# Patient Record
Sex: Female | Born: 1955 | Race: Black or African American | Hispanic: No | Marital: Married | Smoking: Former smoker
Health system: Southern US, Community
[De-identification: ages and names within clinical notes are randomized; demographics above are authoritative.]

## PROBLEM LIST (undated history)

## (undated) DIAGNOSIS — I10 Essential (primary) hypertension: Secondary | ICD-10-CM

## (undated) DIAGNOSIS — I517 Cardiomegaly: Secondary | ICD-10-CM

## (undated) DIAGNOSIS — K469 Unspecified abdominal hernia without obstruction or gangrene: Secondary | ICD-10-CM

## (undated) DIAGNOSIS — C189 Malignant neoplasm of colon, unspecified: Principal | ICD-10-CM

## (undated) DIAGNOSIS — C801 Malignant (primary) neoplasm, unspecified: Secondary | ICD-10-CM

## (undated) DIAGNOSIS — I428 Other cardiomyopathies: Secondary | ICD-10-CM

## (undated) DIAGNOSIS — E049 Nontoxic goiter, unspecified: Secondary | ICD-10-CM

## (undated) DIAGNOSIS — D649 Anemia, unspecified: Secondary | ICD-10-CM

## (undated) HISTORY — DX: Malignant neoplasm of colon, unspecified: C18.9

## (undated) HISTORY — PX: WISDOM TOOTH EXTRACTION: SHX21

---

## 2001-03-16 ENCOUNTER — Encounter: Payer: Self-pay | Admitting: *Deleted

## 2001-03-16 ENCOUNTER — Ambulatory Visit (HOSPITAL_COMMUNITY): Admission: RE | Admit: 2001-03-16 | Discharge: 2001-03-16 | Payer: Self-pay | Admitting: Family Medicine

## 2001-03-16 ENCOUNTER — Emergency Department (HOSPITAL_COMMUNITY): Admission: EM | Admit: 2001-03-16 | Discharge: 2001-03-16 | Payer: Self-pay | Admitting: *Deleted

## 2001-03-16 ENCOUNTER — Inpatient Hospital Stay (HOSPITAL_COMMUNITY): Admission: EM | Admit: 2001-03-16 | Discharge: 2001-03-21 | Payer: Self-pay | Admitting: Emergency Medicine

## 2001-03-16 ENCOUNTER — Encounter: Payer: Self-pay | Admitting: Family Medicine

## 2001-04-05 ENCOUNTER — Encounter: Admission: RE | Admit: 2001-04-05 | Discharge: 2001-07-04 | Payer: Self-pay | Admitting: *Deleted

## 2001-08-21 ENCOUNTER — Encounter: Payer: Self-pay | Admitting: *Deleted

## 2001-08-21 ENCOUNTER — Emergency Department (HOSPITAL_COMMUNITY): Admission: EM | Admit: 2001-08-21 | Discharge: 2001-08-21 | Payer: Self-pay | Admitting: *Deleted

## 2005-08-01 ENCOUNTER — Emergency Department (HOSPITAL_COMMUNITY): Admission: EM | Admit: 2005-08-01 | Discharge: 2005-08-01 | Payer: Self-pay | Admitting: Emergency Medicine

## 2007-10-03 ENCOUNTER — Ambulatory Visit: Payer: Self-pay | Admitting: Internal Medicine

## 2007-10-03 DIAGNOSIS — I509 Heart failure, unspecified: Secondary | ICD-10-CM | POA: Insufficient documentation

## 2007-10-03 DIAGNOSIS — F329 Major depressive disorder, single episode, unspecified: Secondary | ICD-10-CM

## 2007-10-03 DIAGNOSIS — J309 Allergic rhinitis, unspecified: Secondary | ICD-10-CM | POA: Insufficient documentation

## 2007-10-03 DIAGNOSIS — I10 Essential (primary) hypertension: Secondary | ICD-10-CM | POA: Insufficient documentation

## 2007-10-03 DIAGNOSIS — K219 Gastro-esophageal reflux disease without esophagitis: Secondary | ICD-10-CM

## 2007-10-03 DIAGNOSIS — I428 Other cardiomyopathies: Secondary | ICD-10-CM

## 2007-10-03 DIAGNOSIS — M545 Low back pain: Secondary | ICD-10-CM

## 2007-10-03 DIAGNOSIS — E049 Nontoxic goiter, unspecified: Secondary | ICD-10-CM | POA: Insufficient documentation

## 2007-10-03 DIAGNOSIS — J329 Chronic sinusitis, unspecified: Secondary | ICD-10-CM | POA: Insufficient documentation

## 2007-10-03 DIAGNOSIS — F411 Generalized anxiety disorder: Secondary | ICD-10-CM | POA: Insufficient documentation

## 2007-10-03 LAB — CONVERTED CEMR LAB
Blood Glucose, Fingerstick: 114
Creatinine, Urine: 39.7 mg/dL
Hgb A1c MFr Bld: 9.9 %
Microalb, Ur: 0.2 mg/dL (ref 0.00–1.89)

## 2007-10-04 ENCOUNTER — Telehealth (INDEPENDENT_AMBULATORY_CARE_PROVIDER_SITE_OTHER): Payer: Self-pay | Admitting: *Deleted

## 2007-10-04 ENCOUNTER — Encounter (INDEPENDENT_AMBULATORY_CARE_PROVIDER_SITE_OTHER): Payer: Self-pay | Admitting: Internal Medicine

## 2007-10-04 LAB — CONVERTED CEMR LAB
ALT: 15 units/L (ref 0–35)
Alkaline Phosphatase: 61 units/L (ref 39–117)
Basophils Absolute: 0 10*3/uL (ref 0.0–0.1)
CO2: 24 meq/L (ref 19–32)
Cholesterol: 213 mg/dL — ABNORMAL HIGH (ref 0–200)
Eosinophils Relative: 2 % (ref 0–5)
HCT: 40.8 % (ref 36.0–46.0)
LDL Cholesterol: 117 mg/dL — ABNORMAL HIGH (ref 0–99)
Lymphocytes Relative: 48 % — ABNORMAL HIGH (ref 12–46)
Neutro Abs: 4.4 10*3/uL (ref 1.7–7.7)
Neutrophils Relative %: 45 % (ref 43–77)
Platelets: 246 10*3/uL (ref 150–400)
RDW: 13.8 % (ref 11.5–15.5)
Sodium: 143 meq/L (ref 135–145)
Total Bilirubin: 0.3 mg/dL (ref 0.3–1.2)
Total Protein: 7.3 g/dL (ref 6.0–8.3)
VLDL: 24 mg/dL (ref 0–40)

## 2007-11-07 ENCOUNTER — Ambulatory Visit (HOSPITAL_COMMUNITY): Admission: RE | Admit: 2007-11-07 | Discharge: 2007-11-07 | Payer: Self-pay | Admitting: Internal Medicine

## 2007-11-08 ENCOUNTER — Telehealth (INDEPENDENT_AMBULATORY_CARE_PROVIDER_SITE_OTHER): Payer: Self-pay | Admitting: *Deleted

## 2007-11-09 ENCOUNTER — Encounter (INDEPENDENT_AMBULATORY_CARE_PROVIDER_SITE_OTHER): Payer: Self-pay | Admitting: Internal Medicine

## 2007-11-09 ENCOUNTER — Telehealth (INDEPENDENT_AMBULATORY_CARE_PROVIDER_SITE_OTHER): Payer: Self-pay | Admitting: *Deleted

## 2007-11-11 ENCOUNTER — Telehealth (INDEPENDENT_AMBULATORY_CARE_PROVIDER_SITE_OTHER): Payer: Self-pay | Admitting: *Deleted

## 2007-11-11 ENCOUNTER — Encounter (INDEPENDENT_AMBULATORY_CARE_PROVIDER_SITE_OTHER): Payer: Self-pay | Admitting: Internal Medicine

## 2007-11-25 ENCOUNTER — Encounter (INDEPENDENT_AMBULATORY_CARE_PROVIDER_SITE_OTHER): Payer: Self-pay | Admitting: Diagnostic Radiology

## 2007-11-25 ENCOUNTER — Ambulatory Visit (HOSPITAL_COMMUNITY): Admission: RE | Admit: 2007-11-25 | Discharge: 2007-11-25 | Payer: Self-pay | Admitting: Internal Medicine

## 2007-11-25 ENCOUNTER — Encounter (INDEPENDENT_AMBULATORY_CARE_PROVIDER_SITE_OTHER): Payer: Self-pay | Admitting: Internal Medicine

## 2007-11-25 ENCOUNTER — Ambulatory Visit (HOSPITAL_COMMUNITY): Admission: RE | Admit: 2007-11-25 | Discharge: 2007-11-25 | Payer: Self-pay | Admitting: Diagnostic Radiology

## 2008-01-03 ENCOUNTER — Ambulatory Visit: Payer: Self-pay | Admitting: Internal Medicine

## 2008-01-04 ENCOUNTER — Encounter (INDEPENDENT_AMBULATORY_CARE_PROVIDER_SITE_OTHER): Payer: Self-pay | Admitting: Internal Medicine

## 2008-01-06 ENCOUNTER — Encounter (INDEPENDENT_AMBULATORY_CARE_PROVIDER_SITE_OTHER): Payer: Self-pay | Admitting: Internal Medicine

## 2008-02-01 ENCOUNTER — Encounter (INDEPENDENT_AMBULATORY_CARE_PROVIDER_SITE_OTHER): Payer: Self-pay | Admitting: Internal Medicine

## 2008-03-27 ENCOUNTER — Ambulatory Visit: Payer: Self-pay | Admitting: Internal Medicine

## 2008-03-27 LAB — CONVERTED CEMR LAB
Blood Glucose, Fingerstick: 259
Hgb A1c MFr Bld: 8.2 %

## 2008-06-27 ENCOUNTER — Other Ambulatory Visit: Admission: RE | Admit: 2008-06-27 | Discharge: 2008-06-27 | Payer: Self-pay | Admitting: Internal Medicine

## 2008-06-27 ENCOUNTER — Encounter (INDEPENDENT_AMBULATORY_CARE_PROVIDER_SITE_OTHER): Payer: Self-pay | Admitting: Internal Medicine

## 2008-06-27 ENCOUNTER — Ambulatory Visit: Payer: Self-pay | Admitting: Internal Medicine

## 2008-06-27 LAB — CONVERTED CEMR LAB
Blood Glucose, Fingerstick: 197
Hgb A1c MFr Bld: 8.3 %
OCCULT 1: NEGATIVE

## 2008-06-29 ENCOUNTER — Encounter (INDEPENDENT_AMBULATORY_CARE_PROVIDER_SITE_OTHER): Payer: Self-pay | Admitting: Internal Medicine

## 2008-06-29 LAB — CONVERTED CEMR LAB
ALT: 17 units/L (ref 0–35)
AST: 11 units/L (ref 0–37)
Creatinine, Ser: 0.68 mg/dL (ref 0.40–1.20)
Total Bilirubin: 0.3 mg/dL (ref 0.3–1.2)
Total CHOL/HDL Ratio: 2.5
VLDL: 14 mg/dL (ref 0–40)

## 2008-08-03 ENCOUNTER — Encounter (INDEPENDENT_AMBULATORY_CARE_PROVIDER_SITE_OTHER): Payer: Self-pay | Admitting: Internal Medicine

## 2008-08-24 ENCOUNTER — Telehealth (INDEPENDENT_AMBULATORY_CARE_PROVIDER_SITE_OTHER): Payer: Self-pay | Admitting: *Deleted

## 2008-09-26 ENCOUNTER — Ambulatory Visit: Payer: Self-pay | Admitting: Internal Medicine

## 2008-09-26 LAB — CONVERTED CEMR LAB
Blood Glucose, Fingerstick: 241
Hgb A1c MFr Bld: 8.1 %

## 2008-12-31 ENCOUNTER — Ambulatory Visit: Payer: Self-pay | Admitting: Internal Medicine

## 2009-04-02 ENCOUNTER — Ambulatory Visit: Payer: Self-pay | Admitting: Internal Medicine

## 2009-04-02 LAB — CONVERTED CEMR LAB: Hgb A1c MFr Bld: 8 %

## 2009-04-03 ENCOUNTER — Encounter (INDEPENDENT_AMBULATORY_CARE_PROVIDER_SITE_OTHER): Payer: Self-pay | Admitting: Internal Medicine

## 2009-04-03 LAB — CONVERTED CEMR LAB
ALT: 14 units/L (ref 0–35)
AST: 10 units/L (ref 0–37)
Basophils Absolute: 0 10*3/uL (ref 0.0–0.1)
CO2: 24 meq/L (ref 19–32)
Chloride: 103 meq/L (ref 96–112)
Cholesterol: 189 mg/dL (ref 0–200)
Creatinine, Urine: 244.5 mg/dL
Free T4: 1.06 ng/dL (ref 0.80–1.80)
HCT: 38.6 % (ref 36.0–46.0)
LDL Cholesterol: 108 mg/dL — ABNORMAL HIGH (ref 0–99)
Lymphocytes Relative: 49 % — ABNORMAL HIGH (ref 12–46)
Lymphs Abs: 3.7 10*3/uL (ref 0.7–4.0)
Microalb Creat Ratio: 57.2 mg/g — ABNORMAL HIGH (ref 0.0–30.0)
Neutro Abs: 3.3 10*3/uL (ref 1.7–7.7)
Neutrophils Relative %: 43 % (ref 43–77)
Platelets: 228 10*3/uL (ref 150–400)
RDW: 14.3 % (ref 11.5–15.5)
Sodium: 141 meq/L (ref 135–145)
TSH: 1.354 microintl units/mL (ref 0.350–4.500)
Total Bilirubin: 0.3 mg/dL (ref 0.3–1.2)
Total Protein: 6.9 g/dL (ref 6.0–8.3)
VLDL: 16 mg/dL (ref 0–40)
WBC: 7.6 10*3/uL (ref 4.0–10.5)

## 2009-04-17 ENCOUNTER — Encounter (INDEPENDENT_AMBULATORY_CARE_PROVIDER_SITE_OTHER): Payer: Self-pay | Admitting: Internal Medicine

## 2009-05-01 ENCOUNTER — Ambulatory Visit: Payer: Self-pay | Admitting: Internal Medicine

## 2009-05-01 DIAGNOSIS — E1129 Type 2 diabetes mellitus with other diabetic kidney complication: Secondary | ICD-10-CM

## 2009-05-01 DIAGNOSIS — E1165 Type 2 diabetes mellitus with hyperglycemia: Secondary | ICD-10-CM

## 2009-05-01 LAB — CONVERTED CEMR LAB: Blood Glucose, Fingerstick: 209

## 2009-05-29 ENCOUNTER — Ambulatory Visit: Payer: Self-pay | Admitting: Internal Medicine

## 2009-05-29 DIAGNOSIS — L03039 Cellulitis of unspecified toe: Secondary | ICD-10-CM

## 2009-06-18 ENCOUNTER — Encounter (INDEPENDENT_AMBULATORY_CARE_PROVIDER_SITE_OTHER): Payer: Self-pay | Admitting: Internal Medicine

## 2010-11-30 ENCOUNTER — Encounter: Payer: Self-pay | Admitting: Internal Medicine

## 2011-03-24 NOTE — Procedures (Signed)
NAMEDEONDRA, Allen                 ACCOUNT NO.:  [id number]   MEDICAL RECORD NO.:  [id number]          PATIENT TYPE:  OUT   LOCATION:  RAD                           FACILITY:  [facility]   PHYSICIAN:  Darlin Priestly, MD  DATE OF BIRTH:  [birth year]   DATE OF PROCEDURE:  11/25/2007  DATE OF DISCHARGE:                                ECHOCARDIOGRAM   PROCEDURE:  A 2-D echocardiogram.   INDICATIONS:  Monica Allen is a 55 year old female patient of Dr. Jen Mow  and  Dr. Daphene Jaeger with a history of hypertension and cardiomyopathy, now  referred for 2-D echocardiogram to evaluate LV function and valvular  structures.   The aorta is within normal at a 2.7 cm.   The left atrium is mildly enlarged to 4.9 cm.  No clots seen.  The  patient was in sinus rhythm during the procedure.   IVS and PD LV are mildly thickened at 1.4 and 1.2 cm respectfully.   The aortic valve appears to be structurally normal.   The mitral valve appears to be structurally normal.   The tricuspid valve appears to be structurally normal with trivial  tricuspid regurgitation.   Grossly normal pulmonic valve with mild pulmonic regurgitation.   Left ventricular internal dimensions are within normal limits of 4.8 and  3.9 cm trajectory.  There appears to be good overall left ventricular  function with estimated EF of approximately 55% with no segmental wall  motion abnormalities visualized.   Mildly dilated right ventricle with normal RVF function.   CONCLUSION:  1. Mild concentric LVH with normal LV systolic function.  2. Normal LV size and systolic function, estimated EF of 55%, with no      segmental wall motion abnormalities visualized.  3. Structurally normal aortic valve.  4. Resection of the mitral valve.  5. Structurally normal tricuspid valve with trivial tricuspid      regurgitation.  6. Mild pulmonic regurgitation.  7. Left atrial enlargement.  8. Mildly dilated right ventricle with normal RV systolic  function.      Darlin Priestly, MD  Electronically Signed     RHM/MEDQ  D:  11/25/2007  T:  11/25/2007  Job:  [id number]

## 2011-03-27 NOTE — Cardiovascular Report (Signed)
[place]. [facility]  Patient:    Monica Allen, Monica Allen                        MRN: [id number] Proc. Date: 03/18/01 Adm. Date:  [id number] Attending:  Virgina Evener CC:         John Giovanni, M.D.  Wilson Singer (office of Dr. Tresa Endo)   Cardiac Catheterization  PROCEDURE:  Left and right heart catheterization.  INDICATIONS:  Ms. Jillane Po is a 55 year old obese black female who was hospitalized with increasing shortness of breath.  Upon presentation to [facility], her blood pressure was 230/130.  A chest CT was negative for pulmonary embolism but showed cardiac enlargement.  She was hospitalized at [facility].  She was initially treated with IV labetalol as well as IV Lasix and begun on ACE inhibitor.  An echocardiogram suggested an ejection fraction in the 15-20% range.  She is now referred for definitive right and left heart catheterization.  PROCEDURE:  Right and left heart catheterization:  Cine angiography; biplane cine left ventriculography; Swan-Ganz catheterization, cardiac output determination by the Fick and thermodilution method; distal aortography.  HEMODYNAMIC DATA:  Right atrial pressure 24, right ventricular pressure 70/20. Pulmonary artery pressure 70/48-50.  Mean capillary wedge pressure is 30. Central aortic pressure was 167/109.  Left ventricular pressure was 167/30.  The O2 saturation in the left ventricle was 95%, in the pulmonary artery was 75%.  Using a hemoglobin of 14.9, cardiac output by the Fick method was 7.3 liters per minute and cardiac index was 3.2 liters per m sq.  By the thermodilution method, cardiac output was 7.3 liters per minute and the index was 3.2 liters per minute per m sq.  ANGIOGRAPHIC DATA:  The left main coronary artery was normal vessel that bifurcated into an LAD and left circumflex system.  The LAD was a large vessel that gave rise to two prominent diagonal vessels and several  septal perforator arteries and was angiographically normal.  The circumflex vessel was angiographically normal and was a dominant vessel ending in the PDA and posterior lateral system.  The right coronary artery was an angiographically normal vessel that ended in the small PD, acute margin and PL vessel.  Biplane cine left ventriculography revealed a dilated left ventricle.  There was severe global hypocontractility.  Ejection fraction is 20-25%.  Distal aortography was done.  There was no evidence for renal artery stenosis. There was no significant aortoiliac disease.  IMPRESSION: 1. Severe dilated nonischemic cardiomyopathy (ejection fraction 20-25%) 2. Normal coronary arteries. DD:  03/18/01 TD:  03/19/01 Job: [id number]

## 2011-10-11 ENCOUNTER — Encounter: Payer: Self-pay | Admitting: *Deleted

## 2011-10-11 ENCOUNTER — Emergency Department (HOSPITAL_COMMUNITY)
Admission: EM | Admit: 2011-10-11 | Discharge: 2011-10-11 | Disposition: A | Discharge status: Home / Self Care | Payer: BC Managed Care – PPO | Attending: Emergency Medicine | Admitting: Emergency Medicine

## 2011-10-11 ENCOUNTER — Emergency Department (HOSPITAL_COMMUNITY): Payer: BC Managed Care – PPO

## 2011-10-11 DIAGNOSIS — E119 Type 2 diabetes mellitus without complications: Secondary | ICD-10-CM | POA: Insufficient documentation

## 2011-10-11 DIAGNOSIS — R1013 Epigastric pain: Secondary | ICD-10-CM | POA: Insufficient documentation

## 2011-10-11 DIAGNOSIS — I10 Essential (primary) hypertension: Secondary | ICD-10-CM | POA: Insufficient documentation

## 2011-10-11 DIAGNOSIS — K859 Acute pancreatitis without necrosis or infection, unspecified: Secondary | ICD-10-CM

## 2011-10-11 DIAGNOSIS — I517 Cardiomegaly: Secondary | ICD-10-CM | POA: Insufficient documentation

## 2011-10-11 DIAGNOSIS — Z79899 Other long term (current) drug therapy: Secondary | ICD-10-CM | POA: Insufficient documentation

## 2011-10-11 DIAGNOSIS — Z87891 Personal history of nicotine dependence: Secondary | ICD-10-CM | POA: Insufficient documentation

## 2011-10-11 DIAGNOSIS — Z7982 Long term (current) use of aspirin: Secondary | ICD-10-CM | POA: Insufficient documentation

## 2011-10-11 HISTORY — DX: Essential (primary) hypertension: I10

## 2011-10-11 HISTORY — DX: Cardiomegaly: I51.7

## 2011-10-11 LAB — COMPREHENSIVE METABOLIC PANEL
AST: 13 U/L (ref 0–37)
CO2: 29 mEq/L (ref 19–32)
Calcium: 9.5 mg/dL (ref 8.4–10.5)
Chloride: 94 mEq/L — ABNORMAL LOW (ref 96–112)
Creatinine, Ser: 0.57 mg/dL (ref 0.50–1.10)
GFR calc Af Amer: >90 mL/min (ref >90)
GFR calc non Af Amer: >90 mL/min (ref >90)
Glucose, Bld: 258 mg/dL — ABNORMAL HIGH (ref 70–99)
Total Bilirubin: 0.3 mg/dL (ref 0.3–1.2)

## 2011-10-11 LAB — DIFFERENTIAL
Basophils Absolute: 0 10*3/uL (ref 0.0–0.1)
Eosinophils Relative: 2 % (ref 0–5)
Lymphocytes Relative: 41 % (ref 12–46)
Lymphs Abs: 3.1 10*3/uL (ref 0.7–4.0)
Monocytes Absolute: 0.5 10*3/uL (ref 0.1–1.0)
Monocytes Relative: 7 % (ref 3–12)
Neutro Abs: 3.8 10*3/uL (ref 1.7–7.7)

## 2011-10-11 LAB — CBC
HCT: 41.4 % (ref 36.0–46.0)
Hemoglobin: 13.7 g/dL (ref 12.0–15.0)
MCV: 79.5 fL (ref 78.0–100.0)
RBC: 5.21 MIL/uL — ABNORMAL HIGH (ref 3.87–5.11)
WBC: 7.5 10*3/uL (ref 4.0–10.5)

## 2011-10-11 MED ORDER — ONDANSETRON HCL 4 MG/2ML IJ SOLN: 4.0000 mg | Freq: Once | INTRAMUSCULAR | Status: DC

## 2011-10-11 MED ORDER — ONDANSETRON 4 MG PO TBDP: 4.0000 mg | ORAL_TABLET | Freq: Three times a day (TID) | ORAL | Status: AC | PRN

## 2011-10-11 MED ORDER — IOHEXOL 300 MG/ML  SOLN
100.0000 mL | Freq: Once | INTRAMUSCULAR | Status: AC | PRN
  Administered 2011-10-11: 100 mL via INTRAVENOUS

## 2011-10-11 MED ORDER — SODIUM CHLORIDE 0.9 % IV SOLN: Freq: Once | INTRAVENOUS | Status: DC

## 2011-10-11 MED ORDER — HYDROMORPHONE HCL PF 1 MG/ML IJ SOLN: 1.0000 mg | Freq: Once | INTRAMUSCULAR | Status: DC

## 2011-10-11 MED ORDER — HYDROCODONE-ACETAMINOPHEN 5-325 MG PO TABS: 1.0000 | ORAL_TABLET | Freq: Four times a day (QID) | ORAL | Status: AC | PRN

## 2011-10-11 NOTE — ED Provider Notes (Signed)
History     CSN: [id number] Arrival date & time: 10/11/2011  6:15 PM   First MD Initiated Contact with Patient 10/11/11 1816      Chief Complaint  Patient presents with  . Abdominal Pain    (Consider location/radiation/quality/duration/timing/severity/associated sxs/prior treatment) Patient is a 55 y.o. female presenting with abdominal pain. The history is provided by the patient (The patient complains of epigastric abdominal pain for a few days and some vomiting for 2 days no fevers chills no blood in her vomit).  Abdominal Pain The primary symptoms of the illness include abdominal pain. The primary symptoms of the illness do not include fever, fatigue, shortness of breath or diarrhea. The current episode started more than 2 days ago. The onset of the illness was sudden.  Associated with: nothing. The patient states that she believes she is currently not pregnant. The patient has not had a change in bowel habit. Symptoms associated with the illness do not include chills, anorexia, diaphoresis, heartburn, hematuria, frequency or back pain. Significant associated medical issues include diabetes. Significant associated medical issues do not include PUD, GERD, inflammatory bowel disease or gallstones.    Past Medical History  Diagnosis Date  . Diabetes mellitus   . Hypertension   . Enlarged heart     History reviewed. No pertinent past surgical history.  History reviewed. No pertinent family history.  History  Substance Use Topics  . Smoking status: Former Games developer  . Smokeless tobacco: Not on file  . Alcohol Use: Yes     occasionally    OB History    Grav Para Term Preterm Abortions TAB SAB Ect Mult Living                  Review of Systems  Constitutional: Negative for fever, chills, diaphoresis and fatigue.  HENT: Negative for congestion, sinus pressure and ear discharge.   Eyes: Negative for discharge.  Respiratory: Negative for cough and shortness of breath.     Cardiovascular: Negative for chest pain.  Gastrointestinal: Positive for abdominal pain. Negative for heartburn, diarrhea and anorexia.  Genitourinary: Negative for frequency and hematuria.  Musculoskeletal: Negative for back pain.  Skin: Negative for rash.  Neurological: Negative for seizures and headaches.  Hematological: Negative.   Psychiatric/Behavioral: Negative for hallucinations.    Allergies  Review of patient's allergies indicates no known allergies.  Home Medications   Current Outpatient Rx  Name Route Sig Dispense Refill  . ASPIRIN EC 81 MG PO TBEC Oral Take 81 mg by mouth daily.      . ASPIRIN EFFERVESCENT 325 MG PO TBEF Oral Take 325 mg by mouth once as needed. For relief     . CALCIUM CARBONATE ANTACID 500 MG PO CHEW Oral Chew 1 tablet by mouth daily as needed. For upset stomach relief     . CARVEDILOL PHOSPHATE ER 80 MG PO CP24 Oral Take 80 mg by mouth daily.      . FUROSEMIDE 80 MG PO TABS Oral Take 80 mg by mouth daily.      Marland Kitchen GLIPIZIDE 10 MG PO TABS Oral Take 10 mg by mouth daily.      Marland Kitchen GLIPIZIDE PO Oral Take 1 tablet by mouth 2 (two) times daily.      Marland Kitchen METFORMIN HCL 500 MG PO TABS Oral Take 1,000 mg by mouth 2 (two) times daily.      Marland Kitchen POTASSIUM CHLORIDE CRYS CR 20 MEQ PO TBCR Oral Take 20 mEq by mouth daily.      Marland Kitchen  RAMIPRIL 10 MG PO TABS Oral Take 10 mg by mouth daily.      Marland Kitchen VALSARTAN 320 MG PO TABS Oral Take 320 mg by mouth daily.      Marland Kitchen HYDROCODONE-ACETAMINOPHEN 5-325 MG PO TABS Oral Take 1 tablet by mouth every 6 (six) hours as needed for pain. 20 tablet 0  . ONDANSETRON 4 MG PO TBDP Oral Take 1 tablet (4 mg total) by mouth every 8 (eight) hours as needed for nausea. 20 tablet 0    BP 159/79  Pulse 82  Temp(Src) 98.4 F (36.9 C) (Oral)  Resp 19  Ht 5' 4.5" (1.638 m)  Wt 300 lb (136.079 kg)  BMI 50.70 kg/m2  SpO2 98%  LMP 10/02/2011  Physical Exam  Constitutional: She is oriented to person, place, and time. She appears well-developed.  HENT:   Head: Normocephalic and atraumatic.  Eyes: Conjunctivae and EOM are normal. No scleral icterus.  Neck: Neck supple. No thyromegaly present.  Cardiovascular: Normal rate and regular rhythm.  Exam reveals no gallop and no friction rub.   No murmur heard. Pulmonary/Chest: No stridor. She has no wheezes. She has no rales. She exhibits no tenderness.  Abdominal: She exhibits no distension. There is tenderness. There is no rebound.       Mild tendernous to epigastric  Musculoskeletal: Normal range of motion. She exhibits no edema.  Lymphadenopathy:    She has no cervical adenopathy.  Neurological: She is oriented to person, place, and time. Coordination normal.  Skin: No rash noted. No erythema.  Psychiatric: She has a normal mood and affect. Her behavior is normal.    ED Course  Procedures (including critical care time)  Labs Reviewed  CBC - Abnormal; Notable for the following:    RBC 5.21 (*)    All other components within normal limits  COMPREHENSIVE METABOLIC PANEL - Abnormal; Notable for the following:    Sodium 130 (*)    Chloride 94 (*)    Glucose, Bld 258 (*)    All other components within normal limits  DIFFERENTIAL  LIPASE, BLOOD  URINALYSIS, ROUTINE W REFLEX MICROSCOPIC   Ct Abdomen Pelvis W Contrast  10/11/2011  *RADIOLOGY REPORT*  Clinical Data: Epigastric abdominal pain, nausea and vomiting.  CT ABDOMEN AND PELVIS WITH CONTRAST  Technique:  Multidetector CT imaging of the abdomen and pelvis was performed following the standard protocol during bolus administration of intravenous contrast.  Contrast: OMNIPAQUE IOHEXOL 300 MG/ML IV SOLN  Comparison: None.  Findings: Minimal perihilar opacity within the left lower lobe, and minimal right basilar opacity, likely reflect atelectasis, though the perihilar distribution is slightly unusual, and mild pneumonia cannot be entirely excluded.  The liver and spleen are unremarkable in appearance.  The gallbladder is partially  decompressed and is within normal limits. The adrenal glands are unremarkable in appearance.  Note is made of mild focal soft tissue inflammation about the head of the pancreas.  This is very localized, and raises concern for mild acute pancreatitis.  There is no evidence of devascularization or pseudocyst formation.  The remainder of the pancreas is unremarkable in appearance.  Mild bilateral pelvicaliectasis is noted, without evidence of significant hydronephrosis.  No obstructing ureteral stones are seen.  No renal stones are identified.  There is minimal left renal scarring.  The kidneys are otherwise unremarkable in appearance. No perinephric stranding is appreciated.  No free fluid is identified.  The small bowel is unremarkable in appearance.  The stomach is within normal limits.  No acute vascular abnormalities are seen.  Scattered calcification is noted along the abdominal aorta and its branches.  The appendix is normal in caliber and contains air and stool, without evidence for appendicitis.  The colon is unremarkable in appearance.  The bladder is mildly distended and within normal limits.  The uterus is grossly unremarkable in appearance.  The ovaries are relatively symmetric; no suspicious adnexal masses are seen.  No inguinal lymphadenopathy is seen.  No acute osseous abnormalities are identified.  Sclerotic change is noted at the sacroiliac joints and at the pubic symphysis.  IMPRESSION:  1.  Mild acute pancreatitis involving the head of the pancreas. This demonstrates only mild soft tissue inflammation, without evidence for fluid; no devascularization or pseudocyst formation seen. 2.  Minimal left lower lobe perihilar airspace opacity, and minimal right basilar opacity, likely reflect atelectasis, though the distribution of the perihilar opacity is slightly unusual, and pneumonia cannot be entirely excluded. 3.  Scattered calcification along the abdominal aorta and its branches.  Original Report  Authenticated By: Tonia Ghent, M.D.     1. Pancreatitis       MDM  Pancreatitis,  Pt had no pain at discharge.  She is to be on liquids and follow up in 2 days        Benny Lennert, MD 10/11/11 2206

## 2011-10-11 NOTE — ED Notes (Signed)
Pt c/o epigastric pain with n/v x 4 days

## 2011-10-11 NOTE — ED Notes (Signed)
Pt c/o epigastric pain described as cramp like. Pt states she has had pain for 4 days. Pt states she recently came off her period and attributed pain to that. Pt states she has had n/v but denies diarrhea.

## 2011-11-10 DIAGNOSIS — C801 Malignant (primary) neoplasm, unspecified: Secondary | ICD-10-CM

## 2011-11-10 HISTORY — DX: Malignant (primary) neoplasm, unspecified: C80.1

## 2012-10-26 ENCOUNTER — Emergency Department (HOSPITAL_COMMUNITY)
Admission: EM | Admit: 2012-10-26 | Discharge: 2012-10-27 | Disposition: A | Discharge status: Home / Self Care | Payer: BC Managed Care – PPO | Attending: Emergency Medicine | Admitting: Emergency Medicine

## 2012-10-26 ENCOUNTER — Encounter (HOSPITAL_COMMUNITY): Payer: Self-pay | Admitting: Emergency Medicine

## 2012-10-26 ENCOUNTER — Emergency Department (HOSPITAL_COMMUNITY): Payer: BC Managed Care – PPO

## 2012-10-26 DIAGNOSIS — Z79899 Other long term (current) drug therapy: Secondary | ICD-10-CM | POA: Insufficient documentation

## 2012-10-26 DIAGNOSIS — I10 Essential (primary) hypertension: Secondary | ICD-10-CM | POA: Insufficient documentation

## 2012-10-26 DIAGNOSIS — Z87891 Personal history of nicotine dependence: Secondary | ICD-10-CM | POA: Insufficient documentation

## 2012-10-26 DIAGNOSIS — K5900 Constipation, unspecified: Secondary | ICD-10-CM | POA: Insufficient documentation

## 2012-10-26 DIAGNOSIS — N838 Other noninflammatory disorders of ovary, fallopian tube and broad ligament: Secondary | ICD-10-CM

## 2012-10-26 DIAGNOSIS — E119 Type 2 diabetes mellitus without complications: Secondary | ICD-10-CM | POA: Insufficient documentation

## 2012-10-26 DIAGNOSIS — I517 Cardiomegaly: Secondary | ICD-10-CM | POA: Insufficient documentation

## 2012-10-26 DIAGNOSIS — R142 Eructation: Secondary | ICD-10-CM | POA: Insufficient documentation

## 2012-10-26 DIAGNOSIS — Z7982 Long term (current) use of aspirin: Secondary | ICD-10-CM | POA: Insufficient documentation

## 2012-10-26 DIAGNOSIS — R143 Flatulence: Secondary | ICD-10-CM | POA: Insufficient documentation

## 2012-10-26 DIAGNOSIS — R141 Gas pain: Secondary | ICD-10-CM | POA: Insufficient documentation

## 2012-10-26 LAB — CBC WITH DIFFERENTIAL/PLATELET
Basophils Absolute: 0 10*3/uL (ref 0.0–0.1)
Eosinophils Absolute: 0.2 10*3/uL (ref 0.0–0.7)
HCT: 34.9 % — ABNORMAL LOW (ref 36.0–46.0)
Lymphs Abs: 2.7 10*3/uL (ref 0.7–4.0)
MCHC: 32.4 g/dL (ref 30.0–36.0)
MCV: 74.7 fL — ABNORMAL LOW (ref 78.0–100.0)
Monocytes Absolute: 0.6 10*3/uL (ref 0.1–1.0)
Neutro Abs: 5.1 10*3/uL (ref 1.7–7.7)
Platelets: 267 10*3/uL (ref 150–400)
RDW: 13.7 % (ref 11.5–15.5)

## 2012-10-26 LAB — COMPREHENSIVE METABOLIC PANEL
Albumin: 3.1 g/dL — ABNORMAL LOW (ref 3.5–5.2)
Alkaline Phosphatase: 62 U/L (ref 39–117)
BUN: 8 mg/dL (ref 6–23)
Calcium: 9.2 mg/dL (ref 8.4–10.5)
Creatinine, Ser: 0.65 mg/dL (ref 0.50–1.10)
GFR calc Af Amer: >90 mL/min (ref >90)
Glucose, Bld: 321 mg/dL — ABNORMAL HIGH (ref 70–99)
Total Protein: 7.2 g/dL (ref 6.0–8.3)

## 2012-10-26 LAB — URINALYSIS, ROUTINE W REFLEX MICROSCOPIC
Glucose, UA: >1000 mg/dL — AB
Ketones, ur: 15 mg/dL — AB
Leukocytes, UA: NEGATIVE
Protein, ur: NEGATIVE mg/dL
Urobilinogen, UA: 0.2 mg/dL (ref 0.0–1.0)

## 2012-10-26 LAB — URINE MICROSCOPIC-ADD ON

## 2012-10-26 LAB — LIPASE, BLOOD: Lipase: 17 U/L (ref 11–59)

## 2012-10-26 NOTE — ED Notes (Signed)
Pt c/o epigastric/abdominal pain.  Abdomen is distended.  Denies N/V/D.  States it hurst when she eats.

## 2012-10-27 MED ORDER — TRAMADOL HCL 50 MG PO TABS: 50.0000 mg | ORAL_TABLET | Freq: Four times a day (QID) | ORAL | Status: DC | PRN

## 2012-10-27 MED ORDER — GLIPIZIDE 10 MG PO TABS
10.0000 mg | ORAL_TABLET | Freq: Once | ORAL | Status: AC
  Administered 2012-10-27: 10 mg via ORAL
  Filled 2012-10-27: qty 1

## 2012-10-27 MED ORDER — IOHEXOL 300 MG/ML  SOLN
120.0000 mL | Freq: Once | INTRAMUSCULAR | Status: AC | PRN
  Administered 2012-10-27: 120 mL via INTRAVENOUS

## 2012-10-27 MED ORDER — GLIPIZIDE 5 MG PO TABS
ORAL_TABLET | ORAL | Status: AC
  Filled 2012-10-27: qty 2

## 2012-10-27 NOTE — ED Provider Notes (Signed)
Medical screening examination/treatment/procedure(s) were conducted as a shared visit with non-physician practitioner(s) and myself.  I personally evaluated the patient during the encounter  Please see my separate respective documentation pertaining to this patient encounter   Vida Roller, MD 10/27/12 [phone]

## 2012-10-27 NOTE — ED Provider Notes (Signed)
56 year old female who is obese who presents with gradually worsening gradual onset, significant swelling of her abdomen and pelvis. She denies associated dysuria but does have mild constipation. She has not had any fevers, chills, cough or shortness of breath. She follows with a family Dr. In the community at [employer] denies weight loss, change in appetite.  On exam the patient has a soft abdomen but she does have a firm mass in her lower abdomen in her periumbilical and pelvic regions. She has minimal tenderness in this area.  Lungs are clear, heart is regular without tachycardia, no peripheral edema.  I suspect that the patient has an abdominal mass, CT scan ordered.  CT scan interpretation shows that the patient does have an ovarian pathology, this is possibly cancerous and the patient will need referral to gynecology oncology. This can happen at her primary Dr. Isidore Moos level as she does follow very closely with her family Dr. She does not appear to be in acute distress at this time, she has been made aware of these findings by the physician assistant Idol PA-C  Medical screening examination/treatment/procedure(s) were conducted as a shared visit with non-physician practitioner(s) and myself.  I personally evaluated the patient during the encounter    Vida Roller, MD 10/27/12 [phone]

## 2012-10-27 NOTE — ED Provider Notes (Signed)
History     CSN: [id number]  Arrival date & time 10/26/12  1929   First MD Initiated Contact with Patient 10/26/12 2149      Chief Complaint  Patient presents with  . Abdominal Pain    (Consider location/radiation/quality/duration/timing/severity/associated sxs/prior treatment) HPI Comments: Monica Allen presents with slowly progressive abdominal distention (over the past several months)  and increasing but intermittent pressure in her upper abdomen which is worse after she eats. She denies nausea, vomiting but has had increased constipation along with abdominal bloating and increased gas,  Which is not improved with Gas X.  She has noticed increased abdominal girth yet has noticed weight loss of 20+ pounds over the past several months.  She is pain free usually, but is uncomfortably full after eating just a small meal.  Past medical history is significant for diabetes and hypertension.  She has been having irregular periods,  Her last occuring over 3 months ago prior to her current active menses.  The history is provided by the patient and the spouse.    Past Medical History  Diagnosis Date  . Diabetes mellitus   . Hypertension   . Enlarged heart     History reviewed. No pertinent past surgical history.  No family history on file.  History  Substance Use Topics  . Smoking status: Former Games developer  . Smokeless tobacco: Not on file  . Alcohol Use: Yes     Comment: occasionally    OB History    Grav Para Term Preterm Abortions TAB SAB Ect Mult Living                  Review of Systems  Constitutional: Positive for appetite change and unexpected weight change. Negative for fever and chills.  HENT: Negative for congestion, sore throat and neck pain.   Eyes: Negative.   Respiratory: Negative for chest tightness and shortness of breath.   Cardiovascular: Negative for chest pain.  Gastrointestinal: Positive for constipation and abdominal distention. Negative for nausea,  vomiting, abdominal pain and diarrhea.  Genitourinary: Negative.   Musculoskeletal: Negative for joint swelling and arthralgias.  Skin: Negative.  Negative for rash and wound.  Neurological: Negative for dizziness, weakness, light-headedness, numbness and headaches.  Hematological: Negative.   Psychiatric/Behavioral: Negative.     Allergies  Review of patient's allergies indicates no known allergies.  Home Medications   Current Outpatient Rx  Name  Route  Sig  Dispense  Refill  . ASPIRIN EC 81 MG PO TBEC   Oral   Take 81 mg by mouth daily.           . ASPIRIN EFFERVESCENT 325 MG PO TBEF   Oral   Take 325 mg by mouth once as needed. For relief          . CALCIUM CARBONATE ANTACID 500 MG PO CHEW   Oral   Chew 1 tablet by mouth daily as needed. For upset stomach relief         . CARVEDILOL PHOSPHATE ER 80 MG PO CP24   Oral   Take 80 mg by mouth daily.           . FUROSEMIDE 80 MG PO TABS   Oral   Take 80 mg by mouth daily.           Marland Kitchen GLIPIZIDE 10 MG PO TABS   Oral   Take 10 mg by mouth 2 (two) times daily.          Marland Kitchen  LINAGLIPTIN-METFORMIN HCL 2.03-999 MG PO TABS   Oral   Take 1 tablet by mouth 2 (two) times daily.         Marland Kitchen MAGNESIUM CITRATE PO SOLN   Oral   Take 1 Bottle by mouth once.         Marland Kitchen POTASSIUM CHLORIDE CRYS ER 20 MEQ PO TBCR   Oral   Take 20 mEq by mouth daily.           Marland Kitchen RAMIPRIL 10 MG PO TABS   Oral   Take 10 mg by mouth daily.           Marland Kitchen SIMETHICONE 125 MG PO CAPS   Oral   Take 1 capsule by mouth daily as needed. For relief         . VALSARTAN 320 MG PO TABS   Oral   Take 320 mg by mouth daily.           . TRAMADOL HCL 50 MG PO TABS   Oral   Take 1 tablet (50 mg total) by mouth every 6 (six) hours as needed for pain.   20 tablet   0     BP 123/68  Pulse 85  Temp 97.8 F (36.6 C) (Oral)  Resp 20  Ht 5' 2.5" (1.588 m)  Wt 270 lb (122.471 kg)  BMI 48.60 kg/m2  SpO2 98%  LMP 10/19/2012  Physical  Exam  Nursing note and vitals reviewed. Constitutional: She appears well-developed and well-nourished.  HENT:  Head: Normocephalic and atraumatic.  Eyes: Conjunctivae normal are normal.  Neck: Normal range of motion.  Cardiovascular: Normal rate, regular rhythm, normal heart sounds and intact distal pulses.   Pulmonary/Chest: Effort normal and breath sounds normal. She has no wheezes.  Abdominal: Soft. Bowel sounds are normal. She exhibits distension and mass. She exhibits no pulsatile midline mass. There is no hepatosplenomegaly. There is tenderness in the epigastric area and left upper quadrant. There is no rebound and no guarding.  Musculoskeletal: Normal range of motion.  Neurological: She is alert.  Skin: Skin is warm and dry.  Psychiatric: She has a normal mood and affect.    ED Course  Procedures (including critical care time)  Labs Reviewed  URINALYSIS, ROUTINE W REFLEX MICROSCOPIC - Abnormal; Notable for the following:    Specific Gravity, Urine >1.030 (*)     Glucose, UA >1000 (*)     Hgb urine dipstick MODERATE (*)     Ketones, ur 15 (*)     All other components within normal limits  CBC WITH DIFFERENTIAL - Abnormal; Notable for the following:    Hemoglobin 11.3 (*)     HCT 34.9 (*)     MCV 74.7 (*)     MCH 24.2 (*)     All other components within normal limits  COMPREHENSIVE METABOLIC PANEL - Abnormal; Notable for the following:    Sodium 133 (*)     Chloride 95 (*)     Glucose, Bld 321 (*)     Albumin 3.1 (*)     All other components within normal limits  URINE MICROSCOPIC-ADD ON - Abnormal; Notable for the following:    Squamous Epithelial / LPF FEW (*)     All other components within normal limits  LIPASE, BLOOD  PREGNANCY, URINE   Ct Abdomen Pelvis W Contrast  10/27/2012  *RADIOLOGY REPORT*  Clinical Data: Abdominal pain and distension.  CT ABDOMEN AND PELVIS WITH CONTRAST  Technique:  Multidetector CT imaging  of the abdomen and pelvis was performed  following the standard protocol during bolus administration of intravenous contrast.  Contrast: OMNIPAQUE IOHEXOL 300 MG/ML  SOLN  Comparison: 10/11/2011  Findings: Limited images through the lung bases demonstrate no significant appreciable abnormality. The heart size is within normal limits. No pleural or pericardial effusion.  Low attenuation of the liver suggests fatty infiltration. Unremarkable biliary system, spleen, pancreas, adrenal glands.  Symmetric renal enhancement.  Too small further characterize hypodensity interpolar left kidney.  No hydronephrosis or hydroureter.  No CT evidence for colitis.  Appendix within normal limits.  No bowel obstruction.  No free intraperitoneal air.  Interval development of a 19 x 23 x 19 cm right ovarian mass; predominately cystic and thick, nodular internal septations. There is a small amount of free intraperitoneal fluid within the abdomen and pelvis.  Lymphadenopathy.  As index, an aortocaval lymph node measuring 15 mm short axis and a left para-aortic node posterior to the left renal vein measuring 14 mm short axis.  There are enlarged mesenteric nodes as well.  As index measuring 13 mm on image 40 anterior to the right kidney.  Clustered nodes along the ileocolic chain appear subcentimeter however disease involvement not excluded.  Unremarkable appearance to the uterus and left adnexa.  There is scattered atherosclerotic calcification of the aorta and its branches. No aneurysmal dilatation.  Adenopathy results in mass effect on the left renal vein however the vessel remains patent.  The dominant mass results in mass effect on the IVC which becomes slit-like just superior to the confluence, however remains patent.  Multilevel degenerative changes of the imaged spine. No acute or aggressive appearing osseous lesion.  IMPRESSION:  19 x 23 x 19 cm cystic right ovarian mass with internal thick enhancing septations.  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   The presence of peritoneal fluid and lymphadenopathy is concerning for spread of disease.   Original Report Authenticated By: Jearld Lesch, M.D.      1. Ovarian mass       MDM  Patients labs and/or radiological studies were reviewed during the medical decision making and disposition process. Discussed results with patient.  She will contact her pcp in the morning to get an urgent referral to a gyn oncologist.  Husband and dg at bedside and questions answered.  Prescription for tramadol given, advised to use sparingly as can cause constipation.  Also, pt with hyperglycemia,  States she has not taken her DM meds today.  Given her evening glipizide,  To hold her metformin due to todays IV contrast.  Encouraged to keep close watch on her cbg's.       Burgess Amor, PA 10/27/12 [phone]

## 2012-11-23 ENCOUNTER — Encounter: Payer: Self-pay | Admitting: Gynecology

## 2012-11-23 ENCOUNTER — Ambulatory Visit: Payer: BC Managed Care – PPO | Attending: Gynecology | Admitting: Gynecology

## 2012-11-23 VITALS — BP 122/70 | HR 80 | Temp 99.0°F | Resp 22 | Ht 63.58 in | Wt 287.8 lb

## 2012-11-23 DIAGNOSIS — Z79899 Other long term (current) drug therapy: Secondary | ICD-10-CM | POA: Insufficient documentation

## 2012-11-23 DIAGNOSIS — R599 Enlarged lymph nodes, unspecified: Secondary | ICD-10-CM | POA: Insufficient documentation

## 2012-11-23 DIAGNOSIS — E119 Type 2 diabetes mellitus without complications: Secondary | ICD-10-CM | POA: Insufficient documentation

## 2012-11-23 DIAGNOSIS — I10 Essential (primary) hypertension: Secondary | ICD-10-CM | POA: Insufficient documentation

## 2012-11-23 DIAGNOSIS — R1909 Other intra-abdominal and pelvic swelling, mass and lump: Secondary | ICD-10-CM | POA: Insufficient documentation

## 2012-11-23 DIAGNOSIS — R971 Elevated cancer antigen 125 [CA 125]: Secondary | ICD-10-CM | POA: Insufficient documentation

## 2012-11-23 DIAGNOSIS — Z6841 Body Mass Index (BMI) 40.0 and over, adult: Secondary | ICD-10-CM | POA: Insufficient documentation

## 2012-11-23 DIAGNOSIS — Z7982 Long term (current) use of aspirin: Secondary | ICD-10-CM | POA: Insufficient documentation

## 2012-11-23 DIAGNOSIS — R1900 Intra-abdominal and pelvic swelling, mass and lump, unspecified site: Secondary | ICD-10-CM

## 2012-11-23 DIAGNOSIS — N83209 Unspecified ovarian cyst, unspecified side: Secondary | ICD-10-CM

## 2012-11-23 NOTE — Patient Instructions (Signed)
Surgery is scheduled for 12/13/2012 at [facility] a long [facility]

## 2012-11-23 NOTE — Progress Notes (Signed)
Consult Note: Gyn-Onc   Monica Allen 57 y.o. female  Chief Complaint  Patient presents with  . Pelvic mass    New patient    Interval History:   HPI: 57 year old African American female seen in consultation at the request of Dr. Despina Hidden regarding management of a newly diagnosed large abdominal pelvic mass. Patient reports that she's had increasing abdominal pressure will last several months and then increasing pain. Initial evaluation on December 31 revealed a large complex cystic mass with multiple septations and internal flow. The mass measures 34 x 24 x 21 cm. CT scan was also obtained revealing the following results:  19 x 23 x 19 cm cystic right ovarian mass with internal thick enhancing septations. This is concerning for a cystic ovarian  neoplasm. The presence of peritoneal fluid and lymphadenopathy is concerning for spread of disease. CA 125 was 64.7 units per mL. The patient denies any past gynecologic history. She is not on any hormone replacement.  She has not had any abdominal or pelvic surgery in the past.  Review of Systems:10 point review of systems is negative as noted above.   Vitals: Blood pressure 122/70, pulse 80, temperature 99 F (37.2 C), temperature source Oral, resp. rate 22, height 5' 3.58" (1.615 m), weight 287 lb 12.8 oz (130.545 kg), last menstrual period 10/19/2012.  Physical Exam: General : The patient is a healthy woman in no acute distress.  HEENT: normocephalic, extraoccular movements normal; neck is supple without thyromegally  Lynphnodes: Supraclavicular and inguinal nodes not enlarged  Abdomen:  Massively distended and obese. There is dullness to percussion throughout the entire abdomen. She is so is distended but is difficult to outline any specific mass. Pelvic:  EGBUS: Normal female  Vagina: Normal, no lesions  Urethra and Bladder: Normal, non-tender  Cervix: Normal  Uterus: Unable to outline due to patient's obesity and abdominal distention.    Bi-manual examination: Non-tender; no adenxal masses or nodularity  Rectal: normal sphincter tone, no masses, no blood  Lower extremities: No edema or varicosities. Normal range of motion    Assessment/Plan: Large abdominal pelvic mass with septations, internal flow, and elevated CA 125. In addition the patient has some ascites and adenopathy all concerning for ovarian cancer.  I recommend the patient undergo resection of the mass through midline laparotomy incision and surgical staging. Given the patient's morbid obesity (BMI of 50) complete staging including lymphadenectomy may not be possible.  The patient was offered the opportunity of surgery at [facility] on January 28 or at [facility] on February 4. She elected to have surgery at [facility]. We'll arrange preoperative evaluation by anesthesia. The risks of surgery were discussed with the patient and her family. All questions are answered.  Allergies  Allergen Reactions  . Ibuprofen     "made my stomach hurt"    Past Medical History  Diagnosis Date  . Diabetes mellitus   . Hypertension   . Enlarged heart     Past Surgical History  Procedure Date  . Wisdom tooth extraction     Current Outpatient Prescriptions  Medication Sig Dispense Refill  . aspirin EC 81 MG tablet Take 81 mg by mouth daily.        . carvedilol (COREG CR) 80 MG 24 hr capsule Take 80 mg by mouth daily.        . furosemide (LASIX) 80 MG tablet Take 80 mg by mouth daily.        Marland Kitchen glipiZIDE (GLUCOTROL) 10 MG  tablet Take 10 mg by mouth 2 (two) times daily.       Marland Kitchen HYDROcodone-acetaminophen (VICODIN) 5-500 MG per tablet Take 1 tablet by mouth every 6 (six) hours as needed.      . Linagliptin-Metformin HCl (JENTADUETO) 2.03-999 MG TABS Take 1 tablet by mouth 2 (two) times daily.      . potassium chloride SA (K-DUR,KLOR-CON) 20 MEQ tablet Take 20 mEq by mouth daily.        . ramipril (ALTACE) 10 MG tablet Take 10 mg by mouth daily.        . traMADol  (ULTRAM) 50 MG tablet Take 1 tablet (50 mg total) by mouth every 6 (six) hours as needed for pain.  20 tablet  0  . valsartan (DIOVAN) 320 MG tablet Take 320 mg by mouth daily.        Marland Kitchen aspirin-sod bicarb-citric acid (ALKA-SELTZER) 325 MG TBEF Take 325 mg by mouth once as needed. For relief       . calcium carbonate (TUMS - DOSED IN MG ELEMENTAL CALCIUM) 500 MG chewable tablet Chew 1 tablet by mouth daily as needed. For upset stomach relief      . magnesium citrate SOLN Take 1 Bottle by mouth once.      . Simethicone (GAS-X EXTRA STRENGTH) 125 MG CAPS Take 1 capsule by mouth daily as needed. For relief        History   Social History  . Marital Status: Married    Spouse Name: N/A    Number of Children: N/A  . Years of Education: N/A   Occupational History  . Not on file.   Social History Main Topics  . Smoking status: Former Games developer  . Smokeless tobacco: Not on file     Comment: quit 22 years ago  . Alcohol Use: Yes     Comment: occasionally  . Drug Use: No  . Sexually Active: Not on file   Other Topics Concern  . Not on file   Social History Narrative  . No narrative on file    Family History  Problem Relation Age of Onset  . Diabetes Mother   . Hypertension Mother   . Diabetes Sister   . Hypertension Sister   . Hypertension Brother   . Stroke Maternal Aunt   . Diabetes Maternal Grandmother   . Hypertension Maternal Grandmother       Jeannette Corpus, MD 11/23/2012, 2:21 PM

## 2012-12-08 ENCOUNTER — Other Ambulatory Visit (HOSPITAL_COMMUNITY): Payer: Self-pay | Admitting: *Deleted

## 2012-12-09 ENCOUNTER — Encounter (HOSPITAL_COMMUNITY)
Admission: RE | Admit: 2012-12-09 | Discharge: 2012-12-09 | Disposition: A | Discharge status: Home / Self Care | Payer: BC Managed Care – PPO | Source: Ambulatory Visit | Attending: Gynecology | Admitting: Gynecology

## 2012-12-09 ENCOUNTER — Encounter (HOSPITAL_COMMUNITY): Payer: Self-pay | Admitting: Pharmacy Technician

## 2012-12-09 ENCOUNTER — Encounter (HOSPITAL_COMMUNITY): Payer: Self-pay

## 2012-12-09 ENCOUNTER — Ambulatory Visit (HOSPITAL_COMMUNITY)
Admission: RE | Admit: 2012-12-09 | Discharge: 2012-12-09 | Disposition: A | Discharge status: Home / Self Care | Payer: BC Managed Care – PPO | Source: Ambulatory Visit | Attending: Gynecology | Admitting: Gynecology

## 2012-12-09 DIAGNOSIS — Z01812 Encounter for preprocedural laboratory examination: Secondary | ICD-10-CM | POA: Insufficient documentation

## 2012-12-09 DIAGNOSIS — Z01818 Encounter for other preprocedural examination: Secondary | ICD-10-CM | POA: Insufficient documentation

## 2012-12-09 DIAGNOSIS — R9431 Abnormal electrocardiogram [ECG] [EKG]: Secondary | ICD-10-CM | POA: Insufficient documentation

## 2012-12-09 DIAGNOSIS — Z01810 Encounter for preprocedural cardiovascular examination: Secondary | ICD-10-CM | POA: Insufficient documentation

## 2012-12-09 DIAGNOSIS — C7982 Secondary malignant neoplasm of genital organs: Secondary | ICD-10-CM | POA: Insufficient documentation

## 2012-12-09 HISTORY — DX: Other cardiomyopathies: I42.8

## 2012-12-09 HISTORY — DX: Nontoxic goiter, unspecified: E04.9

## 2012-12-09 LAB — COMPREHENSIVE METABOLIC PANEL
ALT: 6 U/L (ref 0–35)
Alkaline Phosphatase: 52 U/L (ref 39–117)
CO2: 27 mEq/L (ref 19–32)
Chloride: 95 mEq/L — ABNORMAL LOW (ref 96–112)
GFR calc Af Amer: >90 mL/min (ref >90)
GFR calc non Af Amer: >90 mL/min (ref >90)
Glucose, Bld: 270 mg/dL — ABNORMAL HIGH (ref 70–99)
Potassium: 4 mEq/L (ref 3.5–5.1)
Sodium: 132 mEq/L — ABNORMAL LOW (ref 135–145)
Total Bilirubin: 0.3 mg/dL (ref 0.3–1.2)
Total Protein: 6.9 g/dL (ref 6.0–8.3)

## 2012-12-09 LAB — SURGICAL PCR SCREEN: MRSA, PCR: NEGATIVE

## 2012-12-09 LAB — CBC WITH DIFFERENTIAL/PLATELET
Hemoglobin: 10 g/dL — ABNORMAL LOW (ref 12.0–15.0)
Lymphocytes Relative: 26 % (ref 12–46)
Lymphs Abs: 2 10*3/uL (ref 0.7–4.0)
Monocytes Relative: 7 % (ref 3–12)
Neutro Abs: 4.8 10*3/uL (ref 1.7–7.7)
Neutrophils Relative %: 65 % (ref 43–77)
Platelets: 309 10*3/uL (ref 150–400)
RBC: 4.53 MIL/uL (ref 3.87–5.11)
WBC: 7.4 10*3/uL (ref 4.0–10.5)

## 2012-12-09 NOTE — Progress Notes (Signed)
Faxed cbc, cmet to [employer] through [employer]

## 2012-12-09 NOTE — Patient Instructions (Addendum)
20 Monica Allen  12/09/2012   Your procedure is scheduled on:  12/14/11  Report to [facility] at 0745      AM.  Call this number if you have problems the morning of surgery: [phone]     DO NOT TAKE ANY BLOOD SUGAR MEDICINE THE MORNING OF SURGERY  Remember:   Do not eat food  After midnight  Sunday NIGHT         BEGIN CLEAR LIQUIDS Monday AS PER OFFICE FOR BOWEL PREP.  INCREASE FLUIDS Monday  AND DRINK UNTIL MIDNIGHT OR BEDTIME Monday NIGHT-  THEN NOTHING BY MOUTH   Take these medicines the morning of surgery with A SIP OF WATER:COREG                          May take Tramadol if needed   .  Contacts, dentures or partial plates can not be worn to surgery  Leave suitcase in the car. After surgery it may be brought to your room.  For patients admitted to the hospital, checkout time is 11:00 AM day of  discharge.             SPECIAL INSTRUCTIONS- SEE [facility] PREPARING FOR SURGERY INSTRUCTION SHEET-     DO NOT WEAR JEWELRY, LOTIONS, POWDERS, OR PERFUMES.  WOMEN-- DO NOT SHAVE LEGS OR UNDERARMS FOR 12 HOURS BEFORE SHOWERS. MEN MAY SHAVE FACE.  Patients discharged the day of surgery will not be allowed to drive home. IF going home the day of surgery, you must have a driver and someone to stay with you for the first 24 hours  Name and phone number of your driver:  RICKIE- husband                                                                      Please read over the following fact sheets that you were given: MRSA Information, Incentive Spirometry Sheet, Blood Transfusion Sheet  Information                                                                                   Monica Allen  PST 336  [phone]                 FAILURE TO FOLLOW THESE INSTRUCTIONS MAY RESULT IN  CANCELLATION   OF YOUR SURGERY                                                  Patient Signature _____________________________

## 2012-12-09 NOTE — Progress Notes (Signed)
LOV Dr Tresa Endo 3/11,  eccho 1/11 with EKG on chart

## 2012-12-09 NOTE — Progress Notes (Signed)
12/09/12 1021  OBSTRUCTIVE SLEEP APNEA  Have you ever been diagnosed with sleep apnea through a sleep study? No  Do you snore loudly (loud enough to be heard through closed doors)?  1  Do you often feel tired, fatigued, or sleepy during the daytime? 0  Has anyone observed you stop breathing during your sleep? 0  Do you have, or are you being treated for high blood pressure? 1  BMI more than 35 kg/m2? 1  Age over 55 years old? 1  Neck circumference greater than 40 cm/18 inches? 0  Gender: 0  Obstructive Sleep Apnea Score 4   Score 4 or greater  Results sent to PCP

## 2012-12-13 ENCOUNTER — Encounter (HOSPITAL_COMMUNITY)
Admission: RE | Disposition: A | Discharge status: Home / Self Care | Payer: Self-pay | Source: Ambulatory Visit | Attending: Obstetrics & Gynecology

## 2012-12-13 ENCOUNTER — Encounter (HOSPITAL_COMMUNITY): Payer: Self-pay | Admitting: *Deleted

## 2012-12-13 ENCOUNTER — Inpatient Hospital Stay (HOSPITAL_COMMUNITY): Payer: BC Managed Care – PPO | Admitting: Anesthesiology

## 2012-12-13 ENCOUNTER — Encounter (HOSPITAL_COMMUNITY): Payer: Self-pay | Admitting: Anesthesiology

## 2012-12-13 ENCOUNTER — Inpatient Hospital Stay (HOSPITAL_COMMUNITY)
Admission: RE | Admit: 2012-12-13 | Discharge: 2012-12-16 | DRG: 357 | Disposition: A | Discharge status: Home / Self Care | Payer: BC Managed Care – PPO | Source: Ambulatory Visit | Attending: Obstetrics & Gynecology | Admitting: Obstetrics & Gynecology

## 2012-12-13 DIAGNOSIS — Z79899 Other long term (current) drug therapy: Secondary | ICD-10-CM

## 2012-12-13 DIAGNOSIS — C569 Malignant neoplasm of unspecified ovary: Secondary | ICD-10-CM

## 2012-12-13 DIAGNOSIS — R971 Elevated cancer antigen 125 [CA 125]: Secondary | ICD-10-CM | POA: Diagnosis present

## 2012-12-13 DIAGNOSIS — I428 Other cardiomyopathies: Secondary | ICD-10-CM | POA: Diagnosis present

## 2012-12-13 DIAGNOSIS — N83209 Unspecified ovarian cyst, unspecified side: Secondary | ICD-10-CM

## 2012-12-13 DIAGNOSIS — Z886 Allergy status to analgesic agent status: Secondary | ICD-10-CM

## 2012-12-13 DIAGNOSIS — E871 Hypo-osmolality and hyponatremia: Secondary | ICD-10-CM | POA: Diagnosis not present

## 2012-12-13 DIAGNOSIS — N736 Female pelvic peritoneal adhesions (postinfective): Secondary | ICD-10-CM | POA: Diagnosis present

## 2012-12-13 DIAGNOSIS — I10 Essential (primary) hypertension: Secondary | ICD-10-CM | POA: Diagnosis present

## 2012-12-13 DIAGNOSIS — R599 Enlarged lymph nodes, unspecified: Secondary | ICD-10-CM | POA: Diagnosis present

## 2012-12-13 DIAGNOSIS — Z87891 Personal history of nicotine dependence: Secondary | ICD-10-CM

## 2012-12-13 DIAGNOSIS — E119 Type 2 diabetes mellitus without complications: Secondary | ICD-10-CM | POA: Diagnosis present

## 2012-12-13 DIAGNOSIS — Z7982 Long term (current) use of aspirin: Secondary | ICD-10-CM

## 2012-12-13 HISTORY — PX: SALPINGOOPHORECTOMY: SHX82

## 2012-12-13 HISTORY — PX: ABDOMINAL HYSTERECTOMY: SHX81

## 2012-12-13 HISTORY — PX: OMENTECTOMY: SHX5985

## 2012-12-13 HISTORY — PX: LAPAROTOMY: SHX154

## 2012-12-13 LAB — GLUCOSE, CAPILLARY
Glucose-Capillary: 218 mg/dL — ABNORMAL HIGH (ref 70–99)
Glucose-Capillary: 308 mg/dL — ABNORMAL HIGH (ref 70–99)
Glucose-Capillary: 332 mg/dL — ABNORMAL HIGH (ref 70–99)

## 2012-12-13 LAB — HEMOGLOBIN AND HEMATOCRIT, BLOOD
HCT: 26.3 % — ABNORMAL LOW (ref 36.0–46.0)
Hemoglobin: 8.2 g/dL — ABNORMAL LOW (ref 12.0–15.0)

## 2012-12-13 SURGERY — LAPAROTOMY, EXPLORATORY
Anesthesia: General | Wound class: Clean Contaminated

## 2012-12-13 MED ORDER — LIDOCAINE HCL (CARDIAC) 20 MG/ML IV SOLN
INTRAVENOUS | Status: DC | PRN
  Administered 2012-12-13: 80 mg via INTRAVENOUS

## 2012-12-13 MED ORDER — INSULIN ASPART 100 UNIT/ML ~~LOC~~ SOLN
0.0000 [IU] | SUBCUTANEOUS | Status: DC
  Administered 2012-12-13 (×2): 7 [IU] via SUBCUTANEOUS
  Administered 2012-12-14 (×2): 4 [IU] via SUBCUTANEOUS
  Administered 2012-12-14 (×2): 7 [IU] via SUBCUTANEOUS
  Administered 2012-12-14: 4 [IU] via SUBCUTANEOUS
  Administered 2012-12-14: 7 [IU] via SUBCUTANEOUS
  Administered 2012-12-15: 3 [IU] via SUBCUTANEOUS
  Administered 2012-12-15 (×2): 4 [IU] via SUBCUTANEOUS

## 2012-12-13 MED ORDER — CISATRACURIUM BESYLATE (PF) 10 MG/5ML IV SOLN
INTRAVENOUS | Status: DC | PRN
  Administered 2012-12-13: 4 mg via INTRAVENOUS
  Administered 2012-12-13: 8 mg via INTRAVENOUS
  Administered 2012-12-13: 4 mg via INTRAVENOUS
  Administered 2012-12-13: 6 mg via INTRAVENOUS
  Administered 2012-12-13: 2 mg via INTRAVENOUS

## 2012-12-13 MED ORDER — DIPHENHYDRAMINE HCL 50 MG/ML IJ SOLN: 12.5000 mg | Freq: Four times a day (QID) | INTRAMUSCULAR | Status: DC | PRN

## 2012-12-13 MED ORDER — ONDANSETRON HCL 4 MG/2ML IJ SOLN
INTRAMUSCULAR | Status: DC | PRN
  Administered 2012-12-13: 4 mg via INTRAVENOUS

## 2012-12-13 MED ORDER — HYDROMORPHONE 0.3 MG/ML IV SOLN
INTRAVENOUS | Status: DC
  Administered 2012-12-13: 0.6 mg via INTRAVENOUS
  Administered 2012-12-13: 0.3 mg via INTRAVENOUS
  Administered 2012-12-13: 0.9 mg via INTRAVENOUS
  Administered 2012-12-14: 0.6 mg via INTRAVENOUS
  Administered 2012-12-14: 1.2 mg via INTRAVENOUS
  Administered 2012-12-14: 14:00:00 via INTRAVENOUS
  Administered 2012-12-14: 2.28 mg via INTRAVENOUS
  Administered 2012-12-14: 1.8 mg via INTRAVENOUS
  Administered 2012-12-15: 1.44 mg via INTRAVENOUS
  Administered 2012-12-15: 0.6 mg via INTRAVENOUS
  Administered 2012-12-15: 1.5 mg via INTRAVENOUS
  Administered 2012-12-15: 0.9 mg via INTRAVENOUS
  Filled 2012-12-13: qty 25

## 2012-12-13 MED ORDER — LACTATED RINGERS IV SOLN: INTRAVENOUS | Status: DC

## 2012-12-13 MED ORDER — SUCCINYLCHOLINE CHLORIDE 20 MG/ML IJ SOLN
INTRAMUSCULAR | Status: DC | PRN
  Administered 2012-12-13: 100 mg via INTRAVENOUS

## 2012-12-13 MED ORDER — HEPARIN SODIUM (PORCINE) 1000 UNIT/ML IJ SOLN
INTRAMUSCULAR | Status: DC | PRN
  Administered 2012-12-13: 1000 [IU]

## 2012-12-13 MED ORDER — RAMIPRIL 5 MG PO CAPS
10.0000 mg | ORAL_CAPSULE | Freq: Every day | ORAL | Status: DC
  Administered 2012-12-14 – 2012-12-16 (×2): 10 mg via ORAL
  Filled 2012-12-13 (×4): qty 2

## 2012-12-13 MED ORDER — ACETAMINOPHEN 10 MG/ML IV SOLN
1000.0000 mg | Freq: Four times a day (QID) | INTRAVENOUS | Status: AC
  Administered 2012-12-13 – 2012-12-14 (×3): 1000 mg via INTRAVENOUS
  Filled 2012-12-13 (×4): qty 100

## 2012-12-13 MED ORDER — DEXTROSE 5 % IV SOLN
3.0000 g | INTRAVENOUS | Status: AC
  Administered 2012-12-13: 3 g via INTRAVENOUS
  Filled 2012-12-13: qty 3000

## 2012-12-13 MED ORDER — DIPHENHYDRAMINE HCL 12.5 MG/5ML PO ELIX
12.5000 mg | ORAL_SOLUTION | Freq: Four times a day (QID) | ORAL | Status: DC | PRN
  Filled 2012-12-13: qty 5

## 2012-12-13 MED ORDER — OXYCODONE-ACETAMINOPHEN 5-325 MG PO TABS
1.0000 | ORAL_TABLET | ORAL | Status: DC | PRN
  Administered 2012-12-15 – 2012-12-16 (×3): 1 via ORAL
  Filled 2012-12-13 (×3): qty 1

## 2012-12-13 MED ORDER — HEMOSTATIC AGENTS (NO CHARGE) OPTIME
TOPICAL | Status: DC | PRN
  Administered 2012-12-13: 1 via TOPICAL

## 2012-12-13 MED ORDER — BUPIVACAINE LIPOSOME 1.3 % IJ SUSP
20.0000 mL | Freq: Once | INTRAMUSCULAR | Status: DC
  Filled 2012-12-13: qty 20

## 2012-12-13 MED ORDER — ZOLPIDEM TARTRATE 5 MG PO TABS: 5.0000 mg | ORAL_TABLET | Freq: Every evening | ORAL | Status: DC | PRN

## 2012-12-13 MED ORDER — IRBESARTAN 300 MG PO TABS
300.0000 mg | ORAL_TABLET | Freq: Every day | ORAL | Status: DC
  Administered 2012-12-14 – 2012-12-16 (×2): 300 mg via ORAL
  Filled 2012-12-13 (×4): qty 1

## 2012-12-13 MED ORDER — ALBUMIN HUMAN 5 % IV SOLN
12.5000 g | Freq: Once | INTRAVENOUS | Status: DC
  Filled 2012-12-13: qty 250

## 2012-12-13 MED ORDER — ALBUMIN HUMAN 25 % IV SOLN
12.5000 g | Freq: Once | INTRAVENOUS | Status: DC
  Filled 2012-12-13: qty 50

## 2012-12-13 MED ORDER — ACETAMINOPHEN 10 MG/ML IV SOLN
INTRAVENOUS | Status: DC | PRN
  Administered 2012-12-13: 1000 mg via INTRAVENOUS

## 2012-12-13 MED ORDER — CARVEDILOL PHOSPHATE ER 80 MG PO CP24
80.0000 mg | ORAL_CAPSULE | Freq: Every day | ORAL | Status: DC
  Administered 2012-12-14: 80 mg via ORAL
  Administered 2012-12-15: 60 mg via ORAL
  Administered 2012-12-15: 20 mg via ORAL
  Administered 2012-12-16: 80 mg via ORAL
  Filled 2012-12-13 (×7): qty 1

## 2012-12-13 MED ORDER — ONDANSETRON HCL 4 MG/2ML IJ SOLN: 4.0000 mg | Freq: Four times a day (QID) | INTRAMUSCULAR | Status: DC | PRN

## 2012-12-13 MED ORDER — NALOXONE HCL 0.4 MG/ML IJ SOLN: 0.4000 mg | INTRAMUSCULAR | Status: DC | PRN

## 2012-12-13 MED ORDER — 0.9 % SODIUM CHLORIDE (POUR BTL) OPTIME
TOPICAL | Status: DC | PRN
  Administered 2012-12-13: 2000 mL

## 2012-12-13 MED ORDER — KCL IN DEXTROSE-NACL 20-5-0.45 MEQ/L-%-% IV SOLN
INTRAVENOUS | Status: DC
  Administered 2012-12-13 – 2012-12-15 (×4): via INTRAVENOUS
  Filled 2012-12-13 (×7): qty 1000

## 2012-12-13 MED ORDER — INSULIN ASPART 100 UNIT/ML ~~LOC~~ SOLN
0.0000 [IU] | SUBCUTANEOUS | Status: DC
  Administered 2012-12-13: 5 [IU] via SUBCUTANEOUS
  Administered 2012-12-13: 11 [IU] via SUBCUTANEOUS

## 2012-12-13 MED ORDER — FENTANYL CITRATE 0.05 MG/ML IJ SOLN
INTRAMUSCULAR | Status: DC | PRN
  Administered 2012-12-13 (×2): 100 ug via INTRAVENOUS
  Administered 2012-12-13: 50 ug via INTRAVENOUS

## 2012-12-13 MED ORDER — INSULIN ASPART 100 UNIT/ML ~~LOC~~ SOLN: 5.0000 [IU] | Freq: Once | SUBCUTANEOUS | Status: DC

## 2012-12-13 MED ORDER — NEOSTIGMINE METHYLSULFATE 1 MG/ML IJ SOLN
INTRAMUSCULAR | Status: DC | PRN
  Administered 2012-12-13: 2.5 mg via INTRAVENOUS

## 2012-12-13 MED ORDER — SODIUM CHLORIDE 0.9 % IJ SOLN: 9.0000 mL | INTRAMUSCULAR | Status: DC | PRN

## 2012-12-13 MED ORDER — HYDROMORPHONE HCL PF 1 MG/ML IJ SOLN
INTRAMUSCULAR | Status: DC | PRN
  Administered 2012-12-13: 1 mg via INTRAVENOUS
  Administered 2012-12-13 (×2): 0.5 mg via INTRAVENOUS

## 2012-12-13 MED ORDER — ALBUMIN HUMAN 5 % IV SOLN
INTRAVENOUS | Status: DC | PRN
  Administered 2012-12-13: 11:00:00 via INTRAVENOUS

## 2012-12-13 MED ORDER — BUPIVACAINE LIPOSOME 1.3 % IJ SUSP
INTRAMUSCULAR | Status: DC | PRN
  Administered 2012-12-13: 20 mL

## 2012-12-13 MED ORDER — HYDROMORPHONE HCL PF 1 MG/ML IJ SOLN
0.2500 mg | INTRAMUSCULAR | Status: DC | PRN
  Administered 2012-12-13 (×2): 0.5 mg via INTRAVENOUS

## 2012-12-13 MED ORDER — LACTATED RINGERS IV SOLN
INTRAVENOUS | Status: DC | PRN
  Administered 2012-12-13 (×3): via INTRAVENOUS

## 2012-12-13 MED ORDER — INSULIN GLARGINE 100 UNIT/ML ~~LOC~~ SOLN
20.0000 [IU] | Freq: Every day | SUBCUTANEOUS | Status: DC
  Administered 2012-12-13: 20 [IU] via SUBCUTANEOUS

## 2012-12-13 MED ORDER — HETASTARCH-ELECTROLYTES 6 % IV SOLN
INTRAVENOUS | Status: DC | PRN
  Administered 2012-12-13: 11:00:00 via INTRAVENOUS

## 2012-12-13 MED ORDER — ONDANSETRON HCL 4 MG PO TABS
4.0000 mg | ORAL_TABLET | Freq: Four times a day (QID) | ORAL | Status: DC | PRN
  Filled 2012-12-13: qty 1

## 2012-12-13 MED ORDER — 0.9 % SODIUM CHLORIDE (POUR BTL) OPTIME
TOPICAL | Status: DC | PRN
  Administered 2012-12-13: 1000 mL

## 2012-12-13 MED ORDER — LACTATED RINGERS IV SOLN
INTRAVENOUS | Status: DC
  Administered 2012-12-13: 14:00:00 via INTRAVENOUS

## 2012-12-13 MED ORDER — GLYCOPYRROLATE 0.2 MG/ML IJ SOLN
INTRAMUSCULAR | Status: DC | PRN
  Administered 2012-12-13: .5 mg via INTRAVENOUS

## 2012-12-13 MED ORDER — PROMETHAZINE HCL 25 MG/ML IJ SOLN: 6.2500 mg | INTRAMUSCULAR | Status: DC | PRN

## 2012-12-13 MED ORDER — MEPERIDINE HCL 50 MG/ML IJ SOLN: 6.2500 mg | INTRAMUSCULAR | Status: DC | PRN

## 2012-12-13 MED ORDER — PROPOFOL 10 MG/ML IV BOLUS
INTRAVENOUS | Status: DC | PRN
  Administered 2012-12-13: 180 mg via INTRAVENOUS

## 2012-12-13 MED ORDER — MIDAZOLAM HCL 5 MG/5ML IJ SOLN
INTRAMUSCULAR | Status: DC | PRN
  Administered 2012-12-13: 2 mg via INTRAVENOUS

## 2012-12-13 MED ORDER — CARVEDILOL PHOSPHATE ER 80 MG PO CP24
80.0000 mg | ORAL_CAPSULE | Freq: Every day | ORAL | Status: DC
  Filled 2012-12-13: qty 1

## 2012-12-13 SURGICAL SUPPLY — 55 items
ATTRACTOMAT 16X20 MAGNETIC DRP (DRAPES) ×3 IMPLANT
BAG URINE DRAINAGE (UROLOGICAL SUPPLIES) ×2 IMPLANT
CANISTER SUCTION 2500CC (MISCELLANEOUS) ×5 IMPLANT
CHLORAPREP W/TINT 10.5 ML (MISCELLANEOUS) ×4 IMPLANT
CLIP TI MEDIUM LARGE 6 (CLIP) ×12 IMPLANT
CLOTH BEACON ORANGE TIMEOUT ST (SAFETY) ×3 IMPLANT
COVER SURGICAL LIGHT HANDLE (MISCELLANEOUS) ×3 IMPLANT
DRAIN CHANNEL 10F 3/8 F FF (DRAIN) ×1 IMPLANT
DRAPE UTILITY XL STRL (DRAPES) ×3 IMPLANT
DRAPE WARM FLUID 44X44 (DRAPE) ×3 IMPLANT
DRSG TELFA 4X14 ISLAND ADH (GAUZE/BANDAGES/DRESSINGS) ×1 IMPLANT
ELECT BLADE 6.5 EXT (BLADE) ×3 IMPLANT
ELECT REM PT RETURN 9FT ADLT (ELECTROSURGICAL) ×3
ELECTRODE REM PT RTRN 9FT ADLT (ELECTROSURGICAL) ×2 IMPLANT
EVACUATOR SILICONE 100CC (DRAIN) ×1 IMPLANT
GAUZE SPONGE 4X4 16PLY XRAY LF (GAUZE/BANDAGES/DRESSINGS) ×3 IMPLANT
GLOVE BIOGEL M 6.5 STRL (GLOVE) ×1 IMPLANT
GLOVE BIOGEL M STRL SZ7.5 (GLOVE) ×14 IMPLANT
GLOVE BIOGEL PI IND STRL 7.0 (GLOVE) IMPLANT
GLOVE BIOGEL PI INDICATOR 7.0 (GLOVE) ×2
GOWN PREVENTION PLUS LG XLONG (DISPOSABLE) ×2 IMPLANT
GOWN STRL NON-REIN LRG LVL3 (GOWN DISPOSABLE) ×6 IMPLANT
GOWN STRL REIN XL XLG (GOWN DISPOSABLE) ×4 IMPLANT
LIGASURE IMPACT 36 18CM CVD LR (INSTRUMENTS) ×1 IMPLANT
NDL SAFETY ECLIPSE 18X1.5 (NEEDLE) IMPLANT
NEEDLE HYPO 18GX1.5 SHARP (NEEDLE) ×3
NS IRRIG 1000ML POUR BTL (IV SOLUTION) ×8 IMPLANT
PACK ABDOMINAL WL (CUSTOM PROCEDURE TRAY) ×3 IMPLANT
SEALER TISSUE X1 CVD JAW (INSTRUMENTS) IMPLANT
SHEET LAVH (DRAPES) ×3 IMPLANT
SLEEVE SURGEON STRL (DRAPES) ×2 IMPLANT
SPONGE LAP 18X18 X RAY DECT (DISPOSABLE) ×9 IMPLANT
STAPLER SKIN PROX WIDE 3.9 (STAPLE) ×3 IMPLANT
SURGIFLO W/THROMBIN 8M KIT (HEMOSTASIS) ×1 IMPLANT
SUT ETHILON 1 LR 30 (SUTURE) IMPLANT
SUT PDS AB 1 CTXB1 36 (SUTURE) ×6 IMPLANT
SUT SILK 2 0 (SUTURE)
SUT SILK 2 0 30  PSL (SUTURE) ×1
SUT SILK 2 0 30 PSL (SUTURE) IMPLANT
SUT SILK 2-0 18XBRD TIE 12 (SUTURE) ×2 IMPLANT
SUT VIC AB 0 CT1 27 (SUTURE) ×9
SUT VIC AB 0 CT1 27XBRD ANTBC (SUTURE) IMPLANT
SUT VIC AB 0 CT1 36 (SUTURE) ×12 IMPLANT
SUT VIC AB 2-0 CT2 27 (SUTURE) ×23 IMPLANT
SUT VIC AB 2-0 SH 27 (SUTURE) ×12
SUT VIC AB 2-0 SH 27X BRD (SUTURE) ×12 IMPLANT
SUT VIC AB 3-0 CTX 36 (SUTURE) IMPLANT
SUT VICRYL 2 0 18  UND BR (SUTURE) ×1
SUT VICRYL 2 0 18 UND BR (SUTURE) ×2 IMPLANT
SYR 3ML LL SCALE MARK (SYRINGE) ×1 IMPLANT
TAPE CLOTH SOFT 2X10 (GAUZE/BANDAGES/DRESSINGS) ×1 IMPLANT
TOWEL OR 17X26 10 PK STRL BLUE (TOWEL DISPOSABLE) ×4 IMPLANT
TOWEL OR NON WOVEN STRL DISP B (DISPOSABLE) ×2 IMPLANT
TRAY FOLEY CATH 14FRSI W/METER (CATHETERS) ×3 IMPLANT
WATER STERILE IRR 1500ML POUR (IV SOLUTION) ×2 IMPLANT

## 2012-12-13 NOTE — H&P (View-Only) (Signed)
Consult Note: Gyn-Onc   Monica Allen 56 y.o. female  Chief Complaint  Patient presents with  . Pelvic mass    New patient    Interval History:   HPI: 56-year-old African American female seen in consultation at the request of Dr. Eure regarding management of a newly diagnosed large abdominal pelvic mass. Patient reports that she's had increasing abdominal pressure will last several months and then increasing pain. Initial evaluation on December 31 revealed a large complex cystic mass with multiple septations and internal flow. The mass measures 34 x 24 x 21 cm. CT scan was also obtained revealing the following results:  19 x 23 x 19 cm cystic right ovarian mass with internal thick enhancing septations. This is concerning for a cystic ovarian  neoplasm. The presence of peritoneal fluid and lymphadenopathy is concerning for spread of disease. CA 125 was 64.7 units per mL. The patient denies any past gynecologic history. She is not on any hormone replacement.  She has not had any abdominal or pelvic surgery in the past.  Review of Systems:10 point review of systems is negative as noted above.   Vitals: Blood pressure 122/70, pulse 80, temperature 99 F (37.2 C), temperature source Oral, resp. rate 22, height 5' 3.58" (1.615 m), weight 287 lb 12.8 oz (130.545 kg), last menstrual period 10/19/2012.  Physical Exam: General : The patient is a healthy woman in no acute distress.  HEENT: normocephalic, extraoccular movements normal; neck is supple without thyromegally  Lynphnodes: Supraclavicular and inguinal nodes not enlarged  Abdomen:  Massively distended and obese. There is dullness to percussion throughout the entire abdomen. She is so is distended but is difficult to outline any specific mass. Pelvic:  EGBUS: Normal female  Vagina: Normal, no lesions  Urethra and Bladder: Normal, non-tender  Cervix: Normal  Uterus: Unable to outline due to patient's obesity and abdominal distention.    Bi-manual examination: Non-tender; no adenxal masses or nodularity  Rectal: normal sphincter tone, no masses, no blood  Lower extremities: No edema or varicosities. Normal range of motion    Assessment/Plan: Large abdominal pelvic mass with septations, internal flow, and elevated CA 125. In addition the patient has some ascites and adenopathy all concerning for ovarian cancer.  I recommend the patient undergo resection of the mass through midline laparotomy incision and surgical staging. Given the patient's morbid obesity (BMI of 50) complete staging including lymphadenectomy may not be possible.  The patient was offered the opportunity of surgery at [facility] on January 28 or at [facility] on February 4. She elected to have surgery at [facility]. We'll arrange preoperative evaluation by anesthesia. The risks of surgery were discussed with the patient and her family. All questions are answered.  Allergies  Allergen Reactions  . Ibuprofen     "made my stomach hurt"    Past Medical History  Diagnosis Date  . Diabetes mellitus   . Hypertension   . Enlarged heart     Past Surgical History  Procedure Date  . Wisdom tooth extraction     Current Outpatient Prescriptions  Medication Sig Dispense Refill  . aspirin EC 81 MG tablet Take 81 mg by mouth daily.        . carvedilol (COREG CR) 80 MG 24 hr capsule Take 80 mg by mouth daily.        . furosemide (LASIX) 80 MG tablet Take 80 mg by mouth daily.        . glipiZIDE (GLUCOTROL) 10 MG   tablet Take 10 mg by mouth 2 (two) times daily.       . HYDROcodone-acetaminophen (VICODIN) 5-500 MG per tablet Take 1 tablet by mouth every 6 (six) hours as needed.      . Linagliptin-Metformin HCl (JENTADUETO) 2.03-999 MG TABS Take 1 tablet by mouth 2 (two) times daily.      . potassium chloride SA (K-DUR,KLOR-CON) 20 MEQ tablet Take 20 mEq by mouth daily.        . ramipril (ALTACE) 10 MG tablet Take 10 mg by mouth daily.        . traMADol  (ULTRAM) 50 MG tablet Take 1 tablet (50 mg total) by mouth every 6 (six) hours as needed for pain.  20 tablet  0  . valsartan (DIOVAN) 320 MG tablet Take 320 mg by mouth daily.        . aspirin-sod bicarb-citric acid (ALKA-SELTZER) 325 MG TBEF Take 325 mg by mouth once as needed. For relief       . calcium carbonate (TUMS - DOSED IN MG ELEMENTAL CALCIUM) 500 MG chewable tablet Chew 1 tablet by mouth daily as needed. For upset stomach relief      . magnesium citrate SOLN Take 1 Bottle by mouth once.      . Simethicone (GAS-X EXTRA STRENGTH) 125 MG CAPS Take 1 capsule by mouth daily as needed. For relief        History   Social History  . Marital Status: Married    Spouse Name: N/A    Number of Children: N/A  . Years of Education: N/A   Occupational History  . Not on file.   Social History Main Topics  . Smoking status: Former Smoker  . Smokeless tobacco: Not on file     Comment: quit 22 years ago  . Alcohol Use: Yes     Comment: occasionally  . Drug Use: No  . Sexually Active: Not on file   Other Topics Concern  . Not on file   Social History Narrative  . No narrative on file    Family History  Problem Relation Age of Onset  . Diabetes Mother   . Hypertension Mother   . Diabetes Sister   . Hypertension Sister   . Hypertension Brother   . Stroke Maternal Aunt   . Diabetes Maternal Grandmother   . Hypertension Maternal Grandmother       CLARKE-PEARSON,Sedona Wenk L, MD 11/23/2012, 2:21 PM         

## 2012-12-13 NOTE — Op Note (Signed)
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  PROCEDURE: Total abdominal hysterectomy, bilateral salpingo-oophorectomy, lysis of adhesions, partial omentectomy, resection of pelvic peritoneal implant  SURGEON: De Blanch, M.D ASSISTANT: Antionette Char M.D., Telford Nab RN ANESTHESIA: Gen. with oral tracheal tube ESTIMATED BLOOD LOSS: 400 mL  SURGICAL FINDINGS: At time of exploratory laparotomy the patient had minimal amount of ascites. She had an approximately 30 cm cystic and solid ovarian tumor arising from the right ovary. It was adherent to the small bowel mesentery anterior abdominal wall and pelvic peritoneum. There was no evidence of any upper abdominal metastases. There was a single 3 cm implant on the peritoneum of the bladder. The left tube and ovary and uterus appeared normal. There was no pelvic or periaortic adenopathy. At the completion of the surgical procedure there was no evidence of residual disease. Given the extensive adhesions to the small bowel mesentery, I would assign the patient a surgical stage of III a (pending final pathology).  PROCEDURE: The patient was brought to the operating room and after satisfactory attainment of general anesthesia was placed in a modified lithotomy position in Rea stirrups. The anterior bowel wall, perineum, and vagina were prepped, a Foley catheter was placed, the patient was draped. Surgical timeout was taken. The abdomen was entered through a midline incision. Peritoneal washings were obtained although the cyst had been ruptured prior to obtain washings. The upper abdomen and pelvis were explored with some difficulty because of the large cyst was adherent to the mesentery of  the small bowel and the anterior abdominal wall. Using blunt and sharp dissection all adhesions and adherent areas were freed up. In the course of this dissection the cyst was ruptured and drained a considerable amount of fluid. Once the ovarian tumor was totally mobilized, its pedicle was doubly clamped divided and the right tube and ovary were handed off the operative field and submitted to frozen section. Frozen section revealed a significant amount of necrosis but carcinoma consistent with an endometrioid tumor. Bookwalter retractor was positioned and the bowel packed out of the pelvis. The uterus was grasped with long Kelleys and the round ligaments were divided. The lateral pelvic peritoneum was incised and the vessels and ureter identified. The ovarian vessels were skeletonized on the right, clamped, cut free tied and suture-ligated. Similar procedure was performed on the left. The bladder flap was incised and then advanced with sharp and blunt dissection. Uterine vessels were skeletonized and clamped cut and suture ligated. In a stepwise fashion the paracervical and cardinal ligaments were clamped cut and suture ligated. The vaginal angles crossclamped and the vagina transected from its connection to the cervix. The uterus and cervix left tube and ovary were handed off the operative field. Vaginal angles were transfixed with 0 Vicryl and the central portion of vagina closed with 0 Vicryl. The pelvis was inspected and  hemostasis achieved with cautery. Isolated implant on the bladder peritoneum was then excised.  Attention was turned to the upper abdomen. We ran the small bowel from the ligament of Treitz to the duodenum. Appendix appeared normal. Areas of oozing on the mesentery were controlled with cautery and FloSeal.  Attention was turned to the omentum which was removed from its connection to the transverse colon using the LigaSure.  The abdomen and  pelvis were reinspected and found to be  hemostatic. The anterior abdominal wall was closed in layers, the first being a running mass closure using #1 PDS. Subcutaneous tissue was irrigated and experil was injected subcutaneously. A 10 flat Jackson-Pratt drain was placed above the fascia and exited through a stab incision in the left upper quadrant. The drain was secured to the skin with 2-0 silk. Hemostasis in the subcutaneous layer was achieved with cautery and the skin closed skin staples. A dressing was applied, patient was awakened from anesthesia and taken to recovery room in satisfactory condition. Sponge, needle, and isthmic counts correct x2   De Blanch, M.D

## 2012-12-13 NOTE — Anesthesia Postprocedure Evaluation (Signed)
  Anesthesia Post-op Note  Patient: Monica Allen  Procedure(s) Performed: Procedure(s) (LRB): EXPLORATORY LAPAROTOMY (N/A) HYSTERECTOMY ABDOMINAL (N/A) SALPINGO OOPHORECTOMY (Bilateral) OMENTECTOMY (N/A)  Patient Location: PACU  Anesthesia Type: General  Level of Consciousness: awake and alert   Airway and Oxygen Therapy: Patient Spontanous Breathing  Post-op Pain: mild  Post-op Assessment: Post-op Vital signs reviewed, Patient's Cardiovascular Status Stable, Respiratory Function Stable, Patent Airway and No signs of Nausea or vomiting  Last Vitals:  Filed Vitals:   12/13/12 1435  BP: 132/79  Pulse: 69  Temp: 36.2 C  Resp: 21    Post-op Vital Signs: stable   Complications: No apparent anesthesia complications

## 2012-12-13 NOTE — Anesthesia Preprocedure Evaluation (Addendum)
Anesthesia Evaluation  Patient identified by MRN, date of birth, ID band Patient awake    Reviewed: Allergy & Precautions, H&P , NPO status , Patient's Chart, lab work & pertinent test results  Airway Mallampati: II TM Distance: >3 FB Neck ROM: Full    Dental No notable dental hx.    Pulmonary neg pulmonary ROS, former smoker,  breath sounds clear to auscultation  Pulmonary exam normal       Cardiovascular hypertension, Pt. on medications +CHF ( Nonischemic cardiomyopathy) negative cardio ROS  Rhythm:Regular Rate:Normal     Neuro/Psych PSYCHIATRIC DISORDERS Anxiety Depression negative neurological ROS  negative psych ROS   GI/Hepatic negative GI ROS, Neg liver ROS,   Endo/Other  negative endocrine ROSdiabetes, Type 2, Oral Hypoglycemic Agents  Renal/GU negative Renal ROS  negative genitourinary   Musculoskeletal negative musculoskeletal ROS (+)   Abdominal   Peds negative pediatric ROS (+)  Hematology negative hematology ROS (+)   Anesthesia Other Findings   Reproductive/Obstetrics negative OB ROS                          Anesthesia Physical Anesthesia Plan  ASA: II  Anesthesia Plan: General   Post-op Pain Management:    Induction: Intravenous  Airway Management Planned: Oral ETT  Additional Equipment:   Intra-op Plan:   Post-operative Plan: Extubation in OR  Informed Consent: I have reviewed the patients History and Physical, chart, labs and discussed the procedure including the risks, benefits and alternatives for the proposed anesthesia with the patient or authorized representative who has indicated his/her understanding and acceptance.   Dental advisory given  Plan Discussed with: CRNA  Anesthesia Plan Comments:         Anesthesia Quick Evaluation

## 2012-12-13 NOTE — Transfer of Care (Signed)
Immediate Anesthesia Transfer of Care Note  Patient: Monica Allen  Procedure(s) Performed: Procedure(s) (LRB) with comments: EXPLORATORY LAPAROTOMY (N/A) HYSTERECTOMY ABDOMINAL (N/A) SALPINGO OOPHORECTOMY (Bilateral) OMENTECTOMY (N/A)  Patient Location: PACU  Anesthesia Type:General  Level of Consciousness: awake, alert  and oriented  Airway & Oxygen Therapy: Patient Spontanous Breathing and Patient connected to face mask oxygen  Post-op Assessment: Report given to PACU RN  Post vital signs: Reviewed and stable  Complications: No apparent anesthesia complications2

## 2012-12-13 NOTE — Interval H&P Note (Signed)
History and Physical Interval Note:  12/13/2012 9:09 AM  Monica Allen  has presented today for surgery, with the diagnosis of PELVIC MASS  The various methods of treatment have been discussed with the patient and family. After consideration of risks, benefits and other options for treatment, the patient has consented to  Procedure(s) (LRB) with comments: EXPLORATORY LAPAROTOMY (N/A) - TOTAL ABDOMINAL HYSTERECTOMY, BILATERAL SALPINGO OOPHERECTOMY, POSSIBLE STAGING as a surgical intervention .  The patient's history has been reviewed, patient examined, no change in status, stable for surgery.  I have reviewed the patient's chart and labs.  Questions were answered to the patient's satisfaction.     CLARKE-PEARSON,Monica Allen

## 2012-12-13 NOTE — Progress Notes (Signed)
Dr. Acey Lav notified of patient's CBG no treatment ordered.

## 2012-12-13 NOTE — Anesthesia Procedure Notes (Signed)
Procedure Name: Intubation Date/Time: 12/13/2012 9:46 AM Performed by: Hulan Fess Pre-anesthesia Checklist: Patient identified, Emergency Drugs available, Suction available, Patient being monitored and Timeout performed Patient Re-evaluated:Patient Re-evaluated prior to inductionOxygen Delivery Method: Circle system utilized Preoxygenation: Pre-oxygenation with 100% oxygen Intubation Type: IV induction Ventilation: Mask ventilation without difficulty Laryngoscope Size: Mac and 3 Grade View: Grade I Tube type: Oral Number of attempts: 2 Airway Equipment and Method: Stylet

## 2012-12-14 ENCOUNTER — Encounter (HOSPITAL_COMMUNITY): Payer: Self-pay | Admitting: Gynecology

## 2012-12-14 LAB — CBC
HCT: 26.7 % — ABNORMAL LOW (ref 36.0–46.0)
MCH: 21.5 pg — ABNORMAL LOW (ref 26.0–34.0)
MCV: 70.1 fL — ABNORMAL LOW (ref 78.0–100.0)
RDW: 14.7 % (ref 11.5–15.5)
WBC: 9.6 10*3/uL (ref 4.0–10.5)

## 2012-12-14 LAB — GLUCOSE, CAPILLARY: Glucose-Capillary: 196 mg/dL — ABNORMAL HIGH (ref 70–99)

## 2012-12-14 LAB — BASIC METABOLIC PANEL
BUN: 3 mg/dL — ABNORMAL LOW (ref 6–23)
CO2: 28 mEq/L (ref 19–32)
Chloride: 98 mEq/L (ref 96–112)
Creatinine, Ser: 0.52 mg/dL (ref 0.50–1.10)
Glucose, Bld: 227 mg/dL — ABNORMAL HIGH (ref 70–99)

## 2012-12-14 MED ORDER — ENOXAPARIN SODIUM 40 MG/0.4ML ~~LOC~~ SOLN
40.0000 mg | SUBCUTANEOUS | Status: DC
  Administered 2012-12-14 – 2012-12-16 (×3): 40 mg via SUBCUTANEOUS
  Filled 2012-12-14 (×3): qty 0.4

## 2012-12-14 MED ORDER — INSULIN GLARGINE 100 UNIT/ML ~~LOC~~ SOLN
24.0000 [IU] | Freq: Every day | SUBCUTANEOUS | Status: DC
  Administered 2012-12-14 – 2012-12-15 (×2): 24 [IU] via SUBCUTANEOUS

## 2012-12-14 NOTE — Progress Notes (Signed)
Inpatient Diabetes Program Recommendations  AACE/ADA: New Consensus Statement on Inpatient Glycemic Control (2013)  Target Ranges:  Prepandial:   less than 140 mg/dL      Peak postprandial:   less than 180 mg/dL (1-2 hours)      Critically ill patients:  140 - 180 mg/dL   Reason for Visit: Hyperglycemia  57 year old female with hx Type 2 DM, admitted with diagnosis of pelvic mass.  Explor lap on 02/04.  On CL diet.  On OHAs at home - no insulin.  Results for ADDYSON, TRAUB (MRN [id number]) as of 12/14/2012 15:07  Ref. Range 12/13/2012 08:04 12/13/2012 12:23 12/13/2012 13:36 12/13/2012 17:04 12/13/2012 20:06 12/13/2012 23:55 12/14/2012 03:55 12/14/2012 12:08  Glucose-Capillary Latest Range: 70-99 mg/dL 409 (H) 811 (H) 914 (H) 240 (H) 248 (H) 218 (H) 196 (H) 242 (H)    Inpatient Diabetes Program Recommendations Correction (SSI): Increase Novolog to resistant tidwc and hs when diet advanced HgbA1C: Need updated HgbA1C - Last one 04/02/2009 Diet: When advanced, CHO mod med  Note: Will continue to follow.  Thank you. Ailene Ards, RD, LDN, CDE Inpatient Diabetes Coordinator [phone]

## 2012-12-14 NOTE — Progress Notes (Signed)
1 Day Post-Op Procedure(s) (LRB): EXPLORATORY LAPAROTOMY (N/A) HYSTERECTOMY ABDOMINAL (N/A) SALPINGO OOPHORECTOMY (Bilateral) OMENTECTOMY (N/A)  Subjective: Patient reports "feeling pretty good."  Increasing ambulation with assist.  Adequate pain relief with PCA use.  No complaints voiced.  Requesting a cup of coffee.  Denies nausea, vomiting, passing flatus, chest pain, or dyspnea.  Objective: Vital signs in last 24 hours: Temp:  [97.2 F (36.2 C)-99.3 F (37.4 C)] 98.8 F (37.1 C) (02/05 0530) Pulse Rate:  [56-88] 81  (02/05 0530) Resp:  [11-25] 22  (02/05 0800) BP: (121-153)/(53-79) 128/60 mmHg (02/05 0530) SpO2:  [33 %-100 %] 96 % (02/05 0800) FiO2 (%):  [33 %] 33 % (02/04 2025) Weight:  [289 lb (131.09 kg)] 289 lb (131.09 kg) (02/04 1435) Last BM Date: 12/12/12  Intake/Output from previous day: 02/04 0701 - 02/05 0700 In: 5420.8 [I.V.:4670.8; IV Piggyback:750] Out: 8370 [Urine:1200; Drains:70; Blood:1000]  Physical Examination: General: alert, cooperative and no distress Resp: clear to auscultation bilaterally Cardio: regular rate and rhythm, S1, S2 normal, no murmur, click, rub or gallop GI: soft, non-tender; bowel sounds normal; no masses,  no organomegaly, incision: midline incision with staples dressing removed, incision intact.  JP charged with minimal serosanguinous drainage noted. and abdomen morbidly obese Extremities: extremities normal, atraumatic, no cyanosis or edema  Labs: WBC/Hgb/Hct/Plts:  9.6/8.2/26.7/300 (02/05 0417) BUN/Cr/glu/ALT/AST/amyl/lip:  3/0.52/--/--/--/--/-- (02/05 0417)  Assessment: 57 y.o. s/p Procedure(s): EXPLORATORY LAPAROTOMY HYSTERECTOMY ABDOMINAL SALPINGO OOPHORECTOMY OMENTECTOMY: stable Pain:  Pain is well-controlled on PCA.  Heme:  Hgb 8.2 and Hct 26.7 this am.  H&H in PACU on 12/14/11 was Hgb 8.2 and Hct 26.3.  Stable at this time.  CV: History of hypertension and non-ischemic cardiomyopathy.  Current treatment:  irbesartran  (Avapro), Altace, and Coreg.  Stable at this time.  GI:  Tolerating po: No: NPO.     FEN:  Na+ 132 this am, questioning pseudohyponatremia with blood glucose of 227 at that time, recheck in the am.  Endo: Diabetes mellitus Type II, under poor control..  CBG: CBG (last 3)   Basename 12/14/12 0355 12/13/12 2355 12/13/12 2006  GLUCAP 196* 218* 248*    Prophylaxis: pharmacologic prophylaxis (with any of the following: enoxaparin (Lovenox) 40mg  SQ 2 hours prior to surgery then every day) and intermittent pneumatic compression boots.  Plan: Encourage ambulation Begin clear liquids Encourage IS use, deep breathing, and coughing Am CBC and Bmet Monitor CBGs and increase Lantus to 24 units at bedtime per Dr. Tamela Oddi Continue post-operative plan of care   LOS: 1 day    Allen, Monica DEAL 12/14/2012, 9:54 AM

## 2012-12-14 NOTE — Care Management Note (Signed)
    Page 1 of 1   12/14/2012     10:40:18 AM   CARE MANAGEMENT NOTE 12/14/2012  Patient:  Monica Allen, Monica Allen   Account Number:  [id number]  Date Initiated:  12/14/2012  Documentation initiated by:  Luda Charbonneau  Subjective/Objective Assessment:   57 yo female admitted s/p open TAH, BSO, omenectomy, lysis of adhesions. PTA lived at home with spouse.     Action/Plan:   Home when stable   Anticipated DC Date:  12/17/2012   Anticipated DC Plan:  HOME/SELF CARE      DC Planning Services  CM consult      Choice offered to / List presented to:             Status of service:  Completed, signed off Medicare Important Message given?   (If response is "NO", the following Medicare IM given date fields will be blank) Date Medicare IM given:   Date Additional Medicare IM given:    Discharge Disposition:  HOME/SELF CARE  Per UR Regulation:  Reviewed for med. necessity/level of care/duration of stay  If discussed at Long Length of Stay Meetings, dates discussed:    Comments:

## 2012-12-15 LAB — BASIC METABOLIC PANEL
BUN: 3 mg/dL — ABNORMAL LOW (ref 6–23)
CO2: 25 mEq/L (ref 19–32)
Chloride: 101 mEq/L (ref 96–112)
Creatinine, Ser: 0.53 mg/dL (ref 0.50–1.10)

## 2012-12-15 LAB — GLUCOSE, CAPILLARY
Glucose-Capillary: 130 mg/dL — ABNORMAL HIGH (ref 70–99)
Glucose-Capillary: 141 mg/dL — ABNORMAL HIGH (ref 70–99)

## 2012-12-15 MED ORDER — INSULIN ASPART 100 UNIT/ML ~~LOC~~ SOLN
0.0000 [IU] | Freq: Three times a day (TID) | SUBCUTANEOUS | Status: DC
  Administered 2012-12-15: 3 [IU] via SUBCUTANEOUS
  Administered 2012-12-15: 7 [IU] via SUBCUTANEOUS

## 2012-12-15 MED ORDER — LINAGLIPTIN 5 MG PO TABS
2.5000 mg | ORAL_TABLET | Freq: Two times a day (BID) | ORAL | Status: DC
  Administered 2012-12-15 – 2012-12-16 (×3): 2.5 mg via ORAL
  Filled 2012-12-15 (×4): qty 1

## 2012-12-15 MED ORDER — GLIPIZIDE 10 MG PO TABS
10.0000 mg | ORAL_TABLET | Freq: Every day | ORAL | Status: DC
  Administered 2012-12-15 – 2012-12-16 (×2): 10 mg via ORAL
  Filled 2012-12-15 (×3): qty 1

## 2012-12-15 MED ORDER — METFORMIN HCL 500 MG PO TABS
1000.0000 mg | ORAL_TABLET | Freq: Two times a day (BID) | ORAL | Status: DC
  Administered 2012-12-15 – 2012-12-16 (×3): 1000 mg via ORAL
  Filled 2012-12-15 (×4): qty 2

## 2012-12-15 MED ORDER — LINAGLIPTIN-METFORMIN HCL 2.5-1000 MG PO TABS: 1.0000 | ORAL_TABLET | Freq: Two times a day (BID) | ORAL | Status: DC

## 2012-12-15 MED ORDER — BISACODYL 10 MG RE SUPP
10.0000 mg | Freq: Once | RECTAL | Status: AC
  Administered 2012-12-15: 10 mg via RECTAL
  Filled 2012-12-15: qty 1

## 2012-12-15 NOTE — Progress Notes (Signed)
2 Days Post-Op Procedure(s) (LRB): EXPLORATORY LAPAROTOMY (N/A) HYSTERECTOMY ABDOMINAL (N/A) SALPINGO OOPHORECTOMY (Bilateral) OMENTECTOMY (N/A)  Subjective: Patient reports feeling well. Tolerating clear liquid diet.  Ambulating without difficulty.  Adequate pain relief reported.  Denies passing flatus or having a bowel movement.   Objective: Vital signs in last 24 hours: Temp:  [99 F (37.2 C)-99.5 F (37.5 C)] 99.5 F (37.5 C) (02/06 0600) Pulse Rate:  [78-82] 81  (02/06 0600) Resp:  [19-25] 25  (02/06 0942) BP: (101-114)/(65-70) 114/69 mmHg (02/06 0600) SpO2:  [98 %-100 %] 100 % (02/06 0942) Last BM Date: 12/12/12  Intake/Output from previous day: 02/05 0701 - 02/06 0700 In: 3625 [P.O.:600; I.V.:3025] Out: 1290 [Urine:1250; Drains:40]  Physical Examination: General: alert, cooperative and no distress Resp: clear to auscultation bilaterally Cardio: regular rate and rhythm, S1, S2 normal, no murmur, click, rub or gallop GI: soft, non-tender; bowel sounds normal; no masses,  no organomegaly, incision: midline incision with staples clean, dry, and intact.  and abdomen morbidly obese Extremities: extremities normal, atraumatic, no cyanosis or edema JP bulb to left abdomen charged with dressing dry and intact  Labs:   BUN/Cr/glu/ALT/AST/amyl/lip:  3/0.53/--/--/--/--/-- (02/06 0405)  Assessment: 57 y.o. s/p Procedure(s): EXPLORATORY LAPAROTOMY HYSTERECTOMY ABDOMINAL SALPINGO OOPHORECTOMY OMENTECTOMY: stable Pain:  Pain is well-controlled on PCA.  Heme:  Hgb 8.2 and Hct 26.7 on 12/14/12.  Stable at this time.  CV:  History of hypertension and non-ischemic cardiomyopathy. Current treatment: irbesartran (Avapro), Altace, and Coreg. Stable at this time.   GI:  Tolerating po:  Yes.   FEN:  Na+ 133 this am from 132 on 12/14/12.  Ca+ 7.4 this am.  Patient asymptomatic.  Endo: Diabetes mellitus Type II, under good control.  CBG:  CBG (last 3)   Basename 12/15/12 0732  12/15/12 0349 12/15/12 0002  GLUCAP 163* 130* 169*    Prophylaxis: pharmacologic prophylaxis (with any of the following: enoxaparin (Lovenox) 40mg  SQ 2 hours prior to surgery then every day) and intermittent pneumatic compression boots.  Plan: Advance diet Encourage ambulation Advance to PO medication Discontinue IV fluids Dulcolax suppository x1 CBGs and sliding scale to AC/HS Restart home medications Continue post operative plan of care   LOS: 2 days    Adline Kirshenbaum DEAL 12/15/2012, 10:09 AM

## 2012-12-16 DIAGNOSIS — E119 Type 2 diabetes mellitus without complications: Secondary | ICD-10-CM | POA: Diagnosis present

## 2012-12-16 MED ORDER — HYDROCODONE-ACETAMINOPHEN 5-325 MG PO TABS: 1.0000 | ORAL_TABLET | ORAL | Status: DC | PRN

## 2012-12-16 NOTE — Progress Notes (Signed)
Patient is alert ans oriented, vital signs are stable, patient is tolerating her diet with no complaints of nausea or vomiting, incision are within normal limits, patient is voiding adequately, JP drain removed, prescription given and patient to follow up with MD Stanford Breed RN 12-16-2012 11:21am

## 2012-12-16 NOTE — Discharge Summary (Signed)
Physician Discharge Summary  Patient ID: Monica Allen MRN: [id number] DOB/AGE: 1956-04-30 57 y.o.  Admit date: 12/13/2012 Discharge date: 12/16/2012  Admission Diagnoses: Ovarian cystic mass  Discharge Diagnoses:  Principal Problem:  *Ovarian cystic mass Active Problems:  Diabetes mellitus  Discharged Condition:  The patient is in good condition and stable for discharge.  Hospital Course: On 12/13/2012, the patient underwent the following: Procedure(s): EXPLORATORY LAPAROTOMY, HYSTERECTOMY ABDOMINAL, SALPINGO OOPHORECTOMY, OMENTECTOMY.  The postoperative course was uneventful.  She was discharged to home on postoperative day 3 tolerating a carb modified diet.  JP drain removed prior to discharge.  She is to return in 2 weeks for a follow up appointment with [name] and for staple removal.  Consults:  None, recommendations provided by the Diabetes Coordinator.  Significant Diagnostic Studies: None  Treatments: surgery: see above  Discharge Exam: Blood pressure 115/71, pulse 71, temperature 98.8 F (37.1 C), temperature source Oral, resp. rate 20, height 5' 3.5" (1.613 m), weight 289 lb (131.09 kg), last menstrual period 10/12/2012, SpO2 98.00%. General appearance: alert, cooperative and no distress Resp: clear to auscultation bilaterally Cardio: regular rate and rhythm, S1, S2 normal, no murmur, click, rub or gallop GI: soft, non-tender; bowel sounds normal; no masses,  no organomegaly and abdomen morbidly obese Extremities: extremities normal, atraumatic, no cyanosis or edema Incision/Wound: Midline incision with staples clean, dry, and intact.  JP removed without difficulty.  Dressing applied.  Disposition: 01-Home or Self Care  Discharge Orders    Future Appointments: Provider: Department: Dept Phone: Center:   12/27/2012 3:30 PM Jeannette Corpus, MD [facility] GYNECOLOGICAL ONCOLOGY [phone] None   01/06/2013 3:30 PM Jeannette Corpus, MD  [facility] GYNECOLOGICAL ONCOLOGY [phone] None     Future Orders Please Complete By Expires   Diet - low sodium heart healthy      Increase activity slowly      Driving Restrictions      Comments:   No driving for 2 weeks.  Do not take narcotics and drive.   Lifting restrictions      Comments:   No lifting greater than 10 lbs.   Sexual Activity Restrictions      Comments:   No sexual activity, nothing in the vagina, for 6 weeks.   Call MD for:  temperature >100.4      Call MD for:  persistant nausea and vomiting      Call MD for:  severe uncontrolled pain      Call MD for:  redness, tenderness, or signs of infection (pain, swelling, redness, odor or green/yellow discharge around incision site)      Call MD for:  difficulty breathing, headache or visual disturbances      Call MD for:  hives      Call MD for:  persistant dizziness or light-headedness      Call MD for:  extreme fatigue          Medication List     As of 12/16/2012 10:40 AM    TAKE these medications         aspirin EC 81 MG tablet   Take 81 mg by mouth every morning.      carvedilol 80 MG 24 hr capsule   Commonly known as: COREG CR   Take 80 mg by mouth daily with breakfast.      furosemide 80 MG tablet   Commonly known as: LASIX   Take 80 mg by mouth daily with breakfast.  glipiZIDE 10 MG tablet   Commonly known as: GLUCOTROL   Take 10 mg by mouth daily.      HYDROcodone-acetaminophen 5-325 MG per tablet   Commonly known as: NORCO/VICODIN   Take 1-2 tablets by mouth every 4 (four) hours as needed (moderate to severe pain).      JENTADUETO 2.03-999 MG Tabs   Generic drug: Linagliptin-Metformin HCl   Take 1 tablet by mouth 2 (two) times daily.      polyethylene glycol packet   Commonly known as: MIRALAX / GLYCOLAX   Take 17 g by mouth daily.      potassium chloride SA 20 MEQ tablet   Commonly known as: K-DUR,KLOR-CON   Take 20 mEq by mouth daily.      ramipril 10 MG  tablet   Commonly known as: ALTACE   Take 10 mg by mouth daily with breakfast.      traMADol 50 MG tablet   Commonly known as: ULTRAM   Take 50 mg by mouth every 6 (six) hours as needed.      valsartan 320 MG tablet   Commonly known as: DIOVAN   Take 320 mg by mouth daily with breakfast.           Follow-up Information    Follow up with Jeannette Corpus, MD. On 12/27/2012. (at 3:30pm)    Contact information:   [address] [place] [address] [phone]          Signed: CROSS, MELISSA DEAL 12/16/2012, 10:40 AM

## 2012-12-17 LAB — TYPE AND SCREEN

## 2012-12-19 ENCOUNTER — Telehealth: Payer: Self-pay | Admitting: Gynecologic Oncology

## 2012-12-19 NOTE — Telephone Encounter (Signed)
Called to check on the patient post-operatively.  No answer and unable to leave a message.  Will re-attempt at a later date.

## 2012-12-27 ENCOUNTER — Ambulatory Visit: Payer: BC Managed Care – PPO | Attending: Gynecology | Admitting: Gynecology

## 2012-12-27 ENCOUNTER — Encounter (HOSPITAL_COMMUNITY): Payer: Self-pay | Admitting: Blood Banking & Transfusion Medicine

## 2012-12-27 ENCOUNTER — Encounter: Payer: Self-pay | Admitting: Gynecology

## 2012-12-27 VITALS — BP 128/64 | HR 68 | Temp 98.7°F | Resp 20 | Ht 63.58 in | Wt 256.0 lb

## 2012-12-27 DIAGNOSIS — C569 Malignant neoplasm of unspecified ovary: Secondary | ICD-10-CM

## 2012-12-27 NOTE — Patient Instructions (Signed)
We will call you to schedule a colonoscopy in approximately 2 weeks. We'll also schedule of surgical checkup in approximately 6 weeks.

## 2012-12-27 NOTE — Progress Notes (Signed)
Consult Note: Gyn-Onc   Monica Allen 57 y.o. female  Chief Complaint  Patient presents with  . Ovarian Cancer    Interval History: The patient returns today for initial postoperative followup and to discuss her surgical pathologic findings. From a surgical point of view she is recovering well. She is no longer taking pain medication. She is having some constipation and is using MiraLAX. She denies any nausea vomiting. Her midline skin incision is healing well.  HPI: The patient underwent exploratory laparotomy on 12/13/2012 for large abdominal mass measuring 25 x 23 x 10 cm. Final pathology showed a moderately differentiated adenocarcinoma with extensive tumor necrosis. Immunohistochemical stains were more consistent with a colonic primary. There was 1 implant on the pelvic peritoneum which was a metastatic site.  Review of Systems:10 point review of systems is negative as noted above.   Vitals: Blood pressure 128/64, pulse 68, temperature 98.7 F (37.1 C), temperature source Oral, resp. rate 20, height 5' 3.58" (1.615 m), weight 116.121 kg (256 lb), last menstrual period 10/07/2012.  Physical Exam: General : The patient is a healthy woman in no acute distress.  HEENT: normocephalic, extraoccular movements normal; neck is supple without thyromegally  Lynphnodes: Supraclavicular and inguinal nodes not enlarged  Abdomen: Soft, non-tender, no ascites, no organomegally, no masses, no hernias midline incision is healing well. Staples were removed and Steri-Strips were applied.   Assessment/Plan: Moderately differentiated adenocarcinoma involving the ovary. Given the immunohistochemical staining pattern, we must exclude the possibility of the patient having a: Cancer. It is noted that her CEA 2 days postoperatively was 1.4.  We will arrange for her to have a colonoscopy in approximately 2 weeks. In the interval she'll continue to recover is encouraged to ambulate more and use MiraLAX that she  needs to for constipation. We'll schedule her back for surgical checkup at approximate 6 weeks.  Allergies  Allergen Reactions  . Actos (Pioglitazone)     Heart doctor told me not to take this drug.   . Ibuprofen     "made my stomach hurt"    Past Medical History  Diagnosis Date  . Diabetes mellitus   . Hypertension   . Enlarged heart   . Goiter   . Nonischemic cardiomyopathy     Past Surgical History  Procedure Laterality Date  . Wisdom tooth extraction    . Laparotomy  12/13/2012    Procedure: EXPLORATORY LAPAROTOMY;  Surgeon: Jeannette Corpus, MD;  Location: [facility];  Service: Gynecology;  Laterality: N/A;  . Abdominal hysterectomy  12/13/2012    Procedure: HYSTERECTOMY ABDOMINAL;  Surgeon: Jeannette Corpus, MD;  Location: [facility];  Service: Gynecology;  Laterality: N/A;  . Salpingoophorectomy  12/13/2012    Procedure: SALPINGO OOPHORECTOMY;  Surgeon: Jeannette Corpus, MD;  Location: [facility];  Service: Gynecology;  Laterality: Bilateral;  . Omentectomy  12/13/2012    Procedure: OMENTECTOMY;  Surgeon: Jeannette Corpus, MD;  Location: [facility];  Service: Gynecology;  Laterality: N/A;    Current Outpatient Prescriptions  Medication Sig Dispense Refill  . aspirin EC 81 MG tablet Take 81 mg by mouth every morning.       . carvedilol (COREG CR) 80 MG 24 hr capsule Take 80 mg by mouth daily with breakfast.       . furosemide (LASIX) 80 MG tablet Take 80 mg by mouth daily with breakfast.       . glipiZIDE (GLUCOTROL) 10 MG tablet Take 10 mg by mouth daily.       Marland Kitchen  HYDROcodone-acetaminophen (NORCO/VICODIN) 5-325 MG per tablet Take 1-2 tablets by mouth every 4 (four) hours as needed (moderate to severe pain).  70 tablet  0  . Linagliptin-Metformin HCl (JENTADUETO) 2.03-999 MG TABS Take 1 tablet by mouth 2 (two) times daily.      . polyethylene glycol (MIRALAX / GLYCOLAX) packet Take 17 g by mouth daily.      . potassium chloride SA (K-DUR,KLOR-CON) 20 MEQ tablet Take  20 mEq by mouth daily.       . ramipril (ALTACE) 10 MG tablet Take 10 mg by mouth daily with breakfast.       . traMADol (ULTRAM) 50 MG tablet Take 50 mg by mouth every 6 (six) hours as needed.      . valsartan (DIOVAN) 320 MG tablet Take 320 mg by mouth daily with breakfast.        No current facility-administered medications for this visit.    History   Social History  . Marital Status: Married    Spouse Name: N/A    Number of Children: N/A  . Years of Education: N/A   Occupational History  . Not on file.   Social History Main Topics  . Smoking status: Former Smoker    Quit date: 12/10/1987  . Smokeless tobacco: Not on file     Comment: quit 22 years ago  . Alcohol Use: Yes     Comment: occasionally  . Drug Use: No  . Sexually Active: Not on file   Other Topics Concern  . Not on file   Social History Narrative  . No narrative on file    Family History  Problem Relation Age of Onset  . Diabetes Mother   . Hypertension Mother   . Diabetes Sister   . Hypertension Sister   . Hypertension Brother   . Stroke Maternal Aunt   . Diabetes Maternal Grandmother   . Hypertension Maternal Grandmother       Jeannette Corpus, MD 12/27/2012, 4:03 PM

## 2012-12-27 NOTE — Progress Notes (Signed)
Patient ID: Monica Allen, female   DOB: 17-Aug-1956, 57 y.o.   MRN: [id number]

## 2013-01-06 ENCOUNTER — Ambulatory Visit: Payer: BC Managed Care – PPO | Admitting: Gynecology

## 2013-02-06 ENCOUNTER — Encounter: Payer: Self-pay | Admitting: Gynecology

## 2013-02-06 ENCOUNTER — Ambulatory Visit: Payer: BC Managed Care – PPO | Attending: Gynecology | Admitting: Gynecology

## 2013-02-06 VITALS — BP 110/62 | HR 80 | Temp 98.4°F | Resp 22 | Ht 63.58 in | Wt 235.8 lb

## 2013-02-06 DIAGNOSIS — Z90710 Acquired absence of both cervix and uterus: Secondary | ICD-10-CM | POA: Insufficient documentation

## 2013-02-06 DIAGNOSIS — C569 Malignant neoplasm of unspecified ovary: Secondary | ICD-10-CM | POA: Insufficient documentation

## 2013-02-06 NOTE — Patient Instructions (Signed)
Colonoscopy will be scheduled as soon as possible.

## 2013-02-06 NOTE — Progress Notes (Signed)
Consult Note: Gyn-Onc   Monica Allen 57 y.o. female  Chief Complaint  Patient presents with  . 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 We are awaiting colonoscopy to make a final decision as to which malignancy we are treating. Given that she had a peritoneal metastasis, chemotherapy would be advised.  Plan: Colonoscopy as soon as possible. Once we have that result we'll make further recommendations regarding chemotherapy.  Interval History: The patient returns today for six-week postoperative checkup. Since her last visit she's done well. Her appetite has diminished. She has normal GI and GU function. Is noted that she has lost approximately 20 pounds in the past month. She has yet toschedule and have a colonoscopy as previously advised.  HPI:The patient underwent exploratory laparotomy on 12/13/2012 for large abdominal mass measuring 25 x 23 x 10 cm. Final pathology showed a moderately differentiated adenocarcinoma with extensive tumor necrosis. Immunohistochemical stains were more consistent with a colonic primary. There was 1 implant on the pelvic peritoneum which was a metastatic site.      Review of Systems:10 point review of systems is negative except as noted in interval history.   Vitals: Blood pressure 110/62, pulse 80, temperature 98.4 F (36.9 C), resp. rate 22, height 5' 3.58" (1.615 m), weight 235 lb 12.8 oz (106.958 kg), last menstrual period 10/07/2012.  Physical Exam: General : The patient is a healthy woman in no acute distress.  HEENT: normocephalic, extraoccular movements normal; neck is supple without thyromegally  Lynphnodes: Supraclavicular and inguinal nodes not enlarged  Abdomen: Soft, non-tender, no ascites, no organomegally, no masses, no hernias , midline incision is healed well Pelvic:  EGBUS: Normal female  Vagina: Normal, no lesions cuff is completely healed. Urethra and Bladder: Normal, non-tender   Cervix: Surgically absent  Uterus: Surgically absent  Bi-manual examination: Non-tender; no adenxal masses or nodularity  Rectal: normal sphincter tone, no masses, no blood  Lower extremities: No edema or varicosities. Normal range of motion      Allergies  Allergen Reactions  . Actos (Pioglitazone)     Heart doctor told me not to take this drug.   . Ibuprofen     "made my stomach hurt"    Past Medical History  Diagnosis Date  . Diabetes mellitus   . Hypertension   . Enlarged heart   . Goiter   . Nonischemic cardiomyopathy     Past Surgical History  Procedure Laterality Date  . Wisdom tooth extraction    . Laparotomy  12/13/2012    Procedure: EXPLORATORY LAPAROTOMY;  Surgeon: Jeannette Corpus, MD;  Location: [facility];  Service: Gynecology;  Laterality: N/A;  . Abdominal hysterectomy  12/13/2012    Procedure: HYSTERECTOMY ABDOMINAL;  Surgeon: Jeannette Corpus, MD;  Location: [facility];  Service: Gynecology;  Laterality: N/A;  . Salpingoophorectomy  12/13/2012    Procedure: SALPINGO OOPHORECTOMY;  Surgeon: Jeannette Corpus, MD;  Location: [facility];  Service: Gynecology;  Laterality: Bilateral;  . Omentectomy  12/13/2012    Procedure: OMENTECTOMY;  Surgeon: Jeannette Corpus, MD;  Location: [facility];  Service: Gynecology;  Laterality: N/A;    Current Outpatient Prescriptions  Medication Sig Dispense Refill  . aspirin EC 81 MG tablet Take 81 mg by mouth every morning.       . carvedilol (COREG CR) 80 MG 24 hr capsule Take 80 mg by mouth daily with breakfast.       .  furosemide (LASIX) 80 MG tablet Take 80 mg by mouth daily with breakfast.       . glipiZIDE (GLUCOTROL) 10 MG tablet Take 10 mg by mouth daily.       . Linagliptin-Metformin HCl (JENTADUETO) 2.03-999 MG TABS Take 1 tablet by mouth 2 (two) times daily.      . potassium chloride SA (K-DUR,KLOR-CON) 20 MEQ tablet Take 20 mEq by mouth daily.       . ramipril (ALTACE) 10 MG tablet Take 10 mg by mouth  daily with breakfast.       . traMADol (ULTRAM) 50 MG tablet Take 50 mg by mouth every 6 (six) hours as needed.      . valsartan (DIOVAN) 320 MG tablet Take 320 mg by mouth daily with breakfast.       . HYDROcodone-acetaminophen (NORCO/VICODIN) 5-325 MG per tablet Take 1-2 tablets by mouth every 4 (four) hours as needed (moderate to severe pain).  70 tablet  0  . polyethylene glycol (MIRALAX / GLYCOLAX) packet Take 17 g by mouth daily.       No current facility-administered medications for this visit.    History   Social History  . Marital Status: Married    Spouse Name: N/A    Number of Children: N/A  . Years of Education: N/A   Occupational History  . Not on file.   Social History Main Topics  . Smoking status: Former Smoker    Quit date: 12/10/1987  . Smokeless tobacco: Not on file     Comment: quit 22 years ago  . Alcohol Use: Yes     Comment: occasionally  . Drug Use: No  . Sexually Active: Not on file   Other Topics Concern  . Not on file   Social History Narrative  . No narrative on file    Family History  Problem Relation Age of Onset  . Diabetes Mother   . Hypertension Mother   . Diabetes Sister   . Hypertension Sister   . Hypertension Brother   . Stroke Maternal Aunt   . Diabetes Maternal Grandmother   . Hypertension Maternal Grandmother       Jeannette Corpus, MD 02/06/2013, 11:54 AM

## 2013-02-08 ENCOUNTER — Ambulatory Visit (INDEPENDENT_AMBULATORY_CARE_PROVIDER_SITE_OTHER): Payer: BC Managed Care – PPO | Admitting: Gastroenterology

## 2013-02-08 ENCOUNTER — Telehealth: Payer: Self-pay

## 2013-02-08 ENCOUNTER — Other Ambulatory Visit: Payer: Self-pay

## 2013-02-08 ENCOUNTER — Encounter: Payer: Self-pay | Admitting: Gastroenterology

## 2013-02-08 VITALS — BP 123/65 | HR 72 | Temp 97.6°F | Ht 63.0 in | Wt 239.2 lb

## 2013-02-08 DIAGNOSIS — Z1211 Encounter for screening for malignant neoplasm of colon: Secondary | ICD-10-CM

## 2013-02-08 NOTE — Patient Instructions (Addendum)
We have set you up for a colonoscopy with [name] in the near future.  Do not take any diabetes medication the morning of the procedure.

## 2013-02-08 NOTE — Telephone Encounter (Signed)
Opened in error

## 2013-02-08 NOTE — Progress Notes (Addendum)
Referring Provider: [name], PA-C Primary Care Physician:  [name], PA-C Primary Gastroenterologist:  [name]   Chief Complaint  Patient presents with  . Constipation  . Colonoscopy    HPI:   Monica Allen is a pleasant 57 year old female who has been referred to our practice by [name]. She underwent an exploratory laparotomy on Dec 13, 2012 secondary to a large abdominal mass. 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 Concern raised for colon as primary. There was also peritoneal metastasis. She is here for an office visit prior to colonoscopy to further evaluate for possible source of malignancy, which will aid in final decision-making of future treatments. She has an appt upcoming with Dr. Mariel Allen April 11.   She states she is scared, as her uncle had a colonoscopy in the past with perforation. About 6 years ago. Sometimes lower abdominal discomfort prior to BMs. Notes constipation, chronic. No rectal bleeding. Feels like bowel movements are "going up a hill" to come out. +wt loss reported from 285 to 239. States she is eating, good appetite. Changed eating habits to smaller portions.    Past Medical History  Diagnosis Date  . Diabetes mellitus   . Hypertension   . Enlarged heart   . Goiter   . Nonischemic cardiomyopathy     Past Surgical History  Procedure Laterality Date  . Wisdom tooth extraction    . Laparotomy  12/13/2012    Procedure: EXPLORATORY LAPAROTOMY;  Surgeon: Jeannette Corpus, MD;  Location: [facility];  Service: Gynecology;  Laterality: N/A;  . Abdominal hysterectomy  12/13/2012    Procedure: HYSTERECTOMY ABDOMINAL;  Surgeon: Jeannette Corpus, MD;  Location: [facility];  Service: Gynecology;  Laterality: N/A;  . Salpingoophorectomy  12/13/2012    Procedure: SALPINGO OOPHORECTOMY;  Surgeon: Jeannette Corpus, MD;  Location: [facility];  Service:  Gynecology;  Laterality: Bilateral;  . Omentectomy  12/13/2012    Procedure: OMENTECTOMY;  Surgeon: Jeannette Corpus, MD;  Location: [facility];  Service: Gynecology;  Laterality: N/A;    Current Outpatient Prescriptions  Medication Sig Dispense Refill  . aspirin EC 81 MG tablet Take 81 mg by mouth every morning.       . carvedilol (COREG CR) 80 MG 24 hr capsule Take 80 mg by mouth daily with breakfast.       . furosemide (LASIX) 80 MG tablet Take 80 mg by mouth daily with breakfast.       . glipiZIDE (GLUCOTROL) 10 MG tablet Take 10 mg by mouth daily.       . Linagliptin-Metformin HCl (JENTADUETO) 2.03-999 MG TABS Take 1 tablet by mouth 2 (two) times daily.      . ramipril (ALTACE) 10 MG tablet Take 10 mg by mouth daily with breakfast.       . valsartan (DIOVAN) 320 MG tablet Take 320 mg by mouth daily with breakfast.       . HYDROcodone-acetaminophen (NORCO/VICODIN) 5-325 MG per tablet Take 1-2 tablets by mouth every 4 (four) hours as needed (moderate to severe pain).  70 tablet  0  . peg 3350 powder (MOVIPREP) 100 G SOLR Take 1 kit (100 g total) by mouth as directed.  1 kit  0  . polyethylene glycol (MIRALAX / GLYCOLAX) packet Take 17 g by mouth daily.      . potassium chloride SA (K-DUR,KLOR-CON) 20 MEQ tablet Take 20 mEq by mouth daily.       Marland Kitchen  traMADol (ULTRAM) 50 MG tablet Take 50 mg by mouth every 6 (six) hours as needed for pain.        No current facility-administered medications for this visit.    Allergies as of 02/08/2013 - Review Complete 02/08/2013  Allergen Reaction Noted  . Actos (pioglitazone)  12/13/2012  . Ibuprofen  11/23/2012    Family History  Problem Relation Age of Onset  . Diabetes Mother   . Hypertension Mother   . Diabetes Sister   . Hypertension Sister   . Hypertension Brother   . Stroke Maternal Aunt   . Diabetes Maternal Grandmother   . Hypertension Maternal Grandmother   . Colon cancer Neg Hx     History   Social History  . Marital Status:  Married    Spouse Name: N/A    Number of Children: N/A  . Years of Education: N/A   Occupational History  . self-employed     childcare provider   Social History Main Topics  . Smoking status: Former Smoker    Quit date: 12/10/1987  . Smokeless tobacco: Not on file     Comment: quit 22 years ago  . Alcohol Use: Yes     Comment: occasionally  . Drug Use: No  . Sexually Active: Not on file   Other Topics Concern  . Not on file   Social History Narrative  . No narrative on file    Review of Systems: Gen: SEE HPI CV: Denies chest pain, heart palpitations, syncope, peripheral edema. Resp: Denies shortness of breath with rest, cough, wheezing GI: SEE HPI GU : Denies urinary burning, urinary frequency, urinary incontinence.  MS: Denies joint pain, muscle weakness, cramps, limited movement Derm: Denies rash, itching, dry skin Psych: Denies depression, anxiety, confusion or memory loss  Heme: Denies bruising, bleeding, and enlarged lymph nodes.  Physical Exam: BP 123/65  Pulse 72  Temp(Src) 97.6 F (36.4 C) (Oral)  Ht 5\' 3"  (1.6 m)  Wt 239 lb 3.2 oz (108.5 kg)  BMI 42.38 kg/m2  LMP 10/07/2012 General:   Alert and oriented. Well-developed, well-nourished, pleasant and cooperative. Head:  Normocephalic and atraumatic. Eyes:  Conjunctiva pink, sclera clear, no icterus.   Conjunctiva pink. Ears:  Normal auditory acuity. Nose:  No deformity, discharge,  or lesions. Mouth:  No deformity or lesions, mucosa pink and moist.  Neck:  Supple, without mass. Large goiter Lungs:  Clear to auscultation bilaterally, without wheezing, rales, or rhonchi.  Heart:  S1, S2 present without murmurs noted.  Abdomen:  +BS, soft, OBESE, well-healed midline surgical incision, non-tender and non-distended. No rebound or guarding. Rectal:  Deferred  Msk:  Symmetrical without gross deformities. Normal posture. Pulses:  Normal pulses noted. Extremities:  Without clubbing or edema. Neurologic:   Alert and  oriented x4;  grossly normal neurologically. Skin:  Intact, warm and dry without significant lesions or rashes Cervical Nodes:  No significant cervical adenopathy. Psych:  Alert and cooperative. Normal mood and affect.

## 2013-02-09 ENCOUNTER — Telehealth: Payer: Self-pay

## 2013-02-09 ENCOUNTER — Encounter (HOSPITAL_COMMUNITY): Payer: Self-pay | Admitting: Pharmacy Technician

## 2013-02-09 MED ORDER — PEG-KCL-NACL-NASULF-NA ASC-C 100 G PO SOLR: 1.0000 | ORAL | Status: DC

## 2013-02-09 NOTE — Assessment & Plan Note (Signed)
Very pleasant 57 year old female who unfortunately was found to have a large abdominal mass, requiring exploratory laparotomy by [name]. 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 Concern raised for colon as primary. There was also peritoneal metastasis. She has an appt upcoming with Dr. Mariel Sleet April 11. She states constipation is chronic, otherwise no rectal bleeding. It appears she is not completely aware of the journey that lays ahead of her, as she had multiple questions for me, many of which I could not answer (what kind of treatments, how long, etc). She is agreeable and understands the need for a colonoscopy as soon as possible. She states her husband is a good support person.   Proceed with colonoscopy with [name] as soon as possible. The risks, benefits, and alternatives have been discussed in detail with the patient. They state understanding and desire to proceed.

## 2013-02-09 NOTE — Telephone Encounter (Signed)
Sent the Rx for Movie Prep to [facility] pharmacy. ( It did not go through the first time).

## 2013-02-10 NOTE — Progress Notes (Signed)
CC PCP 

## 2013-02-13 ENCOUNTER — Telehealth: Payer: Self-pay

## 2013-02-13 NOTE — Telephone Encounter (Signed)
Called [facility] at [phone] and spoke to [place] C, who said that a PA is not required to have a screening colonoscopy.  She also asked me to call pt and let her know, she needs to call their office and answer some questions about any additional insurance. If she doesn't, there will be delay in processing the payments.   I tried to call pt, many rings and no answer.

## 2013-02-14 ENCOUNTER — Encounter (HOSPITAL_COMMUNITY): Payer: Self-pay | Admitting: *Deleted

## 2013-02-14 ENCOUNTER — Ambulatory Visit (HOSPITAL_COMMUNITY)
Admission: RE | Admit: 2013-02-14 | Discharge: 2013-02-14 | Disposition: A | Discharge status: Home / Self Care | Payer: BC Managed Care – PPO | Source: Ambulatory Visit | Attending: Gastroenterology | Admitting: Gastroenterology

## 2013-02-14 ENCOUNTER — Encounter (HOSPITAL_COMMUNITY)
Admission: RE | Disposition: A | Discharge status: Home / Self Care | Payer: Self-pay | Source: Ambulatory Visit | Attending: Gastroenterology

## 2013-02-14 DIAGNOSIS — E119 Type 2 diabetes mellitus without complications: Secondary | ICD-10-CM | POA: Insufficient documentation

## 2013-02-14 DIAGNOSIS — C801 Malignant (primary) neoplasm, unspecified: Secondary | ICD-10-CM

## 2013-02-14 DIAGNOSIS — C569 Malignant neoplasm of unspecified ovary: Secondary | ICD-10-CM | POA: Insufficient documentation

## 2013-02-14 DIAGNOSIS — K639 Disease of intestine, unspecified: Secondary | ICD-10-CM

## 2013-02-14 DIAGNOSIS — K5900 Constipation, unspecified: Secondary | ICD-10-CM

## 2013-02-14 DIAGNOSIS — I517 Cardiomegaly: Secondary | ICD-10-CM | POA: Insufficient documentation

## 2013-02-14 DIAGNOSIS — E041 Nontoxic single thyroid nodule: Secondary | ICD-10-CM | POA: Insufficient documentation

## 2013-02-14 DIAGNOSIS — Z886 Allergy status to analgesic agent status: Secondary | ICD-10-CM | POA: Insufficient documentation

## 2013-02-14 DIAGNOSIS — Z87891 Personal history of nicotine dependence: Secondary | ICD-10-CM | POA: Insufficient documentation

## 2013-02-14 DIAGNOSIS — Z7982 Long term (current) use of aspirin: Secondary | ICD-10-CM | POA: Insufficient documentation

## 2013-02-14 DIAGNOSIS — D126 Benign neoplasm of colon, unspecified: Secondary | ICD-10-CM

## 2013-02-14 DIAGNOSIS — Z79899 Other long term (current) drug therapy: Secondary | ICD-10-CM | POA: Insufficient documentation

## 2013-02-14 DIAGNOSIS — I10 Essential (primary) hypertension: Secondary | ICD-10-CM | POA: Insufficient documentation

## 2013-02-14 DIAGNOSIS — I428 Other cardiomyopathies: Secondary | ICD-10-CM | POA: Insufficient documentation

## 2013-02-14 DIAGNOSIS — Z1211 Encounter for screening for malignant neoplasm of colon: Secondary | ICD-10-CM | POA: Insufficient documentation

## 2013-02-14 DIAGNOSIS — K648 Other hemorrhoids: Secondary | ICD-10-CM | POA: Insufficient documentation

## 2013-02-14 HISTORY — PX: COLONOSCOPY: SHX5424

## 2013-02-14 SURGERY — COLONOSCOPY
Anesthesia: Moderate Sedation

## 2013-02-14 MED ORDER — MEPERIDINE HCL 100 MG/ML IJ SOLN
INTRAMUSCULAR | Status: DC | PRN
  Administered 2013-02-14 (×2): 25 mg via INTRAVENOUS

## 2013-02-14 MED ORDER — MIDAZOLAM HCL 5 MG/5ML IJ SOLN
INTRAMUSCULAR | Status: AC
  Filled 2013-02-14: qty 10

## 2013-02-14 MED ORDER — PROMETHAZINE HCL 25 MG/ML IJ SOLN
INTRAMUSCULAR | Status: AC
  Filled 2013-02-14: qty 1

## 2013-02-14 MED ORDER — STERILE WATER FOR IRRIGATION IR SOLN
Status: DC | PRN
  Administered 2013-02-14: 11:00:00

## 2013-02-14 MED ORDER — MIDAZOLAM HCL 5 MG/5ML IJ SOLN
INTRAMUSCULAR | Status: DC | PRN
  Administered 2013-02-14: 1 mg via INTRAVENOUS
  Administered 2013-02-14: 2 mg via INTRAVENOUS

## 2013-02-14 MED ORDER — MEPERIDINE HCL 100 MG/ML IJ SOLN
INTRAMUSCULAR | Status: AC
  Filled 2013-02-14: qty 2

## 2013-02-14 MED ORDER — SPOT INK MARKER SYRINGE KIT
PACK | SUBMUCOSAL | Status: DC | PRN
  Administered 2013-02-14: 5 mL via SUBMUCOSAL

## 2013-02-14 MED ORDER — SODIUM CHLORIDE 0.9 % IV SOLN
INTRAVENOUS | Status: DC
  Administered 2013-02-14: 11:00:00 via INTRAVENOUS

## 2013-02-14 NOTE — Op Note (Signed)
[facility] [address] [place], [address]   COLONOSCOPY PROCEDURE REPORT  PATIENT: Monica, Allen  MR#: [id number] BIRTHDATE: [birth year] , 57  yrs. old GENDER: Female ENDOSCOPIST: Jonette Eva, MD REFERRED AO:ZHYQMV Stanford Breed, M.D.  Glenford Peers, M.D. Karleen Hampshire, M.D. PROCEDURE DATE:  02/14/2013 PROCEDURE:   Colonoscopy with biopsy and Colonoscopy with snare polypectomy INDICATIONS: L ADENEXAL ADENO CA -?COLON PRIMARY.  OCCASIONAL CONSTIPATION.  LAST CT DEC 2013 MEDICATIONS: Demerol 50 mg IV and Versed 3 mg IV DESCRIPTION OF PROCEDURE:    Physical exam was performed.  Informed consent was obtained from the patient after explaining the benefits, risks, and alternatives to procedure.  The patient was connected to monitor and placed in left lateral position. Continuous oxygen was provided by nasal cannula and IV medicine administered through an indwelling cannula.  After administration of sedation and rectal exam, the patients rectum was intubated and the EC-3890Li ([id number]) and EG-2990i ([id number])  colonoscope was advanced under direct visualization to the cecum.  The scope was removed slowly by carefully examining the color, texture, anatomy, and integrity mucosa on the way out.  The patient was recovered in endoscopy and discharged home in satisfactory condition.    COLON FINDINGS: A near OBSTRUCTING fungating mass was found in the ascending colon.  Multiple biopsies were performed using JUMBO forceps. Two sessile polyps measuring 6-8 mm in size were found in the ascending colon. THEY WERE NOT REMOVED. 1 CC SPOT PLACED DISTAL TO MASS AND 1 CC SPOT DISTAL TOTHE 2 REMAINING POLYPS  A SESSIEL polyp measuring 6 mm in size was found in the proximal transverse colon. & REMOVED VIA snare cautery.  The resection was complete and the polyp tissue was completely retrieved, A pedunculated polyp measuring 1.5 cm in size was found in the sigmoid colon &  REMOVED VIAectomy snare cautery.  1 CC SPOT WAS PLACED AT BASE OF POLYPECTOMY SITE. The resection was complete and the polyp tissue was completely retrieved, and Small internal hemorrhoids were found.  PREP QUALITY: good. CECAL W/D TIME: 25 minutes     COMPLICATIONS: None  ENDOSCOPIC IMPRESSION: 1.   Near circumferential mass  in the ascending colon. TWO POLYPS REMAIN IN THE VICINITY OF THE MASS 2.   Polyp REMOVED FROM  transverse colon 3.  LARGE Pedunculated polyp REMOVED FROM the sigmoid colony  RECOMMENDATIONS: SEE GENERAL SURGERY TO DISCUSS RIGHT COLON SURGERY. BIOPSY WILL BE BACK IN 3-5 DAYS. CTAP W/ IV/ORAL CONTRAST, & A CHEST XRAY ON APR 10. COLONOSCOPY IN 1 YEAR. ALL FIRST DEGREE RELATIVES NEED A COLONOSCOPY AT AGE 69 AND THEN EVERY 5 YEARS.  DISCUSSED FINDINGS WITH DR.  Mariel Sleet.       _______________________________ eSignedJonette Eva, MD 02/14/2013 12:48 PM     PATIENT NAME:  Monica, Allen MR#: [id number]

## 2013-02-14 NOTE — Telephone Encounter (Signed)
Mailed a letter to pt to remind to call her insurance.

## 2013-02-14 NOTE — H&P (Addendum)
Primary Care Physician:  [name], PA-C Primary Gastroenterologist:  [name]  Pre-Procedure History & Physical: HPI:  Monica Allen is a 57 y.o. female here for COLON CANCER SCREENING/OVARIAN MASS REMOVED ADENOCA/?COLON PRIMARY.  Past Medical History  Diagnosis Date  . Diabetes mellitus   . Hypertension   . Enlarged heart   . Goiter   . Nonischemic cardiomyopathy     Past Surgical History  Procedure Laterality Date  . Wisdom tooth extraction    . Laparotomy  12/13/2012    Procedure: EXPLORATORY LAPAROTOMY;  Surgeon: Jeannette Corpus, MD;  Location: [facility];  Service: Gynecology;  Laterality: N/A;  . Abdominal hysterectomy  12/13/2012    Procedure: HYSTERECTOMY ABDOMINAL;  Surgeon: Jeannette Corpus, MD;  Location: [facility];  Service: Gynecology;  Laterality: N/A;  . Salpingoophorectomy  12/13/2012    Procedure: SALPINGO OOPHORECTOMY;  Surgeon: Jeannette Corpus, MD;  Location: [facility];  Service: Gynecology;  Laterality: Bilateral;  . Omentectomy  12/13/2012    Procedure: OMENTECTOMY;  Surgeon: Jeannette Corpus, MD;  Location: [facility];  Service: Gynecology;  Laterality: N/A;    Prior to Admission medications   Medication Sig Start Date End Date Taking? Authorizing Provider  aspirin EC 81 MG tablet Take 81 mg by mouth every morning.    Yes Historical Provider, MD  carvedilol (COREG CR) 80 MG 24 hr capsule Take 80 mg by mouth daily with breakfast.    Yes Historical Provider, MD  furosemide (LASIX) 80 MG tablet Take 80 mg by mouth daily with breakfast.    Yes Historical Provider, MD  glipiZIDE (GLUCOTROL) 10 MG tablet Take 10 mg by mouth daily.    Yes Historical Provider, MD  Linagliptin-Metformin HCl (JENTADUETO) 2.03-999 MG TABS Take 1 tablet by mouth 2 (two) times daily.   Yes Historical Provider, MD  peg 3350 powder (MOVIPREP) 100 G SOLR Take 1 kit (100 g total) by mouth as directed. 02/08/13  Yes Nira Retort, NP  peg 3350 powder (MOVIPREP) 100 G SOLR Take  1 kit (100 g total) by mouth as directed. 02/09/13  Yes Corbin Ade, MD  polyethylene glycol [facility] Ethelene Hal) packet Take 17 g by mouth daily.   Yes Historical Provider, MD  potassium chloride SA (K-DUR,KLOR-CON) 20 MEQ tablet Take 20 mEq by mouth daily.    Yes Historical Provider, MD  ramipril (ALTACE) 10 MG tablet Take 10 mg by mouth daily with breakfast.    Yes Historical Provider, MD  traMADol (ULTRAM) 50 MG tablet Take 50 mg by mouth every 6 (six) hours as needed for pain.  10/27/12  Yes Raynelle Fanning Idol, PA-C  valsartan (DIOVAN) 320 MG tablet Take 320 mg by mouth daily with breakfast.    Yes Historical Provider, MD  HYDROcodone-acetaminophen (NORCO/VICODIN) 5-325 MG per tablet Take 1-2 tablets by mouth every 4 (four) hours as needed (moderate to severe pain). 12/16/12   Doylene Bode, NP    Allergies as of 02/08/2013 - Review Complete 02/08/2013  Allergen Reaction Noted  . Actos (pioglitazone)  12/13/2012  . Ibuprofen  11/23/2012    Family History  Problem Relation Age of Onset  . Diabetes Mother   . Hypertension Mother   . Diabetes Sister   . Hypertension Sister   . Hypertension Brother   . Stroke Maternal Aunt   . Diabetes Maternal Grandmother   . Hypertension Maternal Grandmother   . Colon cancer Neg Hx     History   Social History  .  Marital Status: Married    Spouse Name: N/A    Number of Children: N/A  . Years of Education: N/A   Occupational History  . self-employed     childcare provider   Social History Main Topics  . Smoking status: Former Smoker    Quit date: 12/10/1987  . Smokeless tobacco: Not on file     Comment: quit 22 years ago  . Alcohol Use: Yes     Comment: occasionally  . Drug Use: No  . Sexually Active: Not on file   Other Topics Concern  . Not on file   Social History Narrative  . No narrative on file    Review of Systems: See HPI, otherwise negative ROS   Physical Exam: BP 151/69  Pulse 73  Temp(Src) 97.3 F (36.3 C) (Oral)   Resp 26  Ht 5\' 3"  (1.6 m)  Wt 239 lb (108.41 kg)  BMI 42.35 kg/m2  SpO2 98%  LMP 10/07/2012 General:   Alert,  pleasant and cooperative in NAD Head:  Normocephalic and atraumatic. Neck:  Supple; Lungs:  Clear throughout to auscultation.    Heart:  Regular rate and rhythm. Abdomen:  Soft, nontender and nondistended. Normal bowel sounds, without guarding, and without rebound.   Neurologic:  Alert and  oriented x4;  grossly normal neurologically.  Impression/Plan:     SCREENING-OCCASIONAL CONSTIPATION/ADENO CA L OVARIAN MASS-?COLON PRIMARY  Plan:  1. TCS TODAY

## 2013-02-16 ENCOUNTER — Telehealth: Payer: Self-pay | Admitting: Gastroenterology

## 2013-02-16 ENCOUNTER — Ambulatory Visit (HOSPITAL_COMMUNITY)
Admit: 2013-02-16 | Discharge: 2013-02-16 | Disposition: A | Discharge status: Home / Self Care | Payer: BC Managed Care – PPO | Source: Ambulatory Visit | Attending: Gastroenterology | Admitting: Gastroenterology

## 2013-02-16 DIAGNOSIS — R599 Enlarged lymph nodes, unspecified: Secondary | ICD-10-CM | POA: Insufficient documentation

## 2013-02-16 DIAGNOSIS — C7970 Secondary malignant neoplasm of unspecified adrenal gland: Secondary | ICD-10-CM | POA: Insufficient documentation

## 2013-02-16 DIAGNOSIS — C189 Malignant neoplasm of colon, unspecified: Secondary | ICD-10-CM | POA: Insufficient documentation

## 2013-02-16 LAB — POCT I-STAT, CHEM 8
Calcium, Ion: 1.01 mmol/L — ABNORMAL LOW (ref 1.12–1.23)
HCT: 31 % — ABNORMAL LOW (ref 36.0–46.0)
Hemoglobin: 10.5 g/dL — ABNORMAL LOW (ref 12.0–15.0)
TCO2: 29 mmol/L (ref 0–100)

## 2013-02-16 MED ORDER — IOHEXOL 300 MG/ML  SOLN
100.0000 mL | Freq: Once | INTRAMUSCULAR | Status: AC | PRN
  Administered 2013-02-16: 100 mL via INTRAVENOUS

## 2013-02-16 NOTE — Progress Notes (Signed)
Blood sample obtained from left arm IV for Creatnine level.  

## 2013-02-16 NOTE — Telephone Encounter (Signed)
Called patient TO DISCUSS RESULTS: PATH/CT. PATH/CT CONFIRM METASTATIC COLON CA. NO ANSWER/NO VM. HAS APPT WITH SURGERY-DR. JENKINS THUR APR 17 AT 1030 AM. CONTACT PT WITH APPT TIME.  FAX NOTES, OP/ENDO/PATH REPORT, LABS/XRAY REPORT FROM 2014

## 2013-02-17 ENCOUNTER — Encounter (HOSPITAL_COMMUNITY): Payer: BC Managed Care – PPO | Attending: Oncology | Admitting: Oncology

## 2013-02-17 ENCOUNTER — Encounter (HOSPITAL_COMMUNITY): Payer: Self-pay | Admitting: Gastroenterology

## 2013-02-17 VITALS — BP 134/82 | HR 76 | Temp 98.5°F | Resp 20 | Ht 62.5 in | Wt 237.0 lb

## 2013-02-17 DIAGNOSIS — E669 Obesity, unspecified: Secondary | ICD-10-CM | POA: Insufficient documentation

## 2013-02-17 DIAGNOSIS — Z85038 Personal history of other malignant neoplasm of large intestine: Secondary | ICD-10-CM | POA: Insufficient documentation

## 2013-02-17 DIAGNOSIS — Z09 Encounter for follow-up examination after completed treatment for conditions other than malignant neoplasm: Secondary | ICD-10-CM | POA: Insufficient documentation

## 2013-02-17 DIAGNOSIS — E119 Type 2 diabetes mellitus without complications: Secondary | ICD-10-CM | POA: Insufficient documentation

## 2013-02-17 DIAGNOSIS — I10 Essential (primary) hypertension: Secondary | ICD-10-CM

## 2013-02-17 DIAGNOSIS — C7960 Secondary malignant neoplasm of unspecified ovary: Secondary | ICD-10-CM

## 2013-02-17 DIAGNOSIS — C189 Malignant neoplasm of colon, unspecified: Secondary | ICD-10-CM

## 2013-02-17 DIAGNOSIS — E049 Nontoxic goiter, unspecified: Secondary | ICD-10-CM

## 2013-02-17 DIAGNOSIS — C182 Malignant neoplasm of ascending colon: Secondary | ICD-10-CM

## 2013-02-17 LAB — COMPREHENSIVE METABOLIC PANEL
ALT: 7 U/L (ref 0–35)
Chloride: 97 mEq/L (ref 96–112)
Creatinine, Ser: 0.56 mg/dL (ref 0.50–1.10)
GFR calc Af Amer: >90 mL/min (ref >90)
GFR calc non Af Amer: >90 mL/min (ref >90)
Potassium: 3.1 mEq/L — ABNORMAL LOW (ref 3.5–5.1)

## 2013-02-17 LAB — CBC WITH DIFFERENTIAL/PLATELET
Basophils Absolute: 0 10*3/uL (ref 0.0–0.1)
Eosinophils Absolute: 0.1 10*3/uL (ref 0.0–0.7)
Lymphocytes Relative: 37 % (ref 12–46)
Monocytes Relative: 7 % (ref 3–12)
Platelets: 234 10*3/uL (ref 150–400)
RDW: 16.2 % — ABNORMAL HIGH (ref 11.5–15.5)
WBC: 5.6 10*3/uL (ref 4.0–10.5)

## 2013-02-17 NOTE — Progress Notes (Signed)
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 Loree Fee on 12/13/2012. At that time he did TAH and BSO with lysis of adhesions, partial omentectomy, and resection of pelvic peritoneal implant. At the completion of the surgical procedure there is no evidence of residual disease. Her CEA is within the normal range. Her cancer however did not mark as ovarian in origin and a subsequent colonoscopy revealed a right ascending colon cancer. This was discovered by Dr. Raj Janus on 02/14/2013. It was near circumferential. She will need to have palliative resection.  #2 thyromegaly with a large goiter #3 diabetes mellitus on therapy #4 hypertension on therapy though she is no longer using valsartan and we'll continue to hold that drug. #5 obesity weighing over 320 pounds at one time. She states she weight 285 pounds prior to her surgery Dr. Loree Fee and is now down to 237 pounds. #6 poor dental hygiene  Very pleasant 57 year old African American woman accompanied by her husband who started having nonspecific lower abdominal discomfort approximately in the late fall 2012. This took her to her primary care physician's office referred her to Dr. Despina Hidden who discovered a pelvic mass and referred her to Dr. Loree Fee. She had the above-mentioned surgery but did not pathologically. The ovarian in origin and she was sent back for further evaluation.  She has never had a colonoscopy. She is no family history of colon cancer, uterine cancer, breast cancer etc. She states she was afraid to have colonoscopy because of a uncles bad experience with the colon perforation.  She has 4 children one of whom lives at home. That daughter has 5 children living in the home. She and her husband have been married 32 years. She does not smoke. She drinks socially on occasion only.  She had not really lost weight significantly until the  surgery and just before the surgery.  She's never had heart disease heart attack etc. She's never been treated for her huge thyroid gland.  . The rest of her oncology review of systems is negative. It is of note that she's never had mammography either but she did breast-feed all of her children. 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  BP 134/82  Pulse 76  Temp(Src) 98.5 F (36.9 C) (Oral)  Resp 20  Ht 5' 2.5" (1.588 m)  Wt 237 lb (107.502 kg)  BMI 42.63 kg/m2  LMP 10/07/2012  She is a very pleasant woman in no acute distress. She has multiple missing teeth. Otherwise she has normal facial symmetry. She does have a very large mass at the base of neck consistent with a goiter which is symmetrical but slightly nodular. She has no lymphadenopathy in any location. Her lungs are clear to auscultation and percussion. Skin exam is unremarkable. Heart shows a regular rhythm and rate without murmur rub or gallop. Breast exam is negative for obvious masses. Abdomen is obese but without obvious mass or ascites or hepatosplenomegaly. Her midline incision is healing very nicely. She has no leg edema or arm edema. She is right handed. She has 1+ pulses in her feet.  This lady had numerous questions we spent an hour with her and her husband counseling her on the diagnosis and what needs to be done with surgery, Port-A-Cath insertion, chemotherapy etc. She knows that this is palliative. With her CEA being unremarkable we will check it one more time but I doubt that it will  be very helpful. We will have to do surveillance CAT scans once we start chemotherapy and if we give her breaks in chemotherapy.  I think she and her husband understood very clearly with the plan of therapy will include and that our goal is to prolong her life as long as we can with good quality. She is to see Dr. Lovell Sheehan next Thursday for her consultation.

## 2013-02-17 NOTE — Addendum Note (Signed)
Addended by: Evelena Leyden on: 02/17/2013 03:36 PM   Modules accepted: Orders

## 2013-02-17 NOTE — Telephone Encounter (Signed)
Tried to call pt. Many rings and no answer. No VM. Mailed her a letter to call [name] to discuss path and with the appointment date/time with Dr. Lovell Sheehan.

## 2013-02-17 NOTE — Patient Instructions (Addendum)
[facility] Discharge Instructions  RECOMMENDATIONS MADE BY THE CONSULTANT AND ANY TEST RESULTS WILL BE SENT TO YOUR REFERRING PHYSICIAN.  EXAM FINDINGS BY THE PHYSICIAN TODAY AND SIGNS OR SYMPTOMS TO REPORT TO CLINIC OR PRIMARY PHYSICIAN: Exam and discussion by MD.  Monica Allen will need surgery for your colon and a port a cath to be placed for chemotherapy to be administered.  Continue off the diovan.  We will get your scheduled to meet with our Nurse Navigator for teaching about the chemotherapy we plan to use.  We will check some blood work to check your thyroid and to make sure that you are not iron deficient.  MEDICATIONS PRESCRIBED:  none    SPECIAL INSTRUCTIONS/FOLLOW-UP: Return for follow-up in 4 - 5 weeks after surgery.  Thank you for choosing [name] to provide your oncology and hematology care.  To afford each patient quality time with our providers, please arrive at least 15 minutes before your scheduled appointment time.  With your help, our goal is to use those 15 minutes to complete the necessary work-up to ensure our physicians have the information they need to help with your evaluation and healthcare recommendations.    Effective January 1st, 2014, we ask that you re-schedule your appointment with our physicians should you arrive 10 or more minutes late for your appointment.  We strive to give you quality time with our providers, and arriving late affects you and other patients whose appointments are after yours.    Again, thank you for choosing [facility].  Our hope is that these requests will decrease the amount of time that you wait before being seen by our physicians.       _____________________________________________________________  Should you have questions after your visit to [facility], please contact our office at [phone] between the hours of 8:30 a.m. and 5:00 p.m.  Voicemails left after 4:30 p.m.  will not be returned until the following business day.  For prescription refill requests, have your pharmacy contact our office with your prescription refill request.

## 2013-02-17 NOTE — Progress Notes (Signed)
Monica Allen presented for labwork. Labs per MD order drawn via Peripheral Line 23 gauge needle inserted in right AC  Good blood return present. Procedure without incident.  Needle removed intact. Patient tolerated procedure well.

## 2013-02-18 LAB — TSH: TSH: 1.034 u[IU]/mL (ref 0.350–4.500)

## 2013-02-18 LAB — CEA: CEA: 1.2 ng/mL (ref 0.0–5.0)

## 2013-02-18 LAB — IRON AND TIBC: TIBC: 381 ug/dL (ref 250–470)

## 2013-02-20 ENCOUNTER — Other Ambulatory Visit (HOSPITAL_COMMUNITY): Payer: Self-pay

## 2013-02-20 MED ORDER — POTASSIUM CHLORIDE CRYS ER 20 MEQ PO TBCR: 20.0000 meq | EXTENDED_RELEASE_TABLET | Freq: Two times a day (BID) | ORAL | Status: DC

## 2013-02-21 ENCOUNTER — Telehealth: Payer: Self-pay | Admitting: Gastroenterology

## 2013-02-21 NOTE — Telephone Encounter (Signed)
PT SEEN BY DR. Vivia Ewing APPT W/ DR. Lovell Sheehan. Called patient TO DISCUSS RESULTS. NO VOICE MAILBOX. PT AWARE OF CT RESULTS. WILL TRY AGAIN APR 16.

## 2013-02-22 NOTE — Telephone Encounter (Signed)
Called pt. She received the letter and is aware of her appt with Dr. Lovell Sheehan tomorrow. She said she was gone to the doctor when [name] tried to call her to discuss her path. She will be home today, so I told her I will let [name] know.

## 2013-02-22 NOTE — Telephone Encounter (Signed)
Called patient TO DISCUSS RESULTS. She voiced her understanding. iCa 4/10 low but serum Ca 4/11 9.2 with an albumin of 3.2 .  PT WILL CALL WITH QUESTIONS.

## 2013-02-23 NOTE — H&P (Signed)
  NTS SOAP Note  Vital Signs:  Vitals as of: 02/23/2013: Systolic 156: Diastolic 96: Heart Rate 69: Temp 97.48F: Height 63ft 2.5in: Weight 233Lbs 0 Ounces: Pain Level 2: BMI 41.94  BMI : 41.94 kg/m2  Subjective: This 57 Years 1 Months old Female presents for right colon mass.  Found recently on TCS by [name].  Underwent TAH-BSO/omentectomy by 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  The right colon mass is positive for malignancy, and is near obstructing.  Now presents for right hemicolectomy and port placement.   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: carcinomatosis, HTN, NIDDM Allergies: actos, ibuprofen Medications: coreg, lasix, glipizide, jentadueto, altace, diovan, vicodin, klorcon, ultram   Social History:Reviewed  Social History  Preferred Language: English Race:  Black or African American, White Ethnicity: Not Hispanic / Latino Age: 57 Years 1 Months Marital Status:  M Alcohol: rarely Recreational drug(s):  No   Smoking Status: Never smoker reviewed on 02/23/2013 Functional Status reviewed on mm/dd/yyyy ------------------------------------------------ Bathing: Normal Cooking: Normal Dressing: Normal Driving: Normal Eating: Normal Managing Meds: Normal Oral Care: Normal Shopping: Normal Toileting: Normal Transferring: Normal Walking: Normal Cognitive Status reviewed on  mm/dd/yyyy ------------------------------------------------ Attention: Normal Decision Making: Normal Language: Normal Memory: Normal Motor: Normal Perception: Normal Problem Solving: Normal Visual and Spatial: Normal   Family History:  Reviewed   Family History  Is there a family history of:dm, HTN, CAD    Objective Information: General:  Well appearing, well nourished in no distress. Neck:  Supple without lymphadenopathy.  Heart:  RRR, no murmur Lungs:    CTA bilaterally, no wheezes, rhonchi, rales.  Breathing unlabored. Abdomen:Soft, NT/ND, no HSM, no masses.  Well healed midline surgical scar noted. Notes from [name] reviewed. Assessment:Right colon carcinoma, h/o right ovarian carcinoma  Diagnosis &amp; Procedure Smart Code   Plan:Scheduled for partial colectomy, portacath insertion on 03/01/13.   Patient Education:Alternative treatments to surgery were discussed with patient (and family).  Risks and benefits  of procedures including bleeding, infection, pneumothorax, and the possibility of a blood transfusion were fully explained to the patient (and family) who gave informed consent. Patient/family questions were addressed.  Follow-up:Pending Surgery

## 2013-02-27 ENCOUNTER — Encounter (HOSPITAL_COMMUNITY)
Admission: RE | Admit: 2013-02-27 | Discharge: 2013-02-27 | Disposition: A | Discharge status: Home / Self Care | Payer: BC Managed Care – PPO | Source: Ambulatory Visit | Attending: General Surgery | Admitting: General Surgery

## 2013-02-27 ENCOUNTER — Encounter (HOSPITAL_COMMUNITY): Payer: Self-pay

## 2013-02-27 ENCOUNTER — Encounter (HOSPITAL_COMMUNITY): Payer: Self-pay | Admitting: Pharmacy Technician

## 2013-02-27 HISTORY — DX: Anemia, unspecified: D64.9

## 2013-02-27 HISTORY — DX: Malignant (primary) neoplasm, unspecified: C80.1

## 2013-02-27 LAB — COMPREHENSIVE METABOLIC PANEL
Alkaline Phosphatase: 56 U/L (ref 39–117)
BUN: 10 mg/dL (ref 6–23)
Chloride: 96 mEq/L (ref 96–112)
Creatinine, Ser: 0.56 mg/dL (ref 0.50–1.10)
GFR calc Af Amer: >90 mL/min (ref >90)
Glucose, Bld: 340 mg/dL — ABNORMAL HIGH (ref 70–99)
Potassium: 3.9 mEq/L (ref 3.5–5.1)
Total Bilirubin: 0.3 mg/dL (ref 0.3–1.2)
Total Protein: 7.2 g/dL (ref 6.0–8.3)

## 2013-02-27 LAB — CBC WITH DIFFERENTIAL/PLATELET
Basophils Relative: 0 % (ref 0–1)
Eosinophils Relative: 3 % (ref 0–5)
HCT: 31.6 % — ABNORMAL LOW (ref 36.0–46.0)
Hemoglobin: 9.5 g/dL — ABNORMAL LOW (ref 12.0–15.0)
MCHC: 30.1 g/dL (ref 30.0–36.0)
MCV: 65.8 fL — ABNORMAL LOW (ref 78.0–100.0)
Monocytes Absolute: 0.5 10*3/uL (ref 0.1–1.0)
Monocytes Relative: 9 % (ref 3–12)
Neutro Abs: 2.5 10*3/uL (ref 1.7–7.7)

## 2013-02-27 LAB — PREPARE RBC (CROSSMATCH)

## 2013-02-27 NOTE — Patient Instructions (Addendum)
Monica Allen  02/27/2013   Your procedure is scheduled on:  03/01/2013  Report to [name] at  1115  AM.  Call this number if you have problems the morning of surgery: [phone]   Remember:   Do not eat food or drink liquids after midnight.   Take these medicines the morning of surgery with A SIP OF WATER:  Carvedilol,altace,tramdol,hydrocodone   Do not wear jewelry, make-up or nail polish.  Do not wear lotions, powders, or perfumes. You may wear deodorant.  Do not shave 48 hours prior to surgery. Men may shave face and neck.  Do not bring valuables to the hospital.  Contacts, dentures or bridgework may not be worn into surgery.  Leave suitcase in the car. After surgery it may be brought to your room.  For patients admitted to the hospital, checkout time is 11:00 AM the day of discharge.   Patients discharged the day of surgery will not be allowed to drive  home.  Name and phone number of your driver: family  Special Instructions: Shower using CHG 2 nights before surgery and the night before surgery.  If you shower the day of surgery use CHG.  Use special wash - you have one bottle of CHG for all showers.  You should use approximately 1/3 of the bottle for each shower.   Please read over the following fact sheets that you were given: Pain Booklet, Coughing and Deep Breathing, MRSA Information, Surgical Site Infection Prevention, Anesthesia Post-op Instructions and Care and Recovery After Surgery Implanted Port Instructions An implanted port is a central line that has a round shape and is placed under the skin. It is used for long-term IV (intravenous) access for:  Medicine.  Fluids.  Liquid nutrition, such as TPN (total parenteral nutrition).  Blood samples. Ports can be placed:  In the chest area just below the collarbone (this is the most common place.)  In the arms.  In the belly (abdomen) area.  In the legs. PARTS OF THE PORT A port has 2 main  parts:  The reservoir. The reservoir is round, disc-shaped, and will be a small, raised area under your skin.  The reservoir is the part where a needle is inserted (accessed) to either give medicines or to draw blood.  The catheter. The catheter is a long, slender tube that extends from the reservoir. The catheter is placed into a large vein.  Medicine that is inserted into the reservoir goes into the catheter and then into the vein. INSERTION OF THE PORT  The port is surgically placed in either an operating room or in a procedural area (interventional radiology).  Medicine may be given to help you relax during the procedure.  The skin where the port will be inserted is numbed (local anesthetic).  1 or 2 small cuts (incisions) will be made in the skin to insert the port.  The port can be used after it has been inserted. INCISION SITE CARE  The incision site may have small adhesive strips on it. This helps keep the incision site closed. Sometimes, no adhesive strips are placed. Instead of adhesive strips, a special kind of surgical glue is used to keep the incision closed.  If adhesive strips were placed on the incision sites, do not take them off. They will fall off on their own.  The incision site may be sore for 1 to 2 days. Pain medicine can help.  Do not get the  incision site wet. Bathe or shower as directed by your caregiver.  The incision site should heal in 5 to 7 days. A small scar may form after the incision has healed. ACCESSING THE PORT Special steps must be taken to access the port:  Before the port is accessed, a numbing cream can be placed on the skin. This helps numb the skin over the port site.  A sterile technique is used to access the port.  The port is accessed with a needle. Only "non-coring" port needles should be used to access the port. Once the port is accessed, a blood return should be checked. This helps ensure the port is in the vein and is not clogged  (clotted).  If your caregiver believes your port should remain accessed, a clear (transparent) bandage will be placed over the needle site. The bandage and needle will need to be changed every week or as directed by your caregiver.  Keep the bandage covering the needle clean and dry. Do not get it wet. Follow your caregiver's instructions on how to take a shower or bath when the port is accessed.  If your port does not need to stay accessed, no bandage is needed over the port. FLUSHING THE PORT Flushing the port keeps it from getting clogged. How often the port is flushed depends on:  If a constant infusion is running. If a constant infusion is running, the port may not need to be flushed.  If intermittent medicines are given.  If the port is not being used. For intermittent medicines:  The port will need to be flushed:  After medicines have been given.  After blood has been drawn.  As part of routine maintenance.  A port is normally flushed with:  Normal saline.  Heparin.  Follow your caregiver's advice on how often, how much, and the type of flush to use on your port. IMPORTANT PORT INFORMATION  Tell your caregiver if you are allergic to heparin.  After your port is placed, you will get a manufacturer's information card. The card has information about your port. Keep this card with you at all times.  There are many types of ports available. Know what kind of port you have.  In case of an emergency, it may be helpful to wear a medical alert bracelet. This can help alert health care workers that you have a port.  The port can stay in for as long as your caregiver believes it is necessary.  When it is time for the port to come out, surgery will be done to remove it. The surgery will be similar to how the port was put in.  If you are in the hospital or clinic:  Your port will be taken care of and flushed by a nurse.  If you are at home:  A home health care nurse may  give medicines and take care of the port.  You or a family member can get special training and directions for giving medicine and taking care of the port at home. SEEK IMMEDIATE MEDICAL CARE IF:   Your port does not flush or you are unable to get a blood return.  New drainage or pus is coming from the incision.  A bad smell is coming from the incision site.  You develop swelling or increased redness at the incision site.  You develop increased swelling or pain at the port site.  You develop swelling or pain in the surrounding skin near the port.  You  have an oral temperature above 102 F (38.9 C), not controlled by medicine. MAKE SURE YOU:   Understand these instructions.  Will watch your condition.  Will get help right away if you are not doing well or get worse. Document Released: 10/26/2005 Document Revised: 01/18/2012 Document Reviewed: 01/17/2009 [facility] Patient Information 2013 [place], [place]. Removal of the Colon (Colectomy) Care After AFTER THE PROCEDURE After surgery, you will be taken to the recovery area where a nurse will watch you and check your progress. After the recovery area you will go to your hospital room. Your surgeon will determine when it is alright for you to take fluids and foods. You will be given pain medications to keep you comfortable. HOME CARE INSTRUCTIONS  Once home, an ice pack applied to the operative site may help with discomfort and keep swelling down.  Change dressings as directed.  Only take over-the-counter or prescription medicines for pain, discomfort, or fever as directed by your caregiver.  You may continue normal diet and activities as directed.  There should be no heavy lifting (more than 10 pounds), strenuous activities or contact sports for three weeks, or as directed.  Keep the wound dry and clean. The wound may be washed gently with soap and water. Gently blot or dab dry following cleansing, without rubbing. Do not take  baths, use swimming pools, or use hot tubs for ten days, or as instructed by your caregivers.  If you have a colostomy, care for it as you have been shown. SEEK MEDICAL CARE IF:   There is redness, swelling, or increasing pain in the wound area.  Pus is coming from the wound.  An unexplained oral temperature above 102 F (38.9 C) develops.  You notice a foul smell coming from the wound or dressing.  There is a breaking open of a wound (edges not staying together) after the sutures have been removed.  There is increasing abdominal pain. SEEK IMMEDIATE MEDICAL CARE IF:   A rash develops.  There is difficulty breathing, or development of a reaction or side effects to medications given. Document Released: 05/26/2004 Document Revised: 01/18/2012 Document Reviewed: 11/29/2007 [facility] Patient Information 2013 [place], [place]. PATIENT INSTRUCTIONS POST-ANESTHESIA  IMMEDIATELY FOLLOWING SURGERY:  Do not drive or operate machinery for the first twenty four hours after surgery.  Do not make any important decisions for twenty four hours after surgery or while taking narcotic pain medications or sedatives.  If you develop intractable nausea and vomiting or a severe headache please notify your doctor immediately.  FOLLOW-UP:  Please make an appointment with your surgeon as instructed. You do not need to follow up with anesthesia unless specifically instructed to do so.  WOUND CARE INSTRUCTIONS (if applicable):  Keep a dry clean dressing on the anesthesia/puncture wound site if there is drainage.  Once the wound has quit draining you may leave it open to air.  Generally you should leave the bandage intact for twenty four hours unless there is drainage.  If the epidural site drains for more than 36-48 hours please call the anesthesia department.  QUESTIONS?:  Please feel free to call your physician or the hospital operator if you have any questions, and they will be happy to assist you.

## 2013-02-28 LAB — ABO/RH: ABO/RH(D): A POS

## 2013-02-28 NOTE — Pre-Procedure Instructions (Signed)
Dr Lovell Sheehan made aware of H&H. No further orders given.

## 2013-02-28 NOTE — Pre-Procedure Instructions (Signed)
Dr Jayme Cloud aware of hgb of 9.5 and glucose of 340. Wants to start patients iv with blood set and have 1,000 bag of saline ready for possible trasfusion. Wiil obtain accucheck on arrival.

## 2013-03-01 ENCOUNTER — Encounter (HOSPITAL_COMMUNITY)
Admission: RE | Disposition: A | Discharge status: Home / Self Care | Payer: Self-pay | Source: Ambulatory Visit | Attending: General Surgery

## 2013-03-01 ENCOUNTER — Ambulatory Visit (HOSPITAL_COMMUNITY): Payer: BC Managed Care – PPO

## 2013-03-01 ENCOUNTER — Encounter (HOSPITAL_COMMUNITY): Payer: Self-pay | Admitting: Anesthesiology

## 2013-03-01 ENCOUNTER — Inpatient Hospital Stay (HOSPITAL_COMMUNITY)
Admission: RE | Admit: 2013-03-01 | Discharge: 2013-03-04 | DRG: 148 | Disposition: A | Discharge status: Home / Self Care | Payer: BC Managed Care – PPO | Source: Ambulatory Visit | Attending: General Surgery | Admitting: General Surgery

## 2013-03-01 ENCOUNTER — Ambulatory Visit (HOSPITAL_COMMUNITY): Payer: BC Managed Care – PPO | Admitting: Anesthesiology

## 2013-03-01 ENCOUNTER — Encounter (HOSPITAL_COMMUNITY): Payer: Self-pay | Admitting: *Deleted

## 2013-03-01 DIAGNOSIS — I10 Essential (primary) hypertension: Secondary | ICD-10-CM | POA: Diagnosis present

## 2013-03-01 DIAGNOSIS — F411 Generalized anxiety disorder: Secondary | ICD-10-CM | POA: Diagnosis present

## 2013-03-01 DIAGNOSIS — Z90710 Acquired absence of both cervix and uterus: Secondary | ICD-10-CM

## 2013-03-01 DIAGNOSIS — F3289 Other specified depressive episodes: Secondary | ICD-10-CM | POA: Diagnosis present

## 2013-03-01 DIAGNOSIS — C772 Secondary and unspecified malignant neoplasm of intra-abdominal lymph nodes: Secondary | ICD-10-CM | POA: Diagnosis present

## 2013-03-01 DIAGNOSIS — Z9049 Acquired absence of other specified parts of digestive tract: Secondary | ICD-10-CM

## 2013-03-01 DIAGNOSIS — Z79899 Other long term (current) drug therapy: Secondary | ICD-10-CM

## 2013-03-01 DIAGNOSIS — D630 Anemia in neoplastic disease: Secondary | ICD-10-CM | POA: Diagnosis present

## 2013-03-01 DIAGNOSIS — C182 Malignant neoplasm of ascending colon: Principal | ICD-10-CM | POA: Diagnosis present

## 2013-03-01 DIAGNOSIS — F329 Major depressive disorder, single episode, unspecified: Secondary | ICD-10-CM | POA: Diagnosis present

## 2013-03-01 DIAGNOSIS — E119 Type 2 diabetes mellitus without complications: Secondary | ICD-10-CM | POA: Diagnosis present

## 2013-03-01 DIAGNOSIS — D62 Acute posthemorrhagic anemia: Secondary | ICD-10-CM | POA: Diagnosis present

## 2013-03-01 DIAGNOSIS — C569 Malignant neoplasm of unspecified ovary: Secondary | ICD-10-CM | POA: Diagnosis present

## 2013-03-01 DIAGNOSIS — C779 Secondary and unspecified malignant neoplasm of lymph node, unspecified: Secondary | ICD-10-CM | POA: Diagnosis present

## 2013-03-01 DIAGNOSIS — I428 Other cardiomyopathies: Secondary | ICD-10-CM | POA: Diagnosis present

## 2013-03-01 DIAGNOSIS — Z9079 Acquired absence of other genital organ(s): Secondary | ICD-10-CM

## 2013-03-01 HISTORY — PX: PORTACATH PLACEMENT: SHX2246

## 2013-03-01 HISTORY — PX: PARTIAL COLECTOMY: SHX5273

## 2013-03-01 LAB — HEMOGLOBIN AND HEMATOCRIT, BLOOD
HCT: 28.1 % — ABNORMAL LOW (ref 36.0–46.0)
Hemoglobin: 8.5 g/dL — ABNORMAL LOW (ref 12.0–15.0)

## 2013-03-01 LAB — GLUCOSE, CAPILLARY
Glucose-Capillary: 175 mg/dL — ABNORMAL HIGH (ref 70–99)
Glucose-Capillary: 212 mg/dL — ABNORMAL HIGH (ref 70–99)

## 2013-03-01 SURGERY — COLECTOMY, PARTIAL
Anesthesia: General | Site: Chest | Wound class: Clean Contaminated

## 2013-03-01 MED ORDER — CHLORHEXIDINE GLUCONATE 4 % EX LIQD: 1.0000 "application " | Freq: Once | CUTANEOUS | Status: DC

## 2013-03-01 MED ORDER — ACETAMINOPHEN 10 MG/ML IV SOLN
INTRAVENOUS | Status: AC
  Filled 2013-03-01: qty 100

## 2013-03-01 MED ORDER — ALVIMOPAN 12 MG PO CAPS
12.0000 mg | ORAL_CAPSULE | Freq: Two times a day (BID) | ORAL | Status: DC
  Administered 2013-03-02 – 2013-03-04 (×5): 12 mg via ORAL
  Filled 2013-03-01 (×6): qty 1

## 2013-03-01 MED ORDER — DEXTROSE 5 % IV SOLN
2.0000 g | INTRAVENOUS | Status: DC | PRN
  Administered 2013-03-01: 2 g via INTRAVENOUS

## 2013-03-01 MED ORDER — LACTATED RINGERS IV SOLN
INTRAVENOUS | Status: DC
  Administered 2013-03-01 – 2013-03-02 (×3): via INTRAVENOUS

## 2013-03-01 MED ORDER — SODIUM CHLORIDE 0.9 % IV SOLN
INTRAVENOUS | Status: DC | PRN
  Administered 2013-03-01: 10 mL via INTRAMUSCULAR

## 2013-03-01 MED ORDER — ALVIMOPAN 12 MG PO CAPS
12.0000 mg | ORAL_CAPSULE | Freq: Once | ORAL | Status: AC
  Administered 2013-03-01: 12 mg via ORAL

## 2013-03-01 MED ORDER — PROPOFOL 10 MG/ML IV BOLUS
INTRAVENOUS | Status: DC | PRN
  Administered 2013-03-01: 130 mg via INTRAVENOUS

## 2013-03-01 MED ORDER — INSULIN ASPART 100 UNIT/ML ~~LOC~~ SOLN
0.0000 [IU] | Freq: Three times a day (TID) | SUBCUTANEOUS | Status: DC
  Administered 2013-03-02: 4 [IU] via SUBCUTANEOUS
  Administered 2013-03-02 (×2): 7 [IU] via SUBCUTANEOUS
  Administered 2013-03-03 (×2): 15 [IU] via SUBCUTANEOUS
  Administered 2013-03-03: 7 [IU] via SUBCUTANEOUS
  Administered 2013-03-04: 3 [IU] via SUBCUTANEOUS
  Administered 2013-03-04: 4 [IU] via SUBCUTANEOUS

## 2013-03-01 MED ORDER — FENTANYL CITRATE 0.05 MG/ML IJ SOLN
25.0000 ug | INTRAMUSCULAR | Status: DC | PRN
  Administered 2013-03-01 (×4): 50 ug via INTRAVENOUS

## 2013-03-01 MED ORDER — POVIDONE-IODINE 10 % EX OINT
TOPICAL_OINTMENT | CUTANEOUS | Status: AC
  Filled 2013-03-01: qty 2

## 2013-03-01 MED ORDER — HYDROMORPHONE HCL PF 1 MG/ML IJ SOLN
1.0000 mg | INTRAMUSCULAR | Status: DC | PRN
  Administered 2013-03-01 – 2013-03-02 (×4): 1 mg via INTRAVENOUS
  Filled 2013-03-01 (×4): qty 1

## 2013-03-01 MED ORDER — SUFENTANIL CITRATE 50 MCG/ML IV SOLN
INTRAVENOUS | Status: DC | PRN
  Administered 2013-03-01 (×2): 10 ug via INTRAVENOUS
  Administered 2013-03-01: 20 ug via INTRAVENOUS
  Administered 2013-03-01: 10 ug via INTRAVENOUS

## 2013-03-01 MED ORDER — ENOXAPARIN SODIUM 40 MG/0.4ML ~~LOC~~ SOLN
40.0000 mg | SUBCUTANEOUS | Status: DC
  Administered 2013-03-02 – 2013-03-04 (×3): 40 mg via SUBCUTANEOUS
  Filled 2013-03-01 (×3): qty 0.4

## 2013-03-01 MED ORDER — SUCCINYLCHOLINE CHLORIDE 20 MG/ML IJ SOLN
INTRAMUSCULAR | Status: DC | PRN
Start: 2013-03-01 — End: 2013-03-01
  Administered 2013-03-01: 100 mg via INTRAVENOUS

## 2013-03-01 MED ORDER — ACETAMINOPHEN 10 MG/ML IV SOLN
1000.0000 mg | Freq: Four times a day (QID) | INTRAVENOUS | Status: AC
  Administered 2013-03-01 – 2013-03-02 (×4): 1000 mg via INTRAVENOUS
  Filled 2013-03-01 (×3): qty 100

## 2013-03-01 MED ORDER — MIDAZOLAM HCL 2 MG/2ML IJ SOLN
INTRAMUSCULAR | Status: AC
  Filled 2013-03-01: qty 2

## 2013-03-01 MED ORDER — ONDANSETRON HCL 4 MG/2ML IJ SOLN
4.0000 mg | Freq: Once | INTRAMUSCULAR | Status: AC
  Administered 2013-03-01: 4 mg via INTRAVENOUS

## 2013-03-01 MED ORDER — FENTANYL CITRATE 0.05 MG/ML IJ SOLN
INTRAMUSCULAR | Status: AC
  Filled 2013-03-01: qty 2

## 2013-03-01 MED ORDER — ONDANSETRON HCL 4 MG/2ML IJ SOLN
INTRAMUSCULAR | Status: AC
  Filled 2013-03-01: qty 2

## 2013-03-01 MED ORDER — HEPARIN SOD (PORK) LOCK FLUSH 100 UNIT/ML IV SOLN
INTRAVENOUS | Status: AC
  Filled 2013-03-01: qty 5

## 2013-03-01 MED ORDER — ENOXAPARIN SODIUM 40 MG/0.4ML ~~LOC~~ SOLN
SUBCUTANEOUS | Status: AC
  Filled 2013-03-01: qty 0.4

## 2013-03-01 MED ORDER — DEXTROSE 5 % IV SOLN: 2.0000 g | INTRAVENOUS | Status: DC

## 2013-03-01 MED ORDER — ALVIMOPAN 12 MG PO CAPS
ORAL_CAPSULE | ORAL | Status: AC
  Filled 2013-03-01: qty 1

## 2013-03-01 MED ORDER — MIDAZOLAM HCL 2 MG/2ML IJ SOLN
1.0000 mg | INTRAMUSCULAR | Status: DC | PRN
  Administered 2013-03-01 (×2): 2 mg via INTRAVENOUS

## 2013-03-01 MED ORDER — ONDANSETRON HCL 4 MG PO TABS: 4.0000 mg | ORAL_TABLET | Freq: Four times a day (QID) | ORAL | Status: DC | PRN

## 2013-03-01 MED ORDER — SODIUM CHLORIDE 0.9 % IR SOLN
Status: DC | PRN
  Administered 2013-03-01 (×2): 1000 mL

## 2013-03-01 MED ORDER — RAMIPRIL 10 MG PO CAPS
10.0000 mg | ORAL_CAPSULE | Freq: Every day | ORAL | Status: DC
  Administered 2013-03-02 – 2013-03-04 (×2): 10 mg via ORAL
  Filled 2013-03-01 (×2): qty 1

## 2013-03-01 MED ORDER — SUFENTANIL CITRATE 50 MCG/ML IV SOLN
INTRAVENOUS | Status: AC
  Filled 2013-03-01: qty 1

## 2013-03-01 MED ORDER — ROCURONIUM BROMIDE 100 MG/10ML IV SOLN
INTRAVENOUS | Status: DC | PRN
  Administered 2013-03-01: 5 mg via INTRAVENOUS
  Administered 2013-03-01: 25 mg via INTRAVENOUS
  Administered 2013-03-01 (×2): 10 mg via INTRAVENOUS

## 2013-03-01 MED ORDER — NEOSTIGMINE METHYLSULFATE 1 MG/ML IJ SOLN
INTRAMUSCULAR | Status: DC | PRN
  Administered 2013-03-01: 4 mg via INTRAVENOUS

## 2013-03-01 MED ORDER — HEPARIN SOD (PORK) LOCK FLUSH 100 UNIT/ML IV SOLN
INTRAVENOUS | Status: DC | PRN
  Administered 2013-03-01: 500 [IU] via INTRAVENOUS

## 2013-03-01 MED ORDER — ENOXAPARIN SODIUM 40 MG/0.4ML ~~LOC~~ SOLN
40.0000 mg | Freq: Once | SUBCUTANEOUS | Status: AC
  Administered 2013-03-01: 40 mg via SUBCUTANEOUS

## 2013-03-01 MED ORDER — ONDANSETRON HCL 4 MG/2ML IJ SOLN
4.0000 mg | Freq: Once | INTRAMUSCULAR | Status: AC | PRN
  Administered 2013-03-01: 4 mg via INTRAVENOUS

## 2013-03-01 MED ORDER — LIDOCAINE HCL (CARDIAC) 20 MG/ML IV SOLN
INTRAVENOUS | Status: DC | PRN
  Administered 2013-03-01: 50 mg via INTRAVENOUS

## 2013-03-01 MED ORDER — DEXTROSE 5 % IV SOLN
INTRAVENOUS | Status: AC
  Filled 2013-03-01: qty 2

## 2013-03-01 MED ORDER — SODIUM CHLORIDE 0.9 % IV SOLN
INTRAVENOUS | Status: DC
  Administered 2013-03-01 (×3): via INTRAVENOUS

## 2013-03-01 MED ORDER — LACTATED RINGERS IV SOLN: INTRAVENOUS | Status: DC | PRN

## 2013-03-01 MED ORDER — GLYCOPYRROLATE 0.2 MG/ML IJ SOLN
INTRAMUSCULAR | Status: DC | PRN
  Administered 2013-03-01: 0.6 mg via INTRAVENOUS

## 2013-03-01 MED ORDER — GLYCOPYRROLATE 0.2 MG/ML IJ SOLN
INTRAMUSCULAR | Status: AC
  Filled 2013-03-01: qty 3

## 2013-03-01 MED ORDER — CARVEDILOL PHOSPHATE ER 10 MG PO CP24
80.0000 mg | ORAL_CAPSULE | Freq: Every day | ORAL | Status: DC
  Administered 2013-03-02: 80 mg via ORAL
  Filled 2013-03-01 (×5): qty 8

## 2013-03-01 MED ORDER — ONDANSETRON HCL 4 MG/2ML IJ SOLN: 4.0000 mg | Freq: Four times a day (QID) | INTRAMUSCULAR | Status: DC | PRN

## 2013-03-01 SURGICAL SUPPLY — 95 items
ADH SKN CLS APL DERMABOND .7 (GAUZE/BANDAGES/DRESSINGS) ×2
APPLIER CLIP 11 MED OPEN (CLIP)
APPLIER CLIP 13 LRG OPEN (CLIP)
APPLIER CLIP 9.375 SM OPEN (CLIP)
APR CLP LRG 13 20 CLIP (CLIP)
APR CLP MED 11 20 MLT OPN (CLIP)
APR CLP SM 9.3 20 MLT OPN (CLIP)
BAG DECANTER FOR FLEXI CONT (MISCELLANEOUS) ×3 IMPLANT
BAG HAMPER (MISCELLANEOUS) ×3 IMPLANT
BARRIER SKIN 2 3/4 (OSTOMY) IMPLANT
BARRIER SKIN OD2.25 2 3/4 FLNG (OSTOMY) IMPLANT
BRR SKN FLT 2.75X2.25 2 PC (OSTOMY)
CATH HICKMAN DUAL 12.0 (CATHETERS) IMPLANT
CLAMP POUCH DRAINAGE QUIET (OSTOMY) IMPLANT
CLIP APPLIE 11 MED OPEN (CLIP) IMPLANT
CLIP APPLIE 13 LRG OPEN (CLIP) IMPLANT
CLIP APPLIE 9.375 SM OPEN (CLIP) IMPLANT
CLOTH BEACON ORANGE TIMEOUT ST (SAFETY) ×3 IMPLANT
COVER LIGHT HANDLE STERIS (MISCELLANEOUS) ×7 IMPLANT
DECANTER SPIKE VIAL GLASS SM (MISCELLANEOUS) ×4 IMPLANT
DERMABOND ADVANCED (GAUZE/BANDAGES/DRESSINGS) ×1
DERMABOND ADVANCED .7 DNX12 (GAUZE/BANDAGES/DRESSINGS) ×2 IMPLANT
DRAPE C-ARM FOLDED MOBILE STRL (DRAPES) ×3 IMPLANT
DRAPE UTILITY W/TAPE 26X15 (DRAPES) ×6 IMPLANT
DRAPE WARM FLUID 44X44 (DRAPE) ×3 IMPLANT
DURAPREP 26ML APPLICATOR (WOUND CARE) ×4 IMPLANT
ELECT BLADE 6 FLAT ULTRCLN (ELECTRODE) ×3 IMPLANT
ELECT REM PT RETURN 9FT ADLT (ELECTROSURGICAL) ×3
ELECTRODE REM PT RTRN 9FT ADLT (ELECTROSURGICAL) ×2 IMPLANT
FORMALIN 10 PREFIL 480ML (MISCELLANEOUS) IMPLANT
GLOVE BIO SURGEON STRL SZ7.5 (GLOVE) ×6 IMPLANT
GLOVE BIOGEL PI IND STRL 7.0 (GLOVE) IMPLANT
GLOVE BIOGEL PI INDICATOR 7.0 (GLOVE) ×7
GLOVE ECLIPSE 6.5 STRL STRAW (GLOVE) ×2 IMPLANT
GLOVE SS BIOGEL STRL SZ 6.5 (GLOVE) IMPLANT
GLOVE SUPERSENSE BIOGEL SZ 6.5 (GLOVE) ×5
GOWN STRL REIN XL XLG (GOWN DISPOSABLE) ×21 IMPLANT
INST SET MAJOR GENERAL (KITS) ×3 IMPLANT
IV NS 500ML (IV SOLUTION) ×3
IV NS 500ML BAXH (IV SOLUTION) ×2 IMPLANT
KIT BLADEGUARD II DBL (SET/KITS/TRAYS/PACK) ×3 IMPLANT
KIT PORT POWER 8FR ISP MRI (CATHETERS) ×3 IMPLANT
KIT ROOM TURNOVER APOR (KITS) ×3 IMPLANT
LIGASURE IMPACT 36 18CM CVD LR (INSTRUMENTS) ×3 IMPLANT
MANIFOLD NEPTUNE II (INSTRUMENTS) ×3 IMPLANT
NDL HYPO 25X1 1.5 SAFETY (NEEDLE) ×2 IMPLANT
NEEDLE HYPO 25X1 1.5 SAFETY (NEEDLE) ×6 IMPLANT
NS IRRIG 1000ML POUR BTL (IV SOLUTION) ×6 IMPLANT
PACK ABDOMINAL MAJOR (CUSTOM PROCEDURE TRAY) ×3 IMPLANT
PACK MINOR (CUSTOM PROCEDURE TRAY) ×4 IMPLANT
PAD ARMBOARD 7.5X6 YLW CONV (MISCELLANEOUS) ×3 IMPLANT
PENCIL HANDSWITCHING (ELECTRODE) ×1 IMPLANT
POUCH OSTOMY 2 3/4  H 3804 (WOUND CARE)
POUCH OSTOMY 2 3/4 H 3804 (WOUND CARE)
POUCH OSTOMY 2 PC DRNBL 2.25 (WOUND CARE) IMPLANT
POUCH OSTOMY 2 PC DRNBL 2.75 (WOUND CARE) IMPLANT
POUCH OSTOMY DRNBL 2 1/4 (WOUND CARE)
RELOAD LINEAR CUT PROX 55 BLUE (ENDOMECHANICALS) IMPLANT
RELOAD PROXIMATE 75MM BLUE (ENDOMECHANICALS) ×6 IMPLANT
RELOAD STAPLE 55 3.8 BLU REG (ENDOMECHANICALS) IMPLANT
RELOAD STAPLE 75 3.8 BLU REG (ENDOMECHANICALS) IMPLANT
RETRACTOR WND ALEXIS 25 LRG (MISCELLANEOUS) IMPLANT
RTRCTR WOUND ALEXIS 25CM LRG (MISCELLANEOUS) ×3
SET BASIN LINEN APH (SET/KITS/TRAYS/PACK) ×3 IMPLANT
SET INTRODUCER 12FR PACEMAKER (SHEATH) IMPLANT
SHEATH COOK PEEL AWAY SET 8F (SHEATH) IMPLANT
SPONGE GAUZE 4X4 12PLY (GAUZE/BANDAGES/DRESSINGS) ×3 IMPLANT
SPONGE LAP 18X18 X RAY DECT (DISPOSABLE) ×3 IMPLANT
STAPLER GUN LINEAR PROX 60 (STAPLE) ×1 IMPLANT
STAPLER PROXIMATE 55 BLUE (STAPLE) IMPLANT
STAPLER PROXIMATE 75MM BLUE (STAPLE) ×1 IMPLANT
STAPLER VISISTAT (STAPLE) ×1 IMPLANT
SUCTION POOLE TIP (SUCTIONS) ×3 IMPLANT
SUT CHROMIC 0 SH (SUTURE) ×3 IMPLANT
SUT CHROMIC 2 0 SH (SUTURE) ×4 IMPLANT
SUT CHROMIC 3 0 SH 27 (SUTURE) ×3 IMPLANT
SUT NOVA NAB GS-26 0 60 (SUTURE) IMPLANT
SUT PDS AB 0 CTX 60 (SUTURE) ×2 IMPLANT
SUT PROLENE 3 0 PS 2 (SUTURE) IMPLANT
SUT SILK 2 0 (SUTURE)
SUT SILK 2 0 REEL (SUTURE) IMPLANT
SUT SILK 2 0 SH (SUTURE) ×1 IMPLANT
SUT SILK 2-0 18XBRD TIE 12 (SUTURE) IMPLANT
SUT SILK 3 0 SH CR/8 (SUTURE) ×5 IMPLANT
SUT VIC AB 3-0 SH 27 (SUTURE) ×3
SUT VIC AB 3-0 SH 27X BRD (SUTURE) ×2 IMPLANT
SUT VIC AB 4-0 PS2 27 (SUTURE) ×3 IMPLANT
SYR 20CC LL (SYRINGE) ×2 IMPLANT
SYR CONTROL 10ML LL (SYRINGE) ×5 IMPLANT
SYRINGE 10CC LL (SYRINGE) ×4 IMPLANT
TAPE CLOTH SURG 4X10 WHT LF (GAUZE/BANDAGES/DRESSINGS) ×1 IMPLANT
TOWEL BLUE STERILE X RAY DET (MISCELLANEOUS) ×3 IMPLANT
TOWEL OR 17X26 4PK STRL BLUE (TOWEL DISPOSABLE) ×5 IMPLANT
TRAY FOLEY CATH 14FR (SET/KITS/TRAYS/PACK) ×3 IMPLANT
YANKAUER SUCT BULB TIP 10FT TU (MISCELLANEOUS) ×2 IMPLANT

## 2013-03-01 NOTE — Interval H&P Note (Signed)
History and Physical Interval Note:  03/01/2013 11:24 AM  Monica Allen  has presented today for surgery, with the diagnosis of metastatic colon cancer  The various methods of treatment have been discussed with the patient and family. After consideration of risks, benefits and other options for treatment, the patient has consented to  Procedure(s): PARTIAL COLECTOMY (N/A) INSERTION PORT-A-CATH (N/A) as a surgical intervention .  The patient's history has been reviewed, patient examined, no change in status, stable for surgery.  I have reviewed the patient's chart and labs.  Questions were answered to the patient's satisfaction.     Franky Macho A

## 2013-03-01 NOTE — Transfer of Care (Signed)
Immediate Anesthesia Transfer of Care Note  Patient: Monica Allen  Procedure(s) Performed: Procedure(s): PARTIAL COLECTOMY (N/A) INSERTION PORT-A-CATH (N/A)  Patient Location: PACU  Anesthesia Type:General  Level of Consciousness: awake, alert , oriented and patient cooperative  Airway & Oxygen Therapy: Patient Spontanous Breathing and Patient connected to face mask oxygen  Post-op Assessment: Report given to PACU RN and Post -op Vital signs reviewed and stable  Post vital signs: Reviewed and stable  Complications: No apparent anesthesia complications

## 2013-03-01 NOTE — Anesthesia Preprocedure Evaluation (Signed)
Anesthesia Evaluation  Patient identified by MRN, date of birth, ID band Patient awake    Reviewed: Allergy & Precautions, H&P , NPO status , Patient's Chart, lab work & pertinent test results  Airway Mallampati: II TM Distance: >3 FB Neck ROM: Full    Dental no notable dental hx. (+) Teeth Intact   Pulmonary neg pulmonary ROS, former smoker,  breath sounds clear to auscultation  Pulmonary exam normal       Cardiovascular hypertension, Pt. on medications +CHF ( Nonischemic cardiomyopathy) negative cardio ROS  Rhythm:Regular Rate:Normal     Neuro/Psych PSYCHIATRIC DISORDERS Anxiety Depression negative neurological ROS  negative psych ROS   GI/Hepatic negative GI ROS, Neg liver ROS,   Endo/Other  negative endocrine ROSdiabetes, Type 2, Oral Hypoglycemic Agents  Renal/GU negative Renal ROS  negative genitourinary   Musculoskeletal negative musculoskeletal ROS (+)   Abdominal   Peds negative pediatric ROS (+)  Hematology negative hematology ROS (+)   Anesthesia Other Findings   Reproductive/Obstetrics negative OB ROS                           Anesthesia Physical Anesthesia Plan  ASA: III  Anesthesia Plan: General   Post-op Pain Management:    Induction: Intravenous  Airway Management Planned: Oral ETT  Additional Equipment:   Intra-op Plan:   Post-operative Plan: Extubation in OR  Informed Consent: I have reviewed the patients History and Physical, chart, labs and discussed the procedure including the risks, benefits and alternatives for the proposed anesthesia with the patient or authorized representative who has indicated his/her understanding and acceptance.     Plan Discussed with:   Anesthesia Plan Comments:         Anesthesia Quick Evaluation

## 2013-03-01 NOTE — Op Note (Signed)
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  Anes:  General endotracheal  Indications:  Patient is a 57 year old black female who recently underwent exploratory laparotomy with hysterectomy and bilateral salpingo-oophorectomy by [name] for presumed ovarian carcinoma who was also found to have an ascending colon carcinoma. The patient now presents for a right hemicolectomy as well as power port insertion. The risks and benefits of both procedures including bleeding, infection, pneumothorax, the possibly of a blood transfusion were fully explained to the patient, who gave informed consent.  Procedure note:  The patient is placed the supine position. After induction of general endotracheal anesthesia, the abdomen was prepped and draped using usual sterile technique with DuraPrep. Surgical site confirmation was performed.  A right paramedian incision was made down to the fascia. The peritoneal cavity was entered into without difficulty. The right colon was mobilized along its peritoneal reflection. Tumor was noted just distal to the cecum. There was significant metastatic disease to lymph nodes along the right colic artery and vein. The hepatic flexure was taken down using the LigaSure. A GIA stapler was placed across the terminal ileum as well as across the proximal transverse colon and fired. The mesentery was then divided using the LigaSure. The specimen was sent to pathology further examination. Any major blood vessels in the mesentery were ligated using 2-0 silk suture ligatures. A side to side ileocolic anastomosis was then performed using a GIA 70 stapler. The enterotomy was closed using a TA 60 stapler. The stapler was posterior using 3-0 silk sutures. The mesenteric  defect was closed using a 2-0 chromic gut running suture. The bowel was then returned into the abdominal cavity an orderly fashion. All operating personnel been changed third down and gloves and a new setup was used. The abdomen was copiously irrigated normal saline. No abnormal bleeding was noted at the end of the procedure. The fascia was reapproximated using looped 0 PDS running suture. Subcutaneous layer was irrigated normal saline and the skin was closed using staples. Betadine ointment and dressed a dressings were applied.  Next, a Port-A-Cath was then inserted into the left subclavian vein. The left upper chest was prepped and draped using usual sterile technique with DuraPrep. An incision was made below left clavicle. A needle is advanced into the left subclavian vein using the Seldinger technique without difficulty. A guidewire was then advanced under digital fluoroscopy into the right atrium. An introducer and peel-away sheath were placed over the guidewire. The guidewire was removed and the catheter was then inserted through the peel-away sheath. The peel-away sheath was removed and the catheter was attached to the port. The power port was then placed in subcutaneous pocket. Adequate positioning was confirmed by fluoroscopy. Good back blood flow was noted in the port. The port was flushed with heparin flush. The subcutaneous layer was reapproximated using 3-0 Vicryl interrupted suture. The skin was closed using a 4 Vicryl subcuticular suture. Dermabond was then applied.  All tape and needle counts were correct at the end of the procedures. The patient was extubated in the operating room and transferred to PACU in stable condition.  Complications:  None  EBL:  350 cc  Specimen:  ascending colon

## 2013-03-01 NOTE — Anesthesia Procedure Notes (Signed)
Procedure Name: Intubation Date/Time: 03/01/2013 1:44 PM Performed by: Carolyne Littles, AMY L Pre-anesthesia Checklist: Patient identified, Patient being monitored, Timeout performed, Emergency Drugs available and Suction available Patient Re-evaluated:Patient Re-evaluated prior to inductionOxygen Delivery Method: Circle System Utilized Preoxygenation: Pre-oxygenation with 100% oxygen Intubation Type: IV induction, Rapid sequence and Cricoid Pressure applied Laryngoscope Size: 3 and Miller Grade View: Grade I Tube type: Oral Tube size: 7.0 mm Number of attempts: 1 Airway Equipment and Method: stylet Placement Confirmation: ETT inserted through vocal cords under direct vision,  positive ETCO2 and breath sounds checked- equal and bilateral Secured at: 21 cm Tube secured with: Tape Dental Injury: Teeth and Oropharynx as per pre-operative assessment

## 2013-03-01 NOTE — Progress Notes (Signed)
CXR good

## 2013-03-01 NOTE — Progress Notes (Signed)
CXR obtained.

## 2013-03-01 NOTE — Preoperative (Signed)
Beta Blockers   Reason not to administer Beta Blockers:Not Applicable 

## 2013-03-01 NOTE — Anesthesia Postprocedure Evaluation (Addendum)
  Anesthesia Post-op Note  Patient: Monica Allen  Procedure(s) Performed: Procedure(s): PARTIAL COLECTOMY (N/A) INSERTION PORT-A-CATH (N/A)  Patient Location: PACU  Anesthesia Type:General  Level of Consciousness: awake, alert , oriented and patient cooperative  Airway and Oxygen Therapy: Patient Spontanous Breathing and Patient connected to face mask oxygen  Post-op Pain: moderate  Post-op Assessment: Post-op Vital signs reviewed, Patient's Cardiovascular Status Stable, Respiratory Function Stable, Patent Airway, No signs of Nausea or vomiting and Pain level controlled  Post-op Vital Signs: Reviewed and stable  Complications: No apparent anesthesia complications 03/02/13  Patient doing well, no apparent anesthesia complications.

## 2013-03-02 LAB — MAGNESIUM: Magnesium: 1.5 mg/dL (ref 1.5–2.5)

## 2013-03-02 LAB — PHOSPHORUS: Phosphorus: 3.4 mg/dL (ref 2.3–4.6)

## 2013-03-02 LAB — GLUCOSE, CAPILLARY
Glucose-Capillary: 207 mg/dL — ABNORMAL HIGH (ref 70–99)
Glucose-Capillary: 186 mg/dL — ABNORMAL HIGH (ref 70–99)
Glucose-Capillary: 221 mg/dL — ABNORMAL HIGH (ref 70–99)

## 2013-03-02 LAB — CBC
MCHC: 30.3 g/dL (ref 30.0–36.0)
Platelets: 234 10*3/uL (ref 150–400)
RDW: 16.1 % — ABNORMAL HIGH (ref 11.5–15.5)
WBC: 8.6 10*3/uL (ref 4.0–10.5)

## 2013-03-02 LAB — BASIC METABOLIC PANEL
Calcium: 8.4 mg/dL (ref 8.4–10.5)
GFR calc Af Amer: >90 mL/min (ref >90)
GFR calc non Af Amer: >90 mL/min (ref >90)
Glucose, Bld: 229 mg/dL — ABNORMAL HIGH (ref 70–99)
Potassium: 3.6 mEq/L (ref 3.5–5.1)
Sodium: 137 mEq/L (ref 135–145)

## 2013-03-02 MED ORDER — OXYCODONE-ACETAMINOPHEN 5-325 MG PO TABS
1.0000 | ORAL_TABLET | ORAL | Status: DC | PRN
  Administered 2013-03-04 (×3): 1 via ORAL
  Filled 2013-03-02 (×3): qty 1

## 2013-03-02 MED ORDER — BIOTENE DRY MOUTH MT LIQD
15.0000 mL | Freq: Two times a day (BID) | OROMUCOSAL | Status: DC
  Administered 2013-03-02 – 2013-03-04 (×5): 15 mL via OROMUCOSAL

## 2013-03-02 MED ORDER — HYDROMORPHONE HCL PF 1 MG/ML IJ SOLN
2.0000 mg | INTRAMUSCULAR | Status: DC | PRN
  Administered 2013-03-02 – 2013-03-03 (×8): 2 mg via INTRAVENOUS
  Filled 2013-03-02: qty 1
  Filled 2013-03-02: qty 2
  Filled 2013-03-02: qty 1
  Filled 2013-03-02 (×7): qty 2

## 2013-03-02 MED ORDER — KCL IN DEXTROSE-NACL 20-5-0.45 MEQ/L-%-% IV SOLN
INTRAVENOUS | Status: DC
  Administered 2013-03-02 – 2013-03-03 (×2): via INTRAVENOUS

## 2013-03-02 MED ORDER — SIMETHICONE 80 MG PO CHEW: 80.0000 mg | CHEWABLE_TABLET | Freq: Four times a day (QID) | ORAL | Status: DC | PRN

## 2013-03-02 NOTE — Progress Notes (Signed)
1 Day Post-Op  Subjective: Moderate incisional pain, well controlled with dilaudid this morning.  Objective: Vital signs in last 24 hours: Temp:  [97.6 F (36.4 C)-98.6 F (37 C)] 98.3 F (36.8 C) (04/24 0500) Pulse Rate:  [49-74] 74 (04/24 0500) Resp:  [14-28] 20 (04/24 0500) BP: (119-177)/(41-88) 119/76 mmHg (04/24 0500) SpO2:  [90 %-100 %] 92 % (04/24 0500) Weight:  [105.688 kg (233 lb)] 105.688 kg (233 lb) (04/23 1136) Last BM Date: 02/28/13  Intake/Output from previous day: 04/23 0701 - 04/24 0700 In: 4536 [P.O.:240; I.V.:4096; IV Piggyback:200] Out: 900 [Urine:550; Blood:350] Intake/Output this shift:    General appearance: alert, cooperative and no distress Resp: clear to auscultation bilaterally Cardio: regular rate and rhythm, S1, S2 normal, no murmur, click, rub or gallop GI: Soft. Minimal bowel sounds appreciated. Dressing dry and intact.  Lab Results:   Recent Labs  02/27/13 1400 03/01/13 1749 03/02/13 0450  WBC 5.3  --  8.6  HGB 9.5* 8.5* 8.3*  HCT 31.6* 28.1* 27.4*  PLT 236  --  234   BMET  Recent Labs  02/27/13 1400 03/02/13 0450  NA 134* 137  K 3.9 3.6  CL 96 101  CO2 29 26  GLUCOSE 340* 229*  BUN 10 5*  CREATININE 0.56 0.47*  CALCIUM 9.4 8.4   PT/INR No results found for this basename: LABPROT, INR,  in the last 72 hours  Studies/Results: Dg Chest Port 1 View  03/01/2013  *RADIOLOGY REPORT*  Clinical Data: Post portacath insertion  PORTABLE CHEST - 1 VIEW  Comparison: Portable exam at 1603 hrs compared to 12/09/2012  Findings: New left subclavian power port with tip projecting over SVC. Enlargement of cardiac silhouette. Stable mediastinal contours. New patchy right lung infiltrate particularly upper lobe. No gross pleural effusion or pneumothorax. Mild chronic elevation of right diaphragm. Bones unremarkable.  IMPRESSION: No pneumothorax following Port-A-Cath insertion. Enlargement of cardiac silhouette. New patchy right lung infiltrate.    Original Report Authenticated By: Ulyses Southward, M.D.    Dg C-arm 1-60 Min-no Report  03/01/2013  CLINICAL DATA: metastatic colon cancer   C-ARM 1-60 MINUTES  Fluoroscopy was utilized by the requesting physician.  No radiographic  interpretation.      Anti-infectives: Anti-infectives   Start     Dose/Rate Route Frequency Ordered Stop   03/01/13 1133  cefOXitin (MEFOXIN) 2 g in dextrose 5 % 50 mL IVPB  Status:  Discontinued     2 g 100 mL/hr over 30 Minutes Intravenous On call to O.R. 03/01/13 1133 03/01/13 1706      Assessment/Plan: s/p Procedure(s): PARTIAL COLECTOMY INSERTION PORT-A-CATH Impression: Stable on postoperative day one. Anemia is stable and secondary to acute surgical blood loss and chronic anemia from her known metastatic colon cancer. Plan: We'll get patient ambulating. Will adjust IV fluids. We'll remove Foley catheter. Advance diet as tolerated.  LOS: 1 day    Glynn Yepes A 03/02/2013

## 2013-03-02 NOTE — Care Management Note (Unsigned)
    Page 1 of 1   03/02/2013     12:18:12 PM   CARE MANAGEMENT NOTE 03/02/2013  Patient:  Monica Allen, Monica Allen   Account Number:  [id number]  Date Initiated:  03/02/2013  Documentation initiated by:  Sharrie Rothman  Subjective/Objective Assessment:   Pt admitted from home s/p partial colectomy. Pt lives with her husband and children and will return home at discharge. Pt is independent with ADL's.     Action/Plan:   No CM needs noted at this time.   Anticipated DC Date:  03/06/2013   Anticipated DC Plan:  HOME/SELF CARE      DC Planning Services  CM consult      Choice offered to / List presented to:             Status of service:  Completed, signed off Medicare Important Message given?   (If response is "NO", the following Medicare IM given date fields will be blank) Date Medicare IM given:   Date Additional Medicare IM given:    Discharge Disposition:    Per UR Regulation:    If discussed at Long Length of Stay Meetings, dates discussed:    Comments:  03/02/13 1215 Arlyss Queen, RN BSN CM

## 2013-03-03 ENCOUNTER — Encounter (HOSPITAL_COMMUNITY): Payer: Self-pay | Admitting: General Surgery

## 2013-03-03 LAB — GLUCOSE, CAPILLARY
Glucose-Capillary: 326 mg/dL — ABNORMAL HIGH (ref 70–99)
Glucose-Capillary: 234 mg/dL — ABNORMAL HIGH (ref 70–99)

## 2013-03-03 LAB — CBC
MCH: 20.2 pg — ABNORMAL LOW (ref 26.0–34.0)
MCHC: 30.6 g/dL (ref 30.0–36.0)
Platelets: 249 10*3/uL (ref 150–400)

## 2013-03-03 LAB — BASIC METABOLIC PANEL
BUN: 5 mg/dL — ABNORMAL LOW (ref 6–23)
Calcium: 8.6 mg/dL (ref 8.4–10.5)
GFR calc non Af Amer: >90 mL/min (ref >90)
Glucose, Bld: 297 mg/dL — ABNORMAL HIGH (ref 70–99)

## 2013-03-03 LAB — MAGNESIUM: Magnesium: 1.6 mg/dL (ref 1.5–2.5)

## 2013-03-03 MED ORDER — GLIPIZIDE ER 10 MG PO TB24
10.0000 mg | ORAL_TABLET | Freq: Every day | ORAL | Status: DC
  Administered 2013-03-03 – 2013-03-04 (×2): 10 mg via ORAL
  Filled 2013-03-03 (×2): qty 1

## 2013-03-03 NOTE — Progress Notes (Addendum)
Notified Dr. Lovell Sheehan this morning at 0845 that I would not be giving the patient her heart related pills due to her BP being low this am.  Also I notified him of the patient having a medium size bowel movement that was black.  MD verbalized understandings.

## 2013-03-03 NOTE — Progress Notes (Signed)
2 Days Post-Op  Subjective: Had a bowel movement this a.m. Tolerating full liquid diet well.  Objective: Vital signs in last 24 hours: Temp:  [97.4 F (36.3 C)-98.6 F (37 C)] 98.6 F (37 C) (04/25 1017) Pulse Rate:  [66-103] 89 (04/25 1017) Resp:  [20] 20 (04/25 1017) BP: (97-115)/(66-74) 115/74 mmHg (04/25 1017) SpO2:  [94 %-98 %] 94 % (04/25 1017) Last BM Date: 02/28/13  Intake/Output from previous day: 04/24 0701 - 04/25 0700 In: 340 [P.O.:340] Out: 300 [Urine:300] Intake/Output this shift:    General appearance: alert, cooperative and no distress Resp: clear to auscultation bilaterally Cardio: regular rate and rhythm, S1, S2 normal, no murmur, click, rub or gallop GI: Soft. Dressing dry and intact. Active bowel sounds appreciated.  Lab Results:   Recent Labs  03/02/13 0450 03/03/13 0446  WBC 8.6 10.0  HGB 8.3* 7.8*  HCT 27.4* 25.5*  PLT 234 249   BMET  Recent Labs  03/02/13 0450 03/03/13 0446  NA 137 132*  K 3.6 3.9  CL 101 99  CO2 26 24  GLUCOSE 229* 297*  BUN 5* 5*  CREATININE 0.47* 0.51  CALCIUM 8.4 8.6   PT/INR No results found for this basename: LABPROT, INR,  in the last 72 hours  Studies/Results: Dg Chest Port 1 View  03/01/2013  *RADIOLOGY REPORT*  Clinical Data: Post portacath insertion  PORTABLE CHEST - 1 VIEW  Comparison: Portable exam at 1603 hrs compared to 12/09/2012  Findings: New left subclavian power port with tip projecting over SVC. Enlargement of cardiac silhouette. Stable mediastinal contours. New patchy right lung infiltrate particularly upper lobe. No gross pleural effusion or pneumothorax. Mild chronic elevation of right diaphragm. Bones unremarkable.  IMPRESSION: No pneumothorax following Port-A-Cath insertion. Enlargement of cardiac silhouette. New patchy right lung infiltrate.   Original Report Authenticated By: Ulyses Southward, M.D.    Dg C-arm 1-60 Min-no Report  03/01/2013  CLINICAL DATA: metastatic colon cancer   C-ARM 1-60  MINUTES  Fluoroscopy was utilized by the requesting physician.  No radiographic  interpretation.      Anti-infectives: Anti-infectives   Start     Dose/Rate Route Frequency Ordered Stop   03/01/13 1133  cefOXitin (MEFOXIN) 2 g in dextrose 5 % 50 mL IVPB  Status:  Discontinued     2 g 100 mL/hr over 30 Minutes Intravenous On call to O.R. 03/01/13 1133 03/01/13 1706      Assessment/Plan: s/p Procedure(s): PARTIAL COLECTOMY INSERTION PORT-A-CATH Impression: Stable on postoperative day 2. Bowel function is returning. We'll advance to carb modified diet. Hemoglobin remaining stable. No need for blood transfusion. Anticipate discharge in next 24-48 hours.  LOS: 2 days    Avalee Castrellon A 03/03/2013

## 2013-03-03 NOTE — Progress Notes (Signed)
Inpatient Diabetes Program Recommendations  AACE/ADA: New Consensus Statement on Inpatient Glycemic Control (2013)  Target Ranges:  Prepandial:   less than 140 mg/dL      Peak postprandial:   less than 180 mg/dL (1-2 hours)      Critically ill patients:  140 - 180 mg/dL   Results for SADYE, KIERNAN (MRN [id number]) as of 03/03/2013 08:37  Ref. Range 03/01/2013 11:21 03/01/2013 16:04 03/01/2013 22:09 03/02/2013 07:54 03/02/2013 12:07 03/02/2013 16:38  Glucose-Capillary Latest Range: 70-99 mg/dL 409 (H) 811 (H) 914 (H) 207 (H) 186 (H) 221 (H)   Results for WYLIE, RUSSON (MRN [id number]) as of 03/03/2013 08:37  Ref. Range 03/02/2013 04:50 03/03/2013 04:46  Glucose Latest Range: 70-99 mg/dL 086 (H) 578 (H)   Inpatient Diabetes Program Recommendations Insulin - Basal: Please consider ordering low dose basal insulin.  Recommend Levemir 10 units daily. HgbA1C: Please consider ordering an A1C to determine glycemic control over the past 2-3 months.  Note: Patient has a history of diabetes and takes Glipizide 10 mg daily and Jentadueto 2.03-999 mg daily at home for diabetes management.  Currently, patient is ordered to receive Novolog 0-20 AC for inpatient glycemic control.  Blood glucose over the past 24 hours has ranged from 160-297 mg/dl.  Fasting blood glucose this morning is 297 mg/dl.  Please consider starting low dose basal insulin while inpatient; recommend Levemir 10 units daily.  Also, please consider ordering an A1C to determine glycemic control over the past 2-3 months.  Last A1C in the chart was 8.0% on 04/02/2009.  Will continue to follow.  Thanks, Orlando Penner, RN, BSN, CCRN Diabetes Coordinator Inpatient Diabetes Program [phone]

## 2013-03-04 LAB — BASIC METABOLIC PANEL
BUN: 8 mg/dL (ref 6–23)
Chloride: 99 mEq/L (ref 96–112)
GFR calc Af Amer: >90 mL/min (ref >90)
Potassium: 3.5 mEq/L (ref 3.5–5.1)
Sodium: 134 mEq/L — ABNORMAL LOW (ref 135–145)

## 2013-03-04 LAB — CBC
HCT: 24.2 % — ABNORMAL LOW (ref 36.0–46.0)
Hemoglobin: 7.4 g/dL — ABNORMAL LOW (ref 12.0–15.0)
RDW: 16.4 % — ABNORMAL HIGH (ref 11.5–15.5)
WBC: 9.7 10*3/uL (ref 4.0–10.5)

## 2013-03-04 LAB — GLUCOSE, CAPILLARY: Glucose-Capillary: 140 mg/dL — ABNORMAL HIGH (ref 70–99)

## 2013-03-04 MED ORDER — HYDROCODONE-ACETAMINOPHEN 5-325 MG PO TABS: 1.0000 | ORAL_TABLET | ORAL | Status: DC | PRN

## 2013-03-04 NOTE — Progress Notes (Signed)
IV removed, site WNL.  Pt given d/c instructions and new prescriptions.  Discussed home care with patient and discussed home medications, patient verbalizes understanding, teachback completed. F/U appointment to be made by pt on Monday when office opens pt states they will keep appointment. Pt is stable at this time. Pt taken to main entrance in wheelchair by staff member.

## 2013-03-04 NOTE — Discharge Summary (Signed)
Physician Discharge Summary  Patient ID: BETTE BRIENZA MRN: [id number] DOB/AGE: 55-May-1957 55 y.o.  Admit date: 03/01/2013 Discharge date: 03/04/2013  Admission Diagnoses:Colon mass   Discharge Diagnoses: The same Active Problems:   * No active hospital problems. *   Discharged Condition: stable  Hospital Course: Patient presented with colon mass.  Underwent planned partial colectomy.  Post-operative course unremarkable.  Increased diet as bowel function returned.  Tolerating diet.  Pain controlled.  Ambulating.  Consults: None  Significant Diagnostic Studies: labs: daily  Treatments: surgery: partial colectomy  Discharge Exam: Blood pressure 110/72, pulse 88, temperature 97.5 F (36.4 C), temperature source Oral, resp. rate 18, height 5' 2.5" (1.588 m), weight 105.688 kg (233 lb), last menstrual period 10/07/2012, SpO2 99.00%. General appearance: alert and no distress Resp: clear to auscultation bilaterally Cardio: regular rate and rhythm GI: soft, non-tender; bowel sounds normal; no masses,  no organomegaly and expected tenderness. Incision c/d/i.  Disposition: 01-Home or Self Care  Discharge Orders   Future Appointments Provider Department Dept Phone   03/14/2013 9:30 AM Claudia Desanctis [facility] [phone]   Future Orders Complete By Expires     Call MD for:  difficulty breathing, headache or visual disturbances  As directed     Call MD for:  persistant nausea and vomiting  As directed     Call MD for:  redness, tenderness, or signs of infection (pain, swelling, redness, odor or green/yellow discharge around incision site)  As directed     Call MD for:  severe uncontrolled pain  As directed     Call MD for:  temperature >100.4  As directed     Diet - low sodium heart healthy  As directed     Discharge instructions  As directed     Comments:      Increase activity as tolerated. May place ice pack for comfort.  Alternate an anti-inflammatory such  as ibuprofen (Motrin, Advil) 400-600mg  every 6 hours with the prescribed pain medication.   Do not take any additional acetaminophen as there is Tylenol in the pain medication.    Discharge wound care:  As directed     Comments:      Clean surgical sites with soap and water.  May shower the morning after surgery unless instructed by Dr. Leticia Penna otherwise.  No soaking for 2-3 weeks.    If adhesive strips are in place, they may be removed in 1-2 weeks while in the shower.    Driving Restrictions  As directed     Comments:      No driving while on pain medications.    Increase activity slowly  As directed     Lifting restrictions  As directed     Comments:      No lifting over 20lbs for 4-5 weeks post-op.        Medication List    TAKE these medications       aspirin EC 81 MG tablet  Take 81 mg by mouth every morning.     carvedilol 80 MG 24 hr capsule  Commonly known as:  COREG CR  Take 80 mg by mouth daily with breakfast.     ferrous sulfate 325 (65 FE) MG tablet  Take 325 mg by mouth daily with breakfast.     furosemide 80 MG tablet  Commonly known as:  LASIX  Take 80 mg by mouth daily with breakfast.     glipiZIDE 10 MG tablet  Commonly known  as:  GLUCOTROL  Take 10 mg by mouth daily.     HYDROcodone-acetaminophen 5-325 MG per tablet  Commonly known as:  NORCO/VICODIN  Take 1-2 tablets by mouth every 4 (four) hours as needed (moderate to severe pain).     JENTADUETO 2.03-999 MG Tabs  Generic drug:  Linagliptin-Metformin HCl  Take 1 tablet by mouth daily.     potassium chloride SA 20 MEQ tablet  Commonly known as:  K-DUR,KLOR-CON  Take 1 tablet (20 mEq total) by mouth 2 (two) times daily.     ramipril 10 MG tablet  Commonly known as:  ALTACE  Take 10 mg by mouth daily with breakfast.     traMADol 50 MG tablet  Commonly known as:  ULTRAM  Take 50 mg by mouth every 6 (six) hours as needed for pain.           Follow-up Information   Follow up with  Dalia Heading, MD. Schedule an appointment as soon as possible for a visit on 03/16/2013.   Contact information:   [address] [place] [address] [phone]       Signed: Fabio Bering 03/04/2013, 1:08 PM

## 2013-03-05 LAB — TYPE AND SCREEN
ABO/RH(D): A POS
Unit division: 0

## 2013-03-08 ENCOUNTER — Other Ambulatory Visit (HOSPITAL_COMMUNITY): Payer: Self-pay | Admitting: Oncology

## 2013-03-08 DIAGNOSIS — C189 Malignant neoplasm of colon, unspecified: Secondary | ICD-10-CM | POA: Insufficient documentation

## 2013-03-08 HISTORY — DX: Malignant neoplasm of colon, unspecified: C18.9

## 2013-03-13 ENCOUNTER — Other Ambulatory Visit (HOSPITAL_COMMUNITY): Payer: Self-pay | Admitting: *Deleted

## 2013-03-13 DIAGNOSIS — C801 Malignant (primary) neoplasm, unspecified: Secondary | ICD-10-CM

## 2013-03-13 MED ORDER — LIDOCAINE-PRILOCAINE 2.5-2.5 % EX CREA: TOPICAL_CREAM | CUTANEOUS | Status: DC

## 2013-03-13 MED ORDER — LORAZEPAM 1 MG PO TABS: ORAL_TABLET | ORAL | Status: DC

## 2013-03-13 MED ORDER — ONDANSETRON HCL 8 MG PO TABS: ORAL_TABLET | ORAL | Status: DC

## 2013-03-13 MED ORDER — DEXAMETHASONE 4 MG PO TABS: ORAL_TABLET | ORAL | Status: DC

## 2013-03-13 NOTE — Patient Instructions (Addendum)
[facility]   CHEMOTHERAPY INSTRUCTIONS  Oxaliplatin - anaphylactic reaction, neurotoxicity (i.e., headache, fatigue, difficulty sleeping, pain). Peripheral neuropathy (numbness/tingling/burning in hands/fingers/feet/toes) - will be aggravated by cold/cool temperatures. We need to know when you develop peripheral neuropathy so that we can monitor it and treat if necessary. Nausea/vomiting, diarrhea, bone marrow suppression (lowers white blood cells (fight infection), lowers red blood cells (make up your blood), lowers platelets (help blood to clot). Pulmonary fibrosis. Once you have received Oxaliplatin do NOT eat or drinking anything cold/cool for 5-10 days! Do NOT breathe in cold/cool air and do NOT touch anything cold for 5-10 days. The time frame varies from patient to patient on the length of time you must abstain from the above mentioned. Best advice is to wait at least 5 days before attempting to reintroduce cold/cool back into life. Slowly reintroduce cool/cold things! Wear gloves when getting items out of the refrigerator (of course, these would be things you are going to heat to eat)!   Avastin - hemorrhage (nosebleeds), high blood pressure, protein in the urine, wound healing complication, GI perforation, weakness, diarrhea, reduced white blood cell count.   Leucovorin - this is a medication that is not chemo but given with chemo. This med "rescues" the healthy cells before we administer the drug 5FU. This makes the 5FU work better.    5FU: bone marrow suppression (low white blood cells - wbcs fight infection, low red blood cells - rbcs make up your blood, low platelets - this is what makes your blood clot, nausea/vomiting, diarrhea, mouth sores, hair loss, dry skin, ocular toxicities (increased tear production, sensitivity to light). You must wear sunscreen/sunglasses. Cover your skin when out in sunlight. You will get burned very easily. (This is the  medicine that you will wear on a pump at home for 46 hours. It will infuse at a slow rate).     POTENTIAL SIDE EFFECTS OF TREATMENT: Increased Susceptibility to Infection, Vomiting, Hair Thinning, Bone Marrow Suppression, Abdominal Cramping, Nausea, Diarrhea, Sun Sensitivity and Mouth Sores   EDUCATIONAL MATERIALS GIVEN AND REVIEWED: Chemotherapy and You and Specific Instructions Sheets Oxaliplatin, 5FU, Avastin, Leucovorin, Dexamethasone, Zofran, Ativan, EMLA cream   SELF CARE ACTIVITIES WHILE ON CHEMOTHERAPY: Increase your fluid intake 48 hours prior to treatment and drink at least 2 quarts per day after treatment., No alcohol intake., No aspirin or other medications unless approved by your oncologist., Eat foods that are light and easy to digest., No fried, fatty, or spicy foods immediately before or after treatment., Have teeth cleaned professionally before starting treatment. Keep dentures and partial plates clean., Use soft toothbrush and do not use mouthwashes that contain alcohol. Biotene is a good mouthwash that is available at most pharmacies or may be ordered by calling [phone]., Use warm salt water gargles (1 teaspoon salt per 1 quart warm water) before and after meals and at bedtime. Or you may rinse with 2 tablespoons of three -percent hydrogen peroxide mixed in eight ounces of water., Always use sunscreen with SPF (Sun Protection Factor) of 30 or higher., Use your nausea medication as directed to prevent nausea., Use your stool softener or laxative as directed to prevent constipation. and Use your anti-diarrheal medication as directed to stop diarrhea.  Please wash your hands for at least 30 seconds using warm soapy water. Handwashing is the #1 way to prevent the spread of germs. Stay away from sick people or people who are getting over a cold. If you develop  respiratory systems such as green/yellow mucus production or productive cough or persistent cough let us know and we will  see if you need an antibiotic. It is a good idea to keep a pair of gloves on when going into grocery stores/[employer] to decrease your risk of coming into contact with germs on the carts, etc. Carry alcohol hand gel with you at all times and use it frequently if out in public. All foods need to be cooked thoroughly. No raw foods. No medium or undercooked meats, eggs. If your food is cooked medium well, it does not need to be hot pink or saturated with bloody liquid at all. Vegetables and fruits need to be washed/rinsed under the faucet with a dish detergent before being consumed. You can eat raw fruits and vegetables unless we tell you otherwise but it would be best if you cooked them or bought frozen. Do not eat off of salad bars or hot bars unless you really trust the cleanliness of the restaurant. If you need dental work, please let Dr. Mariel Sleet know before you go for your appointment so that we can coordinate the best possible time for you in regards to your chemo regimen. You need to also let your dentist know that you are actively taking chemo. We may need to do labs prior to your dental appointment. We also want your bowels moving at least every other day. If this is not happening, we need to know so that we can get you on a bowel regimen to help you go.     MEDICATIONS: You have been given prescriptions for the following medications:  Dexamethasone 4mg  tablet. Starting the day after chemo, take 2 tablets in the am for 2 days. Then Stop. Take with food.   Zofran/ondansetron 8mg  tablet. Starting the day after chemo, take 1 tablet in the am and 1 tablet in the pm for 2 days. Then may take 1 tablet two times a day IF needed for nausea/vomiting.   Ativan/lorazepam 1mg  tablet. May take 1 tablet every 4 hours IF needed for nausea/vomiting. May make you sleepy/drowsy.   EMLA cream. Apply a quarter size amount to port site 1 hour prior to chemo. Do not rub in. Cover with plastic.   Over-the-Counter  Meds:  Colace - this is a stool softener. Take 100mg  capsule 2-6 times a day as needed. If you have to take more than 6 capsules of Colace a day call the Cancer Center.  Senna - this is a mild laxative used to treat mild constipation. May take 2 tabs by mouth daily or up to twice a day as needed for mild constipation.  Milk of Magnesia - this is a laxative used to treat moderate to severe constipation. May take 2-4 tablespoons every 8 hours as needed. May increase to 8 tablespoons x 1 dose and if no bowel movement call the Cancer Center  Imodium - this is for diarrhea. Take 2 tabs after 1st loose stool and then 1 tab after each loose stool until you go a total of 12 hours without a loose stool. Call Cancer Center if loose stools continue.   SYMPTOMS TO REPORT AS SOON AS POSSIBLE AFTER TREATMENT:  FEVER GREATER THAN 100.5 F  CHILLS WITH OR WITHOUT FEVER  NAUSEA AND VOMITING THAT IS NOT CONTROLLED WITH YOUR NAUSEA MEDICATION  UNUSUAL SHORTNESS OF BREATH  UNUSUAL BRUISING OR BLEEDING  TENDERNESS IN MOUTH AND THROAT WITH OR WITHOUT PRESENCE OF ULCERS  URINARY PROBLEMS  BOWEL PROBLEMS  UNUSUAL RASH    Wear comfortable clothing and clothing appropriate for easy access to any Portacath or PICC line. Let us know if there is anything that we can do to make your therapy better!      I have been informed and understand all of the instructions given to me and have received a copy. I have been instructed to call the clinic [phone] or my family physician as soon as possible for continued medical care, if indicated. I do not have any more questions at this time but understand that I may call the Cancer Center or the Patient Navigator at [phone] during office hours should I have questions or need assistance in obtaining follow-up care.      _________________________________________      _______________     __________ Signature of Patient or Authorized Representative         Date                            Time      _________________________________________ Nurse's Signature

## 2013-03-14 ENCOUNTER — Ambulatory Visit (HOSPITAL_COMMUNITY): Payer: BC Managed Care – PPO

## 2013-03-14 ENCOUNTER — Ambulatory Visit (HOSPITAL_COMMUNITY): Payer: BC Managed Care – PPO | Admitting: Oncology

## 2013-03-14 ENCOUNTER — Telehealth (HOSPITAL_COMMUNITY): Payer: Self-pay | Admitting: Oncology

## 2013-03-14 NOTE — Telephone Encounter (Signed)
[employer] [phone] ?'D IF CPT CODES [phone] REQUIRE AUTH AND TO RECVD PLAN BENEFITS PER [place] NONE OF THE CODES REQUIRE AUTH $1,300 DED/80/20/OOP $4,500 ALL HAVE BEEN MET AND INS IS PAYING AT 100% UNTIL END OF YEAR. CALL YQM#[id number]

## 2013-03-18 ENCOUNTER — Encounter (HOSPITAL_COMMUNITY): Payer: Self-pay | Admitting: *Deleted

## 2013-03-18 ENCOUNTER — Emergency Department (HOSPITAL_COMMUNITY)
Admission: EM | Admit: 2013-03-18 | Discharge: 2013-03-18 | Disposition: A | Discharge status: Home / Self Care | Payer: BC Managed Care – PPO | Attending: Emergency Medicine | Admitting: Emergency Medicine

## 2013-03-18 DIAGNOSIS — Z862 Personal history of diseases of the blood and blood-forming organs and certain disorders involving the immune mechanism: Secondary | ICD-10-CM | POA: Insufficient documentation

## 2013-03-18 DIAGNOSIS — Z79899 Other long term (current) drug therapy: Secondary | ICD-10-CM | POA: Insufficient documentation

## 2013-03-18 DIAGNOSIS — Z85038 Personal history of other malignant neoplasm of large intestine: Secondary | ICD-10-CM | POA: Insufficient documentation

## 2013-03-18 DIAGNOSIS — T888XXA Other specified complications of surgical and medical care, not elsewhere classified, initial encounter: Secondary | ICD-10-CM

## 2013-03-18 DIAGNOSIS — Z87891 Personal history of nicotine dependence: Secondary | ICD-10-CM | POA: Insufficient documentation

## 2013-03-18 DIAGNOSIS — Y838 Other surgical procedures as the cause of abnormal reaction of the patient, or of later complication, without mention of misadventure at the time of the procedure: Secondary | ICD-10-CM | POA: Insufficient documentation

## 2013-03-18 DIAGNOSIS — I10 Essential (primary) hypertension: Secondary | ICD-10-CM | POA: Insufficient documentation

## 2013-03-18 DIAGNOSIS — Z9049 Acquired absence of other specified parts of digestive tract: Secondary | ICD-10-CM | POA: Insufficient documentation

## 2013-03-18 DIAGNOSIS — IMO0002 Reserved for concepts with insufficient information to code with codable children: Secondary | ICD-10-CM | POA: Insufficient documentation

## 2013-03-18 DIAGNOSIS — Z7982 Long term (current) use of aspirin: Secondary | ICD-10-CM | POA: Insufficient documentation

## 2013-03-18 DIAGNOSIS — D649 Anemia, unspecified: Secondary | ICD-10-CM | POA: Insufficient documentation

## 2013-03-18 DIAGNOSIS — E119 Type 2 diabetes mellitus without complications: Secondary | ICD-10-CM | POA: Insufficient documentation

## 2013-03-18 DIAGNOSIS — Z8679 Personal history of other diseases of the circulatory system: Secondary | ICD-10-CM | POA: Insufficient documentation

## 2013-03-18 DIAGNOSIS — Z8639 Personal history of other endocrine, nutritional and metabolic disease: Secondary | ICD-10-CM | POA: Insufficient documentation

## 2013-03-18 NOTE — ED Provider Notes (Signed)
History    This chart was scribed for Ward Givens, MD, by Frederik Pear, ED scribe. The patient was seen in room APA19/APA19 and the patient's care was started at 0919.    CSN: [id number]  Arrival date & time 03/18/13  7829   First MD Initiated Contact with Patient 03/18/13 [phone]      Chief Complaint  Patient presents with  . Post-op Problem    (Consider location/radiation/quality/duration/timing/severity/associated sxs/prior treatment) Patient is a 57 y.o. female presenting with wound check. The history is provided by the patient and medical records. No language interpreter was used.  Wound Check This is a new problem. The current episode started more than 1 week ago. The problem has been gradually improving. Pertinent negatives include no abdominal pain. She has tried nothing for the symptoms. The treatment provided no relief.    HPI Comments: Monica Allen is a 57 y.o. female with a h/o of a partial colectomy and left portacath placement performed on 04/23 by Dr. Lovell Sheehan who presents to the Emergency Department complaining of a wound check. She reports that she was seen for a follow up visit in his office 2 days ago to have staples removed. She states that the incision began bleeding yesterday described as a dripping of blood. She denies pain or other associated symptoms.    She is a nonsmoker. She also has a surgical history of a laparotomy, hysterectomy, salpingoophorectomy, and omentectomy performed by Dr. Everardo All on 12/13/12 and  a colonoscopy on 02/14/13 by Dr. Jonette Eva.  PCP is [facility].  Past Medical History  Diagnosis Date  . Diabetes mellitus   . Hypertension   . Enlarged heart   . Goiter   . Nonischemic cardiomyopathy   . Anemia   . Cancer     right colon- ovarian    Past Surgical History  Procedure Laterality Date  . Wisdom tooth extraction    . Laparotomy  12/13/2012    Procedure: EXPLORATORY LAPAROTOMY;  Surgeon: Jeannette Corpus, MD;   Location: [facility];  Service: Gynecology;  Laterality: N/A;  . Abdominal hysterectomy  12/13/2012    Procedure: HYSTERECTOMY ABDOMINAL;  Surgeon: Jeannette Corpus, MD;  Location: [facility];  Service: Gynecology;  Laterality: N/A;  . Salpingoophorectomy  12/13/2012    Procedure: SALPINGO OOPHORECTOMY;  Surgeon: Jeannette Corpus, MD;  Location: [facility];  Service: Gynecology;  Laterality: Bilateral;  . Omentectomy  12/13/2012    Procedure: OMENTECTOMY;  Surgeon: Jeannette Corpus, MD;  Location: [facility];  Service: Gynecology;  Laterality: N/A;  . Colonoscopy N/A 02/14/2013    Procedure: COLONOSCOPY;  Surgeon: West Bali, MD;  [facility]: [facility];  Service: Endoscopy;  Laterality: N/A;  2:00 PM-moved to 1115 Kim notified pt  . Partial colectomy N/A 03/01/2013    Procedure: PARTIAL COLECTOMY;  Surgeon: Dalia Heading, MD;  Location: [facility];  Service: General;  Laterality: N/A;  . Portacath placement Left 03/01/2013    Procedure: INSERTION PORT-A-CATH;  Surgeon: Dalia Heading, MD;  Location: [facility];  Service: General;  Laterality: Left;    Family History  Problem Relation Age of Onset  . Diabetes Mother   . Hypertension Mother   . Diabetes Sister   . Hypertension Sister   . Hypertension Brother   . Stroke Maternal Aunt   . Diabetes Maternal Grandmother   . Hypertension Maternal Grandmother   . Colon cancer Neg Hx     History  Substance Use Topics  .  Smoking status: Former Smoker -- 1.50 packs/day for 25 years    Types: Cigarettes    Quit date: 12/10/1987  . Smokeless tobacco: Not on file     Comment: quit 22 years ago  . Alcohol Use: Yes     Comment: occasionally    OB History   Grav Para Term Preterm Abortions TAB SAB Ect Mult Living                  Review of Systems  Gastrointestinal: Negative for abdominal pain.  Skin: Positive for wound.  All other systems reviewed and are negative.    Allergies  Actos and Ibuprofen  Home Medications   Current  Outpatient Rx  Name  Route  Sig  Dispense  Refill  . aspirin EC 81 MG tablet   Oral   Take 81 mg by mouth every morning.          . carvedilol (COREG CR) 80 MG 24 hr capsule   Oral   Take 80 mg by mouth daily with breakfast.          . dexamethasone (DECADRON) 4 MG tablet      Starting the day after chemo, take 2 tablets in the am for 2 days. Then Stop. Take with food.   24 tablet   1   . ferrous sulfate 325 (65 FE) MG tablet   Oral   Take 325 mg by mouth daily with breakfast.         . furosemide (LASIX) 80 MG tablet   Oral   Take 80 mg by mouth daily with breakfast.          . glipiZIDE (GLUCOTROL) 10 MG tablet   Oral   Take 10 mg by mouth daily.          Marland Kitchen HYDROcodone-acetaminophen (NORCO/VICODIN) 5-325 MG per tablet   Oral   Take 1-2 tablets by mouth every 4 (four) hours as needed (moderate to severe pain).   45 tablet   0   . lidocaine-prilocaine (EMLA) cream      Apply a quarter size amount to port site 1 hour prior to chemo. Do not rub in. Cover with plastic.   30 g   3   . Linagliptin-Metformin HCl (JENTADUETO) 2.03-999 MG TABS   Oral   Take 1 tablet by mouth daily.          Marland Kitchen LORazepam (ATIVAN) 1 MG tablet      May take 1 tab every 4 hours IF needed for nausea/vomiting.   30 tablet   0   . ondansetron (ZOFRAN) 8 MG tablet      Starting the day after chemo, take 1 tab in the am and 1 tab in the pm for 2 days. Then may take 1 tab two times a day IF needed for nausea/vomiting.   30 tablet   1   . potassium chloride SA (K-DUR,KLOR-CON) 20 MEQ tablet   Oral   Take 1 tablet (20 mEq total) by mouth 2 (two) times daily.   60 tablet   2   . ramipril (ALTACE) 10 MG tablet   Oral   Take 10 mg by mouth daily with breakfast.          . traMADol (ULTRAM) 50 MG tablet   Oral   Take 50 mg by mouth every 6 (six) hours as needed for pain.            BP 119/100  Temp(Src) 98.9 F (  37.2 C) (Oral)  Resp 16  Ht 5\' 2"  (1.575 m)  Wt 224 lb  (101.606 kg)  BMI 40.96 kg/m2  SpO2 99%  LMP 10/07/2012  Vital signs normal    Physical Exam  Nursing note and vitals reviewed. Constitutional: She is oriented to person, place, and time. She appears well-developed and well-nourished.  Non-toxic appearance. She does not appear ill. No distress.  HENT:  Head: Normocephalic and atraumatic.  Right Ear: External ear normal.  Left Ear: External ear normal.  Nose: Nose normal. No mucosal edema or rhinorrhea.  Mouth/Throat: Mucous membranes are normal. No dental abscesses or edematous.  Eyes: Conjunctivae and EOM are normal. Pupils are equal, round, and reactive to light.  Neck: Normal range of motion and full passive range of motion without pain. Neck supple.  Pulmonary/Chest: Effort normal. No respiratory distress. She has no rhonchi. She exhibits no crepitus.  Abdominal: Soft. Normal appearance and bowel sounds are normal. She exhibits no distension. There is no tenderness. There is no rebound and no guarding.  Well-healed midline surgical scar that is vertically oriented. New vertical incision that is in the right abdomen that is well-healed. There is a small superficial area that is a couple of millimeters in size that is not yet epithelialized and states that is the area that is draining blood. No induration or drainage around the incision with palpation.   Musculoskeletal: Normal range of motion. She exhibits no edema and no tenderness.  Moves all extremities well.   Neurological: She is alert and oriented to person, place, and time. She has normal strength. No cranial nerve deficit.  Skin: Skin is warm, dry and intact. No rash noted. No erythema. No pallor.  Psychiatric: She has a normal mood and affect. Her speech is normal and behavior is normal. Her mood appears not anxious.    ED Course  Procedures (including critical care time)  DIAGNOSTIC STUDIES: Oxygen Saturation is 99% on room air, normal by my interpretation.     COORDINATION OF CARE:  09:39- Discussed ordering a consult with the on call physician from Dr. York Ram practice.  10:05 Dr Lovell Sheehan, clean with soap and water, keep covered, if continues to leak call the office on Monday (2 days) to be seen Tuesday.    1. Post-op bleeding, initial encounter     Plan discharge  Devoria Albe, MD, FACEP   MDM   I personally performed the services described in this documentation, which was scribed in my presence. The recorded information has been reviewed and considered.  Devoria Albe, MD, FACEP         Ward Givens, MD 03/18/13 1017

## 2013-03-18 NOTE — ED Notes (Addendum)
Pt states she had a colectomy 2 weeks ago. Staples recently removed and steri strips placed. States she noticed serosanguinous  drainage last night

## 2013-03-18 NOTE — ED Notes (Signed)
Pt presents to er with concerns of "blood tinged" drainage from surgical site that started last night. Pt had partial colectomy performed two weeks ago, removed a steri-strip yesterday, noticed the drainage last night. Pt has 4X4's on area with serosanguinous drainage noted. Denies any pain.

## 2013-03-30 ENCOUNTER — Encounter (HOSPITAL_COMMUNITY): Payer: BC Managed Care – PPO | Attending: Oncology

## 2013-03-30 DIAGNOSIS — C778 Secondary and unspecified malignant neoplasm of lymph nodes of multiple regions: Secondary | ICD-10-CM

## 2013-03-30 DIAGNOSIS — C182 Malignant neoplasm of ascending colon: Secondary | ICD-10-CM

## 2013-03-30 DIAGNOSIS — C801 Malignant (primary) neoplasm, unspecified: Secondary | ICD-10-CM | POA: Insufficient documentation

## 2013-03-30 DIAGNOSIS — C7960 Secondary malignant neoplasm of unspecified ovary: Secondary | ICD-10-CM

## 2013-03-30 NOTE — Progress Notes (Signed)
Chemo teaching done and consent signed for oxaliplatin, 80fu, leucovorin, and avastin. Pump for [employer] paperwork signed also.

## 2013-04-04 ENCOUNTER — Encounter (HOSPITAL_BASED_OUTPATIENT_CLINIC_OR_DEPARTMENT_OTHER): Payer: BC Managed Care – PPO

## 2013-04-04 ENCOUNTER — Encounter: Payer: Self-pay | Admitting: *Deleted

## 2013-04-04 VITALS — BP 159/65 | HR 74 | Temp 98.2°F | Resp 16 | Wt 218.2 lb

## 2013-04-04 DIAGNOSIS — Z5111 Encounter for antineoplastic chemotherapy: Secondary | ICD-10-CM

## 2013-04-04 DIAGNOSIS — C7960 Secondary malignant neoplasm of unspecified ovary: Secondary | ICD-10-CM

## 2013-04-04 DIAGNOSIS — C182 Malignant neoplasm of ascending colon: Secondary | ICD-10-CM

## 2013-04-04 DIAGNOSIS — Z5112 Encounter for antineoplastic immunotherapy: Secondary | ICD-10-CM

## 2013-04-04 DIAGNOSIS — C801 Malignant (primary) neoplasm, unspecified: Secondary | ICD-10-CM

## 2013-04-04 LAB — CBC WITH DIFFERENTIAL/PLATELET
Basophils Absolute: 0 10*3/uL (ref 0.0–0.1)
Eosinophils Absolute: 0.1 10*3/uL (ref 0.0–0.7)
Eosinophils Relative: 1 % (ref 0–5)
HCT: 28.1 % — ABNORMAL LOW (ref 36.0–46.0)
Lymphs Abs: 2.5 10*3/uL (ref 0.7–4.0)
MCH: 19.6 pg — ABNORMAL LOW (ref 26.0–34.0)
MCV: 63.4 fL — ABNORMAL LOW (ref 78.0–100.0)
Monocytes Absolute: 0.5 10*3/uL (ref 0.1–1.0)
Platelets: 251 10*3/uL (ref 150–400)
RDW: 17.3 % — ABNORMAL HIGH (ref 11.5–15.5)

## 2013-04-04 LAB — COMPREHENSIVE METABOLIC PANEL
ALT: 7 U/L (ref 0–35)
CO2: 27 mEq/L (ref 19–32)
Calcium: 8.9 mg/dL (ref 8.4–10.5)
Creatinine, Ser: 0.51 mg/dL (ref 0.50–1.10)
GFR calc Af Amer: >90 mL/min (ref >90)
GFR calc non Af Amer: >90 mL/min (ref >90)
Glucose, Bld: 330 mg/dL — ABNORMAL HIGH (ref 70–99)
Sodium: 135 mEq/L (ref 135–145)
Total Protein: 7.4 g/dL (ref 6.0–8.3)

## 2013-04-04 MED ORDER — SODIUM CHLORIDE 0.9 % IV SOLN
Freq: Once | INTRAVENOUS | Status: AC
  Administered 2013-04-04: 8 mg via INTRAVENOUS
  Filled 2013-04-04: qty 4

## 2013-04-04 MED ORDER — LEUCOVORIN CALCIUM INJECTION 100 MG
20.0000 mg/m2 | Freq: Once | INTRAMUSCULAR | Status: AC
  Administered 2013-04-04: 44 mg via INTRAVENOUS
  Filled 2013-04-04: qty 2.2

## 2013-04-04 MED ORDER — OXALIPLATIN CHEMO INJECTION 100 MG/20ML
85.0000 mg/m2 | Freq: Once | INTRAVENOUS | Status: AC
  Administered 2013-04-04: 185 mg via INTRAVENOUS
  Filled 2013-04-04: qty 37

## 2013-04-04 MED ORDER — FLUOROURACIL CHEMO INJECTION 2.5 GM/50ML
400.0000 mg/m2 | Freq: Once | INTRAVENOUS | Status: AC
  Administered 2013-04-04: 850 mg via INTRAVENOUS
  Filled 2013-04-04: qty 17

## 2013-04-04 MED ORDER — SODIUM CHLORIDE 0.9 % IV SOLN
Freq: Once | INTRAVENOUS | Status: AC
  Administered 2013-04-04: 10:00:00 via INTRAVENOUS

## 2013-04-04 MED ORDER — DEXTROSE 5 % IV SOLN
Freq: Once | INTRAVENOUS | Status: AC
  Administered 2013-04-04: 11:00:00 via INTRAVENOUS

## 2013-04-04 MED ORDER — SODIUM CHLORIDE 0.9 % IV SOLN
2400.0000 mg/m2 | INTRAVENOUS | Status: DC
  Administered 2013-04-04: 5200 mg via INTRAVENOUS
  Filled 2013-04-04: qty 104

## 2013-04-04 MED ORDER — SODIUM CHLORIDE 0.9 % IV SOLN
5.0000 mg/kg | Freq: Once | INTRAVENOUS | Status: AC
  Administered 2013-04-04: 525 mg via INTRAVENOUS
  Filled 2013-04-04: qty 21

## 2013-04-04 MED ORDER — HEPARIN SOD (PORK) LOCK FLUSH 100 UNIT/ML IV SOLN
500.0000 [IU] | Freq: Once | INTRAVENOUS | Status: DC | PRN
  Filled 2013-04-04: qty 5

## 2013-04-06 ENCOUNTER — Encounter (HOSPITAL_BASED_OUTPATIENT_CLINIC_OR_DEPARTMENT_OTHER): Payer: BC Managed Care – PPO

## 2013-04-06 DIAGNOSIS — Z452 Encounter for adjustment and management of vascular access device: Secondary | ICD-10-CM

## 2013-04-06 DIAGNOSIS — C801 Malignant (primary) neoplasm, unspecified: Secondary | ICD-10-CM

## 2013-04-06 MED ORDER — HEPARIN SOD (PORK) LOCK FLUSH 100 UNIT/ML IV SOLN
INTRAVENOUS | Status: AC
  Filled 2013-04-06: qty 5

## 2013-04-06 MED ORDER — SODIUM CHLORIDE 0.9 % IJ SOLN
10.0000 mL | INTRAMUSCULAR | Status: DC | PRN
  Administered 2013-04-06: 10 mL
  Filled 2013-04-06: qty 10

## 2013-04-06 MED ORDER — HEPARIN SOD (PORK) LOCK FLUSH 100 UNIT/ML IV SOLN
500.0000 [IU] | Freq: Once | INTRAVENOUS | Status: AC | PRN
  Administered 2013-04-06: 500 [IU]
  Filled 2013-04-06: qty 5

## 2013-04-06 NOTE — Progress Notes (Signed)
Monica Allen presented for Portacath access and flush. Proper placement of portacath confirmed by CXR. Portacath located left chest wall. Good blood return present. Portacath flushed with 20ml NS and 500U/39ml Heparin and needle removed intact. Procedure without incident. Patient tolerated procedure well. Patient instructed to monitor small opening on scar above port for redness, discharge, pain, or other changes.  Patient also instructed to use a band-aid and change daily until opening closes.  Patient verbalized understanding.

## 2013-04-06 NOTE — Progress Notes (Signed)
Patient stated that she did well after her first chemotherapy treatment.  Denies any sickness, fatigue, or pain.  Appetite decreased from normal but she states that she is eating.  No complaints at this time.

## 2013-04-12 ENCOUNTER — Other Ambulatory Visit (HOSPITAL_COMMUNITY): Payer: Self-pay | Admitting: Oncology

## 2013-04-17 ENCOUNTER — Encounter (HOSPITAL_COMMUNITY): Payer: Self-pay | Admitting: Oncology

## 2013-04-17 NOTE — Progress Notes (Signed)
Monica Nurse, PA-C [address], [address] [place] [address]  Invasive adenocarcinoma of colon  Microcytic anemia - Plan: Iron and TIBC, Ferritin  CURRENT THERAPY: S/P 1 treatment of FOLFOX + Avastin beginning on 04/04/2013  INTERVAL HISTORY: Monica Allen 57 y.o. female returns for  regular  visit for followup of Invasive adenocarcinoma of colon (T4a, N2b, M1a) with invasion through the muscularis propria and into pericolonic fat involving the serosa, angiolymphatic invasion noted, 11/19 positive lymph nodes with extracapsular extension, KRAS WILDTYPE.  She is S/P her first cycle of chemotherapy on 04/04/2013  I personally reviewed and went over laboratory results with the patient..  Her labs are reviewed and she is noted to maintain an microcytic anemia with a Hgb of 8.7 with MCV 63.4.  WBC and platelet count WNL.  As a result, I will add iron studies to her pre-chemo lab work today.  She is tolerating therapy well.  She admits to trace nausea, but denies any emesis.  She does have antiemetic medication at home but she has not taken any.  She reports that the nausea lasted for only a few minutes and resolved spontaneously.   She asks about her weight loss. She is down 14 lbs since 5/10, but she is obese.  This is likely secondary to her malignancy.  She was educated about this.  We will continue to monitor her weight and intervene if necessary.  She thought that she has a "flesh eating bacteria eating her from the inside."  I put that theory to rest hopefully.   She also reports some abdominal tightness in her upper abdomen.  She denies any pain.  She denies any discomfort.  Abdominal exam is benign and this is a nonspecific complaint.   We reviewed her chemotherapy regimen (FOLFOX + Avastin).  She has already had chemo-teaching with our Allen navigator in the past.   Oncologically, she denies any complaints and ROS questioning is otherwise negative.   Past Medical History   Diagnosis Date  . Diabetes mellitus   . Hypertension   . Enlarged heart   . Goiter   . Nonischemic cardiomyopathy   . Anemia   . Cancer     right colon- ovarian  . Invasive adenocarcinoma of colon 03/08/2013    has THYROMEGALY; DIABETES MELLITUS, UNCONTROLLED, WITH RENAL COMPLICATIONS; ANXIETY; DEPRESSION; HYPERTENSION; CARDIOMYOPATHY, DILATED; CONGESTIVE HEART FAILURE; SINUSITIS, CHRONIC; ALLERGIC RHINITIS; GERD; ONYCHIA AND PARONYCHIA OF TOE; LOW BACK PAIN; Diabetes mellitus; and Invasive adenocarcinoma of colon on her problem list.     is allergic to actos and ibuprofen.  [name] does not currently have medications on file.  Past Surgical History  Procedure Laterality Date  . Wisdom tooth extraction    . Laparotomy  12/13/2012    Procedure: EXPLORATORY LAPAROTOMY;  Surgeon: Jeannette Corpus, MD;  Location: [facility];  Service: Gynecology;  Laterality: N/A;  . Abdominal hysterectomy  12/13/2012    Procedure: HYSTERECTOMY ABDOMINAL;  Surgeon: Jeannette Corpus, MD;  Location: [facility];  Service: Gynecology;  Laterality: N/A;  . Salpingoophorectomy  12/13/2012    Procedure: SALPINGO OOPHORECTOMY;  Surgeon: Jeannette Corpus, MD;  Location: [facility];  Service: Gynecology;  Laterality: Bilateral;  . Omentectomy  12/13/2012    Procedure: OMENTECTOMY;  Surgeon: Jeannette Corpus, MD;  Location: [facility];  Service: Gynecology;  Laterality: N/A;  . Colonoscopy N/A 02/14/2013    Procedure: COLONOSCOPY;  Surgeon: West Bali, MD;  [facility];  Service: Endoscopy;  Laterality: N/A;  2:00 PM-moved to 1115 Kim notified pt  . Partial colectomy N/A 03/01/2013    Procedure: PARTIAL COLECTOMY;  Surgeon: Dalia Heading, MD;  Location: [facility];  Service: General;  Laterality: N/A;  . Portacath placement Left 03/01/2013    Procedure: INSERTION PORT-A-CATH;  Surgeon: Dalia Heading, MD;  Location: [facility];  Service: General;  Laterality: Left;    Denies any headaches,  dizziness, double vision, fevers, chills, night sweats, vomiting, diarrhea, constipation, chest pain, heart palpitations, shortness of breath, blood in stool, black tarry stool, urinary pain, urinary burning, urinary frequency, hematuria.   PHYSICAL EXAMINATION  ECOG PERFORMANCE STATUS: 1 - Symptomatic but completely ambulatory  There were no vitals filed for this visit.  GENERAL:alert, no distress, well nourished, well developed, comfortable, cooperative, obese and smiling SKIN: skin color, texture, turgor are normal, no rashes or significant lesions HEAD: Normocephalic, No masses, lesions, tenderness or abnormalities EYES: normal, Conjunctiva are pink and non-injected EARS: External ears normal OROPHARYNX:mucous membranes are moist  NECK: supple, trachea midline LYMPH:  no palpable lymphadenopathy BREAST:not examined LUNGS: clear to auscultation  HEART: regular rate & rhythm, no murmurs, no gallops, S1 normal and S2 normal ABDOMEN:abdomen soft, non-tender, obese, normal bowel sounds, no masses or organomegaly and no hepatosplenomegaly BACK: Back symmetric, no curvature., No CVA tenderness EXTREMITIES:less then 2 second capillary refill, no joint deformities, effusion, or inflammation, no edema, no skin discoloration, no clubbing, no cyanosis  NEURO: alert & oriented x 3 with fluent speech, no focal motor/sensory deficits, gait normal   LABORATORY DATA: CBC    Component Value Date/Time   WBC 6.3 04/04/2013 0859   RBC 4.43 04/04/2013 0859   HGB 8.7* 04/04/2013 0859   HCT 28.1* 04/04/2013 0859   PLT 251 04/04/2013 0859   MCV 63.4* 04/04/2013 0859   MCH 19.6* 04/04/2013 0859   MCHC 31.0 04/04/2013 0859   RDW 17.3* 04/04/2013 0859   LYMPHSABS 2.5 04/04/2013 0859   MONOABS 0.5 04/04/2013 0859   EOSABS 0.1 04/04/2013 0859   BASOSABS 0.0 04/04/2013 0859      Chemistry      Component Value Date/Time   NA 135 04/04/2013 0859   K 3.7 04/04/2013 0859   CL 98 04/04/2013 0859   CO2 27 04/04/2013  0859   BUN 10 04/04/2013 0859   CREATININE 0.51 04/04/2013 0859      Component Value Date/Time   CALCIUM 8.9 04/04/2013 0859   ALKPHOS 74 04/04/2013 0859   AST 11 04/04/2013 0859   ALT 7 04/04/2013 0859   BILITOT 0.3 04/04/2013 0859     Lab Results  Component Value Date   CEA 2.8 04/04/2013     RADIOGRAPHIC STUDIES:  02/16/2013  *RADIOLOGY REPORT*  Clinical Data: Colon carcinoma  CT ABDOMEN AND PELVIS WITH CONTRAST  Technique: Multidetector CT imaging of the abdomen and pelvis was  performed following the standard protocol during bolus  administration of intravenous contrast.  Contrast: OMNIPAQUE IOHEXOL 300 MG/ML SOLN  Comparison: 10/27/2012  Findings:  Lung bases: The lung bases are clear. No pericardial or pleural  effusion.  Abdomen/pelvis: There is no liver abnormality identified. The  gallbladder appears within normal limits. No biliary dilatation.  Normal appearance of the pancreas. The spleen is normal.  Normal appearance of the right adrenal gland. Interval asymmetric  enlargement of the left adrenal gland. Suspicious for metastatic  disease.  The right kidney is normal. The left kidney is normal. The  urinary bladder appears normal. The patient  is status post  hysterectomy and bilateral salpingo-oophorectomy.  Multiple enlarged lymph nodes are identified within the upper  abdomen. Enlarged gastrohepatic ligament lymph node measures 1.3  cm, image 20/series 2. New from previous exam. There is a  periaortic lymph node which measures 1.9 cm, image 35/series 2.  Previously this measured 1.7 cm. There is a retrocaval node which  measures 1.2 cm, right lower quadrant ileocolic lymph node measures  1.2 cm, image 52/series 2. Previously this measured the same. No  adenopathy within the pelvis or inguinal region.  The stomach is normal. The small bowel loops have a normal  caliber. There is no evidence for obstruction. Mass within the  ascending colon is identified.  This measures approximately 6.5 cm  in length, image 50/series 4.  There is a small amount of free fluid noted within the dependent  portion of the pelvis.  Bones/Musculoskeletal: Review of the visualized bony structures  is significant for mild spondylosis. No aggressive lytic or  sclerotic bone lesions identified.  IMPRESSION:  1. Mass within the ascending colon is noted and is concerning for  primary colonic neoplasm.  2. Evidence of mesenteric and retroperitoneal metastatic  adenopathy.  3. 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  4. Small amount of ascites within the pelvis.  Original Report Authenticated By: Signa Kell, M.D.     PATHOLOGY:  03/01/2013  ADDITIONAL INFORMATION: The tumor block was sent to [facility] for molecular testing. IHC: No loss of the expression of MSH6, PMS2, MLH1 and equivocal staining for MSH2 ([id number]). Comment: Partial loss of expression of a major mismatch repair protein is noted. This finding may be related to tissue fixation / processing or may be the result of true microsatellite instability. This case has been forwarded to our Molecular Laboratory for assessment of microsatellite instability. MSI testing by PCR: Microsatellite stable (BAT-26, MR21, BAT-25, MONO 27, and NR 24: stable). Clearing number [id number]. (HCL:caf 03/10/13) KRAS mutation analysis: KRAS mutation not detected ([id number]) Abigail Miyamoto MD Pathologist, Electronic Signature ( Signed 03/14/2013) FINAL DIAGNOSIS Diagnosis Colon, segmental resection for tumor, right - INVASIVE ADENOCARCINOMA INVADING THROUGH THE MUSCULARIS PROPRIA INTO PERICOLONIC FATTY TISSUE, INVOLVING THE SEROSA. - ANGIOLYMPHATIC INVASION PRESENT. - ONE TUBULAR ADENOMA WITH NO EVIDENCE OF HIGH GRADE DYSPLASIA OR MALIGNANCY. - ELEVEN OF NINETEEN LYMPH NODES, POSITIVE FOR METASTATIC CARCINOMA WITH EXTRACAPSULAR EXTENSION (11/19). - RESECTION MARGINS CLEAR. - APPENDIX: BENIGN APPENDIX TISSUE, NO  EVIDENCE OF ACTIVE INFLAMMATION OR NEOPLASM. Microscopic Comment COLON AND RECTUM 1 of 3 FINAL for Bundrick, Elysia A ([id number]) Microscopic Comment(continued) Specimen: Right colon. Procedure: Segmental resection. Tumor site: Proximal ascending colon. Specimen integrity: Intact. Macroscopic intactness of mesorectum: N/A Macroscopic tumor perforation: Present. Invasive tumor: Maximum size: 5.0 cm gross measurement. Histologic type(s): Invasive adenocarcinoma. Histologic grade and differentiation: G2: moderately differentiated/low grade Type of polyp in which invasive carcinoma arose: N/A Microscopic extension of invasive tumor: Invading through the muscularis propria into pericolonic fatty tissue and involving the serosa. Lymph-Vascular invasion: Present. Peri-neural invasion: Present. Tumor deposit(s) (discontinuous extramural extension): N/A Resection margins: Proximal margin: 9 cm Distal margin: 20 cm Circumferential (radial) (posterior ascending, posterior descending; lateral and posterior mid-rectum; and entire lower 1/3 rectum): 3 cm Treatment effect (neoadjuvant therapy): No Number of lymph nodes examined - 19 ; Number positive- 11 with extracapsular extension. Additional polyp(s): One tubular adenoma with no evidence of high grade dysplasia or malignancy. Non-neoplastic findings: N/A Pathologic Staging: pT4a, pN2b, pM1 Ancillary studies: The tumor block will be sent out for MSI testing and KRAS  mutation analysis, an addendum report will follow. (HCL:gt, 03/06/13) Abigail Miyamoto MD Pathologist, Electronic Signature (Case signed 03/06/2013)     ASSESSMENT:  1.  Invasive adenocarcinoma of colon (T4a, N2b, M1a) with invasion through the muscularis propria and into pericolonic fat involving the serosa, angiolymphatic invasion noted, 11/19 positive lymph nodes with extracapsular extension, KRAS WILDTYPE.  On systemic chemotherapy beginning on 04/04/2013 with FOLFOX +  Avastin 2. Microcytic anemia, on ferrous sulfate 3. Weight loss, down 14 lbs since 5/10  Patient Active Problem List   Diagnosis Date Noted  . Invasive adenocarcinoma of colon 03/08/2013  . Diabetes mellitus 12/16/2012  . ONYCHIA AND PARONYCHIA OF TOE 05/29/2009  . DIABETES MELLITUS, UNCONTROLLED, WITH RENAL COMPLICATIONS 05/01/2009  . 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 ANXIETY 10/03/2007  . DEPRESSION 10/03/2007  . HYPERTENSION 10/03/2007  . CARDIOMYOPATHY, DILATED 10/03/2007  . CONGESTIVE HEART FAILURE 10/03/2007  . SINUSITIS, CHRONIC 10/03/2007  . ALLERGIC RHINITIS 10/03/2007  . GERD 10/03/2007  . LOW BACK PAIN 10/03/2007     PLAN:  1. I personally reviewed and went over laboratory results with the patient. 2. I personally reviewed and went over radiographic studies with the patient. 3. I personally reviewed and went over pathology results with the patient. 4. Additional pre-chemo labs: Iron/TIBC, Ferritin 5. Will administer Feraheme IV 1020 mg if iron studies remain low.   6. Pre-chemo labs as ordered: CBC diff, CMET, UA 7. CEA every 4 weeks.  8. Return in 4 weeks for follow-up.  By that time, the patient will be S/P 4 treatments and at that time, restaging scans can be ordered and scheduled one month later.   All questions were answered. The patient knows to call the clinic with any problems, questions or concerns. We can certainly see the patient much sooner if necessary.  Patient and plan discussed with Dr. Erline Hau and he is in agreement with the aforementioned.   KEFALAS,THOMAS

## 2013-04-18 ENCOUNTER — Encounter (HOSPITAL_COMMUNITY): Payer: Self-pay | Admitting: Oncology

## 2013-04-18 ENCOUNTER — Encounter (HOSPITAL_BASED_OUTPATIENT_CLINIC_OR_DEPARTMENT_OTHER): Payer: BC Managed Care – PPO | Admitting: Oncology

## 2013-04-18 ENCOUNTER — Encounter (HOSPITAL_COMMUNITY): Payer: BC Managed Care – PPO | Attending: Oncology

## 2013-04-18 VITALS — BP 147/60 | HR 79 | Temp 98.0°F | Resp 18 | Wt 210.4 lb

## 2013-04-18 DIAGNOSIS — C189 Malignant neoplasm of colon, unspecified: Secondary | ICD-10-CM

## 2013-04-18 DIAGNOSIS — C7960 Secondary malignant neoplasm of unspecified ovary: Secondary | ICD-10-CM

## 2013-04-18 DIAGNOSIS — Z5111 Encounter for antineoplastic chemotherapy: Secondary | ICD-10-CM

## 2013-04-18 DIAGNOSIS — C182 Malignant neoplasm of ascending colon: Secondary | ICD-10-CM

## 2013-04-18 DIAGNOSIS — C778 Secondary and unspecified malignant neoplasm of lymph nodes of multiple regions: Secondary | ICD-10-CM

## 2013-04-18 DIAGNOSIS — D509 Iron deficiency anemia, unspecified: Secondary | ICD-10-CM

## 2013-04-18 DIAGNOSIS — C801 Malignant (primary) neoplasm, unspecified: Secondary | ICD-10-CM

## 2013-04-18 LAB — COMPREHENSIVE METABOLIC PANEL
ALT: 14 U/L (ref 0–35)
Alkaline Phosphatase: 79 U/L (ref 39–117)
BUN: 7 mg/dL (ref 6–23)
CO2: 30 mEq/L (ref 19–32)
Chloride: 99 mEq/L (ref 96–112)
GFR calc Af Amer: >90 mL/min (ref >90)
Glucose, Bld: 190 mg/dL — ABNORMAL HIGH (ref 70–99)
Potassium: 3.7 mEq/L (ref 3.5–5.1)
Sodium: 139 mEq/L (ref 135–145)
Total Bilirubin: 0.2 mg/dL — ABNORMAL LOW (ref 0.3–1.2)
Total Protein: 7.6 g/dL (ref 6.0–8.3)

## 2013-04-18 LAB — CBC WITH DIFFERENTIAL/PLATELET
Eosinophils Absolute: 0.1 10*3/uL (ref 0.0–0.7)
Hemoglobin: 8.8 g/dL — ABNORMAL LOW (ref 12.0–15.0)
Lymphocytes Relative: 34 % (ref 12–46)
Lymphs Abs: 2.4 10*3/uL (ref 0.7–4.0)
MCH: 19.5 pg — ABNORMAL LOW (ref 26.0–34.0)
Monocytes Relative: 13 % — ABNORMAL HIGH (ref 3–12)
Neutro Abs: 3.5 10*3/uL (ref 1.7–7.7)
Neutrophils Relative %: 51 % (ref 43–77)
Platelets: 234 10*3/uL (ref 150–400)
RBC: 4.52 MIL/uL (ref 3.87–5.11)
WBC: 6.9 10*3/uL (ref 4.0–10.5)

## 2013-04-18 LAB — URINALYSIS, DIPSTICK ONLY
Glucose, UA: NEGATIVE mg/dL
Hgb urine dipstick: NEGATIVE
Protein, ur: 30 mg/dL — AB

## 2013-04-18 MED ORDER — SODIUM CHLORIDE 0.9 % IV SOLN
INTRAVENOUS | Status: DC
Start: 2013-04-18 — End: 2013-04-18
  Administered 2013-04-18: 11:00:00 via INTRAVENOUS

## 2013-04-18 MED ORDER — SODIUM CHLORIDE 0.9 % IV SOLN
2400.0000 mg/m2 | INTRAVENOUS | Status: DC
  Administered 2013-04-18: 5200 mg via INTRAVENOUS
  Filled 2013-04-18: qty 104

## 2013-04-18 MED ORDER — OXALIPLATIN CHEMO INJECTION 100 MG/20ML
85.0000 mg/m2 | Freq: Once | INTRAVENOUS | Status: AC
  Administered 2013-04-18: 185 mg via INTRAVENOUS
  Filled 2013-04-18: qty 37

## 2013-04-18 MED ORDER — FLUOROURACIL CHEMO INJECTION 2.5 GM/50ML
400.0000 mg/m2 | Freq: Once | INTRAVENOUS | Status: AC
  Administered 2013-04-18: 850 mg via INTRAVENOUS
  Filled 2013-04-18: qty 17

## 2013-04-18 MED ORDER — DEXTROSE 5 % IV SOLN
Freq: Once | INTRAVENOUS | Status: AC
  Administered 2013-04-18: 12:00:00 via INTRAVENOUS

## 2013-04-18 MED ORDER — LEUCOVORIN CALCIUM INJECTION 100 MG
20.0000 mg/m2 | Freq: Once | INTRAMUSCULAR | Status: AC
  Administered 2013-04-18: 44 mg via INTRAVENOUS
  Filled 2013-04-18: qty 2.2

## 2013-04-18 MED ORDER — SODIUM CHLORIDE 0.9 % IV SOLN
Freq: Once | INTRAVENOUS | Status: AC
  Administered 2013-04-18: 8 mg via INTRAVENOUS
  Filled 2013-04-18: qty 4

## 2013-04-18 MED ORDER — SODIUM CHLORIDE 0.9 % IV SOLN
5.0000 mg/kg | Freq: Once | INTRAVENOUS | Status: AC
  Administered 2013-04-18: 525 mg via INTRAVENOUS
  Filled 2013-04-18: qty 21

## 2013-04-18 NOTE — Patient Instructions (Signed)
[facility] Discharge Instructions  RECOMMENDATIONS MADE BY THE CONSULTANT AND ANY TEST RESULTS WILL BE SENT TO YOUR REFERRING PHYSICIAN.  MEDICATIONS PRESCRIBED:  None  INSTRUCTIONS GIVEN AND DISCUSSED: None  SPECIAL INSTRUCTIONS/FOLLOW-UP: Return in 4 weeks for follow-up. Utilize anti-nausea medicine if needed Avoid forcing food.  Eat when hungry and you want to eat.  Thank you for choosing [name] to provide your oncology and hematology care.  To afford each patient quality time with our providers, please arrive at least 15 minutes before your scheduled appointment time.  With your help, our goal is to use those 15 minutes to complete the necessary work-up to ensure our physicians have the information they need to help with your evaluation and healthcare recommendations.    Effective January 1st, 2014, we ask that you re-schedule your appointment with our physicians should you arrive 10 or more minutes late for your appointment.  We strive to give you quality time with our providers, and arriving late affects you and other patients whose appointments are after yours.    Again, thank you for choosing [facility].  Our hope is that these requests will decrease the amount of time that you wait before being seen by our physicians.       _____________________________________________________________  Should you have questions after your visit to [facility], please contact our office at [phone] between the hours of 8:30 a.m. and 5:00 p.m.  Voicemails left after 4:30 p.m. will not be returned until the following business day.  For prescription refill requests, have your pharmacy contact our office with your prescription refill request.

## 2013-04-19 ENCOUNTER — Other Ambulatory Visit (HOSPITAL_COMMUNITY): Payer: Self-pay | Admitting: Oncology

## 2013-04-19 ENCOUNTER — Telehealth (HOSPITAL_COMMUNITY): Payer: Self-pay | Admitting: Dietician

## 2013-04-19 DIAGNOSIS — C189 Malignant neoplasm of colon, unspecified: Secondary | ICD-10-CM

## 2013-04-19 LAB — FERRITIN: Ferritin: 58 ng/mL (ref 10–291)

## 2013-04-19 LAB — IRON AND TIBC
Saturation Ratios: 3 % — ABNORMAL LOW (ref 20–55)
TIBC: 380 ug/dL (ref 250–470)

## 2013-04-19 NOTE — Telephone Encounter (Signed)
Nutrition Note  Pt identified for weight loss on [facility] Nutrition Screen.   Wt Readings from Last 30 Encounters:  04/18/13 210 lb 6.4 oz (95.437 kg)  04/04/13 218 lb 3.2 oz (98.975 kg)  03/18/13 224 lb (101.606 kg)  03/01/13 233 lb (105.688 kg)  03/01/13 233 lb (105.688 kg)  02/27/13 233 lb (105.688 kg)  02/17/13 237 lb (107.502 kg)  02/14/13 239 lb (108.41 kg)  02/14/13 239 lb (108.41 kg)  02/08/13 239 lb 3.2 oz (108.5 kg)  02/06/13 235 lb 12.8 oz (106.958 kg)  12/27/12 256 lb (116.121 kg)  12/13/12 289 lb (131.09 kg)  12/13/12 289 lb (131.09 kg)  12/09/12 289 lb (131.09 kg)  11/23/12 287 lb 12.8 oz (130.545 kg)  10/26/12 270 lb (122.471 kg)  10/11/11 300 lb (136.079 kg)  05/29/09 295 lb (133.811 kg)  05/01/09 298 lb 12 oz (135.512 kg)  04/02/09 294 lb 12 oz (133.698 kg)  12/31/08 301 lb (136.533 kg)  01/03/08 289 lb (131.09 kg)  10/03/07 279 lb (126.554 kg)   Chart reviewed. Pt with colon CA, currently on chemo, followed by [facility].  Wt hx reveals UBW of 300#. 22% wt loss x 6 months, 10.6% wt loss x 3 months, and 6.3% wt loss x 1 month are all clinically significant. Per MD notes, pt is concerned about weight loss.  While pt is obese, hx of significant wt loss puts pt at risk for malnutrition.  Called pt home at 1415. Pt unavailable.  Will continue to follow. If further nutrition issues arise, please consult RD.  Melody Haver, RD, LDN Pager: [phone]

## 2013-04-20 ENCOUNTER — Encounter (HOSPITAL_COMMUNITY): Payer: BC Managed Care – PPO

## 2013-04-20 ENCOUNTER — Encounter (HOSPITAL_BASED_OUTPATIENT_CLINIC_OR_DEPARTMENT_OTHER): Payer: BC Managed Care – PPO

## 2013-04-20 VITALS — BP 142/64 | HR 72 | Temp 98.1°F | Resp 16

## 2013-04-20 DIAGNOSIS — D509 Iron deficiency anemia, unspecified: Secondary | ICD-10-CM

## 2013-04-20 DIAGNOSIS — C189 Malignant neoplasm of colon, unspecified: Secondary | ICD-10-CM

## 2013-04-20 DIAGNOSIS — C801 Malignant (primary) neoplasm, unspecified: Secondary | ICD-10-CM

## 2013-04-20 MED ORDER — SODIUM CHLORIDE 0.9 % IJ SOLN
10.0000 mL | INTRAMUSCULAR | Status: DC | PRN
  Administered 2013-04-20: 10 mL
  Filled 2013-04-20: qty 10

## 2013-04-20 MED ORDER — SODIUM CHLORIDE 0.9 % IV SOLN
1020.0000 mg | Freq: Once | INTRAVENOUS | Status: AC
  Administered 2013-04-20: 1020 mg via INTRAVENOUS
  Filled 2013-04-20: qty 34

## 2013-04-20 MED ORDER — HEPARIN SOD (PORK) LOCK FLUSH 100 UNIT/ML IV SOLN
500.0000 [IU] | Freq: Once | INTRAVENOUS | Status: AC | PRN
  Administered 2013-04-20: 500 [IU]
  Filled 2013-04-20: qty 5

## 2013-04-20 MED ORDER — SODIUM CHLORIDE 0.9 % IV SOLN
INTRAVENOUS | Status: DC
  Administered 2013-04-20: 250 mL via INTRAVENOUS

## 2013-04-20 MED ORDER — HEPARIN SOD (PORK) LOCK FLUSH 100 UNIT/ML IV SOLN
INTRAVENOUS | Status: AC
  Filled 2013-04-20: qty 5

## 2013-04-20 NOTE — Progress Notes (Signed)
Monica Allen presented for Portacath access and flush. Proper placement of portacath confirmed by CXR. Portacath located left chest wall accessed with  H 20 needle. Good blood return present.  Chemotherapy pump removed. Portacath flushed with 20ml NS and 500U/1ml Heparin and needle removed intact. Procedure without incident. Patient tolerated procedure well.

## 2013-05-02 ENCOUNTER — Other Ambulatory Visit (HOSPITAL_COMMUNITY): Payer: Self-pay | Admitting: Oncology

## 2013-05-02 ENCOUNTER — Encounter (HOSPITAL_BASED_OUTPATIENT_CLINIC_OR_DEPARTMENT_OTHER): Payer: BC Managed Care – PPO

## 2013-05-02 VITALS — BP 139/82 | HR 78 | Temp 97.9°F | Resp 16 | Wt 208.0 lb

## 2013-05-02 DIAGNOSIS — C189 Malignant neoplasm of colon, unspecified: Secondary | ICD-10-CM

## 2013-05-02 DIAGNOSIS — C182 Malignant neoplasm of ascending colon: Secondary | ICD-10-CM

## 2013-05-02 DIAGNOSIS — Z5112 Encounter for antineoplastic immunotherapy: Secondary | ICD-10-CM

## 2013-05-02 DIAGNOSIS — Z5111 Encounter for antineoplastic chemotherapy: Secondary | ICD-10-CM

## 2013-05-02 DIAGNOSIS — C7960 Secondary malignant neoplasm of unspecified ovary: Secondary | ICD-10-CM

## 2013-05-02 DIAGNOSIS — C801 Malignant (primary) neoplasm, unspecified: Secondary | ICD-10-CM

## 2013-05-02 LAB — COMPREHENSIVE METABOLIC PANEL
ALT: 10 U/L (ref 0–35)
AST: 13 U/L (ref 0–37)
Albumin: 3.2 g/dL — ABNORMAL LOW (ref 3.5–5.2)
CO2: 29 mEq/L (ref 19–32)
Calcium: 8.9 mg/dL (ref 8.4–10.5)
Chloride: 101 mEq/L (ref 96–112)
GFR calc non Af Amer: >90 mL/min (ref >90)
Sodium: 142 mEq/L (ref 135–145)

## 2013-05-02 LAB — CBC WITH DIFFERENTIAL/PLATELET
Basophils Absolute: 0 10*3/uL (ref 0.0–0.1)
Eosinophils Relative: 1 % (ref 0–5)
Lymphocytes Relative: 37 % (ref 12–46)
Neutro Abs: 3.6 10*3/uL (ref 1.7–7.7)
Platelets: 196 10*3/uL (ref 150–400)
RDW: 22.4 % — ABNORMAL HIGH (ref 11.5–15.5)
WBC: 7.1 10*3/uL (ref 4.0–10.5)

## 2013-05-02 LAB — URINALYSIS, DIPSTICK ONLY
Glucose, UA: NEGATIVE mg/dL
Hgb urine dipstick: NEGATIVE
Leukocytes, UA: NEGATIVE
pH: 6 (ref 5.0–8.0)

## 2013-05-02 LAB — CEA: CEA: 2.8 ng/mL (ref 0.0–5.0)

## 2013-05-02 MED ORDER — SODIUM CHLORIDE 0.9 % IV SOLN
5.0000 mg/kg | Freq: Once | INTRAVENOUS | Status: AC
  Administered 2013-05-02: 525 mg via INTRAVENOUS
  Filled 2013-05-02: qty 21

## 2013-05-02 MED ORDER — SODIUM CHLORIDE 0.9 % IJ SOLN
10.0000 mL | INTRAMUSCULAR | Status: DC | PRN
  Administered 2013-05-02: 10 mL
  Filled 2013-05-02: qty 10

## 2013-05-02 MED ORDER — FLUOROURACIL CHEMO INJECTION 2.5 GM/50ML
400.0000 mg/m2 | Freq: Once | INTRAVENOUS | Status: AC
  Administered 2013-05-02: 850 mg via INTRAVENOUS
  Filled 2013-05-02: qty 17

## 2013-05-02 MED ORDER — SODIUM CHLORIDE 0.9 % IV SOLN
Freq: Once | INTRAVENOUS | Status: AC
  Administered 2013-05-02: 8 mg via INTRAVENOUS
  Filled 2013-05-02: qty 4

## 2013-05-02 MED ORDER — SODIUM CHLORIDE 0.9 % IV SOLN
2400.0000 mg/m2 | INTRAVENOUS | Status: DC
  Administered 2013-05-02: 5200 mg via INTRAVENOUS
  Filled 2013-05-02: qty 104

## 2013-05-02 MED ORDER — LEUCOVORIN CALCIUM INJECTION 100 MG
20.0000 mg/m2 | Freq: Once | INTRAMUSCULAR | Status: AC
  Administered 2013-05-02: 44 mg via INTRAVENOUS
  Filled 2013-05-02: qty 2.2

## 2013-05-02 MED ORDER — HEPARIN SOD (PORK) LOCK FLUSH 100 UNIT/ML IV SOLN
500.0000 [IU] | Freq: Once | INTRAVENOUS | Status: DC | PRN
  Filled 2013-05-02: qty 5

## 2013-05-02 MED ORDER — DEXTROSE 5 % IV SOLN
85.0000 mg/m2 | Freq: Once | INTRAVENOUS | Status: AC
  Administered 2013-05-02: 185 mg via INTRAVENOUS
  Filled 2013-05-02: qty 37

## 2013-05-02 NOTE — Progress Notes (Signed)
Tolerated chemo well. 

## 2013-05-04 ENCOUNTER — Encounter (HOSPITAL_BASED_OUTPATIENT_CLINIC_OR_DEPARTMENT_OTHER): Payer: BC Managed Care – PPO

## 2013-05-04 VITALS — BP 146/129 | HR 57 | Temp 98.4°F | Resp 18

## 2013-05-04 DIAGNOSIS — C801 Malignant (primary) neoplasm, unspecified: Secondary | ICD-10-CM

## 2013-05-04 DIAGNOSIS — Z452 Encounter for adjustment and management of vascular access device: Secondary | ICD-10-CM

## 2013-05-04 MED ORDER — HEPARIN SOD (PORK) LOCK FLUSH 100 UNIT/ML IV SOLN
500.0000 [IU] | Freq: Once | INTRAVENOUS | Status: AC | PRN
  Administered 2013-05-04: 500 [IU]
  Filled 2013-05-04: qty 5

## 2013-05-04 MED ORDER — SODIUM CHLORIDE 0.9 % IJ SOLN
10.0000 mL | INTRAMUSCULAR | Status: DC | PRN
  Administered 2013-05-04: 10 mL
  Filled 2013-05-04: qty 10

## 2013-05-04 MED ORDER — HEPARIN SOD (PORK) LOCK FLUSH 100 UNIT/ML IV SOLN
INTRAVENOUS | Status: AC
  Filled 2013-05-04: qty 5

## 2013-05-04 NOTE — Progress Notes (Signed)
D/C continuous infusion pump. Flushed port per protocol. Pt denies complaints related to chemotherapy.

## 2013-05-16 ENCOUNTER — Encounter (HOSPITAL_BASED_OUTPATIENT_CLINIC_OR_DEPARTMENT_OTHER): Payer: BC Managed Care – PPO

## 2013-05-16 ENCOUNTER — Encounter (HOSPITAL_COMMUNITY): Payer: BC Managed Care – PPO | Attending: Oncology

## 2013-05-16 DIAGNOSIS — C778 Secondary and unspecified malignant neoplasm of lymph nodes of multiple regions: Secondary | ICD-10-CM

## 2013-05-16 DIAGNOSIS — C182 Malignant neoplasm of ascending colon: Secondary | ICD-10-CM

## 2013-05-16 DIAGNOSIS — C7960 Secondary malignant neoplasm of unspecified ovary: Secondary | ICD-10-CM

## 2013-05-16 DIAGNOSIS — Z5112 Encounter for antineoplastic immunotherapy: Secondary | ICD-10-CM

## 2013-05-16 DIAGNOSIS — C801 Malignant (primary) neoplasm, unspecified: Secondary | ICD-10-CM

## 2013-05-16 DIAGNOSIS — C189 Malignant neoplasm of colon, unspecified: Secondary | ICD-10-CM

## 2013-05-16 LAB — COMPREHENSIVE METABOLIC PANEL
ALT: 12 U/L (ref 0–35)
Albumin: 3 g/dL — ABNORMAL LOW (ref 3.5–5.2)
Alkaline Phosphatase: 74 U/L (ref 39–117)
BUN: 8 mg/dL (ref 6–23)
Chloride: 104 mEq/L (ref 96–112)
Glucose, Bld: 170 mg/dL — ABNORMAL HIGH (ref 70–99)
Potassium: 3.7 mEq/L (ref 3.5–5.1)
Total Bilirubin: 0.2 mg/dL — ABNORMAL LOW (ref 0.3–1.2)

## 2013-05-16 LAB — CBC WITH DIFFERENTIAL/PLATELET
Basophils Absolute: 0 10*3/uL (ref 0.0–0.1)
Eosinophils Relative: 2 % (ref 0–5)
Lymphocytes Relative: 44 % (ref 12–46)
MCV: 72.3 fL — ABNORMAL LOW (ref 78.0–100.0)
Neutrophils Relative %: 45 % (ref 43–77)
Platelets: 135 10*3/uL — ABNORMAL LOW (ref 150–400)
RDW: 25.7 % — ABNORMAL HIGH (ref 11.5–15.5)
WBC: 6.7 10*3/uL (ref 4.0–10.5)

## 2013-05-16 LAB — URINALYSIS, DIPSTICK ONLY
Leukocytes, UA: NEGATIVE
Specific Gravity, Urine: >1.03 — ABNORMAL HIGH (ref 1.005–1.030)
Urobilinogen, UA: 1 mg/dL (ref 0.0–1.0)

## 2013-05-16 MED ORDER — HEPARIN SOD (PORK) LOCK FLUSH 100 UNIT/ML IV SOLN
500.0000 [IU] | Freq: Once | INTRAVENOUS | Status: DC | PRN
  Filled 2013-05-16: qty 5

## 2013-05-16 MED ORDER — SODIUM CHLORIDE 0.9 % IV SOLN
2400.0000 mg/m2 | INTRAVENOUS | Status: DC
  Administered 2013-05-16: 5200 mg via INTRAVENOUS
  Filled 2013-05-16: qty 104

## 2013-05-16 MED ORDER — SODIUM CHLORIDE 0.9 % IV SOLN
Freq: Once | INTRAVENOUS | Status: AC
  Administered 2013-05-16: 10:00:00 via INTRAVENOUS

## 2013-05-16 MED ORDER — LEUCOVORIN CALCIUM INJECTION 100 MG
20.0000 mg/m2 | Freq: Once | INTRAMUSCULAR | Status: AC
  Administered 2013-05-16: 44 mg via INTRAVENOUS
  Filled 2013-05-16: qty 2.2

## 2013-05-16 MED ORDER — OXALIPLATIN CHEMO INJECTION 100 MG/20ML
85.0000 mg/m2 | Freq: Once | INTRAVENOUS | Status: AC
  Administered 2013-05-16: 185 mg via INTRAVENOUS
  Filled 2013-05-16: qty 37

## 2013-05-16 MED ORDER — DEXTROSE 5 % IV SOLN
Freq: Once | INTRAVENOUS | Status: AC
  Administered 2013-05-16: 11:00:00 via INTRAVENOUS

## 2013-05-16 MED ORDER — FLUOROURACIL CHEMO INJECTION 2.5 GM/50ML
400.0000 mg/m2 | Freq: Once | INTRAVENOUS | Status: AC
  Administered 2013-05-16: 850 mg via INTRAVENOUS
  Filled 2013-05-16: qty 17

## 2013-05-16 MED ORDER — SODIUM CHLORIDE 0.9 % IJ SOLN
10.0000 mL | INTRAMUSCULAR | Status: DC | PRN
  Filled 2013-05-16: qty 10

## 2013-05-16 MED ORDER — SODIUM CHLORIDE 0.9 % IV SOLN
5.0000 mg/kg | Freq: Once | INTRAVENOUS | Status: AC
  Administered 2013-05-16: 525 mg via INTRAVENOUS
  Filled 2013-05-16: qty 21

## 2013-05-16 MED ORDER — SODIUM CHLORIDE 0.9 % IV SOLN
Freq: Once | INTRAVENOUS | Status: AC
  Administered 2013-05-16: 8 mg via INTRAVENOUS
  Filled 2013-05-16: qty 4

## 2013-05-16 NOTE — Progress Notes (Signed)
History of present illness: Patient returns to clinic today for routine followup and consideration of her next infusion of FOLFOX plus Avastin. She has some mild cold neuropathy, but is otherwise tolerating her treatments well. Her tongue is slightly sore, but does not affect her speaking her eating habits. She is also concerned about a loose tooth. She denies any neurologic complaints. She is a good appetite and denies weight loss. She has no recent fevers. He denies any chest pain or shortness of breath. She has no nausea, vomiting, constipation, or diarrhea. She denies any melena or hematochezia. She has no urinary complaints. Patient otherwise feels well and offers no further specific complaints.  Review of systems as per history of present illness.  General:  Well-developed, well-nourished, no acute distress. Oropharynx: Loose tooth noted without evidence of infection. No thrush noted Mental status: Normal affect. Eyes: Anicteric sclera. Respiratory: Clear to auscultation bilaterally. Cardiovascular: Regular rate and rhythm, no rubs, murmurs, or gallops. Gastrointestinal: Soft, nontender, nondistended, normoactive bowel sounds. Musculoskeletal: No edema. Skin: No rashes or petechiae noted. Neurological: Alert, answering all questions appropriately. Cranial nerves grossly intact.   1. Invasive adenocarcinoma of colon (T4a, N2b, M1a) with invasion through the muscularis propria and into pericolonic fat involving the serosa, angiolymphatic invasion noted, 11/19 positive lymph nodes with extracapsular extension, KRAS WILDTYPE. On systemic chemotherapy beginning on 04/04/2013 with FOLFOX + Avastin.  Laboratory work is adequate to proceed with her 4th infusion today.  Plan to reimage after completion of her sixth infusion. Continue to monitor CEA every 4 weeks.   Return to clinic in 2 weeks for laboratory work and her next infusion of FOLFOX plus Avastin and then in 4 weeks for further evaluation.  Patient understands he can return to clinic at any time if she has any questions, concerns, or complaints. 2. Microcytic anemia: Improved with 1020 mg Feraheme given on 04/20/2013. Patient has discontinued her oral iron supplementation. Monitor. 3. Loose tooth: No intervention needed at this time, we'll consider a dental referral if the tooth becomes painful or abscessed.

## 2013-05-16 NOTE — Patient Instructions (Addendum)
.  [facility] Discharge Instructions  RECOMMENDATIONS MADE BY THE CONSULTANT AND ANY TEST RESULTS WILL BE SENT TO YOUR REFERRING PHYSICIAN.  EXAM FINDINGS BY THE PHYSICIAN TODAY AND SIGNS OR SYMPTOMS TO REPORT TO CLINIC OR PRIMARY PHYSICIAN: Exam today per Dr. Orlie Dakin  INSTRUCTIONS GIVEN AND DISCUSSED: We will just watch your tooth for now. Do Not pull it yourself.Marland Kitchen  SPECIAL INSTRUCTIONS/FOLLOW-UP: See MD in 4 weeks Chemo as scheduled  Thank you for choosing [name] to provide your oncology and hematology care.  To afford each patient quality time with our providers, please arrive at least 15 minutes before your scheduled appointment time.  With your help, our goal is to use those 15 minutes to complete the necessary work-up to ensure our physicians have the information they need to help with your evaluation and healthcare recommendations.    Effective January 1st, 2014, we ask that you re-schedule your appointment with our physicians should you arrive 10 or more minutes late for your appointment.  We strive to give you quality time with our providers, and arriving late affects you and other patients whose appointments are after yours.    Again, thank you for choosing [facility]  Our hope is that these requests will decrease the amount of time that you wait before being seen by our physicians.       _____________________________________________________________  Should you have questions after your visit to [facility], please contact our office at [phone] between the hours of 8:30 a.m. and 5:00 p.m.  Voicemails left after 4:30 p.m. will not be returned until the following business day.  For prescription refill requests, have your pharmacy contact our office with your prescription refill request.

## 2013-05-18 ENCOUNTER — Encounter (HOSPITAL_BASED_OUTPATIENT_CLINIC_OR_DEPARTMENT_OTHER): Payer: BC Managed Care – PPO

## 2013-05-18 ENCOUNTER — Encounter: Payer: Self-pay | Admitting: Gastroenterology

## 2013-05-18 DIAGNOSIS — Z452 Encounter for adjustment and management of vascular access device: Secondary | ICD-10-CM

## 2013-05-18 DIAGNOSIS — C801 Malignant (primary) neoplasm, unspecified: Secondary | ICD-10-CM

## 2013-05-18 MED ORDER — HEPARIN SOD (PORK) LOCK FLUSH 100 UNIT/ML IV SOLN
500.0000 [IU] | Freq: Once | INTRAVENOUS | Status: AC | PRN
  Administered 2013-05-18: 500 [IU]
  Filled 2013-05-18: qty 5

## 2013-05-18 MED ORDER — SODIUM CHLORIDE 0.9 % IJ SOLN
10.0000 mL | INTRAMUSCULAR | Status: DC | PRN
  Administered 2013-05-18: 10 mL
  Filled 2013-05-18: qty 10

## 2013-05-18 MED ORDER — HEPARIN SOD (PORK) LOCK FLUSH 100 UNIT/ML IV SOLN
INTRAVENOUS | Status: AC
  Filled 2013-05-18: qty 5

## 2013-05-18 NOTE — Progress Notes (Signed)
Monica Allen presented for Portacath deaccess and flush after continuous 5 fu pump.   Proper placement of portacath confirmed by CXR. Portacath located lt  chest wall. Good blood return present. Portacath flushed with 20ml NS and 500U/88ml Heparin and needle removed intact. Procedure without incident. Patient tolerated procedure well and tolerated chemo well.

## 2013-05-18 NOTE — Progress Notes (Signed)
TCS APR 2014 ADENOCA  APR 2014 R HEMICOLECTOMY, 11/19 POS LNs  REVIEWED.

## 2013-05-24 NOTE — Progress Notes (Signed)
REVIEWED.  

## 2013-05-30 ENCOUNTER — Encounter (HOSPITAL_BASED_OUTPATIENT_CLINIC_OR_DEPARTMENT_OTHER): Payer: BC Managed Care – PPO

## 2013-05-30 VITALS — BP 145/66 | HR 74 | Temp 97.9°F | Resp 18 | Wt 220.0 lb

## 2013-05-30 DIAGNOSIS — C182 Malignant neoplasm of ascending colon: Secondary | ICD-10-CM

## 2013-05-30 DIAGNOSIS — C801 Malignant (primary) neoplasm, unspecified: Secondary | ICD-10-CM

## 2013-05-30 DIAGNOSIS — C189 Malignant neoplasm of colon, unspecified: Secondary | ICD-10-CM

## 2013-05-30 DIAGNOSIS — Z5111 Encounter for antineoplastic chemotherapy: Secondary | ICD-10-CM

## 2013-05-30 LAB — COMPREHENSIVE METABOLIC PANEL
ALT: 9 U/L (ref 0–35)
Alkaline Phosphatase: 66 U/L (ref 39–117)
Chloride: 103 mEq/L (ref 96–112)
GFR calc Af Amer: >90 mL/min (ref >90)
Glucose, Bld: 111 mg/dL — ABNORMAL HIGH (ref 70–99)
Potassium: 3.9 mEq/L (ref 3.5–5.1)
Sodium: 139 mEq/L (ref 135–145)
Total Bilirubin: 0.2 mg/dL — ABNORMAL LOW (ref 0.3–1.2)
Total Protein: 6.6 g/dL (ref 6.0–8.3)

## 2013-05-30 LAB — URINALYSIS, DIPSTICK ONLY
Glucose, UA: NEGATIVE mg/dL
Leukocytes, UA: NEGATIVE
Specific Gravity, Urine: 1.02 (ref 1.005–1.030)
pH: 6 (ref 5.0–8.0)

## 2013-05-30 LAB — CBC WITH DIFFERENTIAL/PLATELET
Eosinophils Absolute: 0.1 10*3/uL (ref 0.0–0.7)
Hemoglobin: 10.3 g/dL — ABNORMAL LOW (ref 12.0–15.0)
Lymphocytes Relative: 51 % — ABNORMAL HIGH (ref 12–46)
Lymphs Abs: 2.9 10*3/uL (ref 0.7–4.0)
MCH: 23.8 pg — ABNORMAL LOW (ref 26.0–34.0)
Monocytes Relative: 9 % (ref 3–12)
Neutro Abs: 2.2 10*3/uL (ref 1.7–7.7)
Neutrophils Relative %: 38 % — ABNORMAL LOW (ref 43–77)
Platelets: 121 10*3/uL — ABNORMAL LOW (ref 150–400)
RBC: 4.33 MIL/uL (ref 3.87–5.11)
WBC: 5.8 10*3/uL (ref 4.0–10.5)

## 2013-05-30 MED ORDER — DEXTROSE 5 % IV SOLN
Freq: Once | INTRAVENOUS | Status: AC
  Administered 2013-05-30: 13:00:00 via INTRAVENOUS

## 2013-05-30 MED ORDER — SODIUM CHLORIDE 0.9 % IV SOLN
Freq: Once | INTRAVENOUS | Status: AC
  Administered 2013-05-30: 8 mg via INTRAVENOUS
  Filled 2013-05-30: qty 4

## 2013-05-30 MED ORDER — FLUOROURACIL CHEMO INJECTION 2.5 GM/50ML
400.0000 mg/m2 | Freq: Once | INTRAVENOUS | Status: AC
  Administered 2013-05-30: 850 mg via INTRAVENOUS
  Filled 2013-05-30: qty 17

## 2013-05-30 MED ORDER — SODIUM CHLORIDE 0.9 % IV SOLN
5.0000 mg/kg | Freq: Once | INTRAVENOUS | Status: AC
  Administered 2013-05-30: 525 mg via INTRAVENOUS
  Filled 2013-05-30: qty 21

## 2013-05-30 MED ORDER — SODIUM CHLORIDE 0.9 % IV SOLN
2400.0000 mg/m2 | INTRAVENOUS | Status: DC
  Administered 2013-05-30: 5200 mg via INTRAVENOUS
  Filled 2013-05-30: qty 104

## 2013-05-30 MED ORDER — OXALIPLATIN CHEMO INJECTION 100 MG/20ML
85.0000 mg/m2 | Freq: Once | INTRAVENOUS | Status: AC
  Administered 2013-05-30: 185 mg via INTRAVENOUS
  Filled 2013-05-30: qty 37

## 2013-05-30 MED ORDER — LEUCOVORIN CALCIUM INJECTION 100 MG
20.0000 mg/m2 | Freq: Once | INTRAMUSCULAR | Status: AC
  Administered 2013-05-30: 44 mg via INTRAVENOUS
  Filled 2013-05-30: qty 2.2

## 2013-05-30 MED ORDER — SODIUM CHLORIDE 0.9 % IV SOLN
Freq: Once | INTRAVENOUS | Status: AC
  Administered 2013-05-30: 11:00:00 via INTRAVENOUS

## 2013-05-31 LAB — CEA: CEA: 2.8 ng/mL (ref 0.0–5.0)

## 2013-06-01 ENCOUNTER — Encounter (HOSPITAL_BASED_OUTPATIENT_CLINIC_OR_DEPARTMENT_OTHER): Payer: BC Managed Care – PPO

## 2013-06-01 DIAGNOSIS — C801 Malignant (primary) neoplasm, unspecified: Secondary | ICD-10-CM

## 2013-06-01 DIAGNOSIS — Z452 Encounter for adjustment and management of vascular access device: Secondary | ICD-10-CM

## 2013-06-01 MED ORDER — HEPARIN SOD (PORK) LOCK FLUSH 100 UNIT/ML IV SOLN
500.0000 [IU] | Freq: Once | INTRAVENOUS | Status: AC | PRN
  Administered 2013-06-01: 500 [IU]
  Filled 2013-06-01: qty 5

## 2013-06-01 MED ORDER — SODIUM CHLORIDE 0.9 % IJ SOLN
10.0000 mL | INTRAMUSCULAR | Status: DC | PRN
  Administered 2013-06-01: 10 mL
  Filled 2013-06-01: qty 10

## 2013-06-01 MED ORDER — HEPARIN SOD (PORK) LOCK FLUSH 100 UNIT/ML IV SOLN
INTRAVENOUS | Status: AC
  Filled 2013-06-01: qty 5

## 2013-06-01 NOTE — Progress Notes (Signed)
D/C continuous infusion pump. Flushed port per protocol. Patient denies complaints with chemotherapy.

## 2013-06-13 ENCOUNTER — Encounter (HOSPITAL_BASED_OUTPATIENT_CLINIC_OR_DEPARTMENT_OTHER): Payer: BC Managed Care – PPO

## 2013-06-13 ENCOUNTER — Encounter (HOSPITAL_COMMUNITY): Payer: BC Managed Care – PPO | Attending: Oncology

## 2013-06-13 ENCOUNTER — Encounter (HOSPITAL_COMMUNITY): Payer: BC Managed Care – PPO

## 2013-06-13 ENCOUNTER — Encounter (HOSPITAL_COMMUNITY): Payer: Self-pay

## 2013-06-13 DIAGNOSIS — C189 Malignant neoplasm of colon, unspecified: Secondary | ICD-10-CM | POA: Insufficient documentation

## 2013-06-13 DIAGNOSIS — C801 Malignant (primary) neoplasm, unspecified: Secondary | ICD-10-CM

## 2013-06-13 DIAGNOSIS — C778 Secondary and unspecified malignant neoplasm of lymph nodes of multiple regions: Secondary | ICD-10-CM

## 2013-06-13 DIAGNOSIS — C182 Malignant neoplasm of ascending colon: Secondary | ICD-10-CM

## 2013-06-13 DIAGNOSIS — Z5111 Encounter for antineoplastic chemotherapy: Secondary | ICD-10-CM

## 2013-06-13 DIAGNOSIS — Z5112 Encounter for antineoplastic immunotherapy: Secondary | ICD-10-CM

## 2013-06-13 DIAGNOSIS — C7960 Secondary malignant neoplasm of unspecified ovary: Secondary | ICD-10-CM

## 2013-06-13 DIAGNOSIS — D509 Iron deficiency anemia, unspecified: Secondary | ICD-10-CM

## 2013-06-13 LAB — CBC WITH DIFFERENTIAL/PLATELET
Basophils Absolute: 0 10*3/uL (ref 0.0–0.1)
Eosinophils Absolute: 0.1 10*3/uL (ref 0.0–0.7)
Eosinophils Relative: 1 % (ref 0–5)
HCT: 34.2 % — ABNORMAL LOW (ref 36.0–46.0)
Hemoglobin: 10.8 g/dL — ABNORMAL LOW (ref 12.0–15.0)
Lymphocytes Relative: 47 % — ABNORMAL HIGH (ref 12–46)
MCHC: 31.6 g/dL (ref 30.0–36.0)
MCV: 78.6 fL (ref 78.0–100.0)
Monocytes Absolute: 0.7 10*3/uL (ref 0.1–1.0)
Platelets: 141 10*3/uL — ABNORMAL LOW (ref 150–400)

## 2013-06-13 LAB — URINALYSIS, DIPSTICK ONLY
Glucose, UA: NEGATIVE mg/dL
Hgb urine dipstick: NEGATIVE
Leukocytes, UA: NEGATIVE
pH: 5.5 (ref 5.0–8.0)

## 2013-06-13 LAB — COMPREHENSIVE METABOLIC PANEL
AST: 15 U/L (ref 0–37)
CO2: 24 mEq/L (ref 19–32)
Calcium: 8.9 mg/dL (ref 8.4–10.5)
Creatinine, Ser: 0.55 mg/dL (ref 0.50–1.10)
GFR calc Af Amer: >90 mL/min (ref >90)
GFR calc non Af Amer: >90 mL/min (ref >90)
Glucose, Bld: 166 mg/dL — ABNORMAL HIGH (ref 70–99)
Total Protein: 6.4 g/dL (ref 6.0–8.3)

## 2013-06-13 MED ORDER — OXALIPLATIN CHEMO INJECTION 100 MG/20ML
85.0000 mg/m2 | Freq: Once | INTRAVENOUS | Status: AC
  Administered 2013-06-13: 185 mg via INTRAVENOUS
  Filled 2013-06-13: qty 37

## 2013-06-13 MED ORDER — SODIUM CHLORIDE 0.9 % IV SOLN
Freq: Once | INTRAVENOUS | Status: AC
  Administered 2013-06-13: 11:00:00 via INTRAVENOUS

## 2013-06-13 MED ORDER — DEXTROSE 5 % IV SOLN
Freq: Once | INTRAVENOUS | Status: AC
  Administered 2013-06-13: 12:00:00 via INTRAVENOUS

## 2013-06-13 MED ORDER — SODIUM CHLORIDE 0.9 % IV SOLN
5.0000 mg/kg | Freq: Once | INTRAVENOUS | Status: AC
  Administered 2013-06-13: 525 mg via INTRAVENOUS
  Filled 2013-06-13: qty 21

## 2013-06-13 MED ORDER — SODIUM CHLORIDE 0.9 % IV SOLN
Freq: Once | INTRAVENOUS | Status: AC
  Administered 2013-06-13: 8 mg via INTRAVENOUS
  Filled 2013-06-13: qty 4

## 2013-06-13 MED ORDER — FLUOROURACIL CHEMO INJECTION 2.5 GM/50ML
400.0000 mg/m2 | Freq: Once | INTRAVENOUS | Status: AC
  Administered 2013-06-13: 850 mg via INTRAVENOUS
  Filled 2013-06-13: qty 17

## 2013-06-13 MED ORDER — SODIUM CHLORIDE 0.9 % IV SOLN
2400.0000 mg/m2 | INTRAVENOUS | Status: DC
  Administered 2013-06-13: 5200 mg via INTRAVENOUS
  Filled 2013-06-13: qty 104

## 2013-06-13 MED ORDER — LEUCOVORIN CALCIUM INJECTION 100 MG
20.0000 mg/m2 | Freq: Once | INTRAMUSCULAR | Status: AC
  Administered 2013-06-13: 44 mg via INTRAVENOUS
  Filled 2013-06-13: qty 2.2

## 2013-06-13 NOTE — Patient Instructions (Addendum)
[facility] Discharge Instructions  RECOMMENDATIONS MADE BY THE CONSULTANT AND ANY TEST RESULTS WILL BE SENT TO YOUR REFERRING PHYSICIAN.  EXAM FINDINGS BY THE PHYSICIAN TODAY AND SIGNS OR SYMPTOMS TO REPORT TO CLINIC OR PRIMARY PHYSICIAN: Exam findings as discussed by Dr. Orlie Dakin.  SPECIAL INSTRUCTIONS/FOLLOW-UP: 1.  We are scheduling you for a CT before your next cycle of chemo.  Contrast instructions are included in this discharge instructions.  Please arrive in our radiology department by 12:45pm on 06/20/13 for your CT exam. 2.  You will be seen in the office again once your CT has been done.  Thank you for choosing [name] to provide your oncology and hematology care.  To afford each patient quality time with our providers, please arrive at least 15 minutes before your scheduled appointment time.  With your help, our goal is to use those 15 minutes to complete the necessary work-up to ensure our physicians have the information they need to help with your evaluation and healthcare recommendations.    Effective January 1st, 2014, we ask that you re-schedule your appointment with our physicians should you arrive 10 or more minutes late for your appointment.  We strive to give you quality time with our providers, and arriving late affects you and other patients whose appointments are after yours.    Again, thank you for choosing [facility].  Our hope is that these requests will decrease the amount of time that you wait before being seen by our physicians.       _____________________________________________________________  Should you have questions after your visit to [facility], please contact our office at [phone] between the hours of 8:30 a.m. and 5:00 p.m.  Voicemails left after 4:30 p.m. will not be returned until the following business day.  For prescription refill requests, have your pharmacy contact our office with  your prescription refill request.    [facility] Radiology Department  Instructions for CT Abdomen/Pelvis  Monica Allen, your exam is scheduled for Tuesday at 1:00  in the afternoon.  **WARNING** IF YOU ARE ALLERGIC TO IODINE/X-RAY DYE, PLEASE NOTIFY us IMMEDIATELY.  YOU MAY NEED ADDITIONAL PRE-MEDICATIONS THE DAY PRIOR TO YOUR EXAM.   Please follow these instructions on the day of your exam.   Do not eat or drink anything after 1100.  (2 hours prior to the exam time)   You will be given two bottles of ReadiCat oral contrast solution to drink.  The solution may taste better if refrigerated, but do not add ice.  SHAKE WELL BEFORE DRINKING   Drink one bottle of ReadiCat (barium solution) at 1100.  (2 hours prior to your exam)   Drink one bottle of ReadiCat (barium solution) at 1200.  (1 hour prior to your exam)   You may take any medications as prescribed, with a small amount of water, if necessary.   Please arrive 30 minutes prior to appointment time to register.   Come in and report to Short Stay to register.   If registering in any area other than Radiology, notify the Radiology front office upon your arrival in the Radiology Department and they will notify the CT Department.  The purpose of you drinking the oral contrast solution (ReadiCat) is to aid in the visualization of your intestinal tract.  The contrast solution may cause some diarrhea. Before your exam is started, you will be given a small amount of water to drink.  Depending on your individual set of symptoms, you may also receive an intravenous injection of x-ray contrast/iodine.  Plan on being in Radiology for 30-60 minutes or longer, depending on the type of exam you are having performed.  If you have any questions regarding your procedure, you may call the CT Department at [phone].

## 2013-06-13 NOTE — Progress Notes (Signed)
Chief complaint: Stage IV colon cancer.  History of present illness:   Patient returns to clinic today for routine followup and consideration of her next infusion of FOLFOX plus Avastin. She has some mild cold neuropathy, but is otherwise tolerating her treatments well. Her only other complaint today is of increased weight gain. She otherwise feels well. She does not complain of her loose tooth today. She denies any neurologic complaints. She has a good appetite and denies weight loss. She has no recent fevers. He denies any chest pain or shortness of breath. She has no nausea, vomiting, constipation, or diarrhea. She denies any melena or hematochezia. She has no urinary complaints. Patient otherwise feels well and offers no further specific complaints.   Active Ambulatory Problems    Diagnosis Date Noted  . 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 DIABETES MELLITUS, UNCONTROLLED, WITH RENAL COMPLICATIONS 05/01/2009  . ANXIETY 10/03/2007  . DEPRESSION 10/03/2007  . HYPERTENSION 10/03/2007  . CARDIOMYOPATHY, DILATED 10/03/2007  . CONGESTIVE HEART FAILURE 10/03/2007  . SINUSITIS, CHRONIC 10/03/2007  . ALLERGIC RHINITIS 10/03/2007  . GERD 10/03/2007  . ONYCHIA AND PARONYCHIA OF TOE 05/29/2009  . LOW BACK PAIN 10/03/2007  . Diabetes mellitus 12/16/2012  . Invasive adenocarcinoma of colon 03/08/2013   Resolved Ambulatory Problems    Diagnosis Date Noted  . Ovarian cystic mass 11/23/2012   Past Medical History  Diagnosis Date  . Diabetes mellitus   . Hypertension   . Enlarged heart   . Goiter   . Nonischemic cardiomyopathy   . Anemia   . Cancer    Review of systems: As per history of present illness, otherwise a 10 system review is negative.  There were no vitals filed for this visit.  General: Well-developed, well-nourished, no acute distress.  Mental status: Normal affect.  Eyes: Anicteric sclera.  Respiratory: Clear to auscultation bilaterally.  Cardiovascular: Regular rate and rhythm, no  rubs, murmurs, or gallops.  Gastrointestinal: Soft, nontender, nondistended, normoactive bowel sounds.  Musculoskeletal: No edema.  Skin: No rashes or petechiae noted.  Neurological: Alert, answering all questions appropriately. Cranial nerves grossly intact.   CBC    Component Value Date/Time   WBC 6.3 06/13/2013 0924   RBC 4.35 06/13/2013 0924   HGB 10.8* 06/13/2013 0924   HCT 34.2* 06/13/2013 0924   PLT 141* 06/13/2013 0924   MCV 78.6 06/13/2013 0924   MCH 24.8* 06/13/2013 0924   MCHC 31.6 06/13/2013 0924   RDW 26.7* 05/30/2013 0931   LYMPHSABS 2.9 06/13/2013 0924   MONOABS 0.7 06/13/2013 0924   EOSABS 0.1 06/13/2013 0924   BASOSABS 0.0 06/13/2013 0924     BMET    Component Value Date/Time   NA 137 06/13/2013 0924   K 3.9 06/13/2013 0924   CL 104 06/13/2013 0924   CO2 24 06/13/2013 0924   GLUCOSE 166* 06/13/2013 0924   BUN 11 06/13/2013 0924   CREATININE 0.55 06/13/2013 0924   CALCIUM 8.9 06/13/2013 0924   GFRNONAA >90 06/13/2013 0924   GFRAA >90 06/13/2013 0924    Assessment and plan:  1. Invasive adenocarcinoma of colon (T4a, N2b, M1a) with invasion through the muscularis propria and into pericolonic fat involving the serosa, angiolymphatic invasion noted, 11/19 positive lymph nodes with extracapsular extension, KRAS WILDTYPE. On systemic chemotherapy beginning on 04/04/2013 with FOLFOX + Avastin. Laboratory work is adequate to proceed with her 6th infusion today. Plan to reimage after completion of her sixth infusion and this has been ordered. Continue to monitor CEA every 4 weeks. Return to  clinic in 2 weeks for laboratory work and her next infusion of FOLFOX plus Avastin and then in 4 weeks for further evaluation. Patient understands he can return to clinic at any time if she has any questions, concerns, or complaints.  2. Microcytic anemia: Improved with 1020 mg Feraheme given on 04/20/2013. Patient has discontinued her oral iron supplementation. Monitor.  3. Loose tooth: No intervention needed at this time,  we'll consider a dental referral if the tooth becomes painful or abscessed.

## 2013-06-15 ENCOUNTER — Encounter (HOSPITAL_BASED_OUTPATIENT_CLINIC_OR_DEPARTMENT_OTHER): Payer: BC Managed Care – PPO

## 2013-06-15 ENCOUNTER — Encounter (HOSPITAL_COMMUNITY): Payer: BC Managed Care – PPO

## 2013-06-15 DIAGNOSIS — C801 Malignant (primary) neoplasm, unspecified: Secondary | ICD-10-CM

## 2013-06-15 DIAGNOSIS — Z452 Encounter for adjustment and management of vascular access device: Secondary | ICD-10-CM

## 2013-06-15 DIAGNOSIS — C182 Malignant neoplasm of ascending colon: Secondary | ICD-10-CM

## 2013-06-15 MED ORDER — HEPARIN SOD (PORK) LOCK FLUSH 100 UNIT/ML IV SOLN
INTRAVENOUS | Status: AC
  Filled 2013-06-15: qty 5

## 2013-06-15 MED ORDER — HEPARIN SOD (PORK) LOCK FLUSH 100 UNIT/ML IV SOLN
500.0000 [IU] | Freq: Once | INTRAVENOUS | Status: AC | PRN
  Administered 2013-06-15: 500 [IU]
  Filled 2013-06-15: qty 5

## 2013-06-15 MED ORDER — SODIUM CHLORIDE 0.9 % IJ SOLN
10.0000 mL | INTRAMUSCULAR | Status: DC | PRN
  Administered 2013-06-15: 10 mL
  Filled 2013-06-15: qty 10

## 2013-06-15 NOTE — Progress Notes (Signed)
.  Monica Allen presents today after 46 hr continuous infusion of 5Fu. Tolerated well. Portacath located lt chest wall deaccessed  Good blood return present. Portacath flushed with 20ml NS and 500U/65ml Heparin and needle removed intact. Procedure without incident. Patient tolerated procedure well.

## 2013-06-20 ENCOUNTER — Other Ambulatory Visit (HOSPITAL_COMMUNITY): Payer: BC Managed Care – PPO

## 2013-06-20 ENCOUNTER — Ambulatory Visit (HOSPITAL_COMMUNITY): Payer: BC Managed Care – PPO

## 2013-06-20 ENCOUNTER — Ambulatory Visit (HOSPITAL_COMMUNITY)
Admission: RE | Admit: 2013-06-20 | Discharge: 2013-06-20 | Disposition: A | Discharge status: Home / Self Care | Payer: BC Managed Care – PPO | Source: Ambulatory Visit | Attending: Oncology | Admitting: Oncology

## 2013-06-20 DIAGNOSIS — C189 Malignant neoplasm of colon, unspecified: Secondary | ICD-10-CM | POA: Insufficient documentation

## 2013-06-20 DIAGNOSIS — E042 Nontoxic multinodular goiter: Secondary | ICD-10-CM | POA: Insufficient documentation

## 2013-06-20 DIAGNOSIS — Z8543 Personal history of malignant neoplasm of ovary: Secondary | ICD-10-CM | POA: Insufficient documentation

## 2013-06-20 MED ORDER — IOHEXOL 300 MG/ML  SOLN
100.0000 mL | Freq: Once | INTRAMUSCULAR | Status: AC | PRN
  Administered 2013-06-20: 100 mL via INTRAVENOUS

## 2013-06-22 ENCOUNTER — Encounter (HOSPITAL_BASED_OUTPATIENT_CLINIC_OR_DEPARTMENT_OTHER): Payer: BC Managed Care – PPO

## 2013-06-22 ENCOUNTER — Encounter (HOSPITAL_COMMUNITY): Payer: Self-pay

## 2013-06-22 VITALS — BP 172/77 | HR 62 | Temp 98.3°F | Resp 20 | Wt 228.5 lb

## 2013-06-22 DIAGNOSIS — C189 Malignant neoplasm of colon, unspecified: Secondary | ICD-10-CM

## 2013-06-22 DIAGNOSIS — C182 Malignant neoplasm of ascending colon: Secondary | ICD-10-CM

## 2013-06-22 DIAGNOSIS — I10 Essential (primary) hypertension: Secondary | ICD-10-CM

## 2013-06-22 DIAGNOSIS — R197 Diarrhea, unspecified: Secondary | ICD-10-CM

## 2013-06-22 LAB — BASIC METABOLIC PANEL
BUN: 12 mg/dL (ref 6–23)
GFR calc Af Amer: >90 mL/min (ref >90)
Glucose, Bld: 159 mg/dL — ABNORMAL HIGH (ref 70–99)

## 2013-06-22 NOTE — Progress Notes (Signed)
[facility] OFFICE PROGRESS NOTE  PCP: [name], PA-C [address] [place] [address]  DIAGNOSIS: Invasive adenocarcinoma of colon - Plan: Basic metabolic panel, Basic metabolic panel  CURRENT THERAPY:  INTERVAL HISTORY: Monica Allen 57 y.o. female returns for followup, to review recent CT scan. Patient was seen recently, has continued on treatment with FOLFOX and Avastin. Last treatment was on 06/13/13. She restaging CT scan on 8/12 . See also impression and plan. Report shows no new lesions and some decrease in intra-abdominal lymphadenopathy. Patient reports GI symptoms and diarrhea after the last treatment. He had a maximum of 6 stools loose every 24 hours for about 2 days beginning 3 days after this treatment, on Tuesday she had a stools after midnight and in the early morning hours. No abdominal pain mild abdominal cramps. No skin changes. No fever chills or sweats. No significant neuropathy some tingling and numbness of the fingers not progressive no pain in the feet no weakness. She reports her by mouth intake was significantly down but has pickup in the last day.  MEDICAL HISTORY: Past Medical History  Diagnosis Date  . Diabetes mellitus   . Hypertension   . Enlarged heart   . Goiter   . Nonischemic cardiomyopathy   . Anemia   . Cancer     right colon- ovarian  . Invasive adenocarcinoma of colon 03/08/2013    11/19 LNs POS, THRU TO ADIPOSE TISSUE    SURGICAL HISTORY:  Past Surgical History  Procedure Laterality Date  . Wisdom tooth extraction    . Laparotomy  12/13/2012    Procedure: EXPLORATORY LAPAROTOMY;  Surgeon: Jeannette Corpus, MD;  [facility]: [facility];  Service: Gynecology;  Laterality: N/A;  . Abdominal hysterectomy  12/13/2012    Procedure: HYSTERECTOMY ABDOMINAL;  Surgeon: Jeannette Corpus, MD;  [facility]: [facility];  Service: Gynecology;  Laterality: N/A;  . Salpingoophorectomy  12/13/2012    Procedure: SALPINGO  OOPHORECTOMY;  Surgeon: Jeannette Corpus, MD;  [facility]: [facility];  Service: Gynecology;  Laterality: Bilateral;  . Omentectomy  12/13/2012    Procedure: OMENTECTOMY;  Surgeon: Jeannette Corpus, MD;  [facility];  Service: Gynecology;  Laterality: N/A;  . Colonoscopy N/A 02/14/2013    Procedure: COLONOSCOPY;  Surgeon: West Bali, MD;  [facility];  Service: Endoscopy;  Laterality: N/A;  2:00 PM-moved to 1115 Kim notified pt  . Partial colectomy N/A 03/01/2013    Procedure: PARTIAL COLECTOMY;  Surgeon: Dalia Heading, MD;  Location: [facility];  Service: General;  Laterality: N/A;  . Portacath placement Left 03/01/2013    Procedure: INSERTION PORT-A-CATH;  Surgeon: Dalia Heading, MD;  Location: [facility];  Service: General;  Laterality: Left;      PROBLEM LIST : has THYROMEGALY; DIABETES MELLITUS, UNCONTROLLED, WITH RENAL COMPLICATIONS; ANXIETY; DEPRESSION; HYPERTENSION; CARDIOMYOPATHY, DILATED; CONGESTIVE HEART FAILURE; SINUSITIS, CHRONIC; ALLERGIC RHINITIS; GERD; ONYCHIA AND PARONYCHIA OF TOE; LOW BACK PAIN; Diabetes mellitus; and Invasive adenocarcinoma of colon on her problem list.    ALLERGIES:  is allergic to actos and ibuprofen.  MEDICATIONS: Current outpatient prescriptions:aspirin EC 81 MG tablet, Take 81 mg by mouth every morning. , Disp: , Rfl: ;  carvedilol (COREG CR) 80 MG 24 hr capsule, Take 80 mg by mouth daily with breakfast. , Disp: , Rfl: ;  dexamethasone (DECADRON) 4 MG tablet, Starting the day after chemo, take 2 tablets in the am for 2 days. Then Stop. Take with food., Disp: 24  tablet, Rfl: 1 ferrous sulfate 325 (65 FE) MG tablet, Take 325 mg by mouth daily with breakfast., Disp: , Rfl: ;  furosemide (LASIX) 80 MG tablet, Take 80 mg by mouth daily with breakfast. , Disp: , Rfl: ;  glipiZIDE (GLUCOTROL) 10 MG tablet, Take 10 mg by mouth daily. , Disp: , Rfl: ;  lidocaine-prilocaine (EMLA) cream, Apply a quarter size amount to port site 1 hour prior to chemo. Do  not rub in. Cover with plastic., Disp: 30 g, Rfl: 3 Linagliptin-Metformin HCl (JENTADUETO) 2.03-999 MG TABS, Take 1 tablet by mouth daily. , Disp: , Rfl: ;  LORazepam (ATIVAN) 1 MG tablet, May take 1 tab every 4 hours IF needed for nausea/vomiting., Disp: 30 tablet, Rfl: 0;  ondansetron (ZOFRAN) 8 MG tablet, Starting the day after chemo, take 1 tab in the am and 1 tab in the pm for 2 days. Then may take 1 tab two times a day IF needed for nausea/vomiting., Disp: 30 tablet, Rfl: 1 potassium chloride SA (K-DUR,KLOR-CON) 20 MEQ tablet, Take 20 mEq by mouth daily. , Disp: , Rfl: ;  ramipril (ALTACE) 10 MG tablet, Take 10 mg by mouth daily with breakfast. , Disp: , Rfl: ;  valsartan (DIOVAN) 80 MG tablet, Take 80 mg by mouth daily., Disp: , Rfl:     REVIEW OF SYSTEMS: No headache no dizziness no cough no shortness of breath no chills no sweats  PHYSICAL EXAMINATION: ECOG PERFORMANCE STATUS: 1 - Symptomatic but completely ambulatory  Filed Vitals:   06/22/13 1216  BP: 172/77  Pulse: 62  Temp:   Resp:     GENERAL: No distress, well nourished.  SKIN:  No rashes or bruising  HEAD: Normocephalic, No trauma EYES: Sclera and conjuntiva clear  ENT: No thrush LYMPH: No palpable lymphadenopathy neck, supraclavicular submandibular, did not examine the axilla BREAST: Did not examine LUNGS: Clear to auscultation, no crackles or wheezes or rhonchi HEART: Regular rate & rhythm,   ABDOMEN: Abdomen soft, non-tender, , no masses or organomegaly   EXTREMITIES: No lower ext edema NEURO: Alert & oriented, no gross focal weakness. PSYCH  Cooperative, mood/affect normal   LABORATORY DATA: Results for orders placed in visit on 06/22/13 (from the past 48 hour(s))  BASIC METABOLIC PANEL     Status: Abnormal   Collection Time    06/22/13 12:09 PM      Result Value Range   Sodium 139  135 - 145 mEq/L   Potassium 3.8  3.5 - 5.1 mEq/L   Chloride 105  96 - 112 mEq/L   CO2 25  19 - 32 mEq/L   Glucose, Bld 159  (*) 70 - 99 mg/dL   BUN 12  6 - 23 mg/dL   Creatinine, Ser 8.65  0.50 - 1.10 mg/dL   Calcium 9.1  8.4 - 78.4 mg/dL   GFR calc non Af Amer >90  >90 mL/min   GFR calc Af Amer >90  >90 mL/min   Comment: (NOTE)     The eGFR has been calculated using the CKD EPI equation.     This calculation has not been validated in all clinical situations.     eGFR's persistently <90 mL/min signify possible Chronic Kidney     Disease.      RADIOGRAPHIC STUDIES: No results found. C. CT scan result from 06/20/13   ASSESSMENT: Clinically stable. Had to midcycle diarrhea that reached a 6 stools per 24-hour period plus after midnight and overnight stools and cramps but no fever  no mucositis or stomatitis and has not had any increased neuropathy. . CT scan shows some improvement in lymphadenopathy and no new lesions overall compatible with response to therapy. Patient reports significant decreased by mouth intake and since she had a dye study I elected to repeat the metabolic panel which was okay. Also a blood pressure was initially elevated to anxiety, was coming down to level as above. Patient reports that she had not taken her blood pressure medicines today   PLAN: Discuss results with patient. Patient has successfully increased her by mouth fluid intake and should continue to do so. She'll take her blood pressure medicines on schedule. She can tomorrow and resume her metformin. She will keep her scheduled appointment for chemotherapy next week.  I RECOMMEND FU DOSE REDUCTION NEXT CYCLE DUE TO GI TOXICITY/DIARRHEA    All questions were answered. The patient knows to call the clinic with any problems, questions or concerns. We can certainly see the patient much sooner if necessary.     Marin Roberts, MD 06/22/2013 9:11 PM

## 2013-06-22 NOTE — Patient Instructions (Addendum)
[facility] Discharge Instructions  RECOMMENDATIONS MADE BY THE CONSULTANT AND ANY TEST RESULTS WILL BE SENT TO YOUR REFERRING PHYSICIAN.  EXAM FINDINGS BY THE PHYSICIAN TODAY AND SIGNS OR SYMPTOMS TO REPORT TO CLINIC OR PRIMARY PHYSICIAN: Exam and discussion by Dr. Lorre Nick.  Will recheck your chemistries today and will let you know if there are any problems.  MEDICATIONS PRESCRIBED:  none  INSTRUCTIONS GIVEN AND DISCUSSED: Report fevers, chills, uncontrolled nausea, vomiting or diarrhea.  SPECIAL INSTRUCTIONS/FOLLOW-UP: Follow-up next week for MD visit and chemotherapy.  Thank you for choosing [name] to provide your oncology and hematology care.  To afford each patient quality time with our providers, please arrive at least 15 minutes before your scheduled appointment time.  With your help, our goal is to use those 15 minutes to complete the necessary work-up to ensure our physicians have the information they need to help with your evaluation and healthcare recommendations.    Effective January 1st, 2014, we ask that you re-schedule your appointment with our physicians should you arrive 10 or more minutes late for your appointment.  We strive to give you quality time with our providers, and arriving late affects you and other patients whose appointments are after yours.    Again, thank you for choosing [facility].  Our hope is that these requests will decrease the amount of time that you wait before being seen by our physicians.       _____________________________________________________________  Should you have questions after your visit to [facility], please contact our office at [phone] between the hours of 8:30 a.m. and 5:00 p.m.  Voicemails left after 4:30 p.m. will not be returned until the following business day.  For prescription refill requests, have your pharmacy contact our office with your prescription  refill request.

## 2013-06-27 ENCOUNTER — Encounter (HOSPITAL_COMMUNITY): Payer: Self-pay

## 2013-06-27 ENCOUNTER — Encounter (HOSPITAL_BASED_OUTPATIENT_CLINIC_OR_DEPARTMENT_OTHER): Payer: BC Managed Care – PPO

## 2013-06-27 VITALS — BP 161/84 | HR 83 | Temp 97.7°F | Resp 18 | Wt 228.4 lb

## 2013-06-27 DIAGNOSIS — Z5112 Encounter for antineoplastic immunotherapy: Secondary | ICD-10-CM

## 2013-06-27 DIAGNOSIS — R040 Epistaxis: Secondary | ICD-10-CM

## 2013-06-27 DIAGNOSIS — C778 Secondary and unspecified malignant neoplasm of lymph nodes of multiple regions: Secondary | ICD-10-CM

## 2013-06-27 DIAGNOSIS — C801 Malignant (primary) neoplasm, unspecified: Secondary | ICD-10-CM

## 2013-06-27 DIAGNOSIS — C7960 Secondary malignant neoplasm of unspecified ovary: Secondary | ICD-10-CM

## 2013-06-27 DIAGNOSIS — C182 Malignant neoplasm of ascending colon: Secondary | ICD-10-CM

## 2013-06-27 DIAGNOSIS — C189 Malignant neoplasm of colon, unspecified: Secondary | ICD-10-CM

## 2013-06-27 LAB — COMPREHENSIVE METABOLIC PANEL
Alkaline Phosphatase: 63 U/L (ref 39–117)
BUN: 13 mg/dL (ref 6–23)
CO2: 25 mEq/L (ref 19–32)
Chloride: 106 mEq/L (ref 96–112)
Creatinine, Ser: 0.51 mg/dL (ref 0.50–1.10)
GFR calc Af Amer: >90 mL/min (ref >90)
GFR calc non Af Amer: >90 mL/min (ref >90)
Glucose, Bld: 200 mg/dL — ABNORMAL HIGH (ref 70–99)
Potassium: 4.1 mEq/L (ref 3.5–5.1)
Total Bilirubin: 0.2 mg/dL — ABNORMAL LOW (ref 0.3–1.2)

## 2013-06-27 LAB — CBC WITH DIFFERENTIAL/PLATELET
Basophils Relative: 0 % (ref 0–1)
HCT: 34 % — ABNORMAL LOW (ref 36.0–46.0)
Hemoglobin: 11.1 g/dL — ABNORMAL LOW (ref 12.0–15.0)
Lymphocytes Relative: 44 % (ref 12–46)
Lymphs Abs: 2.6 10*3/uL (ref 0.7–4.0)
MCHC: 32.6 g/dL (ref 30.0–36.0)
Monocytes Absolute: 0.9 10*3/uL (ref 0.1–1.0)
Monocytes Relative: 14 % — ABNORMAL HIGH (ref 3–12)
Neutro Abs: 2.4 10*3/uL (ref 1.7–7.7)
Neutrophils Relative %: 41 % — ABNORMAL LOW (ref 43–77)
RBC: 4.19 MIL/uL (ref 3.87–5.11)

## 2013-06-27 LAB — URINALYSIS, DIPSTICK ONLY
Hgb urine dipstick: NEGATIVE
Nitrite: NEGATIVE
Protein, ur: NEGATIVE mg/dL
Specific Gravity, Urine: >1.03 — ABNORMAL HIGH (ref 1.005–1.030)
Urobilinogen, UA: 0.2 mg/dL (ref 0.0–1.0)

## 2013-06-27 MED ORDER — SODIUM CHLORIDE 0.9 % IV SOLN
5.0000 mg/kg | Freq: Once | INTRAVENOUS | Status: AC
  Administered 2013-06-27: 525 mg via INTRAVENOUS
  Filled 2013-06-27: qty 21

## 2013-06-27 MED ORDER — DEXTROSE 5 % IV SOLN
Freq: Once | INTRAVENOUS | Status: AC
  Administered 2013-06-27: 11:00:00 via INTRAVENOUS

## 2013-06-27 MED ORDER — SODIUM CHLORIDE 0.9 % IV SOLN
Freq: Once | INTRAVENOUS | Status: AC
  Administered 2013-06-27: 8 mg via INTRAVENOUS
  Filled 2013-06-27: qty 4

## 2013-06-27 MED ORDER — OXALIPLATIN CHEMO INJECTION 100 MG/20ML
85.0000 mg/m2 | Freq: Once | INTRAVENOUS | Status: AC
  Administered 2013-06-27: 185 mg via INTRAVENOUS
  Filled 2013-06-27: qty 37

## 2013-06-27 MED ORDER — SODIUM CHLORIDE 0.9 % IV SOLN
Freq: Once | INTRAVENOUS | Status: AC
  Administered 2013-06-27: 10:00:00 via INTRAVENOUS

## 2013-06-27 MED ORDER — SODIUM CHLORIDE 0.9 % IV SOLN
1920.0000 mg/m2 | INTRAVENOUS | Status: DC
  Administered 2013-06-27: 4150 mg via INTRAVENOUS
  Filled 2013-06-27: qty 83

## 2013-06-27 MED ORDER — LEUCOVORIN CALCIUM INJECTION 100 MG
20.0000 mg/m2 | Freq: Once | INTRAMUSCULAR | Status: AC
  Administered 2013-06-27: 44 mg via INTRAVENOUS
  Filled 2013-06-27: qty 2.2

## 2013-06-27 MED ORDER — FLUOROURACIL CHEMO INJECTION 2.5 GM/50ML
320.0000 mg/m2 | Freq: Once | INTRAVENOUS | Status: AC
  Administered 2013-06-27: 700 mg via INTRAVENOUS
  Filled 2013-06-27: qty 14

## 2013-06-27 NOTE — Progress Notes (Signed)
[facility] Telephone:(336) [phone]   Fax:([phone]  OFFICE PROGRESS NOTE  [name], PA-C [address], [place] [address]  DIAGNOSIS: stage IV adenocarcinoma of the colon.   INTERVAL HISTORY:   According to Dr. 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 Loree Fee on 12/13/2012. At that time he did TAH and BSO with lysis of adhesions, partial omentectomy, and resection of pelvic peritoneal implant. At the completion of the surgical procedure there is no evidence of residual disease. Her CEA is within the normal range. Her cancer however did not mark as ovarian in origin and a subsequent colonoscopy revealed a right ascending colon cancer. This was discovered by Dr. Raj Janus on 02/14/2013. It was near circumferential. She will need to have palliative resection.  Monica Allen 57 y.o. female returns to the clinic today for scheduled clinic follow up  and chemotherapy visit. She began palliative intent chemotherapy with FOLFOX/AVASTIN and 04/04/2013 and so far she has had about the 6 cycles. Today she scheduled for cycle #7.  She is accompanied by her husband and she tells me that she has no new problems except for mild  epistaxis which she noticed it this morning but did admit to taking her nose . She denies any profuse bleeding.  She states that she is eating well and denies nausea or vomiting . She also denies fever or chills.  She had a protracted course of diarrhea after last cycle of chemotherapy,starting about 3 days after chemotherapy and lasting  for about 4 days.  She denies any pain or mucositis.  She did admit to mild numbness in the fingers but no severe degree interferes with her use of her hands or feet. No reported pharyngolaryngeal cold dysesthesia.   MEDICATIONS: Reviewed.    REVIEW OF SYSTEMS: 14 point review of system is as in the history  above otherwise negative.    PHYSICAL EXAMINATION:  Last menstrual period 10/07/2012. GENERAL: No acute distress.obese SKIN:  No rashes or significant lesions . No ecchymosis or petechial rash. HEAD: Normocephalic, No masses, lesions, tenderness or abnormalities  EYES: Conjunctiva are pink and non-injected and no jaundice ENT: External ears normal ,lips, buccal mucosa, and tongue normal and mucous membranes are moist . No evidence of thrush. LYMPH: No palpable lymphadenopathy, in the neck, supraclavicular areas or axilla. LUNGS: Clear to auscultation , no crackles or wheezes HEART: regular rate & rhythm, no murmurs, no gallops, S1 normal and S2 normal and no S3. ABDOMEN: Abdomen soft, non-tender, no masses or organomegaly and no hepatosplenomegaly palpable .  Healed surgical scar noted MSK: No CVA tenderness and no tenderness on percussion of the back or rib cage. EXTREMITIES: No edema, no skin discoloration or tenderness NEURO: Alert & oriented , no focal motor  deficits.     LABORATORY DATA: Lab Results  Component Value Date   WBC 6.3 06/13/2013   HGB 10.8* 06/13/2013   HCT 34.2* 06/13/2013   MCV 78.6 06/13/2013   PLT 141* 06/13/2013      Chemistry      Component Value Date/Time   NA 140 06/27/2013 0849   K 4.1 06/27/2013 0849   CL 106 06/27/2013 0849   CO2 25 06/27/2013 0849   BUN 13 06/27/2013 0849   CREATININE 0.51 06/27/2013 0849      Component Value Date/Time   CALCIUM 9.0 06/27/2013 0849   ALKPHOS  63 06/27/2013 0849   AST 15 06/27/2013 0849   ALT 13 06/27/2013 0849   BILITOT 0.2* 06/27/2013 0849       RADIOGRAPHIC STUDIES: Ct Chest W Contrast 06/20/2013   IMPRESSION:  1.  Multinodular goiter, with mild substernal extension and tracheal deviation to the right. 2.  No evidence of thoracic metastatic disease or other acute findings.     06/20/2013   ABDOMEN AND PELVIS WITH CONTRAST    IMPRESSION:  1.  Decreased abdominal lymphadenopathy compared to prior study. 2.  No new or  progressive disease within the abdomen or pelvis. 3.  Small periumbilical hernia containing small bowel.  No evidence of small bowel obstruction or ischemia.   Original Report Authenticated By: Myles Rosenthal, M.D.     ASSESSMENT:  Stage IV adenocarcinoma of the colon with pelvic and lymph node metastasis. She's tolerating palliative intent FOLFOX Avastin chemotherapy without problems. She had grade 1-2 diarrhea following her last cycle and a 20% dose reduction would be reasonable. Her epistaxis I think is most likely from picking her nose and probably mucosal dryness, however there is a possibility that this could be related to Avastin.   PLAN:  1. Reduce  5-FU by 20% due to diarrhea. 2. Patient to apply a saline nasal sprays and avoid picking her nose 3. Return to clinic in 2 weeks. 4. Patient instructed to call if epistaxis is not better with the above measures. If does not improve , then consideration of stopping  Avastin will be made at the next cycle.  Epistaxis is not clinically significant at this time.    All questions were satisfactorily answered. Patient knows to call if  any concern arises.  I spent more than 50 % counseling the patient face to face. The total time spent in the appointment was 30 minutes.   Sherral Hammers, MD FACP. Hematology/Oncology.

## 2013-06-28 LAB — CEA: CEA: 2.9 ng/mL (ref 0.0–5.0)

## 2013-06-29 ENCOUNTER — Encounter (HOSPITAL_BASED_OUTPATIENT_CLINIC_OR_DEPARTMENT_OTHER): Payer: BC Managed Care – PPO

## 2013-06-29 DIAGNOSIS — C189 Malignant neoplasm of colon, unspecified: Secondary | ICD-10-CM

## 2013-06-29 DIAGNOSIS — C182 Malignant neoplasm of ascending colon: Secondary | ICD-10-CM

## 2013-06-29 DIAGNOSIS — C7960 Secondary malignant neoplasm of unspecified ovary: Secondary | ICD-10-CM

## 2013-06-29 DIAGNOSIS — Z452 Encounter for adjustment and management of vascular access device: Secondary | ICD-10-CM

## 2013-06-29 DIAGNOSIS — C778 Secondary and unspecified malignant neoplasm of lymph nodes of multiple regions: Secondary | ICD-10-CM

## 2013-06-29 MED ORDER — SODIUM CHLORIDE 0.9 % IJ SOLN
10.0000 mL | INTRAMUSCULAR | Status: DC | PRN
  Administered 2013-06-29: 10 mL
  Filled 2013-06-29: qty 10

## 2013-06-29 MED ORDER — HEPARIN SOD (PORK) LOCK FLUSH 100 UNIT/ML IV SOLN
INTRAVENOUS | Status: AC
  Filled 2013-06-29: qty 5

## 2013-06-29 MED ORDER — HEPARIN SOD (PORK) LOCK FLUSH 100 UNIT/ML IV SOLN
500.0000 [IU] | Freq: Once | INTRAVENOUS | Status: AC | PRN
  Administered 2013-06-29: 500 [IU]
  Filled 2013-06-29: qty 5

## 2013-06-29 NOTE — Progress Notes (Signed)
Monica Allen presented for Portacath de access and flush after 46 hr continous 28fu.. Tolerated well.. Portacath located lt chest wall deaccessed  Portacath flushed with 20ml NS and 500U/75ml Heparin and needle removed intact. Procedure without incident. Patient tolerated procedure well.

## 2013-07-11 ENCOUNTER — Encounter (HOSPITAL_BASED_OUTPATIENT_CLINIC_OR_DEPARTMENT_OTHER): Payer: BC Managed Care – PPO

## 2013-07-11 ENCOUNTER — Encounter (HOSPITAL_COMMUNITY): Payer: BC Managed Care – PPO | Attending: Oncology

## 2013-07-11 ENCOUNTER — Inpatient Hospital Stay (HOSPITAL_COMMUNITY): Payer: BC Managed Care – PPO

## 2013-07-11 ENCOUNTER — Encounter (HOSPITAL_COMMUNITY): Payer: BC Managed Care – PPO

## 2013-07-11 VITALS — BP 177/61 | HR 61 | Temp 98.3°F | Resp 18 | Wt 234.0 lb

## 2013-07-11 DIAGNOSIS — C7960 Secondary malignant neoplasm of unspecified ovary: Secondary | ICD-10-CM

## 2013-07-11 DIAGNOSIS — C778 Secondary and unspecified malignant neoplasm of lymph nodes of multiple regions: Secondary | ICD-10-CM

## 2013-07-11 DIAGNOSIS — Z5112 Encounter for antineoplastic immunotherapy: Secondary | ICD-10-CM

## 2013-07-11 DIAGNOSIS — Z9889 Other specified postprocedural states: Secondary | ICD-10-CM | POA: Insufficient documentation

## 2013-07-11 DIAGNOSIS — C182 Malignant neoplasm of ascending colon: Secondary | ICD-10-CM

## 2013-07-11 DIAGNOSIS — C801 Malignant (primary) neoplasm, unspecified: Secondary | ICD-10-CM

## 2013-07-11 DIAGNOSIS — Z5111 Encounter for antineoplastic chemotherapy: Secondary | ICD-10-CM

## 2013-07-11 DIAGNOSIS — C189 Malignant neoplasm of colon, unspecified: Secondary | ICD-10-CM

## 2013-07-11 DIAGNOSIS — C799 Secondary malignant neoplasm of unspecified site: Secondary | ICD-10-CM

## 2013-07-11 LAB — URINALYSIS, DIPSTICK ONLY
Bilirubin Urine: NEGATIVE
Glucose, UA: NEGATIVE mg/dL
Hgb urine dipstick: NEGATIVE
Ketones, ur: NEGATIVE mg/dL
Protein, ur: NEGATIVE mg/dL
Urobilinogen, UA: 0.2 mg/dL (ref 0.0–1.0)

## 2013-07-11 LAB — COMPREHENSIVE METABOLIC PANEL
ALT: 13 U/L (ref 0–35)
AST: 18 U/L (ref 0–37)
Alkaline Phosphatase: 63 U/L (ref 39–117)
CO2: 25 mEq/L (ref 19–32)
Calcium: 9.1 mg/dL (ref 8.4–10.5)
Potassium: 3.9 mEq/L (ref 3.5–5.1)
Sodium: 138 mEq/L (ref 135–145)
Total Protein: 6.7 g/dL (ref 6.0–8.3)

## 2013-07-11 LAB — CBC WITH DIFFERENTIAL/PLATELET
Basophils Absolute: 0 10*3/uL (ref 0.0–0.1)
Eosinophils Absolute: 0.1 10*3/uL (ref 0.0–0.7)
Eosinophils Relative: 2 % (ref 0–5)
Lymphocytes Relative: 44 % (ref 12–46)
Lymphs Abs: 2.7 10*3/uL (ref 0.7–4.0)
MCV: 82.6 fL (ref 78.0–100.0)
Neutrophils Relative %: 44 % (ref 43–77)
Platelets: 115 10*3/uL — ABNORMAL LOW (ref 150–400)
RBC: 4.26 MIL/uL (ref 3.87–5.11)
RDW: 20.6 % — ABNORMAL HIGH (ref 11.5–15.5)
WBC: 6.1 10*3/uL (ref 4.0–10.5)

## 2013-07-11 MED ORDER — SODIUM CHLORIDE 0.9 % IV SOLN
5.0000 mg/kg | Freq: Once | INTRAVENOUS | Status: AC
  Administered 2013-07-11: 525 mg via INTRAVENOUS
  Filled 2013-07-11: qty 21

## 2013-07-11 MED ORDER — DEXTROSE 5 % IV SOLN
Freq: Once | INTRAVENOUS | Status: AC
  Administered 2013-07-11: 12:00:00 via INTRAVENOUS

## 2013-07-11 MED ORDER — SODIUM CHLORIDE 0.9 % IV SOLN
Freq: Once | INTRAVENOUS | Status: AC
  Administered 2013-07-11: 11:00:00 via INTRAVENOUS

## 2013-07-11 MED ORDER — FLUOROURACIL CHEMO INJECTION 2.5 GM/50ML
320.0000 mg/m2 | Freq: Once | INTRAVENOUS | Status: AC
  Administered 2013-07-11: 700 mg via INTRAVENOUS
  Filled 2013-07-11: qty 14

## 2013-07-11 MED ORDER — LEUCOVORIN CALCIUM INJECTION 100 MG: 20.0000 mg/m2 | Freq: Once | INTRAMUSCULAR | Status: DC

## 2013-07-11 MED ORDER — SODIUM CHLORIDE 0.9 % IV SOLN
Freq: Once | INTRAVENOUS | Status: AC
  Administered 2013-07-11: 8 mg via INTRAVENOUS
  Filled 2013-07-11: qty 4

## 2013-07-11 MED ORDER — LEUCOVORIN CALCIUM INJECTION 350 MG
400.0000 mg/m2 | Freq: Once | INTRAVENOUS | Status: AC
  Administered 2013-07-11: 864 mg via INTRAVENOUS
  Filled 2013-07-11: qty 43.2

## 2013-07-11 MED ORDER — SODIUM CHLORIDE 0.9 % IV SOLN
1920.0000 mg/m2 | INTRAVENOUS | Status: DC
  Administered 2013-07-11: 4150 mg via INTRAVENOUS
  Filled 2013-07-11: qty 83

## 2013-07-11 MED ORDER — OXALIPLATIN CHEMO INJECTION 100 MG/20ML
85.0000 mg/m2 | Freq: Once | INTRAVENOUS | Status: AC
  Administered 2013-07-11: 185 mg via INTRAVENOUS
  Filled 2013-07-11: qty 37

## 2013-07-11 NOTE — Progress Notes (Signed)
[facility] Telephone:([phone]   Fax:([phone]  OFFICE PROGRESS NOTE  [name], PA-C [address] [place] [address]  DIAGNOSIS: stage IV adenocarcinoma of the colon.   INTERVAL HISTORY:  According to Dr. 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 Loree Fee on 12/13/2012. At that time he did TAH and BSO with lysis of adhesions, partial omentectomy, and resection of pelvic peritoneal implant. At the completion of the surgical procedure there is no evidence of residual disease. Her CEA is within the normal range. Her cancer however did not mark as ovarian in origin and a subsequent colonoscopy revealed a right ascending colon cancer. This was discovered by Dr. Raj Janus on 02/14/2013. It was near circumferential. She will need to have palliative resection.  Monica Allen 57 y.o. female returns to the clinic today for scheduled follow up and chemotherapy cycle #8 visit.  At cycle 7 patient had a 20% dose reduction of 5-FU  due to diarrhea. She tells me that diarrhea has completely resolved and did not recur after her cycle 7.  She is accompanied by her husband today and states that she is doing well denies any pain or shortness of breath . She also tells me that she is tolerating chemotherapy well.  She had complained of mild epistaxis at the last visit but this did not recur after cycle 7.  She denies fever chills.  Denies any significant numbness in the fingers or toes.  She is denies nausea or vomiting. She also reports that she has no difficulty swallowing cold liquids.  MEDICAL HISTORY: Past Medical History  Diagnosis Date  . Diabetes mellitus   . Hypertension   . Enlarged heart   . Goiter   . Nonischemic cardiomyopathy   . Anemia   . Cancer     right colon- ovarian  . Invasive adenocarcinoma of colon 03/08/2013    11/19 LNs POS, THRU TO  ADIPOSE TISSUE    ALLERGIES:  is allergic to actos and ibuprofen.  MEDICATIONS:  Current Outpatient Prescriptions  Medication Sig Dispense Refill  . aspirin EC 81 MG tablet Take 81 mg by mouth every morning.       . carvedilol (COREG CR) 80 MG 24 hr capsule Take 80 mg by mouth daily with breakfast.       . dexamethasone (DECADRON) 4 MG tablet Starting the day after chemo, take 2 tablets in the am for 2 days. Then Stop. Take with food.  24 tablet  1  . furosemide (LASIX) 80 MG tablet Take 80 mg by mouth daily with breakfast.       . glipiZIDE (GLUCOTROL) 10 MG tablet Take 10 mg by mouth daily.       Marland Kitchen lidocaine-prilocaine (EMLA) cream Apply a quarter size amount to port site 1 hour prior to chemo. Do not rub in. Cover with plastic.  30 g  3  . Linagliptin-Metformin HCl (JENTADUETO) 2.03-999 MG TABS Take 1 tablet by mouth daily.       Marland Kitchen LORazepam (ATIVAN) 1 MG tablet May take 1 tab every 4 hours IF needed for nausea/vomiting.  30 tablet  0  . ondansetron (ZOFRAN) 8 MG tablet Starting the day after chemo, take 1 tab in the am and 1 tab in the pm for 2 days. Then may take 1 tab two times a day IF needed for nausea/vomiting.  30  tablet  1  . potassium chloride SA (K-DUR,KLOR-CON) 20 MEQ tablet Take 20 mEq by mouth daily.       . ramipril (ALTACE) 10 MG tablet Take 10 mg by mouth daily with breakfast.       . valsartan (DIOVAN) 80 MG tablet Take 80 mg by mouth daily.       No current facility-administered medications for this visit.   Facility-Administered Medications Ordered in Other Visits  Medication Dose Route Frequency Provider Last Rate Last Dose  . fluorouracil (ADRUCIL) 4,150 mg in sodium chloride 0.9 % 150 mL chemo infusion  1,920 mg/m2 (Treatment Plan Actual) Intravenous 1 day or 1 dose Sherral Hammers, MD      . fluorouracil (ADRUCIL) chemo injection 700 mg  320 mg/m2 (Treatment Plan Actual) Intravenous Once Sherral Hammers, MD      . leucovorin 864 mg in dextrose 5 % 250 mL  infusion  400 mg/m2 (Order-Specific) Intravenous Once Sherral Hammers, MD 147 mL/hr at 07/11/13 1157 864 mg at 07/11/13 1157  . oxaliplatin (ELOXATIN) 185 mg in dextrose 5 % 500 mL chemo infusion  85 mg/m2 (Treatment Plan Actual) Intravenous Once Sherral Hammers, MD 269 mL/hr at 07/11/13 1203 185 mg at 07/11/13 1203    SURGICAL HISTORY:  Past Surgical History  Procedure Laterality Date  . Wisdom tooth extraction    . Laparotomy  12/13/2012    Procedure: EXPLORATORY LAPAROTOMY;  Surgeon: Jeannette Corpus, MD;  [facility]: [facility];  Service: Gynecology;  Laterality: N/A;  . Abdominal hysterectomy  12/13/2012    Procedure: HYSTERECTOMY ABDOMINAL;  Surgeon: Jeannette Corpus, MD;  Location: [facility];  Service: Gynecology;  Laterality: N/A;  . Salpingoophorectomy  12/13/2012    Procedure: SALPINGO OOPHORECTOMY;  Surgeon: Jeannette Corpus, MD;  [facility]: [facility];  Service: Gynecology;  Laterality: Bilateral;  . Omentectomy  12/13/2012    Procedure: OMENTECTOMY;  Surgeon: Jeannette Corpus, MD;  Location: [facility];  Service: Gynecology;  Laterality: N/A;  . Colonoscopy N/A 02/14/2013    Procedure: COLONOSCOPY;  Surgeon: West Bali, MD;  [facility];  Service: Endoscopy;  Laterality: N/A;  2:00 PM-moved to 1115 Kim notified pt  . Partial colectomy N/A 03/01/2013    Procedure: PARTIAL COLECTOMY;  Surgeon: Dalia Heading, MD;  Location: [facility];  Service: General;  Laterality: N/A;  . Portacath placement Left 03/01/2013    Procedure: INSERTION PORT-A-CATH;  Surgeon: Dalia Heading, MD;  Location: [facility];  Service: General;  Laterality: Left;     REVIEW OF SYSTEMS:14 point review of system is as in the history above otherwise negative.  PHYSICAL EXAMINATION:  Wt Readings from Last 3 Encounters:  07/11/13 234 lb (106.142 kg)  06/27/13 228 lb 6.4 oz (103.602 kg)  06/22/13 228 lb 8 oz (103.647 kg)   Temp Readings from Last 3 Encounters:  07/11/13 98.3 F (36.8 C) Oral    06/27/13 97.7 F (36.5 C) Oral  06/22/13 98.3 F (36.8 C) Oral   BP Readings from Last 3 Encounters:  07/11/13 177/61  06/27/13 161/84  06/22/13 172/77   Pulse Readings from Last 3 Encounters:  07/11/13 61  06/27/13 83  06/22/13 62    GENERAL: No acute distress. Obese. SKIN:  No rashes or significant lesions . No ecchymosis or petechial rash. HEAD: Normocephalic, No masses, lesions, tenderness or abnormalities  EYES: Conjunctiva are pink and non-injected and no jaundice ENT: External ears normal ,lips, buccal mucosa, and tongue normal and  mucous membranes are moist . No evidence of thrush. LYMPH: No palpable lymphadenopathy, in the neck, supraclavicular areas. LUNGS: Clear to auscultation , no crackles or wheezes HEART: regular rate & rhythm, no murmurs, no gallops, S1 normal and S2 normal and no S3. ABDOMEN: Abdomen soft, non-tender, no masses or organomegaly and no hepatosplenomegaly palpable MSK: No CVA tenderness and no tenderness on percussion of the back or rib cage. EXTREMITIES: No edema, no skin discoloration or tenderness NEURO: Alert & oriented , no focal motor  deficits.     LABORATORY DATA: Lab Results  Component Value Date   WBC 6.1 07/11/2013   HGB 11.6* 07/11/2013   HCT 35.2* 07/11/2013   MCV 82.6 07/11/2013   PLT 115* 07/11/2013      Chemistry      Component Value Date/Time   NA 138 07/11/2013 0841   K 3.9 07/11/2013 0841   CL 103 07/11/2013 0841   CO2 25 07/11/2013 0841   BUN 12 07/11/2013 0841   CREATININE 0.53 07/11/2013 0841      Component Value Date/Time   CALCIUM 9.1 07/11/2013 0841   ALKPHOS 63 07/11/2013 0841   AST 18 07/11/2013 0841   ALT 13 07/11/2013 0841   BILITOT 0.2* 07/11/2013 0841         ASSESSMENT:  Patient is tolerating chemotherapy very well for metastatic colon cancer. Diarrhea had not recurred with 20 % 5-FU dose reduction. Epistaxis had also not recurred.   PLAN:  1. Continue chemotherapy as scheduled.  2. Returns in clinic in 2 weeks for  chemotherapy and clinic visits. 3. I recommend response assessment after 12 cycles of chemotherapy and based on the scan results patient and the transitioned into observation versus maintenance therapy or continuation of chemotherapy.     All questions were satisfactorily answered. Patient knows to call if  any concern arises.  I spent more than 50 % counseling the patient face to face. The total time spent in the appointment was 30 minutes.   Sherral Hammers, MD FACP. Hematology/Oncology.

## 2013-07-13 ENCOUNTER — Encounter (HOSPITAL_BASED_OUTPATIENT_CLINIC_OR_DEPARTMENT_OTHER): Payer: BC Managed Care – PPO

## 2013-07-13 VITALS — BP 149/66 | HR 59 | Temp 97.8°F | Resp 20

## 2013-07-13 DIAGNOSIS — C189 Malignant neoplasm of colon, unspecified: Secondary | ICD-10-CM

## 2013-07-13 DIAGNOSIS — Z452 Encounter for adjustment and management of vascular access device: Secondary | ICD-10-CM

## 2013-07-13 MED ORDER — HEPARIN SOD (PORK) LOCK FLUSH 100 UNIT/ML IV SOLN
INTRAVENOUS | Status: AC
  Filled 2013-07-13: qty 5

## 2013-07-13 MED ORDER — HEPARIN SOD (PORK) LOCK FLUSH 100 UNIT/ML IV SOLN
500.0000 [IU] | Freq: Once | INTRAVENOUS | Status: AC | PRN
  Administered 2013-07-13: 500 [IU]
  Filled 2013-07-13: qty 5

## 2013-07-13 MED ORDER — SODIUM CHLORIDE 0.9 % IJ SOLN
10.0000 mL | INTRAMUSCULAR | Status: DC | PRN
  Administered 2013-07-13: 10 mL
  Filled 2013-07-13: qty 10

## 2013-07-13 NOTE — Progress Notes (Signed)
Monica Allen returns today for port de access and flush after 46 hr continous infusion of 68fu. Tolerated infusion without problems Flushed port per protocol.

## 2013-07-15 ENCOUNTER — Encounter (HOSPITAL_COMMUNITY): Payer: Self-pay

## 2013-07-25 ENCOUNTER — Encounter (HOSPITAL_COMMUNITY): Payer: Self-pay

## 2013-07-25 ENCOUNTER — Encounter (HOSPITAL_BASED_OUTPATIENT_CLINIC_OR_DEPARTMENT_OTHER): Payer: BC Managed Care – PPO

## 2013-07-25 VITALS — BP 200/83 | HR 81 | Temp 98.1°F | Resp 20 | Wt 235.2 lb

## 2013-07-25 VITALS — BP 200/83 | HR 81 | Temp 98.1°F | Resp 18 | Wt 235.2 lb

## 2013-07-25 DIAGNOSIS — C801 Malignant (primary) neoplasm, unspecified: Secondary | ICD-10-CM

## 2013-07-25 DIAGNOSIS — D696 Thrombocytopenia, unspecified: Secondary | ICD-10-CM

## 2013-07-25 DIAGNOSIS — C182 Malignant neoplasm of ascending colon: Secondary | ICD-10-CM

## 2013-07-25 DIAGNOSIS — C189 Malignant neoplasm of colon, unspecified: Secondary | ICD-10-CM

## 2013-07-25 DIAGNOSIS — D509 Iron deficiency anemia, unspecified: Secondary | ICD-10-CM

## 2013-07-25 DIAGNOSIS — Z452 Encounter for adjustment and management of vascular access device: Secondary | ICD-10-CM

## 2013-07-25 LAB — COMPREHENSIVE METABOLIC PANEL
Albumin: 3 g/dL — ABNORMAL LOW (ref 3.5–5.2)
BUN: 9 mg/dL (ref 6–23)
Calcium: 9.3 mg/dL (ref 8.4–10.5)
Chloride: 106 mEq/L (ref 96–112)
Creatinine, Ser: 0.51 mg/dL (ref 0.50–1.10)
Total Bilirubin: 0.6 mg/dL (ref 0.3–1.2)

## 2013-07-25 LAB — CBC WITH DIFFERENTIAL/PLATELET
Basophils Absolute: 0 10*3/uL (ref 0.0–0.1)
Basophils Relative: 0 % (ref 0–1)
Eosinophils Absolute: 0.2 10*3/uL (ref 0.0–0.7)
Eosinophils Relative: 3 % (ref 0–5)
HCT: 35.2 % — ABNORMAL LOW (ref 36.0–46.0)
Lymphocytes Relative: 37 % (ref 12–46)
MCH: 27.9 pg (ref 26.0–34.0)
MCHC: 33.5 g/dL (ref 30.0–36.0)
MCV: 83.2 fL (ref 78.0–100.0)
Monocytes Absolute: 0.6 10*3/uL (ref 0.1–1.0)
RDW: 17.8 % — ABNORMAL HIGH (ref 11.5–15.5)

## 2013-07-25 LAB — URINALYSIS, DIPSTICK ONLY
Hgb urine dipstick: NEGATIVE
Nitrite: NEGATIVE
Specific Gravity, Urine: >1.03 — ABNORMAL HIGH (ref 1.005–1.030)
Urobilinogen, UA: 0.2 mg/dL (ref 0.0–1.0)
pH: 5.5 (ref 5.0–8.0)

## 2013-07-25 MED ORDER — SODIUM CHLORIDE 0.9 % IJ SOLN
10.0000 mL | INTRAMUSCULAR | Status: DC | PRN
  Administered 2013-07-25: 10 mL via INTRAVENOUS
  Filled 2013-07-25: qty 10

## 2013-07-25 MED ORDER — HEPARIN SOD (PORK) LOCK FLUSH 100 UNIT/ML IV SOLN
INTRAVENOUS | Status: AC
  Filled 2013-07-25: qty 5

## 2013-07-25 MED ORDER — HEPARIN SOD (PORK) LOCK FLUSH 100 UNIT/ML IV SOLN
500.0000 [IU] | Freq: Once | INTRAVENOUS | Status: AC
  Administered 2013-07-25: 500 [IU] via INTRAVENOUS
  Filled 2013-07-25: qty 5

## 2013-07-25 NOTE — Progress Notes (Signed)
[facility] [phone]   [phone]  OFFICE PROGRESS NOTE  [name], PA-C [address] [place] [address]  DIAGNOSIS: stage IV adenocarcinoma of the colon.   INTERVAL HISTORY:  According to Dr. 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 Loree Fee on 12/13/2012. At that time he did TAH and BSO with lysis of adhesions, partial omentectomy, and resection of pelvic peritoneal implant. At the completion of the surgical procedure there is no evidence of residual disease. Her CEA is within the normal range. Her cancer however did not mark as ovarian in origin and a subsequent colonoscopy revealed a right ascending colon cancer. This was discovered by Dr. Raj Janus on 02/14/2013. It was near circumferential. She will need to have palliative resection.  Monica Allen 57 y.o. female returns to the clinic today for scheduled follow up and chemotherapy cycle #9 visit but platelet count too low to be treated.  At cycle 7 patient had a 20% dose reduction of 5-FU  due to diarrhea. She tells me that diarrhea has completely resolved and did not recur after her cycle 7.  She is accompanied by her husband today and states that she is doing well denies any pain or shortness of breath . She also tells me that she is tolerating chemotherapy well.  She had complained of mild epistaxis at the last visit but this did not recur after cycle 7.  She denies fever chills.  Denies any significant numbness in the fingers or toes.  She is denies nausea or vomiting. She also reports that she has no difficulty swallowing cold liquids.  Denies easy bruising, epistaxis, melena, hematochezia, hematuria, or vaginal bleeding.  Appetite good.  No paresthesias today.Platelet count today is 57,000, chemo held   MEDICAL HISTORY: Past Medical History  Diagnosis Date  . Diabetes  mellitus   . Hypertension   . Enlarged heart   . Goiter   . Nonischemic cardiomyopathy   . Anemia   . Cancer     right colon- ovarian  . Invasive adenocarcinoma of colon 03/08/2013    11/19 LNs POS, THRU TO ADIPOSE TISSUE    ALLERGIES:  is allergic to actos and ibuprofen.  MEDICATIONS:  Current Outpatient Prescriptions  Medication Sig Dispense Refill  . aspirin EC 81 MG tablet Take 81 mg by mouth every morning.       . carvedilol (COREG CR) 80 MG 24 hr capsule Take 80 mg by mouth daily with breakfast.       . dexamethasone (DECADRON) 4 MG tablet Starting the day after chemo, take 2 tablets in the am for 2 days. Then Stop. Take with food.  24 tablet  1  . glipiZIDE (GLUCOTROL) 10 MG tablet Take 10 mg by mouth daily.       Marland Kitchen lidocaine-prilocaine (EMLA) cream Apply a quarter size amount to port site 1 hour prior to chemo. Do not rub in. Cover with plastic.  30 g  3  . Linagliptin-Metformin HCl (JENTADUETO) 2.03-999 MG TABS Take 1 tablet by mouth daily.       . potassium chloride SA (K-DUR,KLOR-CON) 20 MEQ tablet Take 20 mEq by mouth 2 (two) times daily.       . furosemide (LASIX) 80 MG tablet Take 80 mg by mouth daily with breakfast.       . LORazepam (ATIVAN) 1 MG tablet May take  1 tab every 4 hours IF needed for nausea/vomiting.  30 tablet  0  . ondansetron (ZOFRAN) 8 MG tablet Starting the day after chemo, take 1 tab in the am and 1 tab in the pm for 2 days. Then may take 1 tab two times a day IF needed for nausea/vomiting.  30 tablet  1  . ramipril (ALTACE) 10 MG tablet Take 10 mg by mouth daily with breakfast.       . valsartan (DIOVAN) 80 MG tablet Take 80 mg by mouth daily.       No current facility-administered medications for this visit.   Facility-Administered Medications Ordered in Other Visits  Medication Dose Route Frequency Provider Last Rate Last Dose  . sodium chloride 0.9 % injection 10 mL  10 mL Intravenous PRN Ellouise Newer, PA-C   10 mL at 07/25/13 1049     SURGICAL HISTORY:  Past Surgical History  Procedure Laterality Date  . Wisdom tooth extraction    . Laparotomy  12/13/2012    Procedure: EXPLORATORY LAPAROTOMY;  Surgeon: Jeannette Corpus, MD;  Location: [facility];  Service: Gynecology;  Laterality: N/A;  . Abdominal hysterectomy  12/13/2012    Procedure: HYSTERECTOMY ABDOMINAL;  Surgeon: Jeannette Corpus, MD;  Location: [facility];  Service: Gynecology;  Laterality: N/A;  . Salpingoophorectomy  12/13/2012    Procedure: SALPINGO OOPHORECTOMY;  Surgeon: Jeannette Corpus, MD;  Location: [facility];  Service: Gynecology;  Laterality: Bilateral;  . Omentectomy  12/13/2012    Procedure: OMENTECTOMY;  Surgeon: Jeannette Corpus, MD;  Location: [facility];  Service: Gynecology;  Laterality: N/A;  . Colonoscopy N/A 02/14/2013    Procedure: COLONOSCOPY;  Surgeon: West Bali, MD;  Location: [facility];  Service: Endoscopy;  Laterality: N/A;  2:00 PM-moved to 1115 Kim notified pt  . Partial colectomy N/A 03/01/2013    Procedure: PARTIAL COLECTOMY;  Surgeon: Dalia Heading, MD;  Location: [facility];  Service: General;  Laterality: N/A;  . Portacath placement Left 03/01/2013    Procedure: INSERTION PORT-A-CATH;  Surgeon: Dalia Heading, MD;  Location: [facility];  Service: General;  Laterality: Left;     REVIEW OF SYSTEMS:14 point review of system is as in the history above otherwise negative.  PHYSICAL EXAMINATION:  Wt Readings from Last 3 Encounters:  07/25/13 235 lb 3.2 oz (106.686 kg)  07/25/13 235 lb 3.2 oz (106.686 kg)  07/11/13 234 lb (106.142 kg)   Temp Readings from Last 3 Encounters:  07/25/13 98.1 F (36.7 C)   07/25/13 98.1 F (36.7 C) Oral  07/13/13 97.8 F (36.6 C) Oral   BP Readings from Last 3 Encounters:  07/25/13 200/83  07/25/13 200/83  07/13/13 149/66   Pulse Readings from Last 3 Encounters:  07/25/13 81  07/25/13 81  07/13/13 59    GENERAL: No acute distress. Obese. SKIN:  No rashes or significant  lesions . No ecchymosis or petechial rash. HEAD: Normocephalic, No masses, lesions, tenderness or abnormalities  EYES: Conjunctiva are pink and non-injected and no jaundice ENT: External ears normal ,lips, buccal mucosa, and tongue normal and mucous membranes are moist . No evidence of thrush. LYMPH: No palpable lymphadenopathy, in the neck, supraclavicular areas. LUNGS: Clear to auscultation , no crackles or wheezes HEART: regular rate & rhythm, no murmurs, no gallops, S1 normal and S2 normal and no S3. ABDOMEN: Abdomen soft, non-tender, no masses or organomegaly and no hepatosplenomegaly palpable MSK: No CVA tenderness and no tenderness on percussion of the  back or rib cage. EXTREMITIES: No edema, no skin discoloration or tenderness NEURO: Alert & oriented , no focal motor  deficits.     LABORATORY DATA: Lab Results  Component Value Date   WBC 5.9 07/25/2013   HGB 11.8* 07/25/2013   HCT 35.2* 07/25/2013   MCV 83.2 07/25/2013   PLT 57* 07/25/2013      Chemistry      Component Value Date/Time   NA 139 07/25/2013 0911   K 3.5 07/25/2013 0911   CL 106 07/25/2013 0911   CO2 25 07/25/2013 0911   BUN 9 07/25/2013 0911   CREATININE 0.51 07/25/2013 0911      Component Value Date/Time   CALCIUM 9.3 07/25/2013 0911   ALKPHOS 63 07/25/2013 0911   AST 40* 07/25/2013 0911   ALT 31 07/25/2013 0911   BILITOT 0.6 07/25/2013 0911         ASSESSMENT:  Patient is tolerating chemotherapy very well for metastatic colon cancer. Diarrhea had not recurred with 20 % 5-FU dose reduction. Epistaxis had also not recurred. Thrombocytopenia secondary to cumulative effect of chemotherapy.  PLAN:  1. Hold chemo today.  2. Returns in clinic in 1 weeks for chemotherapy alone.. 3. Continue to total 12 cycles if tolerated.     All questions were satisfactorily answered. Patient knows to call if  any concern arises.  I spent more than 50 % counseling the patient face to face. The total time spent in the  appointment was 30 minutes.   Maurilio Lovely, MD FACP. Hematology/Oncology.

## 2013-07-25 NOTE — Progress Notes (Signed)
Monica Allen presented for Portacath access and flush. Proper placement of portacath confirmed by CXR. Portacath located left chest wall accessed with  H 20 needle. No blood return. Portacath flushed with 20ml NS and 500U/5ml Heparin and needle removed intact. Procedure without incident. Patient tolerated procedure well.  Chemo deferred x 1 week, platelets 57,000.

## 2013-07-26 LAB — CEA: CEA: 2.8 ng/mL (ref 0.0–5.0)

## 2013-07-27 ENCOUNTER — Encounter (HOSPITAL_COMMUNITY): Payer: BC Managed Care – PPO

## 2013-08-01 ENCOUNTER — Encounter (HOSPITAL_BASED_OUTPATIENT_CLINIC_OR_DEPARTMENT_OTHER): Payer: BC Managed Care – PPO

## 2013-08-01 DIAGNOSIS — C182 Malignant neoplasm of ascending colon: Secondary | ICD-10-CM

## 2013-08-01 DIAGNOSIS — C801 Malignant (primary) neoplasm, unspecified: Secondary | ICD-10-CM

## 2013-08-01 DIAGNOSIS — C189 Malignant neoplasm of colon, unspecified: Secondary | ICD-10-CM

## 2013-08-01 DIAGNOSIS — Z5112 Encounter for antineoplastic immunotherapy: Secondary | ICD-10-CM

## 2013-08-01 DIAGNOSIS — Z5111 Encounter for antineoplastic chemotherapy: Secondary | ICD-10-CM

## 2013-08-01 LAB — CBC WITH DIFFERENTIAL/PLATELET
Basophils Absolute: 0 10*3/uL (ref 0.0–0.1)
Basophils Relative: 0 % (ref 0–1)
Eosinophils Absolute: 0.2 10*3/uL (ref 0.0–0.7)
Eosinophils Relative: 3 % (ref 0–5)
MCH: 27.9 pg (ref 26.0–34.0)
MCV: 86 fL (ref 78.0–100.0)
Platelets: 171 10*3/uL (ref 150–400)
RDW: 16.5 % — ABNORMAL HIGH (ref 11.5–15.5)

## 2013-08-01 MED ORDER — LEUCOVORIN CALCIUM INJECTION 350 MG
400.0000 mg/m2 | Freq: Once | INTRAVENOUS | Status: AC
  Administered 2013-08-01: 864 mg via INTRAVENOUS
  Filled 2013-08-01: qty 43.2

## 2013-08-01 MED ORDER — SODIUM CHLORIDE 0.9 % IV SOLN
Freq: Once | INTRAVENOUS | Status: AC
  Administered 2013-08-01: 8 mg via INTRAVENOUS
  Filled 2013-08-01: qty 4

## 2013-08-01 MED ORDER — SODIUM CHLORIDE 0.9 % IV SOLN
1920.0000 mg/m2 | INTRAVENOUS | Status: DC
  Administered 2013-08-01: 4150 mg via INTRAVENOUS
  Filled 2013-08-01: qty 83

## 2013-08-01 MED ORDER — LEUCOVORIN CALCIUM INJECTION 100 MG: 20.0000 mg/m2 | Freq: Once | INTRAMUSCULAR | Status: DC

## 2013-08-01 MED ORDER — SODIUM CHLORIDE 0.9 % IV SOLN
Freq: Once | INTRAVENOUS | Status: AC
  Administered 2013-08-01: 11:00:00 via INTRAVENOUS

## 2013-08-01 MED ORDER — SODIUM CHLORIDE 0.9 % IV SOLN
5.0000 mg/kg | Freq: Once | INTRAVENOUS | Status: AC
  Administered 2013-08-01: 525 mg via INTRAVENOUS
  Filled 2013-08-01: qty 21

## 2013-08-01 MED ORDER — OXALIPLATIN CHEMO INJECTION 100 MG/20ML
85.0000 mg/m2 | Freq: Once | INTRAVENOUS | Status: AC
  Administered 2013-08-01: 185 mg via INTRAVENOUS
  Filled 2013-08-01: qty 37

## 2013-08-01 MED ORDER — DEXTROSE 5 % IV SOLN
Freq: Once | INTRAVENOUS | Status: AC
  Administered 2013-08-01: 12:00:00 via INTRAVENOUS

## 2013-08-01 MED ORDER — FLUOROURACIL CHEMO INJECTION 2.5 GM/50ML
320.0000 mg/m2 | Freq: Once | INTRAVENOUS | Status: AC
  Administered 2013-08-01: 700 mg via INTRAVENOUS
  Filled 2013-08-01: qty 14

## 2013-08-01 MED ORDER — SODIUM CHLORIDE 0.9 % IJ SOLN: 10.0000 mL | INTRAMUSCULAR | Status: DC | PRN

## 2013-08-01 NOTE — Patient Instructions (Signed)
[facility] Discharge Instructions for Patients Receiving Chemotherapy  Today you received the following chemotherapy agents avastin, oxaliplatin, leucovorin, 72fu.  To help prevent nausea and vomiting after your treatment, we encourage you to take your nausea medication as prescribed.  We will do a dye study on your port on Thursday when you return due to problems with blood return.  If you develop nausea and vomiting that is not controlled by your nausea medication, call the clinic. If it is after clinic hours your family physician or the after hours number for the clinic or go to the Emergency Department.   BELOW ARE SYMPTOMS THAT SHOULD BE REPORTED IMMEDIATELY:  *FEVER GREATER THAN 101.0 F  *CHILLS WITH OR WITHOUT FEVER  NAUSEA AND VOMITING THAT IS NOT CONTROLLED WITH YOUR NAUSEA MEDICATION  *UNUSUAL SHORTNESS OF BREATH  *UNUSUAL BRUISING OR BLEEDING  TENDERNESS IN MOUTH AND THROAT WITH OR WITHOUT PRESENCE OF ULCERS  *URINARY PROBLEMS  *BOWEL PROBLEMS  UNUSUAL RASH Items with * indicate a potential emergency and should be followed up as soon as possible.  One of the nurses will contact you 24 hours after your treatment. Please let the nurse know about any problems that you may have experienced. Feel free to call the clinic you have any questions or concerns. The clinic phone number is [phone].   I have been informed and understand all the instructions given to me. I know to contact the clinic, my physician, or go to the Emergency Department if any problems should occur. I do not have any questions at this time, but understand that I may call the clinic during office hours or the Patient Navigator at [phone] should I have any questions or need assistance in obtaining follow up care.    __________________________________________  _____________  __________ Signature of Patient or Authorized Representative            Date                    Time    __________________________________________ Nurse's Signature

## 2013-08-01 NOTE — Progress Notes (Signed)
Tolerated well

## 2013-08-01 NOTE — Progress Notes (Signed)
Labs drawn today for cbc/diff 

## 2013-08-03 ENCOUNTER — Encounter (HOSPITAL_BASED_OUTPATIENT_CLINIC_OR_DEPARTMENT_OTHER): Payer: BC Managed Care – PPO

## 2013-08-03 DIAGNOSIS — Z452 Encounter for adjustment and management of vascular access device: Secondary | ICD-10-CM

## 2013-08-03 DIAGNOSIS — Z95828 Presence of other vascular implants and grafts: Secondary | ICD-10-CM

## 2013-08-03 DIAGNOSIS — C182 Malignant neoplasm of ascending colon: Secondary | ICD-10-CM

## 2013-08-03 MED ORDER — SODIUM CHLORIDE 0.9 % IJ SOLN
10.0000 mL | INTRAMUSCULAR | Status: DC | PRN
  Administered 2013-08-03: 10 mL via INTRAVENOUS

## 2013-08-03 MED ORDER — HEPARIN SOD (PORK) LOCK FLUSH 100 UNIT/ML IV SOLN
INTRAVENOUS | Status: AC
  Filled 2013-08-03: qty 5

## 2013-08-03 MED ORDER — HEPARIN SOD (PORK) LOCK FLUSH 100 UNIT/ML IV SOLN
500.0000 [IU] | Freq: Once | INTRAVENOUS | Status: AC
  Administered 2013-08-03: 500 [IU] via INTRAVENOUS

## 2013-08-03 NOTE — Progress Notes (Signed)
Monica Allen returns today for port de access and flush after 46 hr continous infusion of 1fu. Tolerated infusion without problems Portacath located lt chest wall deaccessed with  H 20 needle. No blood return and patient wants to wait til next week to do dye study. Portacath flushed with 20ml NS and 500U/6ml Heparin and needle removed intact. Procedure without incident. Patient tolerated procedure well.

## 2013-08-08 ENCOUNTER — Inpatient Hospital Stay (HOSPITAL_COMMUNITY): Payer: BC Managed Care – PPO

## 2013-08-08 ENCOUNTER — Other Ambulatory Visit (HOSPITAL_COMMUNITY): Payer: Self-pay | Admitting: Oncology

## 2013-08-08 DIAGNOSIS — C189 Malignant neoplasm of colon, unspecified: Secondary | ICD-10-CM

## 2013-08-11 ENCOUNTER — Ambulatory Visit (HOSPITAL_COMMUNITY)
Admission: RE | Admit: 2013-08-11 | Discharge: 2013-08-11 | Disposition: A | Discharge status: Home / Self Care | Payer: BC Managed Care – PPO | Source: Ambulatory Visit | Attending: Oncology | Admitting: Oncology

## 2013-08-11 ENCOUNTER — Other Ambulatory Visit (HOSPITAL_COMMUNITY): Payer: Self-pay | Admitting: Oncology

## 2013-08-11 DIAGNOSIS — Z452 Encounter for adjustment and management of vascular access device: Secondary | ICD-10-CM

## 2013-08-11 DIAGNOSIS — T82598A Other mechanical complication of other cardiac and vascular devices and implants, initial encounter: Secondary | ICD-10-CM | POA: Insufficient documentation

## 2013-08-11 DIAGNOSIS — Y849 Medical procedure, unspecified as the cause of abnormal reaction of the patient, or of later complication, without mention of misadventure at the time of the procedure: Secondary | ICD-10-CM | POA: Insufficient documentation

## 2013-08-11 DIAGNOSIS — C189 Malignant neoplasm of colon, unspecified: Secondary | ICD-10-CM

## 2013-08-11 MED ORDER — HEPARIN SOD (PORK) LOCK FLUSH 100 UNIT/ML IV SOLN
INTRAVENOUS | Status: AC
  Administered 2013-08-11: 500 [IU]
  Filled 2013-08-11: qty 5

## 2013-08-11 MED ORDER — HEPARIN (PORCINE) LOCK FLUSH 10 UNIT/ML IV SOLN
10.0000 [IU] | Freq: Once | INTRAVENOUS | Status: AC
  Filled 2013-08-11: qty 1

## 2013-08-11 MED ORDER — IOHEXOL 350 MG/ML SOLN
50.0000 mL | Freq: Once | INTRAVENOUS | Status: AC | PRN
  Administered 2013-08-11: 6 mL via INTRAVENOUS

## 2013-08-14 ENCOUNTER — Encounter (HOSPITAL_COMMUNITY): Payer: BC Managed Care – PPO | Attending: Oncology

## 2013-08-14 DIAGNOSIS — C182 Malignant neoplasm of ascending colon: Secondary | ICD-10-CM

## 2013-08-14 DIAGNOSIS — C189 Malignant neoplasm of colon, unspecified: Secondary | ICD-10-CM | POA: Insufficient documentation

## 2013-08-14 DIAGNOSIS — D696 Thrombocytopenia, unspecified: Secondary | ICD-10-CM | POA: Insufficient documentation

## 2013-08-14 DIAGNOSIS — Z452 Encounter for adjustment and management of vascular access device: Secondary | ICD-10-CM

## 2013-08-14 DIAGNOSIS — C801 Malignant (primary) neoplasm, unspecified: Secondary | ICD-10-CM | POA: Insufficient documentation

## 2013-08-14 MED ORDER — ALTEPLASE 2 MG IJ SOLR
INTRAMUSCULAR | Status: AC
  Filled 2013-08-14: qty 2

## 2013-08-14 MED ORDER — STERILE WATER FOR INJECTION IJ SOLN
INTRAMUSCULAR | Status: AC
  Filled 2013-08-14: qty 10

## 2013-08-14 MED ORDER — ALTEPLASE 2 MG IJ SOLR
2.0000 mg | Freq: Once | INTRAMUSCULAR | Status: AC
  Administered 2013-08-14: 2 mg

## 2013-08-14 NOTE — Progress Notes (Signed)
Monica Allen presented for Portacath access and flush. Proper placement of portacath confirmed by CXR.Portacath.  flushed with 20ml NS  Portacath located lt chest wall accessed with  H 20 needle. No blood return and alteplase 2 mg in 2 ml sterile water instilled and dressing applied and labeled. Procedure without incident. Patient tolerated procedure well.

## 2013-08-15 ENCOUNTER — Other Ambulatory Visit (HOSPITAL_COMMUNITY): Payer: BC Managed Care – PPO

## 2013-08-15 ENCOUNTER — Encounter (HOSPITAL_BASED_OUTPATIENT_CLINIC_OR_DEPARTMENT_OTHER): Payer: BC Managed Care – PPO

## 2013-08-15 VITALS — BP 193/88 | HR 69 | Temp 97.1°F | Resp 16 | Wt 237.2 lb

## 2013-08-15 DIAGNOSIS — C182 Malignant neoplasm of ascending colon: Secondary | ICD-10-CM

## 2013-08-15 DIAGNOSIS — C801 Malignant (primary) neoplasm, unspecified: Secondary | ICD-10-CM

## 2013-08-15 DIAGNOSIS — C189 Malignant neoplasm of colon, unspecified: Secondary | ICD-10-CM

## 2013-08-15 DIAGNOSIS — Z5111 Encounter for antineoplastic chemotherapy: Secondary | ICD-10-CM

## 2013-08-15 DIAGNOSIS — Z5112 Encounter for antineoplastic immunotherapy: Secondary | ICD-10-CM

## 2013-08-15 LAB — COMPREHENSIVE METABOLIC PANEL
Albumin: 3.1 g/dL — ABNORMAL LOW (ref 3.5–5.2)
BUN: 14 mg/dL (ref 6–23)
Calcium: 9.2 mg/dL (ref 8.4–10.5)
Creatinine, Ser: 0.91 mg/dL (ref 0.50–1.10)
GFR calc Af Amer: 81 mL/min — ABNORMAL LOW (ref >90)
Glucose, Bld: 177 mg/dL — ABNORMAL HIGH (ref 70–99)
Potassium: 4.2 mEq/L (ref 3.5–5.1)
Total Protein: 6.8 g/dL (ref 6.0–8.3)

## 2013-08-15 LAB — CBC WITH DIFFERENTIAL/PLATELET
Basophils Relative: 0 % (ref 0–1)
Eosinophils Absolute: 0.1 10*3/uL (ref 0.0–0.7)
Hemoglobin: 12.3 g/dL (ref 12.0–15.0)
MCH: 29 pg (ref 26.0–34.0)
MCHC: 33.6 g/dL (ref 30.0–36.0)
MCV: 86.3 fL (ref 78.0–100.0)
Monocytes Absolute: 0.8 10*3/uL (ref 0.1–1.0)
Monocytes Relative: 12 % (ref 3–12)
Neutro Abs: 2.6 10*3/uL (ref 1.7–7.7)
Neutrophils Relative %: 41 % — ABNORMAL LOW (ref 43–77)
RDW: 15.3 % (ref 11.5–15.5)
WBC: 6.5 10*3/uL (ref 4.0–10.5)

## 2013-08-15 LAB — URINALYSIS, DIPSTICK ONLY
Bilirubin Urine: NEGATIVE
Glucose, UA: NEGATIVE mg/dL
Hgb urine dipstick: NEGATIVE
Specific Gravity, Urine: 1.02 (ref 1.005–1.030)
pH: 6 (ref 5.0–8.0)

## 2013-08-15 MED ORDER — SODIUM CHLORIDE 0.9 % IJ SOLN
10.0000 mL | INTRAMUSCULAR | Status: DC | PRN
  Administered 2013-08-15: 10 mL

## 2013-08-15 MED ORDER — LEUCOVORIN CALCIUM INJECTION 100 MG: 20.0000 mg/m2 | Freq: Once | INTRAMUSCULAR | Status: DC

## 2013-08-15 MED ORDER — SODIUM CHLORIDE 0.9 % IV SOLN
1920.0000 mg/m2 | INTRAVENOUS | Status: DC
  Administered 2013-08-15: 4150 mg via INTRAVENOUS
  Filled 2013-08-15: qty 83

## 2013-08-15 MED ORDER — OXALIPLATIN CHEMO INJECTION 100 MG/20ML
85.0000 mg/m2 | Freq: Once | INTRAVENOUS | Status: AC
  Administered 2013-08-15: 185 mg via INTRAVENOUS
  Filled 2013-08-15: qty 37

## 2013-08-15 MED ORDER — FLUOROURACIL CHEMO INJECTION 2.5 GM/50ML
320.0000 mg/m2 | Freq: Once | INTRAVENOUS | Status: AC
  Administered 2013-08-15: 700 mg via INTRAVENOUS
  Filled 2013-08-15: qty 14

## 2013-08-15 MED ORDER — HEPARIN SOD (PORK) LOCK FLUSH 100 UNIT/ML IV SOLN: 500.0000 [IU] | Freq: Once | INTRAVENOUS | Status: DC | PRN

## 2013-08-15 MED ORDER — SODIUM CHLORIDE 0.9 % IV SOLN
Freq: Once | INTRAVENOUS | Status: AC
  Administered 2013-08-15: 10:00:00 via INTRAVENOUS

## 2013-08-15 MED ORDER — DEXTROSE 5 % IV SOLN
Freq: Once | INTRAVENOUS | Status: AC
  Administered 2013-08-15: 11:00:00 via INTRAVENOUS

## 2013-08-15 MED ORDER — LEUCOVORIN CALCIUM INJECTION 350 MG
400.0000 mg/m2 | Freq: Once | INTRAVENOUS | Status: AC
  Administered 2013-08-15: 864 mg via INTRAVENOUS
  Filled 2013-08-15: qty 43.2

## 2013-08-15 MED ORDER — SODIUM CHLORIDE 0.9 % IV SOLN
Freq: Once | INTRAVENOUS | Status: AC
  Administered 2013-08-15: 8 mg via INTRAVENOUS
  Filled 2013-08-15: qty 4

## 2013-08-15 MED ORDER — SODIUM CHLORIDE 0.9 % IV SOLN
5.0000 mg/kg | Freq: Once | INTRAVENOUS | Status: AC
  Administered 2013-08-15: 525 mg via INTRAVENOUS
  Filled 2013-08-15: qty 21

## 2013-08-15 MED ORDER — SODIUM CHLORIDE 0.9 % IJ SOLN: 10.0000 mL | INTRAMUSCULAR | Status: DC | PRN

## 2013-08-17 ENCOUNTER — Encounter (HOSPITAL_BASED_OUTPATIENT_CLINIC_OR_DEPARTMENT_OTHER): Payer: BC Managed Care – PPO

## 2013-08-17 VITALS — BP 189/84 | HR 60 | Temp 98.4°F

## 2013-08-17 DIAGNOSIS — Z452 Encounter for adjustment and management of vascular access device: Secondary | ICD-10-CM

## 2013-08-17 DIAGNOSIS — C189 Malignant neoplasm of colon, unspecified: Secondary | ICD-10-CM

## 2013-08-17 DIAGNOSIS — C182 Malignant neoplasm of ascending colon: Secondary | ICD-10-CM

## 2013-08-17 MED ORDER — SODIUM CHLORIDE 0.9 % IJ SOLN
10.0000 mL | INTRAMUSCULAR | Status: DC | PRN
  Administered 2013-08-17: 10 mL

## 2013-08-17 MED ORDER — HEPARIN SOD (PORK) LOCK FLUSH 100 UNIT/ML IV SOLN
500.0000 [IU] | Freq: Once | INTRAVENOUS | Status: AC | PRN
  Administered 2013-08-17: 500 [IU]
  Filled 2013-08-17: qty 5

## 2013-08-17 NOTE — Progress Notes (Signed)
.  Monica Allen returns today for port de access and flush after 46 hr continous infusion of 110fu. Tolerated infusion without problems Portacath located lt chest wall deaccessed.  Good blood return present. Portacath flushed with 20ml NS and 500U/49ml Heparin and needle removed intact. Procedure without incident. Patient tolerated procedure well.

## 2013-08-23 ENCOUNTER — Ambulatory Visit (HOSPITAL_COMMUNITY): Payer: BC Managed Care – PPO

## 2013-08-23 ENCOUNTER — Other Ambulatory Visit (HOSPITAL_COMMUNITY): Payer: Self-pay | Admitting: Oncology

## 2013-08-24 ENCOUNTER — Other Ambulatory Visit (HOSPITAL_COMMUNITY): Payer: Self-pay | Admitting: Oncology

## 2013-08-28 ENCOUNTER — Encounter (HOSPITAL_BASED_OUTPATIENT_CLINIC_OR_DEPARTMENT_OTHER): Payer: BC Managed Care – PPO

## 2013-08-28 ENCOUNTER — Other Ambulatory Visit (HOSPITAL_COMMUNITY): Payer: Self-pay | Admitting: Hematology and Oncology

## 2013-08-28 ENCOUNTER — Encounter (HOSPITAL_COMMUNITY): Payer: Self-pay

## 2013-08-28 VITALS — BP 174/79 | HR 67 | Temp 97.4°F | Resp 16 | Wt 239.6 lb

## 2013-08-28 DIAGNOSIS — C189 Malignant neoplasm of colon, unspecified: Secondary | ICD-10-CM

## 2013-08-28 DIAGNOSIS — Z452 Encounter for adjustment and management of vascular access device: Secondary | ICD-10-CM

## 2013-08-28 DIAGNOSIS — D696 Thrombocytopenia, unspecified: Secondary | ICD-10-CM

## 2013-08-28 DIAGNOSIS — C7960 Secondary malignant neoplasm of unspecified ovary: Secondary | ICD-10-CM

## 2013-08-28 DIAGNOSIS — C801 Malignant (primary) neoplasm, unspecified: Secondary | ICD-10-CM

## 2013-08-28 DIAGNOSIS — C182 Malignant neoplasm of ascending colon: Secondary | ICD-10-CM

## 2013-08-28 LAB — CBC WITH DIFFERENTIAL/PLATELET
Basophils Absolute: 0 K/uL (ref 0.0–0.1)
Basophils Relative: 0 % (ref 0–1)
Eosinophils Absolute: 0.1 K/uL (ref 0.0–0.7)
Eosinophils Relative: 2 % (ref 0–5)
HCT: 38.2 % (ref 36.0–46.0)
Hemoglobin: 12.7 g/dL (ref 12.0–15.0)
Lymphocytes Relative: 39 % (ref 12–46)
Lymphs Abs: 2.8 K/uL (ref 0.7–4.0)
MCH: 28.6 pg (ref 26.0–34.0)
MCHC: 33.2 g/dL (ref 30.0–36.0)
MCV: 86 fL (ref 78.0–100.0)
Monocytes Absolute: 0.9 K/uL (ref 0.1–1.0)
Monocytes Relative: 12 % (ref 3–12)
Neutro Abs: 3.4 K/uL (ref 1.7–7.7)
Neutrophils Relative %: 47 % (ref 43–77)
Platelets: 132 K/uL — ABNORMAL LOW (ref 150–400)
RBC: 4.44 MIL/uL (ref 3.87–5.11)
RDW: 14.9 % (ref 11.5–15.5)
WBC: 7.2 K/uL (ref 4.0–10.5)

## 2013-08-28 LAB — COMPREHENSIVE METABOLIC PANEL WITH GFR
ALT: 16 U/L (ref 0–35)
AST: 22 U/L (ref 0–37)
Albumin: 3.2 g/dL — ABNORMAL LOW (ref 3.5–5.2)
Alkaline Phosphatase: 66 U/L (ref 39–117)
BUN: 13 mg/dL (ref 6–23)
CO2: 25 meq/L (ref 19–32)
Calcium: 9.1 mg/dL (ref 8.4–10.5)
Chloride: 99 meq/L (ref 96–112)
Creatinine, Ser: 0.63 mg/dL (ref 0.50–1.10)
GFR calc Af Amer: >90 mL/min
GFR calc non Af Amer: >90 mL/min
Glucose, Bld: 245 mg/dL — ABNORMAL HIGH (ref 70–99)
Potassium: 3.8 meq/L (ref 3.5–5.1)
Sodium: 137 meq/L (ref 135–145)
Total Bilirubin: 0.2 mg/dL — ABNORMAL LOW (ref 0.3–1.2)
Total Protein: 6.8 g/dL (ref 6.0–8.3)

## 2013-08-28 LAB — URINALYSIS, DIPSTICK ONLY
Bilirubin Urine: NEGATIVE
Glucose, UA: 250 mg/dL — AB
Leukocytes, UA: NEGATIVE
Nitrite: NEGATIVE
Specific Gravity, Urine: 1.02 (ref 1.005–1.030)
Urobilinogen, UA: 0.2 mg/dL (ref 0.0–1.0)
pH: 5.5 (ref 5.0–8.0)

## 2013-08-28 MED ORDER — GLIPIZIDE 10 MG PO TABS: 10.0000 mg | ORAL_TABLET | Freq: Every day | ORAL | Status: DC

## 2013-08-28 MED ORDER — CAPECITABINE 500 MG PO TABS: 1500.0000 mg | ORAL_TABLET | Freq: Two times a day (BID) | ORAL | Status: DC

## 2013-08-28 MED ORDER — STERILE WATER FOR INJECTION IJ SOLN
INTRAMUSCULAR | Status: AC
  Filled 2013-08-28: qty 10

## 2013-08-28 MED ORDER — ALTEPLASE 2 MG IJ SOLR
2.0000 mg | Freq: Once | INTRAMUSCULAR | Status: AC | PRN
  Administered 2013-08-28: 2 mg
  Filled 2013-08-28: qty 2

## 2013-08-28 MED ORDER — HEPARIN SOD (PORK) LOCK FLUSH 100 UNIT/ML IV SOLN
INTRAVENOUS | Status: AC
  Filled 2013-08-28: qty 5

## 2013-08-28 NOTE — Progress Notes (Signed)
Port previously accessed and unable to obtain blood.  Alteplase 2 mg instilled and to return tomorrow for removal.

## 2013-08-28 NOTE — Patient Instructions (Signed)
[facility] Discharge Instructions  RECOMMENDATIONS MADE BY THE CONSULTANT AND ANY TEST RESULTS WILL BE SENT TO YOUR REFERRING PHYSICIAN.  EXAM FINDINGS BY THE PHYSICIAN TODAY AND SIGNS OR SYMPTOMS TO REPORT TO CLINIC OR PRIMARY PHYSICIAN: Exam and findings as discussed by Dr. Zigmund Daniel.  Alteplase was instilled into your port and port should not be used until alteplase removed tomorrow morning.  Think about maintenance with xeloda. Report fevers, chills, uncontrolled nausea, vomiting or other problems.  MEDICATIONS PRESCRIBED:  none  INSTRUCTIONS/FOLLOW-UP: Chemotherapy tomorrow if blood counts ok. Follow-up in 2 weeks.  Thank you for choosing [name] to provide your oncology and hematology care.  To afford each patient quality time with our providers, please arrive at least 15 minutes before your scheduled appointment time.  With your help, our goal is to use those 15 minutes to complete the necessary work-up to ensure our physicians have the information they need to help with your evaluation and healthcare recommendations.    Effective January 1st, 2014, we ask that you re-schedule your appointment with our physicians should you arrive 10 or more minutes late for your appointment.  We strive to give you quality time with our providers, and arriving late affects you and other patients whose appointments are after yours.    Again, thank you for choosing [facility].  Our hope is that these requests will decrease the amount of time that you wait before being seen by our physicians.       _____________________________________________________________  Should you have questions after your visit to [facility], please contact our office at [phone] between the hours of 8:30 a.m. and 5:00 p.m.  Voicemails left after 4:30 p.m. will not be returned until the following business day.  For prescription refill requests, have your  pharmacy contact our office with your prescription refill request.

## 2013-08-28 NOTE — Progress Notes (Signed)
Labs drawn today for cbc/diff,cmp 

## 2013-08-28 NOTE — Progress Notes (Signed)
[facility] OFFICE PROGRESS NOTE  [name], PA-C [address] [place] [address]  DIAGNOSIS: Adenocarcinoma of colon - Plan: CBC with Differential, CBC with Differential, alteplase (CATHFLO ACTIVASE) injection 2 mg  Thrombocytopenia, unspecified - Plan: CBC with Differential, CBC with Differential  Other malignant neoplasm without specification of site - Plan: Comprehensive metabolic panel, Urinalysis, dipstick only  Chief Complaint  Patient presents with  . Colon Cancer    CURRENT THERAPY: FOLFOX plus Avastin cycle #11 tomorrow if blood counts permit.  INTERVAL HISTORY: Monica Allen 57 y.o. female returns for continuation of chemotherapy for stage IV colon cancer to receive FOLFOX cycle #11 today and if blood counts permit.  She continues to do well with no nausea, vomiting, diarrhea, constipation, lower extremity swelling or redness, dysphagia, fever, night sweats, cough, wheezing, skin rash, headache, or seizures.  MEDICAL HISTORY: Past Medical History  Diagnosis Date  . Diabetes mellitus   . Hypertension   . Enlarged heart   . Goiter   . Nonischemic cardiomyopathy   . Anemia   . Cancer     right colon- ovarian  . Invasive adenocarcinoma of colon 03/08/2013    11/19 LNs POS, THRU TO ADIPOSE TISSUE    INTERIM HISTORY: has THYROMEGALY; DIABETES MELLITUS, UNCONTROLLED, WITH RENAL COMPLICATIONS; ANXIETY; DEPRESSION; HYPERTENSION; CARDIOMYOPATHY, DILATED; CONGESTIVE HEART FAILURE; SINUSITIS, CHRONIC; ALLERGIC RHINITIS; GERD; ONYCHIA AND PARONYCHIA OF TOE; LOW BACK PAIN; Diabetes mellitus; and Invasive adenocarcinoma of colon on her problem list.   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 Loree Fee on 12/13/2012. At that time he did TAH and BSO with lysis of adhesions, partial omentectomy, and resection of pelvic peritoneal implant. At the completion of the  surgical procedure there is no evidence of residual disease. Her CEA is within the normal range. Her cancer however did not mark as ovarian in origin and a subsequent colonoscopy revealed a right ascending colon cancer. This was discovered by Dr. Raj Janus on 02/14/2013. It was near circumferential. She underwent resection.   ALLERGIES:  is allergic to actos and ibuprofen.  MEDICATIONS: has a current medication list which includes the following prescription(s): aspirin ec, carvedilol, dexamethasone, furosemide, glipizide, lidocaine-prilocaine, linagliptin-metformin hcl, lorazepam, ondansetron, potassium chloride sa, ramipril, capecitabine, and valsartan.  SURGICAL HISTORY:  Past Surgical History  Procedure Laterality Date  . Wisdom tooth extraction    . Laparotomy  12/13/2012    Procedure: EXPLORATORY LAPAROTOMY;  Surgeon: Jeannette Corpus, MD;  Location: [facility];  Service: Gynecology;  Laterality: N/A;  . Abdominal hysterectomy  12/13/2012    Procedure: HYSTERECTOMY ABDOMINAL;  Surgeon: Jeannette Corpus, MD;  Location: [facility];  Service: Gynecology;  Laterality: N/A;  . Salpingoophorectomy  12/13/2012    Procedure: SALPINGO OOPHORECTOMY;  Surgeon: Jeannette Corpus, MD;  Location: [facility];  Service: Gynecology;  Laterality: Bilateral;  . Omentectomy  12/13/2012    Procedure: OMENTECTOMY;  Surgeon: Jeannette Corpus, MD;  Location: [facility];  Service: Gynecology;  Laterality: N/A;  . Colonoscopy N/A 02/14/2013    Procedure: COLONOSCOPY;  Surgeon: West Bali, MD;  Location: [facility];  Service: Endoscopy;  Laterality: N/A;  2:00 PM-moved to 1115 Kim notified pt  . Partial colectomy N/A 03/01/2013    Procedure: PARTIAL COLECTOMY;  Surgeon: Dalia Heading, MD;  Location: [facility];  Service: General;  Laterality: N/A;  . Portacath placement Left 03/01/2013    Procedure:  INSERTION PORT-A-CATH;  Surgeon: Dalia Heading, MD;  Location: [facility];  Service: General;  Laterality: Left;      FAMILY HISTORY: family history includes Diabetes in her maternal grandmother, mother, and sister; Hypertension in her brother, maternal grandmother, mother, and sister; Stroke in her maternal aunt. There is no history of Colon cancer.  SOCIAL HISTORY:  reports that she quit smoking about 25 years ago. Her smoking use included Cigarettes. She has a 37.5 pack-year smoking history. She does not have any smokeless tobacco history on file. She reports that she drinks alcohol. She reports that she does not use illicit drugs.  REVIEW OF SYSTEMS:  Other than that discussed above is noncontributory.  PHYSICAL EXAMINATION: ECOG PERFORMANCE STATUS: 1 - Symptomatic but completely ambulatory  Blood pressure 174/79, pulse 67, temperature 97.4 F (36.3 C), temperature source Oral, resp. rate 16, weight 239 lb 9.6 oz (108.682 kg), last menstrual period 10/07/2012.  GENERAL:alert, no distress and comfortable SKIN: skin color, texture, turgor are normal, no rashes or significant lesions EYES: PERLA; Conjunctiva are pink and non-injected, sclera clear OROPHARYNX:no exudate, no erythema on lips, buccal mucosa, or tongue. NECK: supple, thyroid goiter present, nontender, diffuse bilateral., non-tender, without nodularity. No masses CHEST: Status post light port insertion which showed no evidence of compromise. No breast masses. LYMPH:  no palpable lymphadenopathy in the cervical, axillary or inguinal LUNGS: clear to auscultation and percussion with normal breathing effort HEART: regular rate & rhythm and no murmurs and no lower extremity edema ABDOMEN:abdomen soft, non-tender and normal bowel sounds MUSCULOSKELETAL:no cyanosis of digits and no clubbing. Range of motion normal.  NEURO: alert & oriented x 3 with fluent speech, no focal motor/sensory deficits   LABORATORY DATA: Office Visit on 08/28/2013  Component Date Value Range Status  . WBC 08/28/2013 7.2  4.0 - 10.5 K/uL Final  . RBC 08/28/2013 4.44   3.87 - 5.11 MIL/uL Final  . Hemoglobin 08/28/2013 12.7  12.0 - 15.0 g/dL Final  . HCT 16/08/9603 38.2  36.0 - 46.0 % Final  . MCV 08/28/2013 86.0  78.0 - 100.0 fL Final  . MCH 08/28/2013 28.6  26.0 - 34.0 pg Final  . MCHC 08/28/2013 33.2  30.0 - 36.0 g/dL Final  . RDW 54/07/8118 14.9  11.5 - 15.5 % Final  . Platelets 08/28/2013 132* 150 - 400 K/uL Final  . Neutrophils Relative % 08/28/2013 47  43 - 77 % Final  . Neutro Abs 08/28/2013 3.4  1.7 - 7.7 K/uL Final  . Lymphocytes Relative 08/28/2013 39  12 - 46 % Final  . Lymphs Abs 08/28/2013 2.8  0.7 - 4.0 K/uL Final  . Monocytes Relative 08/28/2013 12  3 - 12 % Final  . Monocytes Absolute 08/28/2013 0.9  0.1 - 1.0 K/uL Final  . Eosinophils Relative 08/28/2013 2  0 - 5 % Final  . Eosinophils Absolute 08/28/2013 0.1  0.0 - 0.7 K/uL Final  . Basophils Relative 08/28/2013 0  0 - 1 % Final  . Basophils Absolute 08/28/2013 0.0  0.0 - 0.1 K/uL Final  Infusion on 08/15/2013  Component Date Value Range Status  . WBC 08/15/2013 6.5  4.0 - 10.5 K/uL Final  . RBC 08/15/2013 4.24  3.87 - 5.11 MIL/uL Final  . Hemoglobin 08/15/2013 12.3  12.0 - 15.0 g/dL Final  . HCT 14/78/2956 36.6  36.0 - 46.0 % Final  . MCV 08/15/2013 86.3  78.0 - 100.0 fL Final  . MCH 08/15/2013 29.0  26.0 - 34.0 pg Final  .  MCHC 08/15/2013 33.6  30.0 - 36.0 g/dL Final  . RDW 16/08/9603 15.3  11.5 - 15.5 % Final  . Platelets 08/15/2013 125* 150 - 400 K/uL Final  . Neutrophils Relative % 08/15/2013 41* 43 - 77 % Final  . Neutro Abs 08/15/2013 2.6  1.7 - 7.7 K/uL Final  . Lymphocytes Relative 08/15/2013 46  12 - 46 % Final  . Lymphs Abs 08/15/2013 3.0  0.7 - 4.0 K/uL Final  . Monocytes Relative 08/15/2013 12  3 - 12 % Final  . Monocytes Absolute 08/15/2013 0.8  0.1 - 1.0 K/uL Final  . Eosinophils Relative 08/15/2013 2  0 - 5 % Final  . Eosinophils Absolute 08/15/2013 0.1  0.0 - 0.7 K/uL Final  . Basophils Relative 08/15/2013 0  0 - 1 % Final  . Basophils Absolute 08/15/2013  0.0  0.0 - 0.1 K/uL Final  . Sodium 08/15/2013 141  135 - 145 mEq/L Final  . Potassium 08/15/2013 4.2  3.5 - 5.1 mEq/L Final  . Chloride 08/15/2013 103  96 - 112 mEq/L Final  . CO2 08/15/2013 29  19 - 32 mEq/L Final  . Glucose, Bld 08/15/2013 177* 70 - 99 mg/dL Final  . BUN 54/07/8118 14  6 - 23 mg/dL Final  . Creatinine, Ser 08/15/2013 0.91  0.50 - 1.10 mg/dL Final  . Calcium 14/78/2956 9.2  8.4 - 10.5 mg/dL Final  . Total Protein 08/15/2013 6.8  6.0 - 8.3 g/dL Final  . Albumin 21/30/8657 3.1* 3.5 - 5.2 g/dL Final  . AST 84/69/6295 18  0 - 37 U/L Final  . ALT 08/15/2013 16  0 - 35 U/L Final  . Alkaline Phosphatase 08/15/2013 67  39 - 117 U/L Final  . Total Bilirubin 08/15/2013 0.2* 0.3 - 1.2 mg/dL Final  . GFR calc non Af Amer 08/15/2013 70* >90 mL/min Final  . GFR calc Af Amer 08/15/2013 81* >90 mL/min Final   Comment: (NOTE)                          The eGFR has been calculated using the CKD EPI equation.                          This calculation has not been validated in all clinical situations.                          eGFR's persistently <90 mL/min signify possible Chronic Kidney                          Disease.  Marland Kitchen Specific Gravity, Urine 08/15/2013 1.020  1.005 - 1.030 Final  . pH 08/15/2013 6.0  5.0 - 8.0 Final  . Glucose, UA 08/15/2013 NEGATIVE  NEGATIVE mg/dL Final  . Hgb urine dipstick 08/15/2013 NEGATIVE  NEGATIVE Final  . Bilirubin Urine 08/15/2013 NEGATIVE  NEGATIVE Final  . Ketones, ur 08/15/2013 NEGATIVE  NEGATIVE mg/dL Final  . Protein, ur 28/41/3244 TRACE* NEGATIVE mg/dL Final  . Urobilinogen, UA 08/15/2013 1.0  0.0 - 1.0 mg/dL Final  . Nitrite 11/11/7251 NEGATIVE  NEGATIVE Final  . Leukocytes, UA 08/15/2013 NEGATIVE  NEGATIVE Final  Infusion on 08/01/2013  Component Date Value Range Status  . WBC 08/01/2013 5.9  4.0 - 10.5 K/uL Final  . RBC 08/01/2013 4.58  3.87 - 5.11 MIL/uL Final  . Hemoglobin 08/01/2013 12.8  12.0 - 15.0 g/dL Final  . HCT 16/08/9603 39.4   36.0 - 46.0 % Final  . MCV 08/01/2013 86.0  78.0 - 100.0 fL Final  . MCH 08/01/2013 27.9  26.0 - 34.0 pg Final  . MCHC 08/01/2013 32.5  30.0 - 36.0 g/dL Final  . RDW 54/07/8118 16.5* 11.5 - 15.5 % Final  . Platelets 08/01/2013 171  150 - 400 K/uL Final  . Neutrophils Relative % 08/01/2013 33* 43 - 77 % Final  . Neutro Abs 08/01/2013 1.9  1.7 - 7.7 K/uL Final  . Lymphocytes Relative 08/01/2013 54* 12 - 46 % Final  . Lymphs Abs 08/01/2013 3.1  0.7 - 4.0 K/uL Final  . Monocytes Relative 08/01/2013 10  3 - 12 % Final  . Monocytes Absolute 08/01/2013 0.6  0.1 - 1.0 K/uL Final  . Eosinophils Relative 08/01/2013 3  0 - 5 % Final  . Eosinophils Absolute 08/01/2013 0.2  0.0 - 0.7 K/uL Final  . Basophils Relative 08/01/2013 0  0 - 1 % Final  . Basophils Absolute 08/01/2013 0.0  0.0 - 0.1 K/uL Final    PATHOLOGY:  Urinalysis    Component Value Date/Time   COLORURINE YELLOW 10/26/2012 2121   APPEARANCEUR CLEAR 10/26/2012 2121   LABSPEC 1.020 08/15/2013 0845   PHURINE 6.0 08/15/2013 0845   GLUCOSEU NEGATIVE 08/15/2013 0845   HGBUR NEGATIVE 08/15/2013 0845   BILIRUBINUR NEGATIVE 08/15/2013 0845   KETONESUR NEGATIVE 08/15/2013 0845   PROTEINUR TRACE* 08/15/2013 0845   UROBILINOGEN 1.0 08/15/2013 0845   NITRITE NEGATIVE 08/15/2013 0845   LEUKOCYTESUR NEGATIVE 08/15/2013 0845    RADIOGRAPHIC STUDIES: Dg Cv Line Injection  08/11/2013   CLINICAL DATA:  Port-A-Cath, unable to obtain blood return  EXAM: CONTRAST INJECTION OF PORT A CATH UNDER FLUOROSCOPY  TECHNIQUE: Contrast was administered via the indwelling port after it was accessed. Fluoroscopic spot images were obtained of the catheter during injection.  CONTRAST:  6mL OMNIPAQUE IOHEXOL 350 MG/ML SOLN  FLUOROSCOPY TIME:  1 min 0 seconds minutes  FINDINGS: Left subclavian Port-A-Cath was accessed by nursing.  With contrast injection, reservoir opacifies normally.  No separation of tubing from the reservoir.  Tubing normally opacifies to the tip.   Significant resistance to injection is in countered.  A small amount of contrast tracks a short distance proximally along the tubing before entering the brachiocephalic vein and SVC.  Findings are most compatible with a small fibrin sheath at the tip of the Port-A-Cath catheter.  Unable to obtain blood return.  Catheter flushed with saline and heparin at conclusion of procedure.  IMPRESSION: High-grade resistance to injection of contrast material into the accessed Port-A-Cath with contrast tracking proximally along the distal aspect of the Port-A-Cath tubing near the tip compatible with a fibrin sheath.   Electronically Signed   By: Ulyses Southward M.D.   On: 08/11/2013 09:55    ASSESSMENT:  #1. Stage IV colon cancer, tolerating lower dose treatment well for cycle #11 of FOLFOX plus Avastin if blood counts permit.   PLAN:  #1. Cycle #11 of FOLFOX plus Avastin today if blood counts allow with 20% reduction in 5-FU dose. #2 both the patient and her husband regarding the possibility of going on maintenance treatment if CT scan performed after 12 cycles of her current treatment shows improvement. Also low we'll be given for 7 days on and 7 days off probably with continuation of Avastin every 3 weeks at 7.5 mg per kilogram.   All questions were answered. The  patient knows to call the clinic with any problems, questions or concerns. We can certainly see the patient much sooner if necessary.   I spent 25 minutes counseling the patient face to face. The total time spent in the appointment was 30 minutes.    Maurilio Lovely, MD 08/28/2013 10:28 AM

## 2013-08-28 NOTE — Addendum Note (Signed)
Addended by: Alla German A on: 08/28/2013 12:54 PM   Modules accepted: Orders, Medications

## 2013-08-29 ENCOUNTER — Encounter (HOSPITAL_BASED_OUTPATIENT_CLINIC_OR_DEPARTMENT_OTHER): Payer: BC Managed Care – PPO

## 2013-08-29 VITALS — BP 136/78 | HR 74 | Temp 98.2°F | Resp 16 | Wt 232.4 lb

## 2013-08-29 DIAGNOSIS — Z5111 Encounter for antineoplastic chemotherapy: Secondary | ICD-10-CM

## 2013-08-29 DIAGNOSIS — C182 Malignant neoplasm of ascending colon: Secondary | ICD-10-CM

## 2013-08-29 DIAGNOSIS — Z5112 Encounter for antineoplastic immunotherapy: Secondary | ICD-10-CM

## 2013-08-29 DIAGNOSIS — C189 Malignant neoplasm of colon, unspecified: Secondary | ICD-10-CM

## 2013-08-29 MED ORDER — SODIUM CHLORIDE 0.9 % IV SOLN
1920.0000 mg/m2 | INTRAVENOUS | Status: DC
  Administered 2013-08-29: 4150 mg via INTRAVENOUS
  Filled 2013-08-29: qty 83

## 2013-08-29 MED ORDER — SODIUM CHLORIDE 0.9 % IV SOLN
Freq: Once | INTRAVENOUS | Status: AC
  Administered 2013-08-29: 8 mg via INTRAVENOUS
  Filled 2013-08-29: qty 4

## 2013-08-29 MED ORDER — LEUCOVORIN CALCIUM INJECTION 100 MG: 20.0000 mg/m2 | Freq: Once | INTRAMUSCULAR | Status: DC

## 2013-08-29 MED ORDER — OXALIPLATIN CHEMO INJECTION 100 MG/20ML
85.0000 mg/m2 | Freq: Once | INTRAVENOUS | Status: AC
  Administered 2013-08-29: 185 mg via INTRAVENOUS
  Filled 2013-08-29: qty 37

## 2013-08-29 MED ORDER — SODIUM CHLORIDE 0.9 % IJ SOLN
10.0000 mL | INTRAMUSCULAR | Status: DC | PRN
  Administered 2013-08-29: 10 mL

## 2013-08-29 MED ORDER — SODIUM CHLORIDE 0.9 % IV SOLN
Freq: Once | INTRAVENOUS | Status: AC
  Administered 2013-08-29: 09:00:00 via INTRAVENOUS

## 2013-08-29 MED ORDER — SODIUM CHLORIDE 0.9 % IV SOLN
5.0000 mg/kg | Freq: Once | INTRAVENOUS | Status: AC
  Administered 2013-08-29: 525 mg via INTRAVENOUS
  Filled 2013-08-29: qty 21

## 2013-08-29 MED ORDER — FLUOROURACIL CHEMO INJECTION 2.5 GM/50ML
320.0000 mg/m2 | Freq: Once | INTRAVENOUS | Status: AC
  Administered 2013-08-29: 700 mg via INTRAVENOUS
  Filled 2013-08-29: qty 14

## 2013-08-29 MED ORDER — LEUCOVORIN CALCIUM INJECTION 350 MG
400.0000 mg/m2 | Freq: Once | INTRAVENOUS | Status: AC
  Administered 2013-08-29: 864 mg via INTRAVENOUS
  Filled 2013-08-29: qty 43.2

## 2013-08-29 NOTE — Progress Notes (Signed)
Good blood return today.  No problems aspirating alteplase or  10 ml blood   Ns infusing.  Tolerated chemo well today.  Home with continuous infusion pump to infuse over 46 hrs.

## 2013-08-31 ENCOUNTER — Encounter (HOSPITAL_BASED_OUTPATIENT_CLINIC_OR_DEPARTMENT_OTHER): Payer: BC Managed Care – PPO

## 2013-08-31 DIAGNOSIS — Z452 Encounter for adjustment and management of vascular access device: Secondary | ICD-10-CM

## 2013-08-31 DIAGNOSIS — C189 Malignant neoplasm of colon, unspecified: Secondary | ICD-10-CM

## 2013-08-31 DIAGNOSIS — C182 Malignant neoplasm of ascending colon: Secondary | ICD-10-CM

## 2013-08-31 MED ORDER — HEPARIN SOD (PORK) LOCK FLUSH 100 UNIT/ML IV SOLN
INTRAVENOUS | Status: AC
  Filled 2013-08-31: qty 5

## 2013-08-31 MED ORDER — SODIUM CHLORIDE 0.9 % IJ SOLN
10.0000 mL | INTRAMUSCULAR | Status: DC | PRN
  Administered 2013-08-31: 10 mL

## 2013-08-31 MED ORDER — HEPARIN SOD (PORK) LOCK FLUSH 100 UNIT/ML IV SOLN
500.0000 [IU] | Freq: Once | INTRAVENOUS | Status: AC | PRN
  Administered 2013-08-31: 500 [IU]

## 2013-08-31 NOTE — Progress Notes (Signed)
Monica Allen presented for Portacath deaccess and flush after continuous 70fu infusion.  Portacath located left chest wall deaccessed and  Portacath flushed with 20ml NS and 500U/29ml Heparin and needle removed intact. Procedure without incident. Patient tolerated procedure well.

## 2013-09-11 ENCOUNTER — Encounter (HOSPITAL_COMMUNITY): Payer: BC Managed Care – PPO

## 2013-09-11 ENCOUNTER — Encounter (HOSPITAL_COMMUNITY): Payer: Self-pay

## 2013-09-11 ENCOUNTER — Other Ambulatory Visit (HOSPITAL_COMMUNITY): Payer: BC Managed Care – PPO

## 2013-09-11 ENCOUNTER — Encounter (HOSPITAL_COMMUNITY): Payer: BC Managed Care – PPO | Attending: Oncology

## 2013-09-11 VITALS — BP 164/99 | HR 93 | Temp 98.4°F | Resp 20 | Wt 235.0 lb

## 2013-09-11 DIAGNOSIS — C189 Malignant neoplasm of colon, unspecified: Secondary | ICD-10-CM | POA: Insufficient documentation

## 2013-09-11 DIAGNOSIS — C801 Malignant (primary) neoplasm, unspecified: Secondary | ICD-10-CM | POA: Insufficient documentation

## 2013-09-11 DIAGNOSIS — D509 Iron deficiency anemia, unspecified: Secondary | ICD-10-CM | POA: Insufficient documentation

## 2013-09-11 DIAGNOSIS — C182 Malignant neoplasm of ascending colon: Secondary | ICD-10-CM

## 2013-09-11 LAB — CBC WITH DIFFERENTIAL/PLATELET
Basophils Absolute: 0 10*3/uL (ref 0.0–0.1)
Eosinophils Absolute: 0.1 10*3/uL (ref 0.0–0.7)
HCT: 38.7 % (ref 36.0–46.0)
Hemoglobin: 13 g/dL (ref 12.0–15.0)
Lymphocytes Relative: 46 % (ref 12–46)
Monocytes Absolute: 0.6 10*3/uL (ref 0.1–1.0)
Monocytes Relative: 9 % (ref 3–12)
Neutro Abs: 2.7 10*3/uL (ref 1.7–7.7)
Neutrophils Relative %: 43 % (ref 43–77)
RDW: 15.2 % (ref 11.5–15.5)
WBC: 6.3 10*3/uL (ref 4.0–10.5)

## 2013-09-11 LAB — BASIC METABOLIC PANEL
CO2: 25 mEq/L (ref 19–32)
Chloride: 99 mEq/L (ref 96–112)
Glucose, Bld: 285 mg/dL — ABNORMAL HIGH (ref 70–99)
Potassium: 4 mEq/L (ref 3.5–5.1)
Sodium: 137 mEq/L (ref 135–145)

## 2013-09-11 MED ORDER — HEPARIN SOD (PORK) LOCK FLUSH 100 UNIT/ML IV SOLN
500.0000 [IU] | Freq: Once | INTRAVENOUS | Status: AC
  Administered 2013-09-11: 500 [IU] via INTRAVENOUS
  Filled 2013-09-11: qty 5

## 2013-09-11 MED ORDER — SODIUM CHLORIDE 0.9 % IJ SOLN
10.0000 mL | INTRAMUSCULAR | Status: AC
  Administered 2013-09-11: 10 mL via INTRAVENOUS

## 2013-09-11 NOTE — Patient Instructions (Signed)
[facility] Discharge Instructions  INSTRUCTIONS/FOLLOW-UP: CT scan in two weeks.  Bring Xeloda to chemo appointment tomorrow (09/12/13).  Avastin every 3 weeks for maintenance (to start in 3 weeks).   Effective January 1st, 2014, we ask that you re-schedule your appointment with our physicians should you arrive 10 or more minutes late for your appointment.  We strive to give you quality time with our providers, and arriving late affects you and other patients whose appointments are after yours.    Again, thank you for choosing [facility].  Our hope is that these requests will decrease the amount of time that you wait before being seen by our physicians.       _____________________________________________________________  Should you have questions after your visit to [facility], please contact our office at [phone] between the hours of 8:30 a.m. and 5:00 p.m.  Voicemails left after 4:30 p.m. will not be returned until the following business day.  For prescription refill requests, have your pharmacy contact our office with your prescription refill request.

## 2013-09-11 NOTE — Progress Notes (Signed)
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 adenocarcinoma of colon    CURRENT THERAPY: FOLFOX plus Avastin for cycle #12 today.  INTERVAL HISTORY: Monica Allen 57 y.o. female returns for inclusion of therapy with FOLFOX plus Avastin for for colon cancer. She's had minimal oral ulcerations which has not interfered with her ability to eat. She raises with bicarbonate of soda. Appetite is good with no nausea, vomiting, diarrhea, melena, hematochezia, hematuria, peripheral paresthesias, lower extremity swelling or redness, cough, but with occasional nasal drip without sore throat, earache, skin rash, headache, or seizures.    MEDICAL HISTORY: Past Medical History  Diagnosis Date  . Diabetes mellitus   . Hypertension   . Enlarged heart   . Goiter   . Nonischemic cardiomyopathy   . Anemia   . Cancer     right colon- ovarian  . Invasive adenocarcinoma of colon 03/08/2013    11/19 LNs POS, THRU TO ADIPOSE TISSUE    INTERIM HISTORY: has THYROMEGALY; DIABETES MELLITUS, UNCONTROLLED, WITH RENAL COMPLICATIONS; ANXIETY; DEPRESSION; HYPERTENSION; CARDIOMYOPATHY, DILATED; CONGESTIVE HEART FAILURE; SINUSITIS, CHRONIC; ALLERGIC RHINITIS; GERD; ONYCHIA AND PARONYCHIA OF TOE; LOW BACK PAIN; Diabetes mellitus; and Invasive adenocarcinoma of colon on her problem list.   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 Loree Fee on  12/13/2012. At that time he did TAH and BSO with lysis of adhesions, partial omentectomy, and resection of pelvic peritoneal implant. At the completion of the surgical procedure there is no evidence of residual disease. Her CEA is within the normal range. Her cancer however did not mark as ovarian in origin and a subsequent colonoscopy revealed a right ascending colon cancer. This was discovered by Dr. Raj Janus on 02/14/2013. It was near circumferential.  She underwent resection  ALLERGIES:  is allergic to actos and ibuprofen.  MEDICATIONS: has a current medication list which includes the following prescription(s): aspirin ec, carvedilol, dexamethasone, furosemide, glipizide, lidocaine-prilocaine, linagliptin-metformin hcl, lorazepam, ondansetron, potassium chloride sa, ramipril, valsartan, and capecitabine.  SURGICAL HISTORY:  Past Surgical History  Procedure Laterality Date  . Wisdom tooth extraction    . Laparotomy  12/13/2012    Procedure: EXPLORATORY LAPAROTOMY;  Surgeon: Jeannette Corpus, MD;  Location: [facility];  Service: Gynecology;  Laterality: N/A;  . Abdominal hysterectomy  12/13/2012    Procedure: HYSTERECTOMY ABDOMINAL;  Surgeon: Jeannette Corpus, MD;  Location: [facility];  Service: Gynecology;  Laterality: N/A;  . Salpingoophorectomy  12/13/2012    Procedure: SALPINGO OOPHORECTOMY;  Surgeon: Jeannette Corpus, MD;  Location: [facility];  Service: Gynecology;  Laterality: Bilateral;  . Omentectomy  12/13/2012    Procedure: OMENTECTOMY;  Surgeon: Jeannette Corpus, MD;  Location: [facility];  Service: Gynecology;  Laterality: N/A;  . Colonoscopy N/A 02/14/2013    Procedure: COLONOSCOPY;  Surgeon: West Bali, MD;  Location: [facility];  Service: Endoscopy;  Laterality: N/A;  2:00 PM-moved to 1115 Kim notified pt  . Partial colectomy N/A 03/01/2013  Procedure: PARTIAL COLECTOMY;  Surgeon: Dalia Heading, MD;  Location: [facility];  Service: General;  Laterality: N/A;  .  Portacath placement Left 03/01/2013    Procedure: INSERTION PORT-A-CATH;  Surgeon: Dalia Heading, MD;  Location: [facility];  Service: General;  Laterality: Left;    FAMILY HISTORY: family history includes Diabetes in her maternal grandmother, mother, and sister; Hypertension in her brother, maternal grandmother, mother, and sister; Stroke in her maternal aunt. There is no history of Colon cancer.  SOCIAL HISTORY:  reports that she quit smoking about 25 years ago. Her smoking use included Cigarettes. She has a 37.5 pack-year smoking history. She does not have any smokeless tobacco history on file. She reports that she drinks alcohol. She reports that she does not use illicit drugs.  REVIEW OF SYSTEMS:  Other than that discussed above is noncontributory.  PHYSICAL EXAMINATION: ECOG PERFORMANCE STATUS: 1 - Symptomatic but completely ambulatory  Blood pressure 164/99, pulse 93, temperature 98.4 F (36.9 C), temperature source Oral, resp. rate 20, weight 235 lb (106.595 kg), last menstrual period 10/07/2012.  GENERAL:alert, no distress and comfortable SKIN: skin color, texture, turgor are normal, no rashes or significant lesions EYES: PERLA; Conjunctiva are pink and non-injected, sclera clear OROPHARYNX:no exudate, no erythema on lips, buccal mucosa, or tongue. No evidence of vesicles or plaques. NECK: supple, thyroid normal size, non-tender, without nodularity. No masses CHEST: Normal AP diameter with light port in place. LYMPH:  no palpable lymphadenopathy in the cervical, axillary or inguinal LUNGS: clear to auscultation and percussion with normal breathing effort HEART: regular rate & rhythm and no murmurs. ABDOMEN:abdomen soft, non-tender and normal bowel sounds. Moderate obesity. MUSCULOSKELETAL:no cyanosis of digits and no clubbing. Range of motion normal.  NEURO: alert & oriented x 3 with fluent speech, no focal motor/sensory deficits   LABORATORY DATA: Office Visit on 09/11/2013    Component Date Value Range Status  . WBC 09/11/2013 6.3  4.0 - 10.5 K/uL Final  . RBC 09/11/2013 4.47  3.87 - 5.11 MIL/uL Final  . Hemoglobin 09/11/2013 13.0  12.0 - 15.0 g/dL Final  . HCT 62/13/0865 38.7  36.0 - 46.0 % Final  . MCV 09/11/2013 86.6  78.0 - 100.0 fL Final  . MCH 09/11/2013 29.1  26.0 - 34.0 pg Final  . MCHC 09/11/2013 33.6  30.0 - 36.0 g/dL Final  . RDW 78/46/9629 15.2  11.5 - 15.5 % Final  . Platelets 09/11/2013 PENDING  150 - 400 K/uL Incomplete  . Neutrophils Relative % 09/11/2013 43  43 - 77 % Final  . Neutro Abs 09/11/2013 2.7  1.7 - 7.7 K/uL Final  . Lymphocytes Relative 09/11/2013 46  12 - 46 % Final  . Lymphs Abs 09/11/2013 2.9  0.7 - 4.0 K/uL Final  . Monocytes Relative 09/11/2013 9  3 - 12 % Final  . Monocytes Absolute 09/11/2013 0.6  0.1 - 1.0 K/uL Final  . Eosinophils Relative 09/11/2013 2  0 - 5 % Final  . Eosinophils Absolute 09/11/2013 0.1  0.0 - 0.7 K/uL Final  . Basophils Relative 09/11/2013 0  0 - 1 % Final  . Basophils Absolute 09/11/2013 0.0  0.0 - 0.1 K/uL Final  . Sodium 09/11/2013 137  135 - 145 mEq/L Final  . Potassium 09/11/2013 4.0  3.5 - 5.1 mEq/L Final  . Chloride 09/11/2013 99  96 - 112 mEq/L Final  . CO2 09/11/2013 25  19 - 32 mEq/L Final  . Glucose, Bld 09/11/2013 285* 70 - 99 mg/dL  Final  . BUN 09/11/2013 11  6 - 23 mg/dL Final  . Creatinine, Ser 09/11/2013 0.93  0.50 - 1.10 mg/dL Final  . Calcium 45/40/9811 9.1  8.4 - 10.5 mg/dL Final  . GFR calc non Af Amer 09/11/2013 68* >90 mL/min Final  . GFR calc Af Amer 09/11/2013 79* >90 mL/min Final   Comment: (NOTE)                          The eGFR has been calculated using the CKD EPI equation.                          This calculation has not been validated in all clinical situations.                          eGFR's persistently <90 mL/min signify possible Chronic Kidney                          Disease.  Office Visit on 08/28/2013  Component Date Value Range Status  . Sodium  08/28/2013 137  135 - 145 mEq/L Final  . Potassium 08/28/2013 3.8  3.5 - 5.1 mEq/L Final  . Chloride 08/28/2013 99  96 - 112 mEq/L Final  . CO2 08/28/2013 25  19 - 32 mEq/L Final  . Glucose, Bld 08/28/2013 245* 70 - 99 mg/dL Final  . BUN 91/47/8295 13  6 - 23 mg/dL Final  . Creatinine, Ser 08/28/2013 0.63  0.50 - 1.10 mg/dL Final  . Calcium 62/13/0865 9.1  8.4 - 10.5 mg/dL Final  . Total Protein 08/28/2013 6.8  6.0 - 8.3 g/dL Final  . Albumin 78/46/9629 3.2* 3.5 - 5.2 g/dL Final  . AST 52/84/1324 22  0 - 37 U/L Final  . ALT 08/28/2013 16  0 - 35 U/L Final  . Alkaline Phosphatase 08/28/2013 66  39 - 117 U/L Final  . Total Bilirubin 08/28/2013 0.2* 0.3 - 1.2 mg/dL Final  . GFR calc non Af Amer 08/28/2013 >90  >90 mL/min Final  . GFR calc Af Amer 08/28/2013 >90  >90 mL/min Final   Comment: (NOTE)                          The eGFR has been calculated using the CKD EPI equation.                          This calculation has not been validated in all clinical situations.                          eGFR's persistently <90 mL/min signify possible Chronic Kidney                          Disease.  Marland Kitchen Specific Gravity, Urine 08/28/2013 1.020  1.005 - 1.030 Final  . pH 08/28/2013 5.5  5.0 - 8.0 Final  . Glucose, UA 08/28/2013 250* NEGATIVE mg/dL Final  . Hgb urine dipstick 08/28/2013 NEGATIVE  NEGATIVE Final  . Bilirubin Urine 08/28/2013 NEGATIVE  NEGATIVE Final  . Ketones, ur 08/28/2013 NEGATIVE  NEGATIVE mg/dL Final  . Protein, ur 40/08/2724 NEGATIVE  NEGATIVE mg/dL Final  . Urobilinogen, UA 08/28/2013 0.2  0.0 - 1.0 mg/dL Final  .  Nitrite 08/28/2013 NEGATIVE  NEGATIVE Final  . Leukocytes, UA 08/28/2013 NEGATIVE  NEGATIVE Final  . WBC 08/28/2013 7.2  4.0 - 10.5 K/uL Final  . RBC 08/28/2013 4.44  3.87 - 5.11 MIL/uL Final  . Hemoglobin 08/28/2013 12.7  12.0 - 15.0 g/dL Final  . HCT 16/08/9603 38.2  36.0 - 46.0 % Final  . MCV 08/28/2013 86.0  78.0 - 100.0 fL Final  . MCH 08/28/2013 28.6  26.0 -  34.0 pg Final  . MCHC 08/28/2013 33.2  30.0 - 36.0 g/dL Final  . RDW 54/07/8118 14.9  11.5 - 15.5 % Final  . Platelets 08/28/2013 132* 150 - 400 K/uL Final  . Neutrophils Relative % 08/28/2013 47  43 - 77 % Final  . Neutro Abs 08/28/2013 3.4  1.7 - 7.7 K/uL Final  . Lymphocytes Relative 08/28/2013 39  12 - 46 % Final  . Lymphs Abs 08/28/2013 2.8  0.7 - 4.0 K/uL Final  . Monocytes Relative 08/28/2013 12  3 - 12 % Final  . Monocytes Absolute 08/28/2013 0.9  0.1 - 1.0 K/uL Final  . Eosinophils Relative 08/28/2013 2  0 - 5 % Final  . Eosinophils Absolute 08/28/2013 0.1  0.0 - 0.7 K/uL Final  . Basophils Relative 08/28/2013 0  0 - 1 % Final  . Basophils Absolute 08/28/2013 0.0  0.0 - 0.1 K/uL Final  Infusion on 08/15/2013  Component Date Value Range Status  . WBC 08/15/2013 6.5  4.0 - 10.5 K/uL Final  . RBC 08/15/2013 4.24  3.87 - 5.11 MIL/uL Final  . Hemoglobin 08/15/2013 12.3  12.0 - 15.0 g/dL Final  . HCT 14/78/2956 36.6  36.0 - 46.0 % Final  . MCV 08/15/2013 86.3  78.0 - 100.0 fL Final  . MCH 08/15/2013 29.0  26.0 - 34.0 pg Final  . MCHC 08/15/2013 33.6  30.0 - 36.0 g/dL Final  . RDW 21/30/8657 15.3  11.5 - 15.5 % Final  . Platelets 08/15/2013 125* 150 - 400 K/uL Final  . Neutrophils Relative % 08/15/2013 41* 43 - 77 % Final  . Neutro Abs 08/15/2013 2.6  1.7 - 7.7 K/uL Final  . Lymphocytes Relative 08/15/2013 46  12 - 46 % Final  . Lymphs Abs 08/15/2013 3.0  0.7 - 4.0 K/uL Final  . Monocytes Relative 08/15/2013 12  3 - 12 % Final  . Monocytes Absolute 08/15/2013 0.8  0.1 - 1.0 K/uL Final  . Eosinophils Relative 08/15/2013 2  0 - 5 % Final  . Eosinophils Absolute 08/15/2013 0.1  0.0 - 0.7 K/uL Final  . Basophils Relative 08/15/2013 0  0 - 1 % Final  . Basophils Absolute 08/15/2013 0.0  0.0 - 0.1 K/uL Final  . Sodium 08/15/2013 141  135 - 145 mEq/L Final  . Potassium 08/15/2013 4.2  3.5 - 5.1 mEq/L Final  . Chloride 08/15/2013 103  96 - 112 mEq/L Final  . CO2 08/15/2013 29  19 - 32  mEq/L Final  . Glucose, Bld 08/15/2013 177* 70 - 99 mg/dL Final  . BUN 84/69/6295 14  6 - 23 mg/dL Final  . Creatinine, Ser 08/15/2013 0.91  0.50 - 1.10 mg/dL Final  . Calcium 28/41/3244 9.2  8.4 - 10.5 mg/dL Final  . Total Protein 08/15/2013 6.8  6.0 - 8.3 g/dL Final  . Albumin 11/11/7251 3.1* 3.5 - 5.2 g/dL Final  . AST 66/44/0347 18  0 - 37 U/L Final  . ALT 08/15/2013 16  0 - 35 U/L Final  . Alkaline  Phosphatase 08/15/2013 67  39 - 117 U/L Final  . Total Bilirubin 08/15/2013 0.2* 0.3 - 1.2 mg/dL Final  . GFR calc non Af Amer 08/15/2013 70* >90 mL/min Final  . GFR calc Af Amer 08/15/2013 81* >90 mL/min Final   Comment: (NOTE)                          The eGFR has been calculated using the CKD EPI equation.                          This calculation has not been validated in all clinical situations.                          eGFR's persistently <90 mL/min signify possible Chronic Kidney                          Disease.  Marland Kitchen Specific Gravity, Urine 08/15/2013 1.020  1.005 - 1.030 Final  . pH 08/15/2013 6.0  5.0 - 8.0 Final  . Glucose, UA 08/15/2013 NEGATIVE  NEGATIVE mg/dL Final  . Hgb urine dipstick 08/15/2013 NEGATIVE  NEGATIVE Final  . Bilirubin Urine 08/15/2013 NEGATIVE  NEGATIVE Final  . Ketones, ur 08/15/2013 NEGATIVE  NEGATIVE mg/dL Final  . Protein, ur 16/08/9603 TRACE* NEGATIVE mg/dL Final  . Urobilinogen, UA 08/15/2013 1.0  0.0 - 1.0 mg/dL Final  . Nitrite 54/07/8118 NEGATIVE  NEGATIVE Final  . Leukocytes, UA 08/15/2013 NEGATIVE  NEGATIVE Final    PATHOLOGY:  Urinalysis    Component Value Date/Time   COLORURINE YELLOW 10/26/2012 2121   APPEARANCEUR CLEAR 10/26/2012 2121   LABSPEC 1.020 08/28/2013 0925   PHURINE 5.5 08/28/2013 0925   GLUCOSEU 250* 08/28/2013 0925   HGBUR NEGATIVE 08/28/2013 0925   BILIRUBINUR NEGATIVE 08/28/2013 0925   KETONESUR NEGATIVE 08/28/2013 0925   PROTEINUR NEGATIVE 08/28/2013 0925   UROBILINOGEN 0.2 08/28/2013 0925   NITRITE NEGATIVE  08/28/2013 0925   LEUKOCYTESUR NEGATIVE 08/28/2013 0925    RADIOGRAPHIC STUDIES: No results found.  ASSESSMENT:  #1. Stage IV colon cancer 4 FOLFOX/Avastin #12 tomorrow.   PLAN:  #1. FOLFOX/Avastin cycle #12 tomorrow. #2. CT scans of the chest abdomen and pelvis in 2 weeks with followup in 3 weeks at which time maintenance therapy will be instituted with Avastin every 3 weeks plus oral Xeloda either 7 days on and 7 days off or 2 weeks on one week off with final decision to be made at that time.   All questions were answered. The patient knows to call the clinic with any problems, questions or concerns. We can certainly see the patient much sooner if necessary.   I spent 25 minutes counseling the patient face to face. The total time spent in the appointment was 30 minutes.    Maurilio Lovely, MD 09/11/2013 1:59 PM

## 2013-09-11 NOTE — Progress Notes (Signed)
Monica Allen presented for Portacath access and flush. Proper placement of portacath confirmed by CXR. Portacath located left chest wall accessed with  H 20 needle. Good blood return present. Portacath flushed with 20ml NS and 500U/34ml Heparin and needle removed intact. Procedure without incident. Patient tolerated procedure well.

## 2013-09-12 ENCOUNTER — Encounter (HOSPITAL_BASED_OUTPATIENT_CLINIC_OR_DEPARTMENT_OTHER): Payer: BC Managed Care – PPO

## 2013-09-12 VITALS — BP 158/103 | HR 93 | Temp 98.7°F | Resp 18 | Wt 237.0 lb

## 2013-09-12 DIAGNOSIS — C189 Malignant neoplasm of colon, unspecified: Secondary | ICD-10-CM

## 2013-09-12 DIAGNOSIS — Z5111 Encounter for antineoplastic chemotherapy: Secondary | ICD-10-CM

## 2013-09-12 DIAGNOSIS — C778 Secondary and unspecified malignant neoplasm of lymph nodes of multiple regions: Secondary | ICD-10-CM

## 2013-09-12 DIAGNOSIS — C182 Malignant neoplasm of ascending colon: Secondary | ICD-10-CM

## 2013-09-12 DIAGNOSIS — C7960 Secondary malignant neoplasm of unspecified ovary: Secondary | ICD-10-CM

## 2013-09-12 DIAGNOSIS — Z5112 Encounter for antineoplastic immunotherapy: Secondary | ICD-10-CM

## 2013-09-12 LAB — URINALYSIS, DIPSTICK ONLY
Bilirubin Urine: NEGATIVE
Glucose, UA: 500 mg/dL — AB
Nitrite: NEGATIVE
Specific Gravity, Urine: >1.03 — ABNORMAL HIGH (ref 1.005–1.030)
Urobilinogen, UA: 0.2 mg/dL (ref 0.0–1.0)
pH: 5.5 (ref 5.0–8.0)

## 2013-09-12 LAB — CEA: CEA: 4.3 ng/mL (ref 0.0–5.0)

## 2013-09-12 MED ORDER — SODIUM CHLORIDE 0.9 % IV SOLN
5.0000 mg/kg | Freq: Once | INTRAVENOUS | Status: AC
  Administered 2013-09-12: 525 mg via INTRAVENOUS
  Filled 2013-09-12: qty 16

## 2013-09-12 MED ORDER — LEUCOVORIN CALCIUM INJECTION 350 MG
400.0000 mg/m2 | Freq: Once | INTRAVENOUS | Status: AC
  Administered 2013-09-12: 864 mg via INTRAVENOUS
  Filled 2013-09-12: qty 43.2

## 2013-09-12 MED ORDER — DEXTROSE 5 % IV SOLN
Freq: Once | INTRAVENOUS | Status: AC
  Administered 2013-09-12: 11:00:00 via INTRAVENOUS

## 2013-09-12 MED ORDER — LEUCOVORIN CALCIUM INJECTION 100 MG: 20.0000 mg/m2 | Freq: Once | INTRAMUSCULAR | Status: DC

## 2013-09-12 MED ORDER — SODIUM CHLORIDE 0.9 % IJ SOLN: 10.0000 mL | INTRAMUSCULAR | Status: DC | PRN

## 2013-09-12 MED ORDER — SODIUM CHLORIDE 0.9 % IV SOLN
1920.0000 mg/m2 | INTRAVENOUS | Status: DC
  Administered 2013-09-12: 4150 mg via INTRAVENOUS
  Filled 2013-09-12: qty 83

## 2013-09-12 MED ORDER — FLUOROURACIL CHEMO INJECTION 2.5 GM/50ML
320.0000 mg/m2 | Freq: Once | INTRAVENOUS | Status: AC
  Administered 2013-09-12: 700 mg via INTRAVENOUS
  Filled 2013-09-12: qty 14

## 2013-09-12 MED ORDER — SODIUM CHLORIDE 0.9 % IV SOLN
Freq: Once | INTRAVENOUS | Status: AC
  Administered 2013-09-12: 09:00:00 via INTRAVENOUS

## 2013-09-12 MED ORDER — SODIUM CHLORIDE 0.9 % IV SOLN
Freq: Once | INTRAVENOUS | Status: AC
  Administered 2013-09-12: 8 mg via INTRAVENOUS
  Filled 2013-09-12: qty 4

## 2013-09-12 MED ORDER — OXALIPLATIN CHEMO INJECTION 100 MG/20ML
85.0000 mg/m2 | Freq: Once | INTRAVENOUS | Status: AC
  Administered 2013-09-12: 185 mg via INTRAVENOUS
  Filled 2013-09-12: qty 37

## 2013-09-12 NOTE — Progress Notes (Signed)
Tolerated chemo well. 

## 2013-09-14 ENCOUNTER — Encounter (HOSPITAL_BASED_OUTPATIENT_CLINIC_OR_DEPARTMENT_OTHER): Payer: BC Managed Care – PPO

## 2013-09-14 DIAGNOSIS — C182 Malignant neoplasm of ascending colon: Secondary | ICD-10-CM

## 2013-09-14 DIAGNOSIS — C189 Malignant neoplasm of colon, unspecified: Secondary | ICD-10-CM

## 2013-09-14 DIAGNOSIS — Z452 Encounter for adjustment and management of vascular access device: Secondary | ICD-10-CM

## 2013-09-14 MED ORDER — SODIUM CHLORIDE 0.9 % IJ SOLN
10.0000 mL | INTRAMUSCULAR | Status: DC | PRN
  Administered 2013-09-14: 10 mL

## 2013-09-14 MED ORDER — HEPARIN SOD (PORK) LOCK FLUSH 100 UNIT/ML IV SOLN
500.0000 [IU] | Freq: Once | INTRAVENOUS | Status: AC | PRN
  Administered 2013-09-14: 500 [IU]
  Filled 2013-09-14: qty 5

## 2013-09-14 NOTE — Progress Notes (Signed)
..  Monica Allen returns today for port de access and flush after 46 hr continous infusion of 35fu. Tolerated infusion without problems Portacath located lt chest wall deaccessed with  H 20 needle. Good blood return present. Portacath flushed with 20ml NS and 500U/62ml Heparin and needle removed intact. Procedure without incident. Patient tolerated procedure well.

## 2013-09-25 NOTE — Patient Instructions (Addendum)
[facility]   CHEMOTHERAPY INSTRUCTIONS  Xeloda - diarrhea, hand-foot syndrome (hands/feet can get red/tender/and skin can peel). Avoid friction and hot environments on hands/feet. Wear cotton socks. Lotion twice a day to hands/fingers/feet/toes with Udder cream. Mucositis (inflammation of any mucosal membrane can develop -this can occur in the throat/mouth). Mouth sores, nausea/vomiting, anemia, fatigue can also develop. Take Imodium if diarrhea develops and contact us immediately - we will give you further instructions on how to take your Imodium. Xeloda usually comes with a teaching packet from the mail order/specialty pharmacy that will supply you with the drug that is really informative about diarrhea and hand-foot syndrome. No pregnant, child bearing age people, or animals should come into contact with this drug. Even though this is in pill form - it is still very powerful!!! If you should be instructed to discontinue taking this drug, bring the drug into the Cancer Clinic and we will dispose of it in a safe manner. Chemotherapy is a biohazard and must be disposed of properly. Do not touch this pill much. It would be best if the caregiver wore gloves while handling. Do not put this pill in with the rest of your pills in a pill box. Keep them in a separate pill box. If you are taking Coumadin while taking this drug, we will need to monitor your PT/INR (lab) closely because Xeloda can increase the effectiveness of Coumadin. You should take this medication 30 minutes after eating your am and pm meals.    POTENTIAL SIDE EFFECTS OF TREATMENT: Increased Susceptibility to Infection, Vomiting, Hair Thinning, Changes in Character of Skin and Nails (brittleness, dryness,etc.), Bone Marrow Suppression, Abdominal Cramping, Nausea, Diarrhea, Sun Sensitivity and Mouth Sores   SELF IMAGE NEEDS AND REFERRALS MADE: EDUCATIONAL MATERIALS GIVEN AND REVIEWED: Specific Instructions  Sheets Xeloda   SELF CARE ACTIVITIES WHILE ON CHEMOTHERAPY: Increase your fluid intake 48 hours prior to treatment and drink at least 2 quarts per day after treatment., No alcohol intake., No aspirin or other medications unless approved by your oncologist., Eat foods that are light and easy to digest., Eat foods at cold or room temperature., No fried, fatty, or spicy foods immediately before or after treatment., Have teeth cleaned professionally before starting treatment. Keep dentures and partial plates clean., Use soft toothbrush and do not use mouthwashes that contain alcohol. Biotene is a good mouthwash that is available at most pharmacies or may be ordered by calling [phone]., Use warm salt water gargles (1 teaspoon salt per 1 quart warm water) before and after meals and at bedtime. Or you may rinse with 2 tablespoons of three -percent hydrogen peroxide mixed in eight ounces of water., Always use sunscreen with SPF (Sun Protection Factor) of 30 or higher., Use your nausea medication as directed to prevent nausea., Use your stool softener or laxative as directed to prevent constipation. and Use your anti-diarrheal medication as directed to stop diarrhea.  Please wash your hands for at least 30 seconds using warm soapy water. Handwashing is the #1 way to prevent the spread of germs. Stay away from sick people or people who are getting over a cold. If you develop respiratory systems such as green/yellow mucus production or productive cough or persistent cough let us know and we will see if you need an antibiotic. It is a good idea to keep a pair of gloves on when going into grocery stores/[employer] to decrease your risk of coming into contact with germs on the carts,  etc. Carry alcohol hand gel with you at all times and use it frequently if out in public. All foods need to be cooked thoroughly. No raw foods. No medium or undercooked meats, eggs. If your food is cooked medium well, it does not need to be  hot pink or saturated with bloody liquid at all. Vegetables and fruits need to be washed/rinsed under the faucet with a dish detergent before being consumed. You can eat raw fruits and vegetables unless we tell you otherwise but it would be best if you cooked them or bought frozen. Do not eat off of salad bars or hot bars unless you really trust the cleanliness of the restaurant. If you need dental work, please let us know before you go for your appointment so that we can coordinate the best possible time for you in regards to your chemo regimen. You need to also let your dentist know that you are actively taking chemo. We may need to do labs prior to your dental appointment. We also want your bowels moving at least every other day. If this is not happening, we need to know so that we can get you on a bowel regimen to help you go.       MEDICATIONS: You have been given prescriptions for the following medications:  Xeloda 500mg  tablet. Take 3 tablets in the am (30 minutes after breakfast) and 3 tablets in the pm (30 minutes after supper). You will take this for 7 days in a row and then be off of the medicine for 7 days in a row. You will repeat this cycle for 6 months.   Over-the-Counter Meds:  Colace - this is a stool softener. Take 100mg  capsule 2-6 times a day as needed. If you have to take more than 6 capsules of Colace a day call the Cancer Center.  Senna - this is a mild laxative used to treat mild constipation. May take up to 3 tabs @ bedtime nightly if needed.   Milk of Magnesia - this is a laxative used to treat moderate to severe constipation. May take 2-4 tablespoons every 8 hours as needed. May increase to 8 tablespoons x 1 dose and if no bowel movement call the Cancer Center.  Imodium - this is for diarrhea. Take 2 tabs after 1st loose stool and then 1 tab after each loose stool until you go a total of 12 hours without a loose stool. Call Cancer Center if loose stools  continue.   SYMPTOMS TO REPORT AS SOON AS POSSIBLE AFTER TREATMENT:  FEVER GREATER THAN 100.5 F  CHILLS WITH OR WITHOUT FEVER  NAUSEA AND VOMITING THAT IS NOT CONTROLLED WITH YOUR NAUSEA MEDICATION  UNUSUAL SHORTNESS OF BREATH  UNUSUAL BRUISING OR BLEEDING  TENDERNESS IN MOUTH AND THROAT WITH OR WITHOUT PRESENCE OF ULCERS  URINARY PROBLEMS  BOWEL PROBLEMS  UNUSUAL RASH    Wear comfortable clothing and clothing appropriate for easy access to any Portacath or PICC line. Let us know if there is anything that we can do to make your therapy better!      I have been informed and understand all of the instructions given to me and have received a copy. I have been instructed to call the clinic [phone] or my family physician as soon as possible for continued medical care, if indicated. I do not have any more questions at this time but understand that I may call the Cancer Center or the Patient Navigator at [phone] during  office hours should I have questions or need assistance in obtaining follow-up care.      _________________________________________      _______________     __________ Signature of Patient or Authorized Representative        Date                            Time      _________________________________________ Nurse's Signature      Capecitabine tablets What is this medicine? CAPECITABINE (ka pe SITE a been) is a chemotherapy drug. It slows the growth of cancer cells. This medicine is used to treat breast cancer, and also colon or rectal cancer. This medicine may be used for other purposes; ask your health care provider or pharmacist if you have questions. COMMON BRAND NAME(S): Xeloda What should I tell my health care provider before I take this medicine? They need to know if you have any of these conditions: -bleeding or blood disorders -dihydropyrimidine dehydrogenase (DPD) deficiency -heart disease -infection (especially a virus  infection such as chickenpox, cold sores, or herpes) -kidney disease -liver disease -an unusual or allergic reaction to capecitabine, 5-fluorouracil, other medicines, foods, dyes, or preservatives -pregnant or trying to get pregnant -breast-feeding How should I use this medicine? Take this medicine by mouth with a glass of water, within 30 minutes of the end of a meal. Follow the directions on the prescription label. Take your medicine at regular intervals. Do not take it more often than directed. Do not stop taking except on your doctor's advice. Your doctor may want you to take a combination of 150 mg and 500 mg tablets for each dose. It is very important that you know how to correctly take your dose. Taking the wrong tablets could result in an overdose (too much medication) or underdose (too little medication). Talk to your pediatrician regarding the use of this medicine in children. Special care may be needed. Overdosage: If you think you have taken too much of this medicine contact a poison control center or emergency room at once. NOTE: This medicine is only for you. Do not share this medicine with others. What if I miss a dose? If you miss a dose, do not take the missed dose at all. Do not take double or extra doses. Instead, continue with your next scheduled dose and check with your doctor. What may interact with this medicine? -antacids with aluminum and/or magnesium -folic acid -leucovorin -medicines to increase blood counts like filgrastim, pegfilgrastim, sargramostim -phenytoin -vaccines -warfarin Talk to your doctor or health care professional before taking any of these medicines: -acetaminophen -aspirin -ibuprofen -ketoprofen -naproxen This list may not describe all possible interactions. Give your health care provider a list of all the medicines, herbs, non-prescription drugs, or dietary supplements you use. Also tell them if you smoke, drink alcohol, or use illegal drugs.  Some items may interact with your medicine. What should I watch for while using this medicine? Visit your doctor for checks on your progress. This drug may make you feel generally unwell. This is not uncommon, as chemotherapy can affect healthy cells as well as cancer cells. Report any side effects. Continue your course of treatment even though you feel ill unless your doctor tells you to stop. In some cases, you may be given additional medicines to help with side effects. Follow all directions for their use. Call your doctor or health care professional for advice if you get a fever, chills  or sore throat, or other symptoms of a cold or flu. Do not treat yourself. This drug decreases your body's ability to fight infections. Try to avoid being around people who are sick. This medicine may increase your risk to bruise or bleed. Call your doctor or health care professional if you notice any unusual bleeding. Be careful brushing and flossing your teeth or using a toothpick because you may get an infection or bleed more easily. If you have any dental work done, tell your dentist you are receiving this medicine. Avoid taking products that contain aspirin, acetaminophen, ibuprofen, naproxen, or ketoprofen unless instructed by your doctor. These medicines may hide a fever. Do not become pregnant while taking this medicine. Women should inform their doctor if they wish to become pregnant or think they might be pregnant. There is a potential for serious side effects to an unborn child. Talk to your health care professional or pharmacist for more information. Do not breast-feed an infant while taking this medicine. Men are advised not to father a child while taking this medicine. What side effects may I notice from receiving this medicine? Side effects that you should report to your doctor or health care professional as soon as possible: -allergic reactions like skin rash, itching or hives, swelling of the face,  lips, or tongue -low blood counts - this medicine may decrease the number of white blood cells, red blood cells and platelets. You may be at increased risk for infections and bleeding. -signs of infection - fever or chills, cough, sore throat, pain or difficulty passing urine -signs of decreased platelets or bleeding - bruising, pinpoint red spots on the skin, black, tarry stools, blood in the urine -signs of decreased red blood cells - unusually weak or tired, fainting spells, lightheadedness -breathing problems -changes in vision -chest pain -diarrhea of more than 4 bowel movements in one day or any diarrhea at night -mouth sores -nausea and vomiting -pain, swelling, redness at site where injected -pain, tingling, numbness in the hands or feet -redness, swelling, or sores on hands or feet -stomach pain -vomiting -yellow color of skin or eyes Side effects that usually do not require medical attention (report to your doctor or health care professional if they continue or are bothersome): -constipation -diarrhea -dry or itchy skin -hair loss -loss of appetite -nausea -weak or tired This list may not describe all possible side effects. Call your doctor for medical advice about side effects. You may report side effects to FDA at 1-800-FDA-1088. Where should I keep my medicine? Keep out of the reach of children. Store at room temperature between 15 and 30 degrees C (59 and 86 degrees F). Keep container tightly closed. Throw away any unused medicine after the expiration date. NOTE: This sheet is a summary. It may not cover all possible information. If you have questions about this medicine, talk to your doctor, pharmacist, or health care provider.  2014, [employer]. (2008-10-08 11:47:41)

## 2013-09-26 ENCOUNTER — Ambulatory Visit (HOSPITAL_COMMUNITY)
Admission: RE | Admit: 2013-09-26 | Discharge: 2013-09-26 | Disposition: A | Discharge status: Home / Self Care | Payer: BC Managed Care – PPO | Source: Ambulatory Visit | Attending: Hematology and Oncology | Admitting: Hematology and Oncology

## 2013-09-26 ENCOUNTER — Ambulatory Visit (HOSPITAL_COMMUNITY): Payer: BC Managed Care – PPO

## 2013-09-26 DIAGNOSIS — Z8543 Personal history of malignant neoplasm of ovary: Secondary | ICD-10-CM | POA: Insufficient documentation

## 2013-09-26 DIAGNOSIS — I700 Atherosclerosis of aorta: Secondary | ICD-10-CM | POA: Insufficient documentation

## 2013-09-26 DIAGNOSIS — E049 Nontoxic goiter, unspecified: Secondary | ICD-10-CM | POA: Insufficient documentation

## 2013-09-26 DIAGNOSIS — Z9049 Acquired absence of other specified parts of digestive tract: Secondary | ICD-10-CM | POA: Insufficient documentation

## 2013-09-26 DIAGNOSIS — C189 Malignant neoplasm of colon, unspecified: Secondary | ICD-10-CM

## 2013-09-26 DIAGNOSIS — Z85038 Personal history of other malignant neoplasm of large intestine: Secondary | ICD-10-CM | POA: Insufficient documentation

## 2013-09-26 DIAGNOSIS — K439 Ventral hernia without obstruction or gangrene: Secondary | ICD-10-CM | POA: Insufficient documentation

## 2013-09-26 DIAGNOSIS — I2510 Atherosclerotic heart disease of native coronary artery without angina pectoris: Secondary | ICD-10-CM | POA: Insufficient documentation

## 2013-09-26 DIAGNOSIS — Z90710 Acquired absence of both cervix and uterus: Secondary | ICD-10-CM | POA: Insufficient documentation

## 2013-09-26 DIAGNOSIS — Z09 Encounter for follow-up examination after completed treatment for conditions other than malignant neoplasm: Secondary | ICD-10-CM | POA: Insufficient documentation

## 2013-09-26 DIAGNOSIS — I7090 Unspecified atherosclerosis: Secondary | ICD-10-CM | POA: Insufficient documentation

## 2013-09-26 MED ORDER — IOHEXOL 300 MG/ML  SOLN
100.0000 mL | Freq: Once | INTRAMUSCULAR | Status: AC | PRN
  Administered 2013-09-26: 100 mL via INTRAVENOUS

## 2013-09-26 NOTE — Progress Notes (Signed)
PT STATES THAT SHE HAS NOT TAKEN METFORMIN FOR THE LAST 3 WEEKS

## 2013-10-02 ENCOUNTER — Encounter (HOSPITAL_COMMUNITY): Payer: BC Managed Care – PPO

## 2013-10-02 ENCOUNTER — Encounter (HOSPITAL_BASED_OUTPATIENT_CLINIC_OR_DEPARTMENT_OTHER): Payer: BC Managed Care – PPO

## 2013-10-02 ENCOUNTER — Ambulatory Visit (HOSPITAL_COMMUNITY): Payer: BC Managed Care – PPO

## 2013-10-02 ENCOUNTER — Encounter (HOSPITAL_COMMUNITY): Payer: Self-pay

## 2013-10-02 VITALS — BP 147/75 | HR 84 | Resp 18

## 2013-10-02 VITALS — Wt 236.6 lb

## 2013-10-02 DIAGNOSIS — D509 Iron deficiency anemia, unspecified: Secondary | ICD-10-CM

## 2013-10-02 DIAGNOSIS — C189 Malignant neoplasm of colon, unspecified: Secondary | ICD-10-CM

## 2013-10-02 DIAGNOSIS — C801 Malignant (primary) neoplasm, unspecified: Secondary | ICD-10-CM

## 2013-10-02 DIAGNOSIS — Z5112 Encounter for antineoplastic immunotherapy: Secondary | ICD-10-CM

## 2013-10-02 LAB — COMPREHENSIVE METABOLIC PANEL
ALT: 14 U/L (ref 0–35)
AST: 18 U/L (ref 0–37)
Albumin: 3.3 g/dL — ABNORMAL LOW (ref 3.5–5.2)
BUN: 11 mg/dL (ref 6–23)
CO2: 24 mEq/L (ref 19–32)
Calcium: 9.2 mg/dL (ref 8.4–10.5)
Creatinine, Ser: 0.47 mg/dL — ABNORMAL LOW (ref 0.50–1.10)
GFR calc Af Amer: >90 mL/min (ref >90)
Total Protein: 7 g/dL (ref 6.0–8.3)

## 2013-10-02 LAB — CBC WITH DIFFERENTIAL/PLATELET
Basophils Absolute: 0 10*3/uL (ref 0.0–0.1)
Basophils Relative: 0 % (ref 0–1)
Eosinophils Relative: 2 % (ref 0–5)
HCT: 38.7 % (ref 36.0–46.0)
MCHC: 33.3 g/dL (ref 30.0–36.0)
MCV: 87.4 fL (ref 78.0–100.0)
Monocytes Absolute: 0.5 10*3/uL (ref 0.1–1.0)
RBC: 4.43 MIL/uL (ref 3.87–5.11)
RDW: 14.8 % (ref 11.5–15.5)

## 2013-10-02 LAB — URINALYSIS, DIPSTICK ONLY
Bilirubin Urine: NEGATIVE
Hgb urine dipstick: NEGATIVE
Leukocytes, UA: NEGATIVE
Nitrite: NEGATIVE
Protein, ur: 30 mg/dL — AB
Specific Gravity, Urine: >1.03 — ABNORMAL HIGH (ref 1.005–1.030)
Urobilinogen, UA: 0.2 mg/dL (ref 0.0–1.0)

## 2013-10-02 MED ORDER — SODIUM CHLORIDE 0.9 % IV SOLN
15.0000 mg/kg | Freq: Once | INTRAVENOUS | Status: AC
  Administered 2013-10-02: 1625 mg via INTRAVENOUS
  Filled 2013-10-02: qty 65

## 2013-10-02 MED ORDER — SODIUM CHLORIDE 0.9 % IJ SOLN
10.0000 mL | INTRAMUSCULAR | Status: DC | PRN
  Administered 2013-10-02: 10 mL

## 2013-10-02 MED ORDER — HEPARIN SOD (PORK) LOCK FLUSH 100 UNIT/ML IV SOLN
500.0000 [IU] | Freq: Once | INTRAVENOUS | Status: AC | PRN
  Administered 2013-10-02: 500 [IU]

## 2013-10-02 MED ORDER — HEPARIN SOD (PORK) LOCK FLUSH 100 UNIT/ML IV SOLN
INTRAVENOUS | Status: AC
  Filled 2013-10-02: qty 5

## 2013-10-02 MED ORDER — SODIUM CHLORIDE 0.9 % IV SOLN
Freq: Once | INTRAVENOUS | Status: AC
  Administered 2013-10-02: 10:00:00 via INTRAVENOUS

## 2013-10-02 NOTE — Progress Notes (Signed)
[facility]  OFFICE PROGRESS NOTE  Terie Purser, PA-C [address] [place] [address]  DIAGNOSIS: Carcinoma of colon metastatic to multiple sites - Plan: valsartan (DIOVAN) 320 MG tablet, glipiZIDE (GLUCOTROL) 10 MG tablet, Lipid panel  Microcytic anemia - Plan: valsartan (DIOVAN) 320 MG tablet, glipiZIDE (GLUCOTROL) 10 MG tablet, Lipid panel  Chief Complaint  Patient presents with  . Colon Cancer    CURRENT THERAPY: 12 cycles of FOLFOX/Avastin ending on 09/12/2013  INTERVAL HISTORY: Monica Allen 57 y.o. female returns for followup after CT scans were performed to discuss future therapy for stage IV colon cancer.  Fortunately her CAT scan showed no evidence of disease. She is here today to begin maintenance therapy with Xeloda plus Avastin. Appetite is good with no nausea, vomiting, diarrhea, constipation, but with some residual paresthesias involving the fingers and toes. She denies any lower extremity swelling or redness, melena, hematochezia, hematuria, vaginal bleeding, epistaxis, or hemoptysis. She denies a worsening joint pain, skin rash, headache, or seizures. MEDICAL HISTORY: Past Medical History  Diagnosis Date  . Diabetes mellitus   . Hypertension   . Enlarged heart   . Goiter   . Nonischemic cardiomyopathy   . Anemia   . Cancer     right colon- ovarian  . Invasive adenocarcinoma of colon 03/08/2013    11/19 LNs POS, THRU TO ADIPOSE TISSUE    INTERIM HISTORY: has THYROMEGALY; DIABETES MELLITUS, UNCONTROLLED, WITH RENAL COMPLICATIONS; ANXIETY; DEPRESSION; HYPERTENSION; CARDIOMYOPATHY, DILATED; CONGESTIVE HEART FAILURE; SINUSITIS, CHRONIC; ALLERGIC RHINITIS; GERD; ONYCHIA AND PARONYCHIA OF TOE; LOW BACK PAIN; Diabetes mellitus; and Invasive adenocarcinoma of colon on her problem list.    ALLERGIES:  is allergic to actos and ibuprofen.  MEDICATIONS: has a current medication list which includes the following  prescription(s): aspirin ec, furosemide, glipizide, lidocaine-prilocaine, linagliptin-metformin hcl, potassium chloride sa, ramipril, valsartan, capecitabine, carvedilol, dexamethasone, lorazepam, and ondansetron, and the following Facility-Administered Medications: bevacizumab (AVASTIN) 1,625 mg in sodium chloride 0.9 % 100 mL chemo infusion and sodium chloride.  SURGICAL HISTORY:  Past Surgical History  Procedure Laterality Date  . Wisdom tooth extraction    . Laparotomy  12/13/2012    Procedure: EXPLORATORY LAPAROTOMY;  Surgeon: Jeannette Corpus, MD;  Location: [facility];  Service: Gynecology;  Laterality: N/A;  . Abdominal hysterectomy  12/13/2012    Procedure: HYSTERECTOMY ABDOMINAL;  Surgeon: Jeannette Corpus, MD;  Location: [facility];  Service: Gynecology;  Laterality: N/A;  . Salpingoophorectomy  12/13/2012    Procedure: SALPINGO OOPHORECTOMY;  Surgeon: Jeannette Corpus, MD;  Location: [facility];  Service: Gynecology;  Laterality: Bilateral;  . Omentectomy  12/13/2012    Procedure: OMENTECTOMY;  Surgeon: Jeannette Corpus, MD;  Location: [facility];  Service: Gynecology;  Laterality: N/A;  . Colonoscopy N/A 02/14/2013    Procedure: COLONOSCOPY;  Surgeon: West Bali, MD;  Location: [facility];  Service: Endoscopy;  Laterality: N/A;  2:00 PM-moved to 1115 Kim notified pt  . Partial colectomy N/A 03/01/2013    Procedure: PARTIAL COLECTOMY;  Surgeon: Dalia Heading, MD;  Location: [facility];  Service: General;  Laterality: N/A;  . Portacath placement Left 03/01/2013    Procedure: INSERTION PORT-A-CATH;  Surgeon: Dalia Heading, MD;  Location: [facility];  Service: General;  Laterality: Left;    FAMILY HISTORY: family history includes Diabetes in her maternal grandmother, mother, and sister; Hypertension in her brother, maternal grandmother, mother, and sister; Stroke in her maternal aunt. There  is no history of Colon cancer.  SOCIAL HISTORY:  reports that she quit smoking about 25 years  ago. Her smoking use included Cigarettes. She has a 37.5 pack-year smoking history. She does not have any smokeless tobacco history on file. She reports that she drinks alcohol. She reports that she does not use illicit drugs.  REVIEW OF SYSTEMS:  Other than that discussed above is noncontributory.  PHYSICAL EXAMINATION: ECOG PERFORMANCE STATUS: 1 - Symptomatic but completely ambulatory  Weight 236 lb 9.6 oz (107.321 kg), last menstrual period 10/07/2012.  GENERAL:alert, no distress and comfortable SKIN: skin color, texture, turgor are normal, no rashes or significant lesions EYES: PERLA; Conjunctiva are pink and non-injected, sclera clear OROPHARYNX:no exudate, no erythema on lips, buccal mucosa, or tongue. NECK: supple, thyroid diffusely enlarged and nontender,  without nodularity. No masses CHEST: Normal AP diameter with light port in place. LYMPH:  no palpable lymphadenopathy in the cervical, axillary or inguinal LUNGS: clear to auscultation and percussion with normal breathing effort HEART: regular rate & rhythm and no murmurs. ABDOMEN:abdomen soft, non-tender and normal bowel sounds MUSCULOSKELETAL:no cyanosis of digits and no clubbing. Range of motion normal.  NEURO: alert & oriented x 3 with fluent speech, no focal motor/sensory deficits   LABORATORY DATA: Infusion on 10/02/2013  Component Date Value Range Status  . WBC 10/02/2013 4.7  4.0 - 10.5 K/uL Final  . RBC 10/02/2013 4.43  3.87 - 5.11 MIL/uL Final  . Hemoglobin 10/02/2013 12.9  12.0 - 15.0 g/dL Final  . HCT 16/08/9603 38.7  36.0 - 46.0 % Final  . MCV 10/02/2013 87.4  78.0 - 100.0 fL Final  . MCH 10/02/2013 29.1  26.0 - 34.0 pg Final  . MCHC 10/02/2013 33.3  30.0 - 36.0 g/dL Final  . RDW 54/07/8118 14.8  11.5 - 15.5 % Final  . Platelets 10/02/2013 162  150 - 400 K/uL Final  . Neutrophils Relative % 10/02/2013 29* 43 - 77 % Final  . Neutro Abs 10/02/2013 1.4* 1.7 - 7.7 K/uL Final  . Lymphocytes Relative 10/02/2013  58* 12 - 46 % Final  . Lymphs Abs 10/02/2013 2.7  0.7 - 4.0 K/uL Final  . Monocytes Relative 10/02/2013 11  3 - 12 % Final  . Monocytes Absolute 10/02/2013 0.5  0.1 - 1.0 K/uL Final  . Eosinophils Relative 10/02/2013 2  0 - 5 % Final  . Eosinophils Absolute 10/02/2013 0.1  0.0 - 0.7 K/uL Final  . Basophils Relative 10/02/2013 0  0 - 1 % Final  . Basophils Absolute 10/02/2013 0.0  0.0 - 0.1 K/uL Final  . Sodium 10/02/2013 139  135 - 145 mEq/L Final  . Potassium 10/02/2013 3.5  3.5 - 5.1 mEq/L Final  . Chloride 10/02/2013 102  96 - 112 mEq/L Final  . CO2 10/02/2013 24  19 - 32 mEq/L Final  . Glucose, Bld 10/02/2013 277* 70 - 99 mg/dL Final  . BUN 14/78/2956 11  6 - 23 mg/dL Final  . Creatinine, Ser 10/02/2013 0.47* 0.50 - 1.10 mg/dL Final  . Calcium 21/30/8657 9.2  8.4 - 10.5 mg/dL Final  . Total Protein 10/02/2013 7.0  6.0 - 8.3 g/dL Final  . Albumin 84/69/6295 3.3* 3.5 - 5.2 g/dL Final  . AST 28/41/3244 18  0 - 37 U/L Final  . ALT 10/02/2013 14  0 - 35 U/L Final  . Alkaline Phosphatase 10/02/2013 72  39 - 117 U/L Final  . Total Bilirubin 10/02/2013 0.3  0.3 - 1.2 mg/dL Final  .  GFR calc non Af Amer 10/02/2013 >90  >90 mL/min Final  . GFR calc Af Amer 10/02/2013 >90  >90 mL/min Final   Comment: (NOTE)                          The eGFR has been calculated using the CKD EPI equation.                          This calculation has not been validated in all clinical situations.                          eGFR's persistently <90 mL/min signify possible Chronic Kidney                          Disease.  Marland Kitchen Specific Gravity, Urine 10/02/2013 >1.030* 1.005 - 1.030 Final  . pH 10/02/2013 5.5  5.0 - 8.0 Final  . Glucose, UA 10/02/2013 >1000* NEGATIVE mg/dL Final  . Hgb urine dipstick 10/02/2013 NEGATIVE  NEGATIVE Final  . Bilirubin Urine 10/02/2013 NEGATIVE  NEGATIVE Final  . Ketones, ur 10/02/2013 15* NEGATIVE mg/dL Final  . Protein, ur 16/08/9603 30* NEGATIVE mg/dL Final  . Urobilinogen, UA  10/02/2013 0.2  0.0 - 1.0 mg/dL Final  . Nitrite 54/07/8118 NEGATIVE  NEGATIVE Final  . Leukocytes, UA 10/02/2013 NEGATIVE  NEGATIVE Final  Infusion on 09/12/2013  Component Date Value Range Status  . Specific Gravity, Urine 09/12/2013 >1.030* 1.005 - 1.030 Final  . pH 09/12/2013 5.5  5.0 - 8.0 Final  . Glucose, UA 09/12/2013 500* NEGATIVE mg/dL Final  . Hgb urine dipstick 09/12/2013 TRACE* NEGATIVE Final  . Bilirubin Urine 09/12/2013 NEGATIVE  NEGATIVE Final  . Ketones, ur 09/12/2013 TRACE* NEGATIVE mg/dL Final  . Protein, ur 14/78/2956 100* NEGATIVE mg/dL Final  . Urobilinogen, UA 09/12/2013 0.2  0.0 - 1.0 mg/dL Final  . Nitrite 21/30/8657 NEGATIVE  NEGATIVE Final  . Leukocytes, UA 09/12/2013 TRACE* NEGATIVE Final  Office Visit on 09/11/2013  Component Date Value Range Status  . WBC 09/11/2013 6.3  4.0 - 10.5 K/uL Final  . RBC 09/11/2013 4.47  3.87 - 5.11 MIL/uL Final  . Hemoglobin 09/11/2013 13.0  12.0 - 15.0 g/dL Final  . HCT 84/69/6295 38.7  36.0 - 46.0 % Final  . MCV 09/11/2013 86.6  78.0 - 100.0 fL Final  . MCH 09/11/2013 29.1  26.0 - 34.0 pg Final  . MCHC 09/11/2013 33.6  30.0 - 36.0 g/dL Final  . RDW 28/41/3244 15.2  11.5 - 15.5 % Final  . Platelets 09/11/2013 105* 150 - 400 K/uL Final  . Neutrophils Relative % 09/11/2013 43  43 - 77 % Final  . Neutro Abs 09/11/2013 2.7  1.7 - 7.7 K/uL Final  . Lymphocytes Relative 09/11/2013 46  12 - 46 % Final  . Lymphs Abs 09/11/2013 2.9  0.7 - 4.0 K/uL Final  . Monocytes Relative 09/11/2013 9  3 - 12 % Final  . Monocytes Absolute 09/11/2013 0.6  0.1 - 1.0 K/uL Final  . Eosinophils Relative 09/11/2013 2  0 - 5 % Final  . Eosinophils Absolute 09/11/2013 0.1  0.0 - 0.7 K/uL Final  . Basophils Relative 09/11/2013 0  0 - 1 % Final  . Basophils Absolute 09/11/2013 0.0  0.0 - 0.1 K/uL Final  . Smear Review 09/11/2013 PLATELET COUNT CONFIRMED BY SMEAR   Corrected  PLATELETS APPEAR DECREASED  . Sodium 09/11/2013 137  135 - 145 mEq/L Final    . Potassium 09/11/2013 4.0  3.5 - 5.1 mEq/L Final  . Chloride 09/11/2013 99  96 - 112 mEq/L Final  . CO2 09/11/2013 25  19 - 32 mEq/L Final  . Glucose, Bld 09/11/2013 285* 70 - 99 mg/dL Final  . BUN 16/08/9603 11  6 - 23 mg/dL Final  . Creatinine, Ser 09/11/2013 0.93  0.50 - 1.10 mg/dL Final  . Calcium 54/07/8118 9.1  8.4 - 10.5 mg/dL Final  . GFR calc non Af Amer 09/11/2013 68* >90 mL/min Final  . GFR calc Af Amer 09/11/2013 79* >90 mL/min Final   Comment: (NOTE)                          The eGFR has been calculated using the CKD EPI equation.                          This calculation has not been validated in all clinical situations.                          eGFR's persistently <90 mL/min signify possible Chronic Kidney                          Disease.  . CEA 09/11/2013 4.3  0.0 - 5.0 ng/mL Final   Performed at [facility]    PATHOLOGY:  Urinalysis    Component Value Date/Time   COLORURINE YELLOW 10/26/2012 2121   APPEARANCEUR CLEAR 10/26/2012 2121   LABSPEC >1.030* 10/02/2013 0911   PHURINE 5.5 10/02/2013 0911   GLUCOSEU >1000* 10/02/2013 0911   HGBUR NEGATIVE 10/02/2013 0911   BILIRUBINUR NEGATIVE 10/02/2013 0911   KETONESUR 15* 10/02/2013 0911   PROTEINUR 30* 10/02/2013 0911   UROBILINOGEN 0.2 10/02/2013 0911   NITRITE NEGATIVE 10/02/2013 0911   LEUKOCYTESUR NEGATIVE 10/02/2013 0911    RADIOGRAPHIC STUDIES: Ct Chest W Contrast  09/26/2013   CLINICAL DATA:  History of colon and ovarian cancer. Follow-up study status post chemotherapy.  EXAM: CT CHEST, ABDOMEN, AND PELVIS WITH CONTRAST  TECHNIQUE: Multidetector CT imaging of the chest, abdomen and pelvis was performed following the standard protocol during bolus administration of intravenous contrast.  CONTRAST:  OMNIPAQUE IOHEXOL 300 MG/ML  SOLN  COMPARISON:  CT of the chest, abdomen and pelvis 06/20/2013.  FINDINGS: CT CHEST FINDINGS  Mediastinum: Heart size is normal. There is no significant pericardial  fluid, thickening or pericardial calcification. There is atherosclerosis of the thoracic aorta, the great vessels of the mediastinum and the coronary arteries, including calcified atherosclerotic plaque in the left main and left anterior descending coronary arteries. No pathologically enlarged mediastinal or hilar lymph nodes. Esophagus is unremarkable in appearance. Left-sided subclavian single-lumen porta cath with tip terminating in the mid superior vena cava. Enlarged and diffusely heterogeneous thyroid gland with some substernal extension, presumably a multinodular goiter (unchanged).  Lungs/Pleura: No acute consolidative airspace disease. No pleural effusions. No suspicious appearing pulmonary nodules or masses are identified. Small cluster of 2-3 mm nodules distributed in a peribronchovascular fashion in the periphery of the right lower lobe is unchanged compared to the prior examination, likely to represent areas of mucoid impaction within terminal bronchioles.  Musculoskeletal: There are no aggressive appearing lytic or blastic lesions noted in the visualized portions of the  skeleton.  CT ABDOMEN AND PELVIS FINDINGS  Abdomen/Pelvis: The appearance of the liver, gallbladder, pancreas, spleen, bilateral adrenal glands and bilateral kidneys is unremarkable. No significant volume of ascites. No pneumoperitoneum. No pathologic distention of small bowel. Borderline enlarged left para-aortic lymph node measuring 10 mm in short axis (image 68 of series 2) is similar to the prior study. Numerous other borderline enlarged retroperitoneal lymph nodes are nonspecific, and similar to the prior examination. No other definite pathologic nodal enlargement is identified at this time. Status post right hemicolectomy. Paraumbilical ventral hernia containing multiple loops of small bowel without evidence of bowel incarceration obstruction at this time. Atherosclerosis throughout the abdominal and pelvic vasculature, without  evidence of aneurysm or dissection. Status post total abdominal hysterectomy and bilateral salpingo-oophorectomy. Bilateral gonadal veins again. Noted urinary bladder is normal in appearance.  Musculoskeletal: There are no aggressive appearing lytic or blastic lesions noted in the visualized portions of the skeleton.  IMPRESSION: 1. No definite signs of metastatic disease in the chest, abdomen or pelvis. 2. Previously described nonspecific borderline and minimally enlarged retroperitoneal lymph nodes are similar to the prior study. No new lymphadenopathy is identified at this time. 3. Postoperative changes and support apparatus, as above. 4. Small paraumbilical ventral hernia containing multiple loops of small bowel without evidence incarceration or obstruction at this time. 5. Atherosclerosis, including left main and left anterior descending coronary artery disease. Please note that although the presence of coronary artery calcium documents the presence of coronary artery disease, the severity of this disease and any potential stenosis cannot be assessed on this non-gated CT examination. Assessment for potential risk factor modification, dietary therapy or pharmacologic therapy may be warranted, if clinically indicated. 6. Probable multinodular goiter redemonstrated.   Electronically Signed   By: Trudie Reed M.D.   On: 09/26/2013 12:14   Ct Abdomen Pelvis W Contrast  09/26/2013   CLINICAL DATA:  History of colon and ovarian cancer. Follow-up study status post chemotherapy.  EXAM: CT CHEST, ABDOMEN, AND PELVIS WITH CONTRAST  TECHNIQUE: Multidetector CT imaging of the chest, abdomen and pelvis was performed following the standard protocol during bolus administration of intravenous contrast.  CONTRAST:  OMNIPAQUE IOHEXOL 300 MG/ML  SOLN  COMPARISON:  CT of the chest, abdomen and pelvis 06/20/2013.  FINDINGS: CT CHEST FINDINGS  Mediastinum: Heart size is normal. There is no significant pericardial fluid,  thickening or pericardial calcification. There is atherosclerosis of the thoracic aorta, the great vessels of the mediastinum and the coronary arteries, including calcified atherosclerotic plaque in the left main and left anterior descending coronary arteries. No pathologically enlarged mediastinal or hilar lymph nodes. Esophagus is unremarkable in appearance. Left-sided subclavian single-lumen porta cath with tip terminating in the mid superior vena cava. Enlarged and diffusely heterogeneous thyroid gland with some substernal extension, presumably a multinodular goiter (unchanged).  Lungs/Pleura: No acute consolidative airspace disease. No pleural effusions. No suspicious appearing pulmonary nodules or masses are identified. Small cluster of 2-3 mm nodules distributed in a peribronchovascular fashion in the periphery of the right lower lobe is unchanged compared to the prior examination, likely to represent areas of mucoid impaction within terminal bronchioles.  Musculoskeletal: There are no aggressive appearing lytic or blastic lesions noted in the visualized portions of the skeleton.  CT ABDOMEN AND PELVIS FINDINGS  Abdomen/Pelvis: The appearance of the liver, gallbladder, pancreas, spleen, bilateral adrenal glands and bilateral kidneys is unremarkable. No significant volume of ascites. No pneumoperitoneum. No pathologic distention of small bowel. Borderline enlarged left para-aortic  lymph node measuring 10 mm in short axis (image 68 of series 2) is similar to the prior study. Numerous other borderline enlarged retroperitoneal lymph nodes are nonspecific, and similar to the prior examination. No other definite pathologic nodal enlargement is identified at this time. Status post right hemicolectomy. Paraumbilical ventral hernia containing multiple loops of small bowel without evidence of bowel incarceration obstruction at this time. Atherosclerosis throughout the abdominal and pelvic vasculature, without evidence  of aneurysm or dissection. Status post total abdominal hysterectomy and bilateral salpingo-oophorectomy. Bilateral gonadal veins again. Noted urinary bladder is normal in appearance.  Musculoskeletal: There are no aggressive appearing lytic or blastic lesions noted in the visualized portions of the skeleton.  IMPRESSION: 1. No definite signs of metastatic disease in the chest, abdomen or pelvis. 2. Previously described nonspecific borderline and minimally enlarged retroperitoneal lymph nodes are similar to the prior study. No new lymphadenopathy is identified at this time. 3. Postoperative changes and support apparatus, as above. 4. Small paraumbilical ventral hernia containing multiple loops of small bowel without evidence incarceration or obstruction at this time. 5. Atherosclerosis, including left main and left anterior descending coronary artery disease. Please note that although the presence of coronary artery calcium documents the presence of coronary artery disease, the severity of this disease and any potential stenosis cannot be assessed on this non-gated CT examination. Assessment for potential risk factor modification, dietary therapy or pharmacologic therapy may be warranted, if clinically indicated. 6. Probable multinodular goiter redemonstrated.   Electronically Signed   By: Trudie Reed M.D.   On: 09/26/2013 12:14    ASSESSMENT:  #1. Stage IV colon cancer with abdominal metastases, no evidence of disease after 12 cycles of FOLFOX plus Avastin. #2. Diabetes mellitus, type II, non-insulin requiring, controlled. #3. Hypertension, controlled. #4. Nontoxic diffuse goiter.   PLAN:  #1. Avastin 15 mg per kilogram IV today along with Xeloda 1500 mg twice a day for 7 days on and 7 days off taken with food. #2. In the presence of her husband, she was warned not to take the next dose of Xeloda should she develop hand-foot syndrome or diarrhea. #3. Followup in 3 weeks for continuation of  treatment.   All questions were answered. The patient knows to call the clinic with any problems, questions or concerns. We can certainly see the patient much sooner if necessary.   I spent 25 minutes counseling the patient face to face. The total time spent in the appointment was 30 minutes.    Maurilio Lovely, MD 10/02/2013 10:41 AM

## 2013-10-02 NOTE — Patient Instructions (Addendum)
[facility] Discharge Instructions  RECOMMENDATIONS MADE BY THE CONSULTANT AND ANY TEST RESULTS WILL BE SENT TO YOUR REFERRING PHYSICIAN.  EXAM FINDINGS BY THE PHYSICIAN TODAY AND SIGNS OR SYMPTOMS TO REPORT TO CLINIC OR PRIMARY PHYSICIAN:   You have had an excellent response to chemotherapy.   You will now begin maintenance Avastin IV and Xeloda (pill). You will take the Avastin every 21 days here @ the Cancer Center. You will take the Xeloda for 7 days on and then 7 days off for 6 months.   If you develop pain, redness, or burning in your hands/feet. Do not take your next scheduled dose of Xeloda. Then call us and let us know!!!  All of your CT scans have been faxed to [facility].   We will check your cholesterol level @ your next visit. Make sure you don't eat or drink anything after midnight the night before except for water if necessary.    Thank you for choosing [name] to provide your oncology and hematology care.  To afford each patient quality time with our providers, please arrive at least 15 minutes before your scheduled appointment time.  With your help, our goal is to use those 15 minutes to complete the necessary work-up to ensure our physicians have the information they need to help with your evaluation and healthcare recommendations.    Effective January 1st, 2014, we ask that you re-schedule your appointment with our physicians should you arrive 10 or more minutes late for your appointment.  We strive to give you quality time with our providers, and arriving late affects you and other patients whose appointments are after yours.    Again, thank you for choosing [facility]  Our hope is that these requests will decrease the amount of time that you wait before being seen by our physicians.       _____________________________________________________________  Should you have questions after your visit to [facility], please contact our office at [phone] between the hours of 8:30 a.m. and 5:00 p.m.  Voicemails left after 4:30 p.m. will not be returned until the following business day.  For prescription refill requests, have your pharmacy contact our office with your prescription refill request.

## 2013-10-03 ENCOUNTER — Inpatient Hospital Stay (HOSPITAL_COMMUNITY): Payer: BC Managed Care – PPO

## 2013-10-03 ENCOUNTER — Ambulatory Visit (HOSPITAL_COMMUNITY): Payer: BC Managed Care – PPO

## 2013-10-04 ENCOUNTER — Telehealth (HOSPITAL_COMMUNITY): Payer: Self-pay | Admitting: *Deleted

## 2013-10-04 ENCOUNTER — Encounter (HOSPITAL_COMMUNITY): Payer: BC Managed Care – PPO

## 2013-10-04 NOTE — Telephone Encounter (Signed)
I spoke with patient's husband since patient wasn't @ home. I asked how Ellenora was doing taking the Xeloda. He said as far as he knew she was doing fine with it and she hasn't complained about anything being wrong. He said he would have her call us if she had any questions. I reminded him that we would be closed on Thanksgiving and the day after. He said ok.

## 2013-10-23 ENCOUNTER — Encounter (HOSPITAL_BASED_OUTPATIENT_CLINIC_OR_DEPARTMENT_OTHER): Payer: BC Managed Care – PPO

## 2013-10-23 ENCOUNTER — Encounter (HOSPITAL_COMMUNITY): Payer: BC Managed Care – PPO | Attending: Oncology

## 2013-10-23 ENCOUNTER — Encounter (HOSPITAL_COMMUNITY): Payer: Self-pay

## 2013-10-23 VITALS — BP 177/80 | HR 68 | Temp 97.0°F | Resp 18 | Wt 239.0 lb

## 2013-10-23 DIAGNOSIS — C182 Malignant neoplasm of ascending colon: Secondary | ICD-10-CM

## 2013-10-23 DIAGNOSIS — C801 Malignant (primary) neoplasm, unspecified: Secondary | ICD-10-CM | POA: Insufficient documentation

## 2013-10-23 DIAGNOSIS — Z5112 Encounter for antineoplastic immunotherapy: Secondary | ICD-10-CM

## 2013-10-23 DIAGNOSIS — I10 Essential (primary) hypertension: Secondary | ICD-10-CM

## 2013-10-23 DIAGNOSIS — E042 Nontoxic multinodular goiter: Secondary | ICD-10-CM

## 2013-10-23 DIAGNOSIS — D509 Iron deficiency anemia, unspecified: Secondary | ICD-10-CM | POA: Insufficient documentation

## 2013-10-23 DIAGNOSIS — C7960 Secondary malignant neoplasm of unspecified ovary: Secondary | ICD-10-CM

## 2013-10-23 DIAGNOSIS — C189 Malignant neoplasm of colon, unspecified: Secondary | ICD-10-CM

## 2013-10-23 DIAGNOSIS — E119 Type 2 diabetes mellitus without complications: Secondary | ICD-10-CM | POA: Insufficient documentation

## 2013-10-23 DIAGNOSIS — G609 Hereditary and idiopathic neuropathy, unspecified: Secondary | ICD-10-CM | POA: Insufficient documentation

## 2013-10-23 LAB — CBC WITH DIFFERENTIAL/PLATELET
Basophils Absolute: 0 10*3/uL (ref 0.0–0.1)
Basophils Relative: 0 % (ref 0–1)
Eosinophils Absolute: 0.2 10*3/uL (ref 0.0–0.7)
Eosinophils Relative: 3 % (ref 0–5)
Hemoglobin: 13.1 g/dL (ref 12.0–15.0)
MCH: 29.4 pg (ref 26.0–34.0)
MCV: 88.1 fL (ref 78.0–100.0)
Monocytes Relative: 6 % (ref 3–12)
Neutro Abs: 2.3 10*3/uL (ref 1.7–7.7)
Neutrophils Relative %: 39 % — ABNORMAL LOW (ref 43–77)
Platelets: 131 10*3/uL — ABNORMAL LOW (ref 150–400)
RBC: 4.46 MIL/uL (ref 3.87–5.11)
RDW: 14.6 % (ref 11.5–15.5)
WBC: 5.8 10*3/uL (ref 4.0–10.5)

## 2013-10-23 LAB — COMPREHENSIVE METABOLIC PANEL WITH GFR
ALT: 14 U/L (ref 0–35)
AST: 20 U/L (ref 0–37)
Albumin: 3.2 g/dL — ABNORMAL LOW (ref 3.5–5.2)
Alkaline Phosphatase: 59 U/L (ref 39–117)
BUN: 15 mg/dL (ref 6–23)
CO2: 24 meq/L (ref 19–32)
Calcium: 9.1 mg/dL (ref 8.4–10.5)
Chloride: 106 meq/L (ref 96–112)
Creatinine, Ser: 0.6 mg/dL (ref 0.50–1.10)
GFR calc Af Amer: >90 mL/min (ref >90)
GFR calc non Af Amer: >90 mL/min (ref >90)
Glucose, Bld: 229 mg/dL — ABNORMAL HIGH (ref 70–99)
Potassium: 3.7 meq/L (ref 3.5–5.1)
Sodium: 140 meq/L (ref 135–145)
Total Bilirubin: 0.3 mg/dL (ref 0.3–1.2)
Total Protein: 7.1 g/dL (ref 6.0–8.3)

## 2013-10-23 LAB — LIPID PANEL
HDL: 84 mg/dL (ref >39)
LDL Cholesterol: 108 mg/dL — ABNORMAL HIGH (ref 0–99)
Total CHOL/HDL Ratio: 2.5 RATIO
Triglycerides: 107 mg/dL (ref <150)
VLDL: 21 mg/dL (ref 0–40)

## 2013-10-23 LAB — URINALYSIS, DIPSTICK ONLY
Bilirubin Urine: NEGATIVE
Hgb urine dipstick: NEGATIVE
Leukocytes, UA: NEGATIVE
Urobilinogen, UA: 1 mg/dL (ref 0.0–1.0)

## 2013-10-23 MED ORDER — SODIUM CHLORIDE 0.9 % IV SOLN
Freq: Once | INTRAVENOUS | Status: AC
  Administered 2013-10-23: 10:00:00 via INTRAVENOUS

## 2013-10-23 MED ORDER — HEPARIN SOD (PORK) LOCK FLUSH 100 UNIT/ML IV SOLN
500.0000 [IU] | Freq: Once | INTRAVENOUS | Status: AC | PRN
  Administered 2013-10-23: 500 [IU]
  Filled 2013-10-23: qty 5

## 2013-10-23 MED ORDER — SODIUM CHLORIDE 0.9 % IJ SOLN
10.0000 mL | INTRAMUSCULAR | Status: DC | PRN
  Administered 2013-10-23: 10 mL

## 2013-10-23 MED ORDER — SODIUM CHLORIDE 0.9 % IV SOLN
15.0000 mg/kg | Freq: Once | INTRAVENOUS | Status: AC
  Administered 2013-10-23: 1625 mg via INTRAVENOUS
  Filled 2013-10-23: qty 64

## 2013-10-23 NOTE — Progress Notes (Signed)
Tolerated avastin well today.  Sluggish blood return from port.  Flushes easily without discomfort.  Pt c/o "stinging" when drawing back blood.  Dr. Zigmund Daniel consulted.  He confirmed o.k. To use port.  Specimen obtained by peripheral stick for labs.  Denied further discomfort.  Infusion without incident. D/C from clinic accompanied by spouse.

## 2013-10-23 NOTE — Progress Notes (Signed)
[facility]  OFFICE PROGRESS NOTE  Terie Purser, PA-C [address] 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 Colon Cancer    CURRENT THERAPY: Maintenance Avastin and Xeloda started on 10/02/2013 with Xeloda taken 1500 mg twice a day for 7 days on and 7 days off with Avastin given every 3 weeks at 15 mg per kilogram.  INTERVAL HISTORY: Monica Allen 57 y.o. female returns for followup in anticipation of receiving second cycle of maintenance Avastin plus Xeloda orally started on 10/02/2013. CAT scan showed no evidence of disease after the 12 cycles of FOLFOX plus Avastin. She complains of some discomfort in the neck on the side that her Port-A-Cath is placed. She denies any fever, night sweats, chest pain, PND, orthopnea, palpitations, lower extremity swelling or redness, headache, but with slight bulging on the abdominal wall when she strains. Appetite has been good with no nausea, vomiting, or diarrhea. She also denies any episodes of hand-foot syndrome or oral mucositis.  MEDICAL HISTORY: Past Medical History  Diagnosis Date  . Diabetes mellitus   . Hypertension   . Enlarged heart   . Goiter   . Nonischemic cardiomyopathy   . Anemia   . Cancer     right colon- ovarian  . Invasive adenocarcinoma of colon 03/08/2013    11/19 LNs POS, THRU TO ADIPOSE TISSUE    INTERIM HISTORY: has THYROMEGALY; DIABETES MELLITUS, UNCONTROLLED, WITH RENAL COMPLICATIONS; ANXIETY; DEPRESSION; HYPERTENSION; CARDIOMYOPATHY, DILATED; CONGESTIVE HEART FAILURE; SINUSITIS, CHRONIC; ALLERGIC RHINITIS; GERD; ONYCHIA AND PARONYCHIA OF TOE; LOW BACK PAIN; Diabetes mellitus; and Invasive adenocarcinoma of colon on her problem list.   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 Loree Fee on 12/13/2012.  At that time he did TAH and BSO with lysis of adhesions, partial omentectomy, and resection of pelvic peritoneal implant. At the completion of the surgical procedure there is no evidence of residual disease. Her CEA is within the normal range. Her cancer however did not mark as ovarian in origin and a subsequent colonoscopy revealed a right ascending colon cancer. This was discovered by Dr. Raj Janus on 02/14/2013. It was near circumferential.  She underwent resection. She then received 12 treatments with FOLFOX plus Avastin with last treatment given on 09/12/2013. She was started on maintenance Avastin plus Xeloda on 10/02/2013.  ALLERGIES:  is allergic to actos and ibuprofen.  MEDICATIONS: has a current medication list which includes the following prescription(s): aspirin ec, capecitabine, carvedilol, furosemide, glipizide, lidocaine-prilocaine, linagliptin-metformin hcl, lorazepam, ondansetron, potassium chloride sa, ramipril, and valsartan, and the following Facility-Administered Medications: sodium chloride, bevacizumab (AVASTIN) 1,625 mg in sodium chloride 0.9 % 100 mL chemo infusion, heparin lock flush, and sodium chloride.  SURGICAL HISTORY:  Past Surgical History  Procedure Laterality Date  . Wisdom tooth extraction    . Laparotomy  12/13/2012    Procedure: EXPLORATORY LAPAROTOMY;  Surgeon: Jeannette Corpus, MD;  Location: [facility];  Service: Gynecology;  Laterality: N/A;  . Abdominal hysterectomy  12/13/2012    Procedure: HYSTERECTOMY ABDOMINAL;  Surgeon: Jeannette Corpus, MD;  Location: [facility];  Service: Gynecology;  Laterality: N/A;  . Salpingoophorectomy  12/13/2012    Procedure: SALPINGO OOPHORECTOMY;  Surgeon: Jeannette Corpus, MD;  Location: [facility];  Service: Gynecology;  Laterality: Bilateral;  . Omentectomy  12/13/2012  Procedure: OMENTECTOMY;  Surgeon: Jeannette Corpus, MD;  Location: [facility];  Service: Gynecology;  Laterality: N/A;  . Colonoscopy N/A  02/14/2013    Procedure: COLONOSCOPY;  Surgeon: West Bali, MD;  Location: [facility];  Service: Endoscopy;  Laterality: N/A;  2:00 PM-moved to 1115 Kim notified pt  . Partial colectomy N/A 03/01/2013    Procedure: PARTIAL COLECTOMY;  Surgeon: Dalia Heading, MD;  Location: [facility];  Service: General;  Laterality: N/A;  . Portacath placement Left 03/01/2013    Procedure: INSERTION PORT-A-CATH;  Surgeon: Dalia Heading, MD;  Location: [facility];  Service: General;  Laterality: Left;    FAMILY HISTORY: family history includes Diabetes in her maternal grandmother, mother, and sister; Hypertension in her brother, maternal grandmother, mother, and sister; Stroke in her maternal aunt. There is no history of Colon cancer.  SOCIAL HISTORY:  reports that she quit smoking about 25 years ago. Her smoking use included Cigarettes. She has a 37.5 pack-year smoking history. She does not have any smokeless tobacco history on file. She reports that she drinks alcohol. She reports that she does not use illicit drugs.  REVIEW OF SYSTEMS:  Other than that discussed above is noncontributory.  PHYSICAL EXAMINATION: ECOG PERFORMANCE STATUS: 1 - Symptomatic but completely ambulatory  Blood pressure 177/80, pulse 68, temperature 97 F (36.1 C), temperature source Oral, resp. rate 18, weight 239 lb (108.41 kg), last menstrual period 10/07/2012.  GENERAL:alert, no distress and comfortable SKIN: skin color, texture, turgor are normal, no rashes or significant lesions EYES: PERLA; Conjunctiva are pink and non-injected, sclera clear OROPHARYNX:no exudate, no erythema on lips, buccal mucosa, or tongue. NECK: supple, thyroid diffusely enlarged, non-tender, without nodularity. No masses CHEST: Normal AP diameter with no breast masses. LifePort in place with no discomfort on flushing. LYMPH:  no palpable lymphadenopathy in the cervical, axillary or inguinal LUNGS: clear to auscultation and percussion with normal breathing  effort HEART: regular rate & rhythm and no murmurs. ABDOMEN:abdomen soft, non-tender and normal bowel sounds. Small ventral hernia appreciated. No hepatosplenomegaly, ascites, or CVA tenderness. MUSCULOSKELETAL:no cyanosis of digits and no clubbing. Range of motion normal.  NEURO: alert & oriented x 3 with fluent speech, no focal motor/sensory deficits   LABORATORY DATA: Infusion on 10/23/2013  Component Date Value Range Status  . WBC 10/23/2013 5.8  4.0 - 10.5 K/uL Final  . RBC 10/23/2013 4.46  3.87 - 5.11 MIL/uL Final  . Hemoglobin 10/23/2013 13.1  12.0 - 15.0 g/dL Final  . HCT 78/29/5621 39.3  36.0 - 46.0 % Final  . MCV 10/23/2013 88.1  78.0 - 100.0 fL Final  . MCH 10/23/2013 29.4  26.0 - 34.0 pg Final  . MCHC 10/23/2013 33.3  30.0 - 36.0 g/dL Final  . RDW 30/86/5784 14.6  11.5 - 15.5 % Final  . Platelets 10/23/2013 131* 150 - 400 K/uL Final  . Neutrophils Relative % 10/23/2013 39* 43 - 77 % Final  . Neutro Abs 10/23/2013 2.3  1.7 - 7.7 K/uL Final  . Lymphocytes Relative 10/23/2013 52* 12 - 46 % Final  . Lymphs Abs 10/23/2013 3.0  0.7 - 4.0 K/uL Final  . Monocytes Relative 10/23/2013 6  3 - 12 % Final  . Monocytes Absolute 10/23/2013 0.3  0.1 - 1.0 K/uL Final  . Eosinophils Relative 10/23/2013 3  0 - 5 % Final  . Eosinophils Absolute 10/23/2013 0.2  0.0 - 0.7 K/uL Final  . Basophils Relative 10/23/2013 0  0 - 1 % Final  .  Basophils Absolute 10/23/2013 0.0  0.0 - 0.1 K/uL Final  . Sodium 10/23/2013 140  135 - 145 mEq/L Final  . Potassium 10/23/2013 3.7  3.5 - 5.1 mEq/L Final  . Chloride 10/23/2013 106  96 - 112 mEq/L Final  . CO2 10/23/2013 24  19 - 32 mEq/L Final  . Glucose, Bld 10/23/2013 229* 70 - 99 mg/dL Final  . BUN 40/98/1191 15  6 - 23 mg/dL Final  . Creatinine, Ser 10/23/2013 0.60  0.50 - 1.10 mg/dL Final  . Calcium 47/82/9562 9.1  8.4 - 10.5 mg/dL Final  . Total Protein 10/23/2013 7.1  6.0 - 8.3 g/dL Final  . Albumin 13/06/6577 3.2* 3.5 - 5.2 g/dL Final  . AST  46/96/2952 20  0 - 37 U/L Final  . ALT 10/23/2013 14  0 - 35 U/L Final  . Alkaline Phosphatase 10/23/2013 59  39 - 117 U/L Final  . Total Bilirubin 10/23/2013 0.3  0.3 - 1.2 mg/dL Final  . GFR calc non Af Amer 10/23/2013 >90  >90 mL/min Final  . GFR calc Af Amer 10/23/2013 >90  >90 mL/min Final   Comment: (NOTE)                          The eGFR has been calculated using the CKD EPI equation.                          This calculation has not been validated in all clinical situations.                          eGFR's persistently <90 mL/min signify possible Chronic Kidney                          Disease.  Marland Kitchen Specific Gravity, Urine 10/23/2013 >1.030* 1.005 - 1.030 Final  . pH 10/23/2013 6.0  5.0 - 8.0 Final  . Glucose, UA 10/23/2013 NEGATIVE  NEGATIVE mg/dL Final  . Hgb urine dipstick 10/23/2013 NEGATIVE  NEGATIVE Final  . Bilirubin Urine 10/23/2013 NEGATIVE  NEGATIVE Final  . Ketones, ur 10/23/2013 NEGATIVE  NEGATIVE mg/dL Final  . Protein, ur 84/13/2440 TRACE* NEGATIVE mg/dL Final  . Urobilinogen, UA 10/23/2013 1.0  0.0 - 1.0 mg/dL Final  . Nitrite 09/05/2535 NEGATIVE  NEGATIVE Final  . Leukocytes, UA 10/23/2013 NEGATIVE  NEGATIVE Final  Infusion on 10/02/2013  Component Date Value Range Status  . WBC 10/02/2013 4.7  4.0 - 10.5 K/uL Final  . RBC 10/02/2013 4.43  3.87 - 5.11 MIL/uL Final  . Hemoglobin 10/02/2013 12.9  12.0 - 15.0 g/dL Final  . HCT 64/40/3474 38.7  36.0 - 46.0 % Final  . MCV 10/02/2013 87.4  78.0 - 100.0 fL Final  . MCH 10/02/2013 29.1  26.0 - 34.0 pg Final  . MCHC 10/02/2013 33.3  30.0 - 36.0 g/dL Final  . RDW 25/95/6387 14.8  11.5 - 15.5 % Final  . Platelets 10/02/2013 162  150 - 400 K/uL Final  . Neutrophils Relative % 10/02/2013 29* 43 - 77 % Final  . Neutro Abs 10/02/2013 1.4* 1.7 - 7.7 K/uL Final  . Lymphocytes Relative 10/02/2013 58* 12 - 46 % Final  . Lymphs Abs 10/02/2013 2.7  0.7 - 4.0 K/uL Final  . Monocytes Relative 10/02/2013 11  3 - 12 % Final  .  Monocytes Absolute 10/02/2013 0.5  0.1 -  1.0 K/uL Final  . Eosinophils Relative 10/02/2013 2  0 - 5 % Final  . Eosinophils Absolute 10/02/2013 0.1  0.0 - 0.7 K/uL Final  . Basophils Relative 10/02/2013 0  0 - 1 % Final  . Basophils Absolute 10/02/2013 0.0  0.0 - 0.1 K/uL Final  . Sodium 10/02/2013 139  135 - 145 mEq/L Final  . Potassium 10/02/2013 3.5  3.5 - 5.1 mEq/L Final  . Chloride 10/02/2013 102  96 - 112 mEq/L Final  . CO2 10/02/2013 24  19 - 32 mEq/L Final  . Glucose, Bld 10/02/2013 277* 70 - 99 mg/dL Final  . BUN 16/08/9603 11  6 - 23 mg/dL Final  . Creatinine, Ser 10/02/2013 0.47* 0.50 - 1.10 mg/dL Final  . Calcium 54/07/8118 9.2  8.4 - 10.5 mg/dL Final  . Total Protein 10/02/2013 7.0  6.0 - 8.3 g/dL Final  . Albumin 14/78/2956 3.3* 3.5 - 5.2 g/dL Final  . AST 21/30/8657 18  0 - 37 U/L Final  . ALT 10/02/2013 14  0 - 35 U/L Final  . Alkaline Phosphatase 10/02/2013 72  39 - 117 U/L Final  . Total Bilirubin 10/02/2013 0.3  0.3 - 1.2 mg/dL Final  . GFR calc non Af Amer 10/02/2013 >90  >90 mL/min Final  . GFR calc Af Amer 10/02/2013 >90  >90 mL/min Final   Comment: (NOTE)                          The eGFR has been calculated using the CKD EPI equation.                          This calculation has not been validated in all clinical situations.                          eGFR's persistently <90 mL/min signify possible Chronic Kidney                          Disease.  Marland Kitchen Specific Gravity, Urine 10/02/2013 >1.030* 1.005 - 1.030 Final  . pH 10/02/2013 5.5  5.0 - 8.0 Final  . Glucose, UA 10/02/2013 >1000* NEGATIVE mg/dL Final  . Hgb urine dipstick 10/02/2013 NEGATIVE  NEGATIVE Final  . Bilirubin Urine 10/02/2013 NEGATIVE  NEGATIVE Final  . Ketones, ur 10/02/2013 15* NEGATIVE mg/dL Final  . Protein, ur 84/69/6295 30* NEGATIVE mg/dL Final  . Urobilinogen, UA 10/02/2013 0.2  0.0 - 1.0 mg/dL Final  . Nitrite 28/41/3244 NEGATIVE  NEGATIVE Final  . Leukocytes, UA 10/02/2013 NEGATIVE   NEGATIVE Final    PATHOLOGY: No new pathology.  Urinalysis    Component Value Date/Time   COLORURINE YELLOW 10/26/2012 2121   APPEARANCEUR CLEAR 10/26/2012 2121   LABSPEC >1.030* 10/23/2013 0906   PHURINE 6.0 10/23/2013 0906   GLUCOSEU NEGATIVE 10/23/2013 0906   HGBUR NEGATIVE 10/23/2013 0906   BILIRUBINUR NEGATIVE 10/23/2013 0906   KETONESUR NEGATIVE 10/23/2013 0906   PROTEINUR TRACE* 10/23/2013 0906   UROBILINOGEN 1.0 10/23/2013 0906   NITRITE NEGATIVE 10/23/2013 0906   LEUKOCYTESUR NEGATIVE 10/23/2013 0906    RADIOGRAPHIC STUDIES: Ct Chest W Contrast  09/26/2013   CLINICAL DATA:  History of colon and ovarian cancer. Follow-up study status post chemotherapy.  EXAM: CT CHEST, ABDOMEN, AND PELVIS WITH CONTRAST  TECHNIQUE: Multidetector CT imaging of the chest, abdomen and pelvis was performed following the  standard protocol during bolus administration of intravenous contrast.  CONTRAST:  OMNIPAQUE IOHEXOL 300 MG/ML  SOLN  COMPARISON:  CT of the chest, abdomen and pelvis 06/20/2013.  FINDINGS: CT CHEST FINDINGS  Mediastinum: Heart size is normal. There is no significant pericardial fluid, thickening or pericardial calcification. There is atherosclerosis of the thoracic aorta, the great vessels of the mediastinum and the coronary arteries, including calcified atherosclerotic plaque in the left main and left anterior descending coronary arteries. No pathologically enlarged mediastinal or hilar lymph nodes. Esophagus is unremarkable in appearance. Left-sided subclavian single-lumen porta cath with tip terminating in the mid superior vena cava. Enlarged and diffusely heterogeneous thyroid gland with some substernal extension, presumably a multinodular goiter (unchanged).  Lungs/Pleura: No acute consolidative airspace disease. No pleural effusions. No suspicious appearing pulmonary nodules or masses are identified. Small cluster of 2-3 mm nodules distributed in a peribronchovascular fashion  in the periphery of the right lower lobe is unchanged compared to the prior examination, likely to represent areas of mucoid impaction within terminal bronchioles.  Musculoskeletal: There are no aggressive appearing lytic or blastic lesions noted in the visualized portions of the skeleton.  CT ABDOMEN AND PELVIS FINDINGS  Abdomen/Pelvis: The appearance of the liver, gallbladder, pancreas, spleen, bilateral adrenal glands and bilateral kidneys is unremarkable. No significant volume of ascites. No pneumoperitoneum. No pathologic distention of small bowel. Borderline enlarged left para-aortic lymph node measuring 10 mm in short axis (image 68 of series 2) is similar to the prior study. Numerous other borderline enlarged retroperitoneal lymph nodes are nonspecific, and similar to the prior examination. No other definite pathologic nodal enlargement is identified at this time. Status post right hemicolectomy. Paraumbilical ventral hernia containing multiple loops of small bowel without evidence of bowel incarceration obstruction at this time. Atherosclerosis throughout the abdominal and pelvic vasculature, without evidence of aneurysm or dissection. Status post total abdominal hysterectomy and bilateral salpingo-oophorectomy. Bilateral gonadal veins again. Noted urinary bladder is normal in appearance.  Musculoskeletal: There are no aggressive appearing lytic or blastic lesions noted in the visualized portions of the skeleton.  IMPRESSION: 1. No definite signs of metastatic disease in the chest, abdomen or pelvis. 2. Previously described nonspecific borderline and minimally enlarged retroperitoneal lymph nodes are similar to the prior study. No new lymphadenopathy is identified at this time. 3. Postoperative changes and support apparatus, as above. 4. Small paraumbilical ventral hernia containing multiple loops of small bowel without evidence incarceration or obstruction at this time. 5. Atherosclerosis, including left  main and left anterior descending coronary artery disease. Please note that although the presence of coronary artery calcium documents the presence of coronary artery disease, the severity of this disease and any potential stenosis cannot be assessed on this non-gated CT examination. Assessment for potential risk factor modification, dietary therapy or pharmacologic therapy may be warranted, if clinically indicated. 6. Probable multinodular goiter redemonstrated.   Electronically Signed   By: Trudie Reed M.D.   On: 09/26/2013 12:14   Ct Abdomen Pelvis W Contrast  09/26/2013   CLINICAL DATA:  History of colon and ovarian cancer. Follow-up study status post chemotherapy.  EXAM: CT CHEST, ABDOMEN, AND PELVIS WITH CONTRAST  TECHNIQUE: Multidetector CT imaging of the chest, abdomen and pelvis was performed following the standard protocol during bolus administration of intravenous contrast.  CONTRAST:  OMNIPAQUE IOHEXOL 300 MG/ML  SOLN  COMPARISON:  CT of the chest, abdomen and pelvis 06/20/2013.  FINDINGS: CT CHEST FINDINGS  Mediastinum: Heart size is normal. There is  no significant pericardial fluid, thickening or pericardial calcification. There is atherosclerosis of the thoracic aorta, the great vessels of the mediastinum and the coronary arteries, including calcified atherosclerotic plaque in the left main and left anterior descending coronary arteries. No pathologically enlarged mediastinal or hilar lymph nodes. Esophagus is unremarkable in appearance. Left-sided subclavian single-lumen porta cath with tip terminating in the mid superior vena cava. Enlarged and diffusely heterogeneous thyroid gland with some substernal extension, presumably a multinodular goiter (unchanged).  Lungs/Pleura: No acute consolidative airspace disease. No pleural effusions. No suspicious appearing pulmonary nodules or masses are identified. Small cluster of 2-3 mm nodules distributed in a peribronchovascular fashion in the  periphery of the right lower lobe is unchanged compared to the prior examination, likely to represent areas of mucoid impaction within terminal bronchioles.  Musculoskeletal: There are no aggressive appearing lytic or blastic lesions noted in the visualized portions of the skeleton.  CT ABDOMEN AND PELVIS FINDINGS  Abdomen/Pelvis: The appearance of the liver, gallbladder, pancreas, spleen, bilateral adrenal glands and bilateral kidneys is unremarkable. No significant volume of ascites. No pneumoperitoneum. No pathologic distention of small bowel. Borderline enlarged left para-aortic lymph node measuring 10 mm in short axis (image 68 of series 2) is similar to the prior study. Numerous other borderline enlarged retroperitoneal lymph nodes are nonspecific, and similar to the prior examination. No other definite pathologic nodal enlargement is identified at this time. Status post right hemicolectomy. Paraumbilical ventral hernia containing multiple loops of small bowel without evidence of bowel incarceration obstruction at this time. Atherosclerosis throughout the abdominal and pelvic vasculature, without evidence of aneurysm or dissection. Status post total abdominal hysterectomy and bilateral salpingo-oophorectomy. Bilateral gonadal veins again. Noted urinary bladder is normal in appearance.  Musculoskeletal: There are no aggressive appearing lytic or blastic lesions noted in the visualized portions of the skeleton.  IMPRESSION: 1. No definite signs of metastatic disease in the chest, abdomen or pelvis. 2. Previously described nonspecific borderline and minimally enlarged retroperitoneal lymph nodes are similar to the prior study. No new lymphadenopathy is identified at this time. 3. Postoperative changes and support apparatus, as above. 4. Small paraumbilical ventral hernia containing multiple loops of small bowel without evidence incarceration or obstruction at this time. 5. Atherosclerosis, including left main and  left anterior descending coronary artery disease. Please note that although the presence of coronary artery calcium documents the presence of coronary artery disease, the severity of this disease and any potential stenosis cannot be assessed on this non-gated CT examination. Assessment for potential risk factor modification, dietary therapy or pharmacologic therapy may be warranted, if clinically indicated. 6. Probable multinodular goiter redemonstrated.   Electronically Signed   By: Trudie Reed M.D.   On: 09/26/2013 12:14    ASSESSMENT:  #1. Stage IV colon cancer with abdominal metastases, status post 12 cycles of FOLFOX plus Avastin with no evidence of disease on CT scan, for cycle #2 of maintenance Avastin plus Xeloda today. This is an off week for Xeloda which he takes one week on 1 week off. Avastin is given every 3 weeks. #2. Diabetes mellitus, type II, non-insulin requiring, controlled. #3. Hypertension, controlled. #4. Nontoxic diffuse goiter.   PLAN:  #1. Intravenous Avastin at 15 mg per kilogram today. #2. Suggest purchasing abdominal girdle to control ventral hernia. #3. Reassured about status of life port. #4. Followup in 3 weeks for continuation of Avastin, representing cycle #3. She will resume Xeloda 1500 mg twice a day for 7 days in one week.  All questions were answered. The patient knows to call the clinic with any problems, questions or concerns. We can certainly see the patient much sooner if necessary.   I spent 25 minutes counseling the patient face to face. The total time spent in the appointment was 30 minutes.    Maurilio Lovely, MD 10/23/2013 10:28 AM

## 2013-10-24 ENCOUNTER — Telehealth (HOSPITAL_COMMUNITY): Payer: Self-pay | Admitting: *Deleted

## 2013-10-24 NOTE — Telephone Encounter (Signed)
Monica Allen called and said her feet were hurting really bad and she needs something for pain and she uses [facility] in [place].

## 2013-10-25 ENCOUNTER — Encounter (HOSPITAL_BASED_OUTPATIENT_CLINIC_OR_DEPARTMENT_OTHER): Payer: BC Managed Care – PPO | Admitting: Oncology

## 2013-10-25 DIAGNOSIS — G609 Hereditary and idiopathic neuropathy, unspecified: Secondary | ICD-10-CM

## 2013-10-25 DIAGNOSIS — E119 Type 2 diabetes mellitus without complications: Secondary | ICD-10-CM

## 2013-10-25 DIAGNOSIS — C189 Malignant neoplasm of colon, unspecified: Secondary | ICD-10-CM

## 2013-10-25 DIAGNOSIS — G629 Polyneuropathy, unspecified: Secondary | ICD-10-CM

## 2013-10-25 MED ORDER — CAPECITABINE 500 MG PO TABS: 1000.0000 mg | ORAL_TABLET | Freq: Two times a day (BID) | ORAL | Status: DC

## 2013-10-25 MED ORDER — GABAPENTIN 300 MG PO CAPS: ORAL_CAPSULE | ORAL | Status: DC

## 2013-10-25 NOTE — Telephone Encounter (Signed)
Please call patient regarding this complaint.  Did she fall?  Is it peripheral neuropathy from Xeloda?  Any rash or redness of feet.  She should probably be seen before we just give her "pain medication" unless we can determine the cause via telephone.  Per Mazzie: Monica Allen called and said her feet were hurting really bad and she needs something for pain and she uses [facility] in [place].

## 2013-10-25 NOTE — Progress Notes (Signed)
Monica Allen called the clinic yesterday evening requesting pain medication for her B/L feet pain she was experiencing.  I asked a nurse to call her this AM to get details.  I requested her to report to the clinic as a work-in to further evaluate this symptom.  She reports that she has had this symptom prior to initiation of Xeloda.  I suspect she had an element of diabetic neuropathy.  She explains that most recently the discomfort has increased.  Over the past 2 months of labs, her glucose ranged between 229- 285.  She has not been following a diabetic diet and I suspect her increased peripheral neuropathy is multifactorial from Xeloda + poor diabetes control.   I provided the patient education regarding peripheral neuropathy.  There does not seem to be plantar-palmary Syndrome from Xeloda.  She denies any increased pain on deep palpation.  Her tenderness is localized to ball of foot.   Plan: 1. Encourage better control of glucose 2. Recommend diabetic diet 3. Decrease Xeloda to 1000 mg in AM and 1000 mg in PM.  New Rx given to prior authorization specialist 4. Gabapentin 300 mg x 3 days, 600 mg x 3 days, and 900 mg thereafter. 5. Return in 2-3 weeks for follow-up  Patient and plan discussed with Dr. Alla German and he is in agreement with the aforementioned.   Monica Allen

## 2013-10-25 NOTE — Patient Instructions (Signed)
[facility] Discharge Instructions  RECOMMENDATIONS MADE BY THE CONSULTANT AND ANY TEST RESULTS WILL BE SENT TO YOUR REFERRING PHYSICIAN.   MEDICATIONS PRESCRIBED:  Gabapentin 300 mg at bed time x 3 days then 2 capsules at bedtime and then 3 capsules at bedtime. Decrease Xeloda to 1000 mg in AM and PM (2 capsules twice daily) for 7 days on and 7 days off. Recommend Tylenol for foot pain.  INSTRUCTIONS GIVEN AND DISCUSSED: Encourage diabetic diet Work on better glucose control  SPECIAL INSTRUCTIONS/FOLLOW-UP: Return in 2-3 weeks for follow-up.  Thank you for choosing [name] to provide your oncology and hematology care.  To afford each patient quality time with our providers, please arrive at least 15 minutes before your scheduled appointment time.  With your help, our goal is to use those 15 minutes to complete the necessary work-up to ensure our physicians have the information they need to help with your evaluation and healthcare recommendations.    Effective January 1st, 2014, we ask that you re-schedule your appointment with our physicians should you arrive 10 or more minutes late for your appointment.  We strive to give you quality time with our providers, and arriving late affects you and other patients whose appointments are after yours.    Again, thank you for choosing [facility].  Our hope is that these requests will decrease the amount of time that you wait before being seen by our physicians.       _____________________________________________________________  Should you have questions after your visit to [facility], please contact our office at [phone] between the hours of 8:30 a.m. and 5:00 p.m.  Voicemails left after 4:30 p.m. will not be returned until the following business day.  For prescription refill requests, have your pharmacy contact our office with your prescription refill request.

## 2013-10-25 NOTE — Telephone Encounter (Signed)
Spoke with pt. She will come in for prn visit.

## 2013-11-13 ENCOUNTER — Encounter (HOSPITAL_COMMUNITY): Payer: BC Managed Care – PPO | Attending: Oncology

## 2013-11-13 ENCOUNTER — Encounter (HOSPITAL_BASED_OUTPATIENT_CLINIC_OR_DEPARTMENT_OTHER): Payer: BC Managed Care – PPO

## 2013-11-13 DIAGNOSIS — E785 Hyperlipidemia, unspecified: Secondary | ICD-10-CM

## 2013-11-13 DIAGNOSIS — E119 Type 2 diabetes mellitus without complications: Secondary | ICD-10-CM

## 2013-11-13 DIAGNOSIS — E049 Nontoxic goiter, unspecified: Secondary | ICD-10-CM

## 2013-11-13 DIAGNOSIS — C182 Malignant neoplasm of ascending colon: Secondary | ICD-10-CM

## 2013-11-13 DIAGNOSIS — C189 Malignant neoplasm of colon, unspecified: Secondary | ICD-10-CM | POA: Insufficient documentation

## 2013-11-13 DIAGNOSIS — C50919 Malignant neoplasm of unspecified site of unspecified female breast: Secondary | ICD-10-CM

## 2013-11-13 DIAGNOSIS — C779 Secondary and unspecified malignant neoplasm of lymph node, unspecified: Secondary | ICD-10-CM

## 2013-11-13 DIAGNOSIS — G609 Hereditary and idiopathic neuropathy, unspecified: Secondary | ICD-10-CM

## 2013-11-13 DIAGNOSIS — I10 Essential (primary) hypertension: Secondary | ICD-10-CM

## 2013-11-13 DIAGNOSIS — Z5112 Encounter for antineoplastic immunotherapy: Secondary | ICD-10-CM

## 2013-11-13 LAB — CBC WITH DIFFERENTIAL/PLATELET
BASOS PCT: 0 % (ref 0–1)
Basophils Absolute: 0 10*3/uL (ref 0.0–0.1)
Eosinophils Absolute: 0.2 10*3/uL (ref 0.0–0.7)
Eosinophils Relative: 3 % (ref 0–5)
HEMATOCRIT: 40.8 % (ref 36.0–46.0)
HEMOGLOBIN: 13.6 g/dL (ref 12.0–15.0)
LYMPHS ABS: 2.9 10*3/uL (ref 0.7–4.0)
Lymphocytes Relative: 49 % — ABNORMAL HIGH (ref 12–46)
MCH: 29 pg (ref 26.0–34.0)
MCHC: 33.3 g/dL (ref 30.0–36.0)
MCV: 87 fL (ref 78.0–100.0)
MONO ABS: 0.4 10*3/uL (ref 0.1–1.0)
MONOS PCT: 6 % (ref 3–12)
NEUTROS PCT: 42 % — AB (ref 43–77)
Neutro Abs: 2.5 10*3/uL (ref 1.7–7.7)
Platelets: 178 10*3/uL (ref 150–400)
RBC: 4.69 MIL/uL (ref 3.87–5.11)
RDW: 14 % (ref 11.5–15.5)
WBC: 6 10*3/uL (ref 4.0–10.5)

## 2013-11-13 LAB — COMPREHENSIVE METABOLIC PANEL
ALT: 15 U/L (ref 0–35)
AST: 18 U/L (ref 0–37)
Albumin: 3.4 g/dL — ABNORMAL LOW (ref 3.5–5.2)
Alkaline Phosphatase: 69 U/L (ref 39–117)
BILIRUBIN TOTAL: 0.3 mg/dL (ref 0.3–1.2)
BUN: 15 mg/dL (ref 6–23)
CHLORIDE: 102 meq/L (ref 96–112)
CO2: 26 mEq/L (ref 19–32)
CREATININE: 0.63 mg/dL (ref 0.50–1.10)
Calcium: 9.5 mg/dL (ref 8.4–10.5)
GFR calc Af Amer: >90 mL/min (ref >90)
GFR calc non Af Amer: >90 mL/min (ref >90)
GLUCOSE: 184 mg/dL — AB (ref 70–99)
POTASSIUM: 3.7 meq/L (ref 3.7–5.3)
Sodium: 142 mEq/L (ref 137–147)
Total Protein: 7.4 g/dL (ref 6.0–8.3)

## 2013-11-13 LAB — URINALYSIS, DIPSTICK ONLY
Bilirubin Urine: NEGATIVE
Glucose, UA: NEGATIVE mg/dL
Hgb urine dipstick: NEGATIVE
Ketones, ur: NEGATIVE mg/dL
Leukocytes, UA: NEGATIVE
Nitrite: NEGATIVE
Protein, ur: 100 mg/dL — AB
UROBILINOGEN UA: 1 mg/dL (ref 0.0–1.0)
pH: 6 (ref 5.0–8.0)

## 2013-11-13 LAB — LIPID PANEL
CHOLESTEROL: 212 mg/dL — AB (ref 0–200)
HDL: 95 mg/dL (ref >39)
LDL CALC: 94 mg/dL (ref 0–99)
Total CHOL/HDL Ratio: 2.2 RATIO
Triglycerides: 113 mg/dL (ref <150)
VLDL: 23 mg/dL (ref 0–40)

## 2013-11-13 MED ORDER — SODIUM CHLORIDE 0.9 % IV SOLN
Freq: Once | INTRAVENOUS | Status: AC
  Administered 2013-11-13: 500 mL via INTRAVENOUS

## 2013-11-13 MED ORDER — HEPARIN SOD (PORK) LOCK FLUSH 100 UNIT/ML IV SOLN
INTRAVENOUS | Status: AC
  Filled 2013-11-13: qty 5

## 2013-11-13 MED ORDER — SODIUM CHLORIDE 0.9 % IJ SOLN
10.0000 mL | INTRAMUSCULAR | Status: DC | PRN
  Administered 2013-11-13: 10 mL

## 2013-11-13 MED ORDER — SODIUM CHLORIDE 0.9 % IV SOLN
15.0000 mg/kg | Freq: Once | INTRAVENOUS | Status: AC
  Administered 2013-11-13: 1625 mg via INTRAVENOUS
  Filled 2013-11-13: qty 4

## 2013-11-13 MED ORDER — HEPARIN SOD (PORK) LOCK FLUSH 100 UNIT/ML IV SOLN
500.0000 [IU] | Freq: Once | INTRAVENOUS | Status: AC | PRN
  Administered 2013-11-13: 500 [IU]

## 2013-11-13 NOTE — Progress Notes (Signed)
Monica Allen seen first by MD; tolerated infusion well and without incident; verbalizes understanding for follow-up.  No distress noted at time of discharge and patient was discharged home with her husband.

## 2013-11-13 NOTE — Progress Notes (Signed)
Labs drawn today for cbc/diff,cmp,lipid

## 2013-11-13 NOTE — Progress Notes (Signed)
[place]  OFFICE PROGRESS NOTE  [name], PA-C [address] [place] [address]  DIAGNOSIS: Carcinoma of colon metastatic to multiple sites  Hyperlipidemia - Plan: Lipid panel  No chief complaint on file.   CURRENT THERAPY: Maintenance Avastin and Xeloda started on 10/02/2013 currently taking 1000 mg twice a day for 7 days on and 7 days off with Avastin being given every 3 weeks at 15 mg per kilogram. Dosage of Xeloda was decreased because of development of hand-foot syndrome.  INTERVAL HISTORY: Monica Allen 58 y.o. female returns for continuation of maintenance therapy with Avastin and Xeloda for stage IV colon cancer with development of peripheral neuropathy at the time of her last cycle with decrease in Xeloda dose to 1000 mg twice a day and the institution of therapy with gabapentin. She had previously been on FOLFOX plus Avastin for 12 cycles. She received her Xeloda late in the mail and will only start her next cycle today. She was started on gabapentin 300 mg 3 times a day with some improvement in neuropathy involving her feet. She denies any nausea, vomiting, sore throat, abdominal pain, fever, night sweats, lower extremity swelling or redness, skin rash, chest pain, headache, or seizures.  MEDICAL HISTORY: Past Medical History  Diagnosis Date  . Diabetes mellitus   . Hypertension   . Enlarged heart   . Goiter   . Nonischemic cardiomyopathy   . Anemia   . Cancer     right colon- ovarian  . Invasive adenocarcinoma of colon 03/08/2013    11/19 LNs POS, THRU TO ADIPOSE TISSUE    INTERIM HISTORY: has THYROMEGALY; DIABETES MELLITUS, UNCONTROLLED, WITH RENAL COMPLICATIONS; ANXIETY; DEPRESSION; HYPERTENSION; CARDIOMYOPATHY, DILATED; CONGESTIVE HEART FAILURE; SINUSITIS, CHRONIC; ALLERGIC RHINITIS; GERD; ONYCHIA AND PARONYCHIA OF TOE; LOW BACK PAIN; Diabetes mellitus; and Invasive adenocarcinoma of colon on her  problem list.   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 Aldean Ast on 12/13/2012. At that time he did TAH and BSO with lysis of adhesions, partial omentectomy, and resection of pelvic peritoneal implant. At the completion of the surgical procedure there is no evidence of residual disease. Her CEA is within the normal range. Her cancer however did not mark as ovarian in origin and a subsequent colonoscopy revealed a right ascending colon cancer. This was discovered by Dr. Trinda Pascal on 02/14/2013. It was near circumferential.  She underwent resection. She then received 12 treatments with FOLFOX plus Avastin with last treatment given on 09/12/2013. She was started on maintenance Avastin plus Xeloda on 10/02/2013  ALLERGIES:  is allergic to actos and ibuprofen.  MEDICATIONS: has a current medication list which includes the following prescription(s): aspirin ec, capecitabine, carvedilol, furosemide, gabapentin, glipizide, lidocaine-prilocaine, linagliptin-metformin hcl, lorazepam, ondansetron, potassium chloride sa, ramipril, and valsartan, and the following Facility-Administered Medications: bevacizumab (AVASTIN) 1,625 mg in sodium chloride 0.9 % 100 mL chemo infusion, heparin lock flush, and sodium chloride.  SURGICAL HISTORY:  Past Surgical History  Procedure Laterality Date  . Wisdom tooth extraction    . Laparotomy  12/13/2012    Procedure: EXPLORATORY LAPAROTOMY;  Surgeon: Alvino Chapel, MD;  Location: [facility];  Service: Gynecology;  Laterality: N/A;  . Abdominal hysterectomy  12/13/2012    Procedure: HYSTERECTOMY ABDOMINAL;  Surgeon: Alvino Chapel, MD;  Location: [facility];  Service: Gynecology;  Laterality: N/A;  . Salpingoophorectomy  12/13/2012  Procedure: SALPINGO OOPHORECTOMY;  Surgeon: Alvino Chapel, MD;  Location: [facility];  Service: Gynecology;  Laterality: Bilateral;  . Omentectomy   12/13/2012    Procedure: OMENTECTOMY;  Surgeon: Alvino Chapel, MD;  Location: [facility];  Service: Gynecology;  Laterality: N/A;  . Colonoscopy N/A 02/14/2013    Procedure: COLONOSCOPY;  Surgeon: Danie Binder, MD;  Location: [facility];  Service: Endoscopy;  Laterality: N/A;  2:00 PM-moved to [place] notified pt  . Partial colectomy N/A 03/01/2013    Procedure: PARTIAL COLECTOMY;  Surgeon: Jamesetta So, MD;  Location: [facility];  Service: General;  Laterality: N/A;  . Portacath placement Left 03/01/2013    Procedure: INSERTION PORT-A-CATH;  Surgeon: Jamesetta So, MD;  Location: [facility];  Service: General;  Laterality: Left;    FAMILY HISTORY: family history includes Diabetes in her maternal grandmother, mother, and sister; Hypertension in her brother, maternal grandmother, mother, and sister; Stroke in her maternal aunt. There is no history of Colon cancer.  SOCIAL HISTORY:  reports that she quit smoking about 25 years ago. Her smoking use included Cigarettes. She has a 37.5 pack-year smoking history. She does not have any smokeless tobacco history on file. She reports that she drinks alcohol. She reports that she does not use illicit drugs.  REVIEW OF SYSTEMS:  Other than that discussed above is noncontributory.  PHYSICAL EXAMINATION: ECOG PERFORMANCE STATUS: 1 - Symptomatic but completely ambulatory  Last menstrual period 10/07/2012.  GENERAL:alert, no distress and comfortable SKIN: skin color, texture, turgor are normal, no rashes or significant lesions EYES: PERLA; Conjunctiva are pink and non-injected, sclera clear OROPHARYNX:no exudate, no erythema on lips, buccal mucosa, or tongue. NECK: supple, thyroid normal size, non-tender, without nodularity. No masses. Diffusely enlarged thyroid gland it is nontender. CHEST: Normal AP diameter with no breast masses. LifePort in place. LYMPH:  no palpable lymphadenopathy in the cervical, axillary or inguinal LUNGS: clear to auscultation  and percussion with normal breathing effort HEART: regular rate & rhythm and no murmurs. ABDOMEN:abdomen soft, non-tender and normal bowel sounds. Obese with small ventral hernia with that is easily reducible. No free fluid wave or shifting dullness. MUSCULOSKELETAL:no cyanosis of digits and no clubbing. Range of motion normal.  NEURO: alert & oriented x 3 with fluent speech, no focal motor/sensory deficits   LABORATORY DATA: Infusion on 11/13/2013  Component Date Value Range Status  . Specific Gravity, Urine 11/13/2013 >1.030* 1.005 - 1.030 Final  . pH 11/13/2013 6.0  5.0 - 8.0 Final  . Glucose, UA 11/13/2013 NEGATIVE  NEGATIVE mg/dL Final  . Hgb urine dipstick 11/13/2013 NEGATIVE  NEGATIVE Final  . Bilirubin Urine 11/13/2013 NEGATIVE  NEGATIVE Final  . Ketones, ur 11/13/2013 NEGATIVE  NEGATIVE mg/dL Final  . Protein, ur 11/13/2013 100* NEGATIVE mg/dL Final  . Urobilinogen, UA 11/13/2013 1.0  0.0 - 1.0 mg/dL Final  . Nitrite 11/13/2013 NEGATIVE  NEGATIVE Final  . Leukocytes, UA 11/13/2013 NEGATIVE  NEGATIVE Final  Infusion on 10/23/2013  Component Date Value Range Status  . WBC 10/23/2013 5.8  4.0 - 10.5 K/uL Final  . RBC 10/23/2013 4.46  3.87 - 5.11 MIL/uL Final  . Hemoglobin 10/23/2013 13.1  12.0 - 15.0 g/dL Final  . HCT 10/23/2013 39.3  36.0 - 46.0 % Final  . MCV 10/23/2013 88.1  78.0 - 100.0 fL Final  . MCH 10/23/2013 29.4  26.0 - 34.0 pg Final  . MCHC 10/23/2013 33.3  30.0 - 36.0 g/dL Final  . RDW 10/23/2013 14.6  11.5 - 15.5 % Final  . Platelets 10/23/2013 131* 150 - 400 K/uL Final  . Neutrophils Relative % 10/23/2013 39* 43 - 77 % Final  . Neutro Abs 10/23/2013 2.3  1.7 - 7.7 K/uL Final  . Lymphocytes Relative 10/23/2013 52* 12 - 46 % Final  . Lymphs Abs 10/23/2013 3.0  0.7 - 4.0 K/uL Final  . Monocytes Relative 10/23/2013 6  3 - 12 % Final  . Monocytes Absolute 10/23/2013 0.3  0.1 - 1.0 K/uL Final  . Eosinophils Relative 10/23/2013 3  0 - 5 % Final  . Eosinophils  Absolute 10/23/2013 0.2  0.0 - 0.7 K/uL Final  . Basophils Relative 10/23/2013 0  0 - 1 % Final  . Basophils Absolute 10/23/2013 0.0  0.0 - 0.1 K/uL Final  . Sodium 10/23/2013 140  135 - 145 mEq/L Final  . Potassium 10/23/2013 3.7  3.5 - 5.1 mEq/L Final  . Chloride 10/23/2013 106  96 - 112 mEq/L Final  . CO2 10/23/2013 24  19 - 32 mEq/L Final  . Glucose, Bld 10/23/2013 229* 70 - 99 mg/dL Final  . BUN 10/23/2013 15  6 - 23 mg/dL Final  . Creatinine, Ser 10/23/2013 0.60  0.50 - 1.10 mg/dL Final  . Calcium 10/23/2013 9.1  8.4 - 10.5 mg/dL Final  . Total Protein 10/23/2013 7.1  6.0 - 8.3 g/dL Final  . Albumin 10/23/2013 3.2* 3.5 - 5.2 g/dL Final  . AST 10/23/2013 20  0 - 37 U/L Final  . ALT 10/23/2013 14  0 - 35 U/L Final  . Alkaline Phosphatase 10/23/2013 59  39 - 117 U/L Final  . Total Bilirubin 10/23/2013 0.3  0.3 - 1.2 mg/dL Final  . GFR calc non Af Amer 10/23/2013 >90  >90 mL/min Final  . GFR calc Af Amer 10/23/2013 >90  >90 mL/min Final   Comment: (NOTE)                          The eGFR has been calculated using the CKD EPI equation.                          This calculation has not been validated in all clinical situations.                          eGFR's persistently <90 mL/min signify possible Chronic Kidney                          Disease.  . CEA 10/23/2013 2.7  0.0 - 5.0 ng/mL Final   Performed at [facility]  . Cholesterol 10/23/2013 213* 0 - 200 mg/dL Final  . Triglycerides 10/23/2013 107  <150 mg/dL Final  . HDL 10/23/2013 84  >39 mg/dL Final  . Total CHOL/HDL Ratio 10/23/2013 2.5   Final  . VLDL 10/23/2013 21  0 - 40 mg/dL Final  . LDL Cholesterol 10/23/2013 108* 0 - 99 mg/dL Final   Comment:                                 Total Cholesterol/HDL:CHD Risk                          Coronary Heart Disease Risk Table  Men   Women                           1/2 Average Risk   3.4   3.3                           Average  Risk       5.0   4.4                           2 X Average Risk   9.6   7.1                           3 X Average Risk  23.4   11.0                                                          Use the calculated Patient Ratio                          above and the CHD Risk Table                          to determine the patient's CHD Risk.                                                          ATP III CLASSIFICATION (LDL):                           <100     mg/dL   Optimal                           100-129  mg/dL   Near or Above                                             Optimal                           130-159  mg/dL   Borderline                           160-189  mg/dL   High                           >190     mg/dL   Very High  . Specific Gravity, Urine 10/23/2013 >1.030* 1.005 - 1.030 Final  . pH 10/23/2013 6.0  5.0 - 8.0 Final  . Glucose, UA 10/23/2013 NEGATIVE  NEGATIVE mg/dL Final  . Hgb urine dipstick 10/23/2013 NEGATIVE  NEGATIVE Final  . Bilirubin Urine 10/23/2013 NEGATIVE  NEGATIVE Final  .  Ketones, ur 10/23/2013 NEGATIVE  NEGATIVE mg/dL Final  . Protein, ur 10/23/2013 TRACE* NEGATIVE mg/dL Final  . Urobilinogen, UA 10/23/2013 1.0  0.0 - 1.0 mg/dL Final  . Nitrite 10/23/2013 NEGATIVE  NEGATIVE Final  . Leukocytes, UA 10/23/2013 NEGATIVE  NEGATIVE Final    PATHOLOGY: No new pathology.  Urinalysis    Component Value Date/Time   COLORURINE YELLOW 10/26/2012 2121   APPEARANCEUR CLEAR 10/26/2012 2121   LABSPEC >1.030* 11/13/2013 0930   PHURINE 6.0 11/13/2013 0930   GLUCOSEU NEGATIVE 11/13/2013 0930   HGBUR NEGATIVE 11/13/2013 0930   BILIRUBINUR NEGATIVE 11/13/2013 0930   KETONESUR NEGATIVE 11/13/2013 0930   PROTEINUR 100* 11/13/2013 0930   UROBILINOGEN 1.0 11/13/2013 0930   NITRITE NEGATIVE 11/13/2013 0930   LEUKOCYTESUR NEGATIVE 11/13/2013 0930    RADIOGRAPHIC STUDIES: CT scan 09/26/2013 , abdomen and pelvis was  performed following the standard protocol during bolus    administration of intravenous contrast.  CONTRAST: 187mL OMNIPAQUE IOHEXOL 300 MG/ML SOLN  COMPARISON: CT of the chest, abdomen and pelvis 06/20/2013.  FINDINGS:  CT CHEST FINDINGS  Mediastinum: Heart size is normal. There is no significant  pericardial fluid, thickening or pericardial calcification. There is  atherosclerosis of the thoracic aorta, the great vessels of the  mediastinum and the coronary arteries, including calcified  atherosclerotic plaque in the left main and left anterior descending  coronary arteries. No pathologically enlarged mediastinal or hilar  lymph nodes. Esophagus is unremarkable in appearance. Left-sided  subclavian single-lumen porta cath with tip terminating in the mid  superior vena cava. Enlarged and diffusely heterogeneous thyroid  gland with some substernal extension, presumably a multinodular  goiter (unchanged).  Lungs/Pleura: No acute consolidative airspace disease. No pleural  effusions. No suspicious appearing pulmonary nodules or masses are  identified. Small cluster of 2-3 mm nodules distributed in a  peribronchovascular fashion in the periphery of the right lower lobe  is unchanged compared to the prior examination, likely to represent  areas of mucoid impaction within terminal bronchioles.  Musculoskeletal: There are no aggressive appearing lytic or blastic  lesions noted in the visualized portions of the skeleton.  CT ABDOMEN AND PELVIS FINDINGS  Abdomen/Pelvis: The appearance of the liver, gallbladder, pancreas,  spleen, bilateral adrenal glands and bilateral kidneys is  unremarkable. No significant volume of ascites. No pneumoperitoneum.  No pathologic distention of small bowel. Borderline enlarged left  para-aortic lymph node measuring 10 mm in short axis (image 68 of  series 2) is similar to the prior study. Numerous other borderline  enlarged retroperitoneal lymph nodes are nonspecific, and similar to  the prior examination. No other  definite pathologic nodal  enlargement is identified at this time. Status post right  hemicolectomy. Paraumbilical ventral hernia containing multiple  loops of small bowel without evidence of bowel incarceration  obstruction at this time. Atherosclerosis throughout the abdominal  and pelvic vasculature, without evidence of aneurysm or dissection.  Status post total abdominal hysterectomy and bilateral  salpingo-oophorectomy. Bilateral gonadal veins again. Noted urinary  bladder is normal in appearance.  Musculoskeletal: There are no aggressive appearing lytic or blastic  lesions noted in the visualized portions of the skeleton.  IMPRESSION:  1. No definite signs of metastatic disease in the chest, abdomen or  pelvis.  2. Previously described nonspecific borderline and minimally  enlarged retroperitoneal lymph nodes are similar to the prior study.  No new lymphadenopathy is identified at this time.  3. Postoperative changes and support apparatus, as above.  4. Small paraumbilical ventral hernia  containing multiple loops of  small bowel without evidence incarceration or obstruction at this  time.  5. Atherosclerosis, including left main and left anterior descending  coronary artery disease. Please note that although the presence of  coronary artery calcium documents the presence of coronary artery  disease, the severity of this disease and any potential stenosis  cannot be assessed on this non-gated CT examination. Assessment for  potential risk factor modification, dietary therapy or pharmacologic  therapy may be warranted, if clinically indicated.  6. Probable multinodular goiter redemonstrated.  Electronically Signed  By: Vinnie Langton M.D.  On: 09/26/2013 12:14    ASSESSMENT:  #1.Stage IV colon cancer with abdominal metastases, status post 12 cycles of FOLFOX plus Avastin with no evidence of disease on CT scan, for cycle #2 of maintenance Avastin plus Xeloda today. This is an  off week for Xeloda which he takes one week on 1 week off. Avastin is given every 3 weeks.  #2. Diabetes mellitus, type II, non-insulin requiring, controlled.  #3. Hypertension, controlled.  #4. Nontoxic diffuse goiter. #5. Worsening peripheral neuropathy.    PLAN:  #1. Avastin intravenously with resumption of oral Xeloda at 1000 mg twice a day for 7 days on and 7 days off beginning this evening. #2. Continue gabapentin 300 mg 3 times a day. #3. Return in 3 weeks at which time repeat CT scan will be scheduled for assessment of disease status.   All questions were answered. The patient knows to call the clinic with any problems, questions or concerns. We can certainly see the patient much sooner if necessary.   I spent 25 minutes counseling the patient face to face. The total time spent in the appointment was 30 minutes.    Doroteo Bradford, MD 11/13/2013 10:35 AM

## 2013-11-15 MED ORDER — DIPHENHYDRAMINE HCL 25 MG PO CAPS
ORAL_CAPSULE | ORAL | Status: AC
  Filled 2013-11-15: qty 2

## 2013-11-15 MED ORDER — ACETAMINOPHEN 325 MG PO TABS
ORAL_TABLET | ORAL | Status: AC
  Filled 2013-11-15: qty 2

## 2013-12-04 ENCOUNTER — Encounter (HOSPITAL_BASED_OUTPATIENT_CLINIC_OR_DEPARTMENT_OTHER): Payer: BC Managed Care – PPO

## 2013-12-04 ENCOUNTER — Encounter (HOSPITAL_COMMUNITY): Payer: BC Managed Care – PPO

## 2013-12-04 ENCOUNTER — Other Ambulatory Visit (HOSPITAL_COMMUNITY): Payer: Self-pay | Admitting: Hematology and Oncology

## 2013-12-04 VITALS — BP 191/97 | HR 76 | Temp 98.3°F | Resp 20 | Wt 245.6 lb

## 2013-12-04 DIAGNOSIS — C787 Secondary malignant neoplasm of liver and intrahepatic bile duct: Secondary | ICD-10-CM

## 2013-12-04 DIAGNOSIS — G609 Hereditary and idiopathic neuropathy, unspecified: Secondary | ICD-10-CM

## 2013-12-04 DIAGNOSIS — C189 Malignant neoplasm of colon, unspecified: Secondary | ICD-10-CM

## 2013-12-04 DIAGNOSIS — C182 Malignant neoplasm of ascending colon: Secondary | ICD-10-CM

## 2013-12-04 DIAGNOSIS — E119 Type 2 diabetes mellitus without complications: Secondary | ICD-10-CM

## 2013-12-04 DIAGNOSIS — I10 Essential (primary) hypertension: Secondary | ICD-10-CM

## 2013-12-04 DIAGNOSIS — Z5112 Encounter for antineoplastic immunotherapy: Secondary | ICD-10-CM

## 2013-12-04 LAB — COMPREHENSIVE METABOLIC PANEL
ALT: 12 U/L (ref 0–35)
AST: 16 U/L (ref 0–37)
Albumin: 3.2 g/dL — ABNORMAL LOW (ref 3.5–5.2)
Alkaline Phosphatase: 66 U/L (ref 39–117)
BILIRUBIN TOTAL: 0.2 mg/dL — AB (ref 0.3–1.2)
BUN: 18 mg/dL (ref 6–23)
CALCIUM: 9.1 mg/dL (ref 8.4–10.5)
CHLORIDE: 105 meq/L (ref 96–112)
CO2: 22 meq/L (ref 19–32)
Creatinine, Ser: 0.64 mg/dL (ref 0.50–1.10)
GFR calc Af Amer: >90 mL/min (ref >90)
Glucose, Bld: 208 mg/dL — ABNORMAL HIGH (ref 70–99)
Potassium: 4.3 mEq/L (ref 3.7–5.3)
Sodium: 140 mEq/L (ref 137–147)
Total Protein: 7.2 g/dL (ref 6.0–8.3)

## 2013-12-04 LAB — CBC WITH DIFFERENTIAL/PLATELET
Basophils Absolute: 0 10*3/uL (ref 0.0–0.1)
Basophils Relative: 0 % (ref 0–1)
EOS PCT: 3 % (ref 0–5)
Eosinophils Absolute: 0.2 10*3/uL (ref 0.0–0.7)
HEMATOCRIT: 40.3 % (ref 36.0–46.0)
HEMOGLOBIN: 13.6 g/dL (ref 12.0–15.0)
Lymphocytes Relative: 50 % — ABNORMAL HIGH (ref 12–46)
Lymphs Abs: 3.3 10*3/uL (ref 0.7–4.0)
MCH: 29.1 pg (ref 26.0–34.0)
MCHC: 33.7 g/dL (ref 30.0–36.0)
MCV: 86.1 fL (ref 78.0–100.0)
MONOS PCT: 7 % (ref 3–12)
Monocytes Absolute: 0.5 10*3/uL (ref 0.1–1.0)
NEUTROS ABS: 2.6 10*3/uL (ref 1.7–7.7)
Neutrophils Relative %: 40 % — ABNORMAL LOW (ref 43–77)
Platelets: 180 10*3/uL (ref 150–400)
RBC: 4.68 MIL/uL (ref 3.87–5.11)
RDW: 13.5 % (ref 11.5–15.5)
WBC: 6.6 10*3/uL (ref 4.0–10.5)

## 2013-12-04 LAB — URINALYSIS, DIPSTICK ONLY
BILIRUBIN URINE: NEGATIVE
Glucose, UA: NEGATIVE mg/dL
HGB URINE DIPSTICK: NEGATIVE
Ketones, ur: NEGATIVE mg/dL
Leukocytes, UA: NEGATIVE
Nitrite: NEGATIVE
PH: 5.5 (ref 5.0–8.0)
Protein, ur: 100 mg/dL — AB
Specific Gravity, Urine: >1.03 — ABNORMAL HIGH (ref 1.005–1.030)
Urobilinogen, UA: 0.2 mg/dL (ref 0.0–1.0)

## 2013-12-04 LAB — CEA: CEA: 2.8 ng/mL (ref 0.0–5.0)

## 2013-12-04 MED ORDER — SODIUM CHLORIDE 0.9 % IV SOLN
15.0000 mg/kg | Freq: Once | INTRAVENOUS | Status: AC
  Administered 2013-12-04: 1625 mg via INTRAVENOUS
  Filled 2013-12-04: qty 65

## 2013-12-04 MED ORDER — SODIUM CHLORIDE 0.9 % IJ SOLN: 10.0000 mL | INTRAMUSCULAR | Status: DC | PRN

## 2013-12-04 MED ORDER — SODIUM CHLORIDE 0.9 % IV SOLN
Freq: Once | INTRAVENOUS | Status: AC
  Administered 2013-12-04: 10:00:00 via INTRAVENOUS

## 2013-12-04 MED ORDER — HEPARIN SOD (PORK) LOCK FLUSH 100 UNIT/ML IV SOLN
500.0000 [IU] | Freq: Once | INTRAVENOUS | Status: AC | PRN
  Administered 2013-12-04: 500 [IU]
  Filled 2013-12-04: qty 5

## 2013-12-04 NOTE — Patient Instructions (Addendum)
.  [facility] Discharge Instructions  RECOMMENDATIONS MADE BY THE CONSULTANT AND ANY TEST RESULTS WILL BE SENT TO YOUR REFERRING PHYSICIAN.  EXAM FINDINGS BY THE PHYSICIAN TODAY AND SIGNS OR SYMPTOMS TO REPORT TO CLINIC OR PRIMARY PHYSICIAN: Exam and findings as discussed by Dr. Barnet Glasgow.   INSTRUCTIONS/FOLLOW-UP:  CT on or about 2/10 please see Mazzie on your way out for scheduling.  Thank you for choosing [facility] to provide your oncology and hematology care.  To afford each patient quality time with our providers, please arrive at least 15 minutes before your scheduled appointment time.  With your help, our goal is to use those 15 minutes to complete the necessary work-up to ensure our physicians have the information they need to help with your evaluation and healthcare recommendations.    Effective January 1st, 2014, we ask that you re-schedule your appointment with our physicians should you arrive 10 or more minutes late for your appointment.  We strive to give you quality time with our providers, and arriving late affects you and other patients whose appointments are after yours.    Again, thank you for choosing [facility].  Our hope is that these requests will decrease the amount of time that you wait before being seen by our physicians.       _____________________________________________________________  Should you have questions after your visit to [facility], please contact our office at [phone] between the hours of 8:30 a.m. and 5:00 p.m.  Voicemails left after 4:30 p.m. will not be returned until the following business day.  For prescription refill requests, have your pharmacy contact our office with your prescription refill request.

## 2013-12-04 NOTE — Progress Notes (Signed)
[place]  OFFICE PROGRESS NOTE  Monica Cheadle, PA-C [address] [place] [address]  DIAGNOSIS: Colon cancer - Plan: CT Abdomen Pelvis W Contrast, CT Chest W Contrast  Chief Complaint  Patient presents with  . Colon cancer with liver metastases    Avastin/Xeloda maintenance therapy    CURRENT THERAPY: Avastin/Xeloda maintenance therapy since November 2014.  INTERVAL HISTORY: Monica Allen 58 y.o. 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 She had previously been treated with 12 cycles of FOLFOX with Avastin.  She experiences less diarrhea on lower doses of Xeloda. She completed a week of treatment yesterday. Appetite has been good with no sore mouth. She denies any skin rash, epistaxis, melena, hematochezia, hematuria, vaginal bleeding, lower extremity swelling or redness, joint pain, skin rash, fever, night sweats, headache, or seizures. Peripheral paresthesias and discomfort better controlled on gabapentin.  MEDICAL HISTORY: Past Medical History  Diagnosis Date  . Diabetes mellitus   . Hypertension   . Enlarged heart   . Goiter   . Nonischemic cardiomyopathy   . Anemia   . Cancer     right colon- ovarian  . Invasive adenocarcinoma of colon 03/08/2013    11/19 LNs POS, THRU TO ADIPOSE TISSUE    INTERIM HISTORY: has THYROMEGALY; DIABETES MELLITUS, UNCONTROLLED, WITH RENAL COMPLICATIONS; ANXIETY; DEPRESSION; HYPERTENSION; CARDIOMYOPATHY, DILATED; CONGESTIVE HEART FAILURE; SINUSITIS, CHRONIC; ALLERGIC RHINITIS; GERD; ONYCHIA AND PARONYCHIA OF TOE; LOW BACK PAIN; Diabetes mellitus; and Invasive adenocarcinoma of colon on her problem list.   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  At that time he did TAH and BSO with lysis of adhesions, partial omentectomy, and resection of pelvic peritoneal implant. At the completion of the surgical procedure there is no evidence of residual disease. Her CEA is within the normal range. Her cancer however did not mark as ovarian in origin and a subsequent colonoscopy revealed a right ascending colon cancer. This was discovered by Dr. Trinda Allen on 02/14/2013. It was near circumferential.  She underwent resection on 03/01/2013  She then received 12 treatments with FOLFOX plus Avastin with last treatment given on 09/12/2013. She was started on maintenance Avastin plus Xeloda on 10/02/2013 after documenting an excellent response on CT scan done in November 2014.  ALLERGIES:  is allergic to actos and ibuprofen.  MEDICATIONS: has a current medication list which includes the following prescription(s): aspirin ec, capecitabine, carvedilol, furosemide, gabapentin, glipizide, lidocaine-prilocaine, linagliptin-metformin hcl, lorazepam, ondansetron, potassium chloride sa, ramipril, and valsartan, and the following Facility-Administered Medications: bevacizumab (AVASTIN) 1,625 mg in sodium chloride 0.9 % 100 mL chemo infusion, heparin lock flush, and sodium chloride.  SURGICAL HISTORY:  Past Surgical History  Procedure Laterality Date  . Wisdom tooth extraction    . Laparotomy  12/13/2012    Procedure: EXPLORATORY LAPAROTOMY;  Surgeon: Monica Chapel, MD;  Location: [facility];  Service: Gynecology;  Laterality: N/A;  . Abdominal hysterectomy  12/13/2012    Procedure: HYSTERECTOMY ABDOMINAL;  Surgeon: Monica Chapel, MD;  Location: [facility];  Service: Gynecology;  Laterality: N/A;  . Salpingoophorectomy  12/13/2012    Procedure: SALPINGO OOPHORECTOMY;  Surgeon: Monica Chapel, MD;  Location: [facility];  Service: Gynecology;  Laterality: Bilateral;  . Omentectomy  12/13/2012    Procedure:  OMENTECTOMY;  Surgeon: Monica Corpus, MD;   Location: [facility];  Service: Gynecology;  Laterality: N/A;  . Colonoscopy N/A 02/14/2013    Procedure: COLONOSCOPY;  Surgeon: Monica Bali, MD;  Location: [facility];  Service: Endoscopy;  Laterality: N/A;  2:00 PM-moved to 1115 Kim notified pt  . Partial colectomy N/A 03/01/2013    Procedure: PARTIAL COLECTOMY;  Surgeon: Monica Heading, MD;  Location: [facility];  Service: General;  Laterality: N/A;  . Portacath placement Left 03/01/2013    Procedure: INSERTION PORT-A-CATH;  Surgeon: Monica Heading, MD;  Location: [facility];  Service: General;  Laterality: Left;    FAMILY HISTORY: family history includes Diabetes in her maternal grandmother, mother, and sister; Hypertension in her brother, maternal grandmother, mother, and sister; Stroke in her maternal aunt. There is no history of Colon cancer.  SOCIAL HISTORY:  reports that she quit smoking about 26 years ago. Her smoking use included Cigarettes. She has a 37.5 pack-year smoking history. She does not have any smokeless tobacco history on file. She reports that she drinks alcohol. She reports that she does not use illicit drugs.  REVIEW OF SYSTEMS:  Other than that discussed above is noncontributory.  PHYSICAL EXAMINATION: ECOG PERFORMANCE STATUS: 1 - Symptomatic but completely ambulatory  Last menstrual period 10/07/2012.  GENERAL:alert, no distress and comfortable. Alopecia resolving. SKIN: skin color, texture, turgor are normal, no rashes or significant lesions EYES: PERLA; Conjunctiva are pink and non-injected, sclera clear OROPHARYNX:no exudate, no erythema on lips, buccal mucosa, or tongue. NECK: supple, thyroid normal size, non-tender, without nodularity. No masses CHEST: LifePort in place. No breast masses. LYMPH:  no palpable lymphadenopathy in the cervical, axillary or inguinal LUNGS: clear to auscultation and percussion with normal breathing effort HEART: regular rate & rhythm and no murmurs. ABDOMEN:abdomen soft, non-tender and  normal bowel sounds. Small ventral hernia easily reducible. MUSCULOSKELETAL:no cyanosis of digits and no clubbing. Range of motion normal.  NEURO: alert & oriented x 3 with fluent speech, no focal motor/sensory deficits   LABORATORY DATA: Infusion on 12/04/2013  Component Date Value Range Status  . Specific Gravity, Urine 12/04/2013 >1.030* 1.005 - 1.030 Final  . pH 12/04/2013 5.5  5.0 - 8.0 Final  . Glucose, UA 12/04/2013 NEGATIVE  NEGATIVE mg/dL Final  . Hgb urine dipstick 12/04/2013 NEGATIVE  NEGATIVE Final  . Bilirubin Urine 12/04/2013 NEGATIVE  NEGATIVE Final  . Ketones, ur 12/04/2013 NEGATIVE  NEGATIVE mg/dL Final  . Protein, ur 44/39/2659 100* NEGATIVE mg/dL Final  . Urobilinogen, UA 12/04/2013 0.2  0.0 - 1.0 mg/dL Final  . Nitrite 97/87/7654 NEGATIVE  NEGATIVE Final  . Leukocytes, UA 12/04/2013 NEGATIVE  NEGATIVE Final  Infusion on 11/13/2013  Component Date Value Range Status  . WBC 11/13/2013 6.0  4.0 - 10.5 K/uL Final  . RBC 11/13/2013 4.69  3.87 - 5.11 MIL/uL Final  . Hemoglobin 11/13/2013 13.6  12.0 - 15.0 g/dL Final  . HCT 86/88/5207 40.8  36.0 - 46.0 % Final  . MCV 11/13/2013 87.0  78.0 - 100.0 fL Final  . MCH 11/13/2013 29.0  26.0 - 34.0 pg Final  . MCHC 11/13/2013 33.3  30.0 - 36.0 g/dL Final  . RDW 40/97/9641 14.0  11.5 - 15.5 % Final  . Platelets 11/13/2013 178  150 - 400 K/uL Final  . Neutrophils Relative % 11/13/2013 42* 43 - 77 % Final  . Neutro Abs 11/13/2013 2.5  1.7 - 7.7 K/uL Final  . Lymphocytes Relative 11/13/2013 49* 12 -  46 % Final  . Lymphs Abs 11/13/2013 2.9  0.7 - 4.0 K/uL Final  . Monocytes Relative 11/13/2013 6  3 - 12 % Final  . Monocytes Absolute 11/13/2013 0.4  0.1 - 1.0 K/uL Final  . Eosinophils Relative 11/13/2013 3  0 - 5 % Final  . Eosinophils Absolute 11/13/2013 0.2  0.0 - 0.7 K/uL Final  . Basophils Relative 11/13/2013 0  0 - 1 % Final  . Basophils Absolute 11/13/2013 0.0  0.0 - 0.1 K/uL Final  . Sodium 11/13/2013 142  137 - 147 mEq/L  Final  . Potassium 11/13/2013 3.7  3.7 - 5.3 mEq/L Final  . Chloride 11/13/2013 102  96 - 112 mEq/L Final  . CO2 11/13/2013 26  19 - 32 mEq/L Final  . Glucose, Bld 11/13/2013 184* 70 - 99 mg/dL Final  . BUN 11/13/2013 15  6 - 23 mg/dL Final  . Creatinine, Ser 11/13/2013 0.63  0.50 - 1.10 mg/dL Final  . Calcium 11/13/2013 9.5  8.4 - 10.5 mg/dL Final  . Total Protein 11/13/2013 7.4  6.0 - 8.3 g/dL Final  . Albumin 11/13/2013 3.4* 3.5 - 5.2 g/dL Final  . Allen 11/13/2013 18  0 - 37 U/L Final  . ALT 11/13/2013 15  0 - 35 U/L Final  . Alkaline Phosphatase 11/13/2013 69  39 - 117 U/L Final  . Total Bilirubin 11/13/2013 0.3  0.3 - 1.2 mg/dL Final  . GFR calc non Af Amer 11/13/2013 >90  >90 mL/min Final  . GFR calc Af Amer 11/13/2013 >90  >90 mL/min Final   Comment: (NOTE)                          The eGFR has been calculated using the CKD EPI equation.                          This calculation has not been validated in all clinical situations.                          eGFR's persistently <90 mL/min signify possible Chronic Kidney                          Disease.  Marland Kitchen Specific Gravity, Urine 11/13/2013 >1.030* 1.005 - 1.030 Final  . pH 11/13/2013 6.0  5.0 - 8.0 Final  . Glucose, UA 11/13/2013 NEGATIVE  NEGATIVE mg/dL Final  . Hgb urine dipstick 11/13/2013 NEGATIVE  NEGATIVE Final  . Bilirubin Urine 11/13/2013 NEGATIVE  NEGATIVE Final  . Ketones, ur 11/13/2013 NEGATIVE  NEGATIVE mg/dL Final  . Protein, ur 11/13/2013 100* NEGATIVE mg/dL Final  . Urobilinogen, UA 11/13/2013 1.0  0.0 - 1.0 mg/dL Final  . Nitrite 11/13/2013 NEGATIVE  NEGATIVE Final  . Leukocytes, UA 11/13/2013 NEGATIVE  NEGATIVE Final  . Cholesterol 11/13/2013 212* 0 - 200 mg/dL Final  . Triglycerides 11/13/2013 113  <150 mg/dL Final  . HDL 11/13/2013 95  >39 mg/dL Final  . Total CHOL/HDL Ratio 11/13/2013 2.2   Final  . VLDL 11/13/2013 23  0 - 40 mg/dL Final  . LDL Cholesterol 11/13/2013 94  0 - 99 mg/dL Final   Comment:                                   Total  Cholesterol/HDL:CHD Risk                          Coronary Heart Disease Risk Table                                              Men   Women                           1/2 Average Risk   3.4   3.3                           Average Risk       5.0   4.4                           2 X Average Risk   9.6   7.1                           3 X Average Risk  23.4   11.0                                                          Use the calculated Patient Ratio                          above and the CHD Risk Table                          to determine the patient's CHD Risk.                                                          ATP III CLASSIFICATION (LDL):                           <100     mg/dL   Optimal                           100-129  mg/dL   Near or Above                                             Optimal                           130-159  mg/dL   Borderline                           160-189  mg/dL   High                           >  190     mg/dL   Very High    PATHOLOGY: No new pathology. K-ras  wild type.  Urinalysis    Component Value Date/Time   COLORURINE YELLOW 10/26/2012 2121   APPEARANCEUR CLEAR 10/26/2012 2121   LABSPEC >1.030* 12/04/2013 0923   PHURINE 5.5 12/04/2013 0923   GLUCOSEU NEGATIVE 12/04/2013 0923   HGBUR NEGATIVE 12/04/2013 0923   BILIRUBINUR NEGATIVE 12/04/2013 0923   KETONESUR NEGATIVE 12/04/2013 0923   PROTEINUR 100* 12/04/2013 0923   UROBILINOGEN 0.2 12/04/2013 0923   NITRITE NEGATIVE 12/04/2013 0923   LEUKOCYTESUR NEGATIVE 12/04/2013 0923    RADIOGRAPHIC STUDIES: No results found.  ASSESSMENT:  #1.Stage IV colon cancer with abdominal metastases, status post 12 cycles of FOLFOX plus Avastin with no evidence of disease on CT scan, for cycle #2 of maintenance Avastin plus Xeloda today. This is an off week for Xeloda which he takes one week on 1 week off. Avastin is given every 3 weeks.  #2. Diabetes mellitus, type II, non-insulin  requiring, controlled.  #3. Hypertension, controlled.  #4. Nontoxic diffuse goiter.  #5. Worsening peripheral neuropathy, improved on gabapentin 300 mg 3 times a day.   PLAN:  #1. Avastin intravenously today with resumption of Xeloda in one week taking one week on,1 week off utilizing 1000 mg twice a day with food. #2. Patient was informed that her total cholesterol was 212. #3. Repeat CT scan on 12/19/2013 to assess stability of disease. #4. Followup in 3 weeks for continuation of treatment   All questions were answered. The patient knows to call the clinic with any problems, questions or concerns. We can certainly see the patient much sooner if necessary.   I spent 25 minutes counseling the patient face to face. The total time spent in the appointment was 30 minutes.    Doroteo Bradford, MD 12/04/2013 10:28 AM

## 2013-12-05 ENCOUNTER — Other Ambulatory Visit (HOSPITAL_COMMUNITY): Payer: Self-pay | Admitting: Oncology

## 2013-12-12 ENCOUNTER — Other Ambulatory Visit (HOSPITAL_COMMUNITY): Payer: Self-pay | Admitting: Oncology

## 2013-12-19 ENCOUNTER — Ambulatory Visit (HOSPITAL_COMMUNITY)
Admission: RE | Admit: 2013-12-19 | Discharge: 2013-12-19 | Disposition: A | Discharge status: Home / Self Care | Payer: BC Managed Care – PPO | Source: Ambulatory Visit | Attending: Hematology and Oncology | Admitting: Hematology and Oncology

## 2013-12-19 DIAGNOSIS — C189 Malignant neoplasm of colon, unspecified: Secondary | ICD-10-CM

## 2013-12-19 DIAGNOSIS — K439 Ventral hernia without obstruction or gangrene: Secondary | ICD-10-CM | POA: Insufficient documentation

## 2013-12-19 DIAGNOSIS — I7090 Unspecified atherosclerosis: Secondary | ICD-10-CM | POA: Insufficient documentation

## 2013-12-19 MED ORDER — IOHEXOL 300 MG/ML  SOLN
100.0000 mL | Freq: Once | INTRAMUSCULAR | Status: AC | PRN
  Administered 2013-12-19: 100 mL via INTRAVENOUS

## 2013-12-19 MED ORDER — IOHEXOL 300 MG/ML  SOLN: 50.0000 mL | Freq: Once | INTRAMUSCULAR | Status: DC | PRN

## 2013-12-22 ENCOUNTER — Ambulatory Visit (HOSPITAL_COMMUNITY): Payer: BC Managed Care – PPO

## 2013-12-25 ENCOUNTER — Encounter (HOSPITAL_BASED_OUTPATIENT_CLINIC_OR_DEPARTMENT_OTHER): Payer: BC Managed Care – PPO

## 2013-12-25 ENCOUNTER — Encounter (HOSPITAL_COMMUNITY): Payer: BC Managed Care – PPO | Attending: Oncology

## 2013-12-25 VITALS — BP 214/110 | HR 90 | Temp 97.7°F | Resp 20 | Wt 251.6 lb

## 2013-12-25 VITALS — BP 183/91 | HR 81

## 2013-12-25 DIAGNOSIS — C50919 Malignant neoplasm of unspecified site of unspecified female breast: Secondary | ICD-10-CM

## 2013-12-25 DIAGNOSIS — C182 Malignant neoplasm of ascending colon: Secondary | ICD-10-CM

## 2013-12-25 DIAGNOSIS — G629 Polyneuropathy, unspecified: Secondary | ICD-10-CM

## 2013-12-25 DIAGNOSIS — C787 Secondary malignant neoplasm of liver and intrahepatic bile duct: Secondary | ICD-10-CM

## 2013-12-25 DIAGNOSIS — I10 Essential (primary) hypertension: Secondary | ICD-10-CM

## 2013-12-25 DIAGNOSIS — G609 Hereditary and idiopathic neuropathy, unspecified: Secondary | ICD-10-CM

## 2013-12-25 DIAGNOSIS — C189 Malignant neoplasm of colon, unspecified: Secondary | ICD-10-CM

## 2013-12-25 DIAGNOSIS — Z5112 Encounter for antineoplastic immunotherapy: Secondary | ICD-10-CM

## 2013-12-25 DIAGNOSIS — C779 Secondary and unspecified malignant neoplasm of lymph node, unspecified: Secondary | ICD-10-CM

## 2013-12-25 DIAGNOSIS — E119 Type 2 diabetes mellitus without complications: Secondary | ICD-10-CM

## 2013-12-25 LAB — URINALYSIS, DIPSTICK ONLY
Bilirubin Urine: NEGATIVE
GLUCOSE, UA: 250 mg/dL — AB
Hgb urine dipstick: NEGATIVE
Ketones, ur: NEGATIVE mg/dL
LEUKOCYTES UA: NEGATIVE
NITRITE: NEGATIVE
Protein, ur: >300 mg/dL — AB
Urobilinogen, UA: 0.2 mg/dL (ref 0.0–1.0)
pH: 6 (ref 5.0–8.0)

## 2013-12-25 LAB — CBC WITH DIFFERENTIAL/PLATELET
BASOS PCT: 0 % (ref 0–1)
Basophils Absolute: 0 10*3/uL (ref 0.0–0.1)
Eosinophils Absolute: 0.2 10*3/uL (ref 0.0–0.7)
Eosinophils Relative: 3 % (ref 0–5)
HEMATOCRIT: 41.6 % (ref 36.0–46.0)
HEMOGLOBIN: 14.1 g/dL (ref 12.0–15.0)
LYMPHS PCT: 42 % (ref 12–46)
Lymphs Abs: 3 10*3/uL (ref 0.7–4.0)
MCH: 29.1 pg (ref 26.0–34.0)
MCHC: 33.9 g/dL (ref 30.0–36.0)
MCV: 86 fL (ref 78.0–100.0)
MONO ABS: 0.4 10*3/uL (ref 0.1–1.0)
MONOS PCT: 6 % (ref 3–12)
NEUTROS ABS: 3.7 10*3/uL (ref 1.7–7.7)
NEUTROS PCT: 50 % (ref 43–77)
Platelets: 151 10*3/uL (ref 150–400)
RBC: 4.84 MIL/uL (ref 3.87–5.11)
RDW: 13.9 % (ref 11.5–15.5)
WBC: 7.3 10*3/uL (ref 4.0–10.5)

## 2013-12-25 LAB — COMPREHENSIVE METABOLIC PANEL
ALBUMIN: 3.2 g/dL — AB (ref 3.5–5.2)
ALK PHOS: 62 U/L (ref 39–117)
ALT: 11 U/L (ref 0–35)
AST: 13 U/L (ref 0–37)
BILIRUBIN TOTAL: 0.3 mg/dL (ref 0.3–1.2)
BUN: 13 mg/dL (ref 6–23)
CHLORIDE: 102 meq/L (ref 96–112)
CO2: 27 meq/L (ref 19–32)
Calcium: 9.3 mg/dL (ref 8.4–10.5)
Creatinine, Ser: 0.75 mg/dL (ref 0.50–1.10)
GFR calc Af Amer: >90 mL/min (ref >90)
GLUCOSE: 237 mg/dL — AB (ref 70–99)
POTASSIUM: 3.8 meq/L (ref 3.7–5.3)
Sodium: 142 mEq/L (ref 137–147)
Total Protein: 7.2 g/dL (ref 6.0–8.3)

## 2013-12-25 MED ORDER — SODIUM CHLORIDE 0.9 % IV SOLN
Freq: Once | INTRAVENOUS | Status: AC
  Administered 2013-12-25: 13:00:00 via INTRAVENOUS

## 2013-12-25 MED ORDER — GABAPENTIN 300 MG PO CAPS: ORAL_CAPSULE | ORAL | Status: DC

## 2013-12-25 MED ORDER — CLONIDINE HCL 0.1 MG PO TABS
0.1000 mg | ORAL_TABLET | ORAL | Status: DC | PRN
  Administered 2013-12-25 (×4): 0.1 mg via ORAL
  Filled 2013-12-25 (×2): qty 1

## 2013-12-25 MED ORDER — HEPARIN SOD (PORK) LOCK FLUSH 100 UNIT/ML IV SOLN
500.0000 [IU] | Freq: Once | INTRAVENOUS | Status: AC | PRN
  Administered 2013-12-25: 500 [IU]
  Filled 2013-12-25: qty 5

## 2013-12-25 MED ORDER — SODIUM CHLORIDE 0.9 % IJ SOLN: 10.0000 mL | INTRAMUSCULAR | Status: DC | PRN

## 2013-12-25 MED ORDER — CLONIDINE HCL 0.1 MG PO TABS: ORAL_TABLET | ORAL | Status: DC

## 2013-12-25 MED ORDER — SODIUM CHLORIDE 0.9 % IV SOLN
15.0000 mg/kg | Freq: Once | INTRAVENOUS | Status: AC
  Administered 2013-12-25: 1625 mg via INTRAVENOUS
  Filled 2013-12-25: qty 4

## 2013-12-25 NOTE — Progress Notes (Signed)
[facility]  OFFICE PROGRESS NOTE  [name], PA-C [address] [place] [address]  DIAGNOSIS: Invasive adenocarcinoma of colon  Peripheral neuropathy  Diabetes mellitus  Chief Complaint  Patient presents with  . Colon carcinoma, metastatic  . Maintenance with Xeloda plus Avastin    CURRENT THERAPY: Avastin/Xeloda maintenance therapy since November 2014  INTERVAL HISTORY: Monica Allen 58 y.o. 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  She is not taking her antihypertensive medication for the last 3 days for an unknown reason. She's had some cough with congestion. She denies a sore throat, earache, fever, or night sweats. Appetite is good with no nausea, vomiting, diarrhea, constipation, melena, hematochezia, hematuria, lower extremity swelling or redness, skin rash, headache, or seizures. Peripheral neuropathic discomfort in both feet is worse.  MEDICAL HISTORY: Past Medical History  Diagnosis Date  . Diabetes mellitus   . Hypertension   . Enlarged heart   . Goiter   . Nonischemic cardiomyopathy   . Anemia   . Cancer     right colon- ovarian  . Invasive adenocarcinoma of colon 03/08/2013    11/19 LNs POS, THRU TO ADIPOSE TISSUE    INTERIM HISTORY: has THYROMEGALY; DIABETES MELLITUS, UNCONTROLLED, WITH RENAL COMPLICATIONS; ANXIETY; DEPRESSION; HYPERTENSION; CARDIOMYOPATHY, DILATED; CONGESTIVE HEART FAILURE; SINUSITIS, CHRONIC; ALLERGIC RHINITIS; GERD; ONYCHIA AND PARONYCHIA OF TOE; LOW BACK PAIN; Diabetes mellitus; and Invasive adenocarcinoma of colon on her problem list.   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 Aldean Ast on 12/13/2012. At that time he did TAH and BSO with lysis of adhesions,  partial omentectomy, and resection of pelvic peritoneal implant. At the completion of the surgical procedure there is no evidence of residual disease. Her CEA is within the normal range. Her cancer however did not mark as ovarian in origin and a subsequent colonoscopy revealed a right ascending colon cancer. This was discovered by Dr. Trinda Pascal on 02/14/2013. It was near circumferential.  She underwent resection on 03/01/2013 She then received 12 treatments with FOLFOX plus Avastin with last treatment given on 09/12/2013. She was started on maintenance Avastin plus Xeloda on 10/02/2013 after documenting an excellent response on CT scan done in November 2014. Repeat CT scan in 12/19/2013 showed minimal enlargement peritoneal lymph nodes only, with plans to continue maintenance therapy.    ALLERGIES:  is allergic to actos and ibuprofen.  MEDICATIONS: has a current medication list which includes the following prescription(s): aspirin ec, capecitabine, furosemide, glipizide, hydrocodone-acetaminophen, lidocaine-prilocaine, linagliptin-metformin hcl, carvedilol, clonidine, gabapentin, potassium chloride sa, ramipril, and valsartan, and the following Facility-Administered Medications: clonidine.  SURGICAL HISTORY:  Past Surgical History  Procedure Laterality Date  . Wisdom tooth extraction    . Laparotomy  12/13/2012    Procedure: EXPLORATORY LAPAROTOMY;  Surgeon: Alvino Chapel, MD;  Location: [facility];  Service: Gynecology;  Laterality: N/A;  . Abdominal hysterectomy  12/13/2012    Procedure: HYSTERECTOMY ABDOMINAL;  Surgeon: Alvino Chapel, MD;  Location: [facility];  Service: Gynecology;  Laterality: N/A;  . Salpingoophorectomy  12/13/2012    Procedure: SALPINGO OOPHORECTOMY;  Surgeon: Alvino Chapel, MD;  Location: [facility];  Service: Gynecology;  Laterality: Bilateral;  . Omentectomy  12/13/2012    Procedure: OMENTECTOMY;  Surgeon: Alvino Chapel, MD;  Location: [facility];  Service: Gynecology;  Laterality: N/A;  . Colonoscopy N/A 02/14/2013    Procedure: COLONOSCOPY;  Surgeon: Danie Binder, MD;  Location: [facility];  Service: Endoscopy;  Laterality: N/A;  2:00 PM-moved to [place] notified pt  . Partial colectomy N/A 03/01/2013    Procedure: PARTIAL COLECTOMY;  Surgeon: Jamesetta So, MD;  Location: [facility];  Service: General;  Laterality: N/A;  . Portacath placement Left 03/01/2013    Procedure: INSERTION PORT-A-CATH;  Surgeon: Jamesetta So, MD;  Location: [facility];  Service: General;  Laterality: Left;    FAMILY HISTORY: family history includes Diabetes in her maternal grandmother, mother, and sister; Hypertension in her brother, maternal grandmother, mother, and sister; Stroke in her maternal aunt. There is no history of Colon cancer.  SOCIAL HISTORY:  reports that she quit smoking about 26 years ago. Her smoking use included Cigarettes. She has a 37.5 pack-year smoking history. She does not have any smokeless tobacco history on file. She reports that she drinks alcohol. She reports that she does not use illicit drugs.  REVIEW OF SYSTEMS:  Other than that discussed above is noncontributory.  PHYSICAL EXAMINATION: ECOG PERFORMANCE STATUS: 1 - Symptomatic but completely ambulatory  Blood pressure 214/110, pulse 90, temperature 97.7 F (36.5 C), temperature source Oral, resp. rate 20, weight 251 lb 9.6 oz (114.125 kg), last menstrual period 10/07/2012.  GENERAL:alert, no distress and comfortable SKIN: skin color, texture, turgor are normal, no rashes or significant lesions. No evidence of hand-foot syndrome. EYES: PERLA; Conjunctiva are pink and non-injected, sclera clear OROPHARYNX:no exudate, no erythema on lips, buccal mucosa, or tongue. NECK: supple, thyroid normal size, non-tender, without nodularity. No masses CHEST: Normal AP diameter with light port in place. LYMPH:  no palpable lymphadenopathy in the cervical, axillary or inguinal LUNGS: clear to  auscultation and percussion with normal breathing effort HEART: regular rate & rhythm and no murmurs. ABDOMEN:abdomen soft, non-tender and normal bowel sounds. No distention liver and spleen not palpable. MUSCULOSKELETAL:no cyanosis of digits and no clubbing. Range of motion normal.  NEURO: alert & oriented x 3 with fluent speech, no focal motor/sensory deficits   LABORATORY DATA: Infusion on 12/25/2013  Component Date Value Ref Range Status  . WBC 12/25/2013 7.3  4.0 - 10.5 K/uL Final  . RBC 12/25/2013 4.84  3.87 - 5.11 MIL/uL Final  . Hemoglobin 12/25/2013 14.1  12.0 - 15.0 g/dL Final  . HCT 12/25/2013 41.6  36.0 - 46.0 % Final  . MCV 12/25/2013 86.0  78.0 - 100.0 fL Final  . MCH 12/25/2013 29.1  26.0 - 34.0 pg Final  . MCHC 12/25/2013 33.9  30.0 - 36.0 g/dL Final  . RDW 12/25/2013 13.9  11.5 - 15.5 % Final  . Platelets 12/25/2013 151  150 - 400 K/uL Final  . Neutrophils Relative % 12/25/2013 50  43 - 77 % Final  . Neutro Abs 12/25/2013 3.7  1.7 - 7.7 K/uL Final  . Lymphocytes Relative 12/25/2013 42  12 - 46 % Final  . Lymphs Abs 12/25/2013 3.0  0.7 - 4.0 K/uL Final  . Monocytes Relative 12/25/2013 6  3 - 12 % Final  . Monocytes Absolute 12/25/2013 0.4  0.1 - 1.0 K/uL Final  . Eosinophils Relative 12/25/2013 3  0 - 5 % Final  . Eosinophils Absolute 12/25/2013 0.2  0.0 - 0.7 K/uL Final  . Basophils Relative 12/25/2013 0  0 - 1 % Final  . Basophils Absolute 12/25/2013 0.0  0.0 - 0.1 K/uL Final  Infusion on 12/04/2013  Component Date Value Ref Range Status  . WBC 12/04/2013 6.6  4.0 - 10.5 K/uL Final  . RBC 12/04/2013 4.68  3.87 - 5.11 MIL/uL Final  . Hemoglobin 12/04/2013 13.6  12.0 - 15.0 g/dL Final  . HCT 12/04/2013 40.3  36.0 - 46.0 % Final  . MCV 12/04/2013 86.1  78.0 - 100.0 fL Final  . MCH 12/04/2013 29.1  26.0 - 34.0 pg Final  . MCHC 12/04/2013 33.7  30.0 - 36.0 g/dL Final  . RDW 12/04/2013 13.5  11.5 - 15.5 % Final  . Platelets 12/04/2013 180  150 - 400 K/uL Final  .  Neutrophils Relative % 12/04/2013 40* 43 - 77 % Final  . Neutro Abs 12/04/2013 2.6  1.7 - 7.7 K/uL Final  . Lymphocytes Relative 12/04/2013 50* 12 - 46 % Final  . Lymphs Abs 12/04/2013 3.3  0.7 - 4.0 K/uL Final  . Monocytes Relative 12/04/2013 7  3 - 12 % Final  . Monocytes Absolute 12/04/2013 0.5  0.1 - 1.0 K/uL Final  . Eosinophils Relative 12/04/2013 3  0 - 5 % Final  . Eosinophils Absolute 12/04/2013 0.2  0.0 - 0.7 K/uL Final  . Basophils Relative 12/04/2013 0  0 - 1 % Final  . Basophils Absolute 12/04/2013 0.0  0.0 - 0.1 K/uL Final  . Sodium 12/04/2013 140  137 - 147 mEq/L Final  . Potassium 12/04/2013 4.3  3.7 - 5.3 mEq/L Final  . Chloride 12/04/2013 105  96 - 112 mEq/L Final  . CO2 12/04/2013 22  19 - 32 mEq/L Final  . Glucose, Bld 12/04/2013 208* 70 - 99 mg/dL Final  . BUN 12/04/2013 18  6 - 23 mg/dL Final  . Creatinine, Ser 12/04/2013 0.64  0.50 - 1.10 mg/dL Final  . Calcium 12/04/2013 9.1  8.4 - 10.5 mg/dL Final  . Total Protein 12/04/2013 7.2  6.0 - 8.3 g/dL Final  . Albumin 12/04/2013 3.2* 3.5 - 5.2 g/dL Final  . AST 12/04/2013 16  0 - 37 U/L Final  . ALT 12/04/2013 12  0 - 35 U/L Final  . Alkaline Phosphatase 12/04/2013 66  39 - 117 U/L Final  . Total Bilirubin 12/04/2013 0.2* 0.3 - 1.2 mg/dL Final  . GFR calc non Af Amer 12/04/2013 >90  >90 mL/min Final  . GFR calc Af Amer 12/04/2013 >90  >90 mL/min Final   Comment: (NOTE)                          The eGFR has been calculated using the CKD EPI equation.                          This calculation has not been validated in all clinical situations.                          eGFR's persistently <90 mL/min signify possible Chronic Kidney                          Disease.  . CEA 12/04/2013 2.8  0.0 - 5.0 ng/mL Final   Performed at [facility]  . Specific Gravity, Urine 12/04/2013 >1.030* 1.005 - 1.030 Final  . pH 12/04/2013 5.5  5.0 - 8.0 Final  . Glucose, UA 12/04/2013 NEGATIVE  NEGATIVE mg/dL Final  . Hgb urine  dipstick 12/04/2013 NEGATIVE  NEGATIVE Final  . Bilirubin Urine 12/04/2013 NEGATIVE  NEGATIVE Final  . Ketones, ur 12/04/2013 NEGATIVE  NEGATIVE mg/dL Final  . Protein, ur 96/26/7051 100* NEGATIVE mg/dL Final  . Urobilinogen, UA 12/04/2013 0.2  0.0 - 1.0 mg/dL Final  . Nitrite 86/36/3566 NEGATIVE  NEGATIVE Final  . Leukocytes, UA 12/04/2013 NEGATIVE  NEGATIVE Final    PATHOLOGY: No new pathology. K-ras wild-type  Urinalysis    Component Value Date/Time   COLORURINE YELLOW 10/26/2012 2121   APPEARANCEUR CLEAR 10/26/2012 2121   LABSPEC >1.030* 12/04/2013 0923   PHURINE 5.5 12/04/2013 0923   GLUCOSEU NEGATIVE 12/04/2013 0923   HGBUR NEGATIVE 12/04/2013 0923   BILIRUBINUR NEGATIVE 12/04/2013 0923   KETONESUR NEGATIVE 12/04/2013 0923   PROTEINUR 100* 12/04/2013 0923   UROBILINOGEN 0.2 12/04/2013 0923   NITRITE NEGATIVE 12/04/2013 0923   LEUKOCYTESUR NEGATIVE 12/04/2013 0923    RADIOGRAPHIC STUDIES: Ct Chest W Contrast  12/19/2013   CLINICAL DATA:  History of colon cancer.  Restaging examination.  EXAM: CT CHEST, ABDOMEN, AND PELVIS WITH CONTRAST  TECHNIQUE: Multidetector CT imaging of the chest, abdomen and pelvis was performed following the standard protocol during bolus administration of intravenous contrast.  CONTRAST:  100 mL OMNIPAQUE IOHEXOL 300 MG/ML  SOLN  COMPARISON:  CT chest, abdomen and pelvis 09/26/2013 and 06/20/2013.  FINDINGS: CT CHEST FINDINGS  Enlarged heterogeneous thyroid gland is again seen. The appearance is unchanged. There is no axillary, hilar or mediastinal lymphadenopathy. Heart size is mildly enlarged. No pleural or pericardial effusion. Focus of a few tiny micro nodules in the right lower lobe is again seen on image 28. The appearance is unchanged and likely due to prior infectious or inflammatory process. The lungs are otherwise unremarkable. No focal bony abnormality is identified.  CT ABDOMEN AND PELVIS FINDINGS  The liver, spleen, adrenal glands, biliary tree and  kidneys are unremarkable. The gallbladder is decompressed and appears to have small stones within it. There is no pericholecystic inflammatory change.  Midline ventral hernia containing loops of bowel is unchanged. There is no obstruction or other complicating feature. The patient appears to be status post right hemicolectomy with a surgical anastomosis identified. Loop of colon at the anastomosis appears adhesed to the anterior abdominal wall but there is no obstruction. Previously seen left periaortic lymph node measuring 1.0 cm is unchanged on image 70. No lymphadenopathy or fluid collection is identified. Aortoiliac atherosclerosis is noted. No focal bony abnormality is identified.  IMPRESSION: Negative for recurrent or metastatic disease in the chest, abdomen or pelvis. No acute abnormality.  No change in small retroperitoneal lymph nodes. No new or enlarging lymph nodes are identified.  No change in a midline ventral hernia containing loops of bowel.  Atherosclerosis.   Electronically Signed   By: Drusilla Kanner M.D.   On: 12/19/2013 16:02   Ct Abdomen Pelvis W Contrast  12/19/2013   CLINICAL DATA:  History of colon cancer.  Restaging examination.  EXAM: CT CHEST, ABDOMEN, AND PELVIS WITH CONTRAST  TECHNIQUE: Multidetector CT imaging of the chest, abdomen and pelvis was performed following the standard protocol during bolus administration of intravenous contrast.  CONTRAST:  100 mL OMNIPAQUE IOHEXOL 300 MG/ML  SOLN  COMPARISON:  CT chest, abdomen and pelvis 09/26/2013 and 06/20/2013.  FINDINGS: CT CHEST FINDINGS  Enlarged heterogeneous thyroid gland is again seen. The appearance is unchanged. There is no axillary, hilar or mediastinal lymphadenopathy. Heart size is mildly enlarged. No pleural or pericardial effusion. Focus of a few tiny micro nodules  in the right lower lobe is again seen on image 28. The appearance is unchanged and likely due to prior infectious or inflammatory process. The lungs are  otherwise unremarkable. No focal bony abnormality is identified.  CT ABDOMEN AND PELVIS FINDINGS  The liver, spleen, adrenal glands, biliary tree and kidneys are unremarkable. The gallbladder is decompressed and appears to have small stones within it. There is no pericholecystic inflammatory change.  Midline ventral hernia containing loops of bowel is unchanged. There is no obstruction or other complicating feature. The patient appears to be status post right hemicolectomy with a surgical anastomosis identified. Loop of colon at the anastomosis appears adhesed to the anterior abdominal wall but there is no obstruction. Previously seen left periaortic lymph node measuring 1.0 cm is unchanged on image 70. No lymphadenopathy or fluid collection is identified. Aortoiliac atherosclerosis is noted. No focal bony abnormality is identified.  IMPRESSION: Negative for recurrent or metastatic disease in the chest, abdomen or pelvis. No acute abnormality.  No change in small retroperitoneal lymph nodes. No new or enlarging lymph nodes are identified.  No change in a midline ventral hernia containing loops of bowel.  Atherosclerosis.   Electronically Signed   By: Inge Rise M.D.   On: 12/19/2013 16:02    ASSESSMENT:  #1. Stage IV colon cancer with liver and intra-abdominal metastases, no evidence of disease except for small retroperitoneal lymph nodes on most recent CT scan, status post 12 cycles of FOLFOX plus Avastin with resultant peripheral neuropathy. #2. Worsening lower extremity neuropathy. #3. Uncontrolled hypertension due to noncompliance.   PLAN:  #1. Clonidine 0.1 mg orally every 15 minutes until diastolic blood pressure less than 90. #2. Xeloda 1000 mg twice a day with food for 7 days on and 7 days off. #3. Followup in 3 weeks with CBC, chem profile, and CEA. #4. Patient was encouraged to continue antihypertensive medication and not to miss any doses.   All questions were answered. The patient  knows to call the clinic with any problems, questions or concerns. We can certainly see the patient much sooner if necessary.   I spent 25 minutes counseling the patient face to face. The total time spent in the appointment was 30 minutes.    Doroteo Bradford, MD 12/25/2013 12:27 PM

## 2013-12-25 NOTE — Progress Notes (Signed)
Patient states that her legs get tight in addition to her feet hurting

## 2013-12-25 NOTE — Progress Notes (Signed)
Discussed BP and urine results  with Dr. Barnet Glasgow and orders from him to proceed with treatment. Patient to take BP med once she arrives home. Tolerated infusion well. VSS on discharge.

## 2013-12-25 NOTE — Patient Instructions (Signed)
[facility] Discharge Instructions  RECOMMENDATIONS MADE BY THE CONSULTANT AND ANY TEST RESULTS WILL BE SENT TO YOUR REFERRING PHYSICIAN.  EXAM FINDINGS BY THE PHYSICIAN TODAY AND SIGNS OR SYMPTOMS TO REPORT TO CLINIC OR PRIMARY PHYSICIAN:  Return in 3 weeks for Avastin and to see Dr. Barnet Glasgow   Wednesday January 17, 2014 @ 11    Thank you for choosing [facility] to provide your oncology and hematology care.  To afford each patient quality time with our providers, please arrive at least 15 minutes before your scheduled appointment time.  With your help, our goal is to use those 15 minutes to complete the necessary work-up to ensure our physicians have the information they need to help with your evaluation and healthcare recommendations.    Effective January 1st, 2014, we ask that you re-schedule your appointment with our physicians should you arrive 10 or more minutes late for your appointment.  We strive to give you quality time with our providers, and arriving late affects you and other patients whose appointments are after yours.    Again, thank you for choosing [facility].  Our hope is that these requests will decrease the amount of time that you wait before being seen by our physicians.       _____________________________________________________________  Should you have questions after your visit to [facility], please contact our office at [phone] between the hours of 8:30 a.m. and 5:00 p.m.  Voicemails left after 4:30 p.m. will not be returned until the following business day.  For prescription refill requests, have your pharmacy contact our office with your prescription refill request.

## 2013-12-29 MED ORDER — ACETAMINOPHEN 325 MG PO TABS
ORAL_TABLET | ORAL | Status: AC
  Filled 2013-12-29: qty 2

## 2013-12-29 MED ORDER — DIPHENHYDRAMINE HCL 25 MG PO CAPS
ORAL_CAPSULE | ORAL | Status: AC
  Filled 2013-12-29: qty 2

## 2013-12-29 MED ORDER — HEPARIN SOD (PORK) LOCK FLUSH 100 UNIT/ML IV SOLN
INTRAVENOUS | Status: AC
  Filled 2013-12-29: qty 5

## 2014-01-03 ENCOUNTER — Other Ambulatory Visit (HOSPITAL_COMMUNITY): Payer: Self-pay | Admitting: Oncology

## 2014-01-03 DIAGNOSIS — G629 Polyneuropathy, unspecified: Secondary | ICD-10-CM

## 2014-01-03 MED ORDER — GABAPENTIN 300 MG PO CAPS: ORAL_CAPSULE | ORAL | Status: DC

## 2014-01-15 ENCOUNTER — Inpatient Hospital Stay (HOSPITAL_COMMUNITY): Payer: BC Managed Care – PPO

## 2014-01-17 ENCOUNTER — Encounter (HOSPITAL_BASED_OUTPATIENT_CLINIC_OR_DEPARTMENT_OTHER): Payer: BC Managed Care – PPO

## 2014-01-17 ENCOUNTER — Encounter (HOSPITAL_COMMUNITY): Payer: BC Managed Care – PPO | Attending: Oncology

## 2014-01-17 ENCOUNTER — Encounter (HOSPITAL_COMMUNITY): Payer: Self-pay | Admitting: Hematology and Oncology

## 2014-01-17 ENCOUNTER — Encounter (HOSPITAL_COMMUNITY): Payer: Self-pay

## 2014-01-17 VITALS — BP 173/91 | HR 71 | Temp 98.2°F | Resp 18 | Wt 258.0 lb

## 2014-01-17 DIAGNOSIS — Z5112 Encounter for antineoplastic immunotherapy: Secondary | ICD-10-CM

## 2014-01-17 DIAGNOSIS — C787 Secondary malignant neoplasm of liver and intrahepatic bile duct: Secondary | ICD-10-CM

## 2014-01-17 DIAGNOSIS — C189 Malignant neoplasm of colon, unspecified: Secondary | ICD-10-CM

## 2014-01-17 DIAGNOSIS — I10 Essential (primary) hypertension: Secondary | ICD-10-CM

## 2014-01-17 DIAGNOSIS — E119 Type 2 diabetes mellitus without complications: Secondary | ICD-10-CM

## 2014-01-17 DIAGNOSIS — J309 Allergic rhinitis, unspecified: Secondary | ICD-10-CM

## 2014-01-17 LAB — CBC WITH DIFFERENTIAL/PLATELET
BASOS PCT: 0 % (ref 0–1)
Basophils Absolute: 0 10*3/uL (ref 0.0–0.1)
EOS ABS: 0.2 10*3/uL (ref 0.0–0.7)
Eosinophils Relative: 3 % (ref 0–5)
HCT: 39.2 % (ref 36.0–46.0)
Hemoglobin: 12.9 g/dL (ref 12.0–15.0)
Lymphocytes Relative: 54 % — ABNORMAL HIGH (ref 12–46)
Lymphs Abs: 3.1 10*3/uL (ref 0.7–4.0)
MCH: 28.2 pg (ref 26.0–34.0)
MCHC: 32.9 g/dL (ref 30.0–36.0)
MCV: 85.6 fL (ref 78.0–100.0)
Monocytes Absolute: 0.3 10*3/uL (ref 0.1–1.0)
Monocytes Relative: 6 % (ref 3–12)
Neutro Abs: 2.1 10*3/uL (ref 1.7–7.7)
Neutrophils Relative %: 37 % — ABNORMAL LOW (ref 43–77)
PLATELETS: 121 10*3/uL — AB (ref 150–400)
RBC: 4.58 MIL/uL (ref 3.87–5.11)
RDW: 14.1 % (ref 11.5–15.5)
WBC: 5.7 10*3/uL (ref 4.0–10.5)

## 2014-01-17 LAB — COMPREHENSIVE METABOLIC PANEL
ALBUMIN: 3.1 g/dL — AB (ref 3.5–5.2)
ALK PHOS: 58 U/L (ref 39–117)
ALT: 13 U/L (ref 0–35)
AST: 17 U/L (ref 0–37)
BUN: 18 mg/dL (ref 6–23)
CO2: 25 mEq/L (ref 19–32)
Calcium: 9.1 mg/dL (ref 8.4–10.5)
Chloride: 102 mEq/L (ref 96–112)
Creatinine, Ser: 0.62 mg/dL (ref 0.50–1.10)
GFR calc Af Amer: >90 mL/min (ref >90)
GFR calc non Af Amer: >90 mL/min (ref >90)
Glucose, Bld: 336 mg/dL — ABNORMAL HIGH (ref 70–99)
Potassium: 4.3 mEq/L (ref 3.7–5.3)
Sodium: 140 mEq/L (ref 137–147)
Total Bilirubin: 0.3 mg/dL (ref 0.3–1.2)
Total Protein: 6.8 g/dL (ref 6.0–8.3)

## 2014-01-17 LAB — URINALYSIS, DIPSTICK ONLY
BILIRUBIN URINE: NEGATIVE
Glucose, UA: 500 mg/dL — AB
Hgb urine dipstick: NEGATIVE
Ketones, ur: NEGATIVE mg/dL
LEUKOCYTES UA: NEGATIVE
NITRITE: NEGATIVE
Protein, ur: 100 mg/dL — AB
UROBILINOGEN UA: 0.2 mg/dL (ref 0.0–1.0)
pH: 6 (ref 5.0–8.0)

## 2014-01-17 MED ORDER — SODIUM CHLORIDE 0.9 % IV SOLN
7.5000 mg/kg | Freq: Once | INTRAVENOUS | Status: AC
  Administered 2014-01-17: 800 mg via INTRAVENOUS
  Filled 2014-01-17: qty 32

## 2014-01-17 MED ORDER — SODIUM CHLORIDE 0.9 % IJ SOLN: 10.0000 mL | INTRAMUSCULAR | Status: DC | PRN

## 2014-01-17 MED ORDER — SODIUM CHLORIDE 0.9 % IV SOLN
Freq: Once | INTRAVENOUS | Status: AC
  Administered 2014-01-17: 12:00:00 via INTRAVENOUS

## 2014-01-17 MED ORDER — HEPARIN SOD (PORK) LOCK FLUSH 100 UNIT/ML IV SOLN
INTRAVENOUS | Status: AC
  Filled 2014-01-17: qty 5

## 2014-01-17 MED ORDER — HEPARIN SOD (PORK) LOCK FLUSH 100 UNIT/ML IV SOLN
500.0000 [IU] | Freq: Once | INTRAVENOUS | Status: AC | PRN
  Administered 2014-01-17: 500 [IU]

## 2014-01-17 NOTE — Patient Instructions (Signed)
[facility] Discharge Instructions  RECOMMENDATIONS MADE BY THE CONSULTANT AND ANY TEST RESULTS WILL BE SENT TO YOUR REFERRING PHYSICIAN.  EXAM FINDINGS BY THE PHYSICIAN TODAY AND SIGNS OR SYMPTOMS TO REPORT TO CLINIC OR PRIMARY PHYSICIAN: Exam and findings as discussed by Dr. Barnet Glasgow.  Will decrease your avastin dosage due to the increased protein levels in your urine.  It is very important that you take your BP medications as prescribed.  Continue xeloda 7 days on and 7 days off.  MEDICATIONS PRESCRIBED:  Loratadine (claritin) 10 mg daily for allergy symptoms.  INSTRUCTIONS/FOLLOW-UP: Follow-up in 3 weeks with office visit and chemotherapy  Thank you for choosing [facility] to provide your oncology and hematology care.  To afford each patient quality time with our providers, please arrive at least 15 minutes before your scheduled appointment time.  With your help, our goal is to use those 15 minutes to complete the necessary work-up to ensure our physicians have the information they need to help with your evaluation and healthcare recommendations.    Effective January 1st, 2014, we ask that you re-schedule your appointment with our physicians should you arrive 10 or more minutes late for your appointment.  We strive to give you quality time with our providers, and arriving late affects you and other patients whose appointments are after yours.    Again, thank you for choosing [facility].  Our hope is that these requests will decrease the amount of time that you wait before being seen by our physicians.       _____________________________________________________________  Should you have questions after your visit to [facility], please contact our office at [phone] between the hours of 8:30 a.m. and 5:00 p.m.  Voicemails left after 4:30 p.m. will not be returned until the following business day.  For prescription refill  requests, have your pharmacy contact our office with your prescription refill request.

## 2014-01-17 NOTE — Progress Notes (Signed)
[facility]  OFFICE PROGRESS NOTE  [name], PA-C [address] [place] [address]  DIAGNOSIS: Carcinoma of colon metastatic to multiple sites  Unspecified essential hypertension  Diabetes mellitus  Allergic rhinitis  Chief Complaint  Patient presents with  . Stage IV colon cancer    Maintenance Avastin/Xeloda  . Proteinuria    CURRENT THERAPY: Maintenance Xeloda/Avastin since November 2014.  INTERVAL HISTORY: Monica Allen 58 y.o. 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  Due to inclement weather, the patient was unable to get her Xeloda prescription for Lipitor home on time so she'll need to 4 days of treatment last week. She has noticed increasing nasal drip without fever but with runny nose and no sore throat or earache. She experiences minimal cough without shortness of breath, PND, orthopnea, or palpitations. She also denies any abdominal distention or severe pain. Urination is normal with no incontinence, melena, hematochezia, skin rash, headache, or seizures. She has not been taking for diabetes medicine because she has not been able to afford it until later this week.    MEDICAL HISTORY: Past Medical History  Diagnosis Date  . Diabetes mellitus   . Hypertension   . Enlarged heart   . Goiter   . Nonischemic cardiomyopathy   . Anemia   . Cancer     right colon- ovarian  . Invasive adenocarcinoma of colon 03/08/2013    11/19 LNs POS, THRU TO ADIPOSE TISSUE    INTERIM HISTORY: has THYROMEGALY; DIABETES MELLITUS, UNCONTROLLED, WITH RENAL COMPLICATIONS; ANXIETY; DEPRESSION; HYPERTENSION; CARDIOMYOPATHY, DILATED; CONGESTIVE HEART FAILURE; SINUSITIS, CHRONIC; ALLERGIC RHINITIS; GERD; ONYCHIA AND PARONYCHIA OF TOE; LOW BACK PAIN; Diabetes mellitus; and Invasive adenocarcinoma of colon on her problem list.    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 Aldean Ast on 12/13/2012. At that time he did TAH and BSO with lysis of adhesions, partial omentectomy, and resection of pelvic peritoneal implant. At the completion of the surgical procedure there is no evidence of residual disease. Her CEA is within the normal range. Her cancer however did not mark as ovarian in origin and a subsequent colonoscopy revealed a right ascending colon cancer. This was discovered by Dr. Trinda Pascal on 02/14/2013. It was near circumferential.  She underwent resection on 03/01/2013 She then received 12 treatments with FOLFOX plus Avastin with last treatment given on 09/12/2013. She was started on maintenance Avastin plus Xeloda on 10/02/2013 after documenting an excellent response on CT scan done in November 2014. Repeat CT scan in 12/19/2013 showed minimal enlargement peritoneal lymph nodes only, with plans to continue maintenance therapy. Because of noncompliance with antihypertensives and with worsening proteinuria, dose of Avastin will be decreased on 01/17/2014 to 7.5 mg per kilogram per recommendations made in the Journal of the [place] of Nephrology volume 21 page [phone] (August 2010).  ALLERGIES:  is allergic to actos and ibuprofen.  MEDICATIONS: has a current medication list which includes the following prescription(s): aspirin ec, capecitabine, carvedilol, furosemide, gabapentin, hydrocodone-acetaminophen, lidocaine-prilocaine, ramipril, valsartan, clonidine, glipizide, linagliptin-metformin hcl, and potassium chloride sa, and the following Facility-Administered Medications: sodium chloride.  SURGICAL HISTORY:  Past Surgical History  Procedure Laterality Date  . Wisdom tooth extraction    . Laparotomy  12/13/2012    Procedure: EXPLORATORY LAPAROTOMY;  Surgeon: Alvino Chapel, MD;  Location: [facility];  Service: Gynecology;   Laterality: N/A;  . Abdominal hysterectomy  12/13/2012    Procedure: HYSTERECTOMY ABDOMINAL;  Surgeon: Alvino Chapel, MD;  Location: [facility];  Service: Gynecology;  Laterality: N/A;  . Salpingoophorectomy  12/13/2012    Procedure: SALPINGO OOPHORECTOMY;  Surgeon: Alvino Chapel, MD;  Location: [facility];  Service: Gynecology;  Laterality: Bilateral;  . Omentectomy  12/13/2012    Procedure: OMENTECTOMY;  Surgeon: Alvino Chapel, MD;  Location: [facility];  Service: Gynecology;  Laterality: N/A;  . Colonoscopy N/A 02/14/2013    Procedure: COLONOSCOPY;  Surgeon: Danie Binder, MD;  Location: [facility];  Service: Endoscopy;  Laterality: N/A;  2:00 PM-moved to [place] notified pt  . Partial colectomy N/A 03/01/2013    Procedure: PARTIAL COLECTOMY;  Surgeon: Jamesetta So, MD;  Location: [facility];  Service: General;  Laterality: N/A;  . Portacath placement Left 03/01/2013    Procedure: INSERTION PORT-A-CATH;  Surgeon: Jamesetta So, MD;  Location: [facility];  Service: General;  Laterality: Left;    FAMILY HISTORY: family history includes Diabetes in her maternal grandmother, mother, and sister; Hypertension in her brother, maternal grandmother, mother, and sister; Stroke in her maternal aunt. There is no history of Colon cancer.  SOCIAL HISTORY:  reports that she quit smoking about 26 years ago. Her smoking use included Cigarettes. She has a 37.5 pack-year smoking history. She has never used smokeless tobacco. She reports that she drinks alcohol. She reports that she does not use illicit drugs.  REVIEW OF SYSTEMS:  Other than that discussed above is noncontributory.  PHYSICAL EXAMINATION: ECOG PERFORMANCE STATUS: 1 - Symptomatic but completely ambulatory  Blood pressure 173/91, pulse 71, temperature 98.2 F (36.8 C), temperature source Oral, resp. rate 18, weight 258 lb (117.028 kg), last menstrual period 10/07/2012.  GENERAL:alert, no distress and comfortable SKIN: skin color,  texture, turgor are normal, no rashes or significant lesions EYES: PERLA; Conjunctiva are pink and non-injected, sclera clear OROPHARYNX:no exudate, no erythema on lips, buccal mucosa, or tongue. SINUSES: Bilateral rhinorrhea with no pharyngeal injection. No evidence of otitis. NECK: supple, thyroid normal size, non-tender, without nodularity. No masses CHEST: Normal AP diameter with no breast masses. LifePort in place. LYMPH:  no palpable lymphadenopathy in the cervical, axillary or inguinal LUNGS: clear to auscultation and percussion with normal breathing effort HEART: regular rate & rhythm and no murmurs. ABDOMEN:abdomen soft, non-tender and normal bowel sounds. No distention. No hepatosplenomegaly, ascites, or CVA tenderness. MUSCULOSKELETAL:no cyanosis of digits and no clubbing. Range of motion normal.  NEURO: alert & oriented x 3 with fluent speech, no focal motor/sensory deficits   LABORATORY DATA: Infusion on 01/17/2014  Component Date Value Ref Range Status  . Specific Gravity, Urine 01/17/2014 >1.030* 1.005 - 1.030 Final  . pH 01/17/2014 6.0  5.0 - 8.0 Final  . Glucose, UA 01/17/2014 500* NEGATIVE mg/dL Final  . Hgb urine dipstick 01/17/2014 NEGATIVE  NEGATIVE Final  . Bilirubin Urine 01/17/2014 NEGATIVE  NEGATIVE Final  . Ketones, ur 01/17/2014 NEGATIVE  NEGATIVE mg/dL Final  . Protein, ur 01/17/2014 100* NEGATIVE mg/dL Final  . Urobilinogen, UA 01/17/2014 0.2  0.0 - 1.0 mg/dL Final  . Nitrite 01/17/2014 NEGATIVE  NEGATIVE Final  . Leukocytes, UA 01/17/2014 NEGATIVE  NEGATIVE Final  . WBC 01/17/2014 5.7  4.0 - 10.5 K/uL Final  . RBC 01/17/2014 4.58  3.87 - 5.11 MIL/uL Final  . Hemoglobin 01/17/2014 12.9  12.0 - 15.0 g/dL Final  .  HCT 01/17/2014 39.2  36.0 - 46.0 % Final  . MCV 01/17/2014 85.6  78.0 - 100.0 fL Final  . MCH 01/17/2014 28.2  26.0 - 34.0 pg Final  . MCHC 01/17/2014 32.9  30.0 - 36.0 g/dL Final  . RDW 01/17/2014 14.1  11.5 - 15.5 % Final  . Platelets  01/17/2014 121* 150 - 400 K/uL Final  . Neutrophils Relative % 01/17/2014 37* 43 - 77 % Final  . Neutro Abs 01/17/2014 2.1  1.7 - 7.7 K/uL Final  . Lymphocytes Relative 01/17/2014 54* 12 - 46 % Final  . Lymphs Abs 01/17/2014 3.1  0.7 - 4.0 K/uL Final  . Monocytes Relative 01/17/2014 6  3 - 12 % Final  . Monocytes Absolute 01/17/2014 0.3  0.1 - 1.0 K/uL Final  . Eosinophils Relative 01/17/2014 3  0 - 5 % Final  . Eosinophils Absolute 01/17/2014 0.2  0.0 - 0.7 K/uL Final  . Basophils Relative 01/17/2014 0  0 - 1 % Final  . Basophils Absolute 01/17/2014 0.0  0.0 - 0.1 K/uL Final  Infusion on 12/25/2013  Component Date Value Ref Range Status  . Specific Gravity, Urine 12/25/2013 >1.030* 1.005 - 1.030 Final  . pH 12/25/2013 6.0  5.0 - 8.0 Final  . Glucose, UA 12/25/2013 250* NEGATIVE mg/dL Final  . Hgb urine dipstick 12/25/2013 NEGATIVE  NEGATIVE Final  . Bilirubin Urine 12/25/2013 NEGATIVE  NEGATIVE Final  . Ketones, ur 12/25/2013 NEGATIVE  NEGATIVE mg/dL Final  . Protein, ur 12/25/2013 >300* NEGATIVE mg/dL Final  . Urobilinogen, UA 12/25/2013 0.2  0.0 - 1.0 mg/dL Final  . Nitrite 12/25/2013 NEGATIVE  NEGATIVE Final  . Leukocytes, UA 12/25/2013 NEGATIVE  NEGATIVE Final  . WBC 12/25/2013 7.3  4.0 - 10.5 K/uL Final  . RBC 12/25/2013 4.84  3.87 - 5.11 MIL/uL Final  . Hemoglobin 12/25/2013 14.1  12.0 - 15.0 g/dL Final  . HCT 12/25/2013 41.6  36.0 - 46.0 % Final  . MCV 12/25/2013 86.0  78.0 - 100.0 fL Final  . MCH 12/25/2013 29.1  26.0 - 34.0 pg Final  . MCHC 12/25/2013 33.9  30.0 - 36.0 g/dL Final  . RDW 12/25/2013 13.9  11.5 - 15.5 % Final  . Platelets 12/25/2013 151  150 - 400 K/uL Final  . Neutrophils Relative % 12/25/2013 50  43 - 77 % Final  . Neutro Abs 12/25/2013 3.7  1.7 - 7.7 K/uL Final  . Lymphocytes Relative 12/25/2013 42  12 - 46 % Final  . Lymphs Abs 12/25/2013 3.0  0.7 - 4.0 K/uL Final  . Monocytes Relative 12/25/2013 6  3 - 12 % Final  . Monocytes Absolute 12/25/2013 0.4   0.1 - 1.0 K/uL Final  . Eosinophils Relative 12/25/2013 3  0 - 5 % Final  . Eosinophils Absolute 12/25/2013 0.2  0.0 - 0.7 K/uL Final  . Basophils Relative 12/25/2013 0  0 - 1 % Final  . Basophils Absolute 12/25/2013 0.0  0.0 - 0.1 K/uL Final  . Sodium 12/25/2013 142  137 - 147 mEq/L Final  . Potassium 12/25/2013 3.8  3.7 - 5.3 mEq/L Final  . Chloride 12/25/2013 102  96 - 112 mEq/L Final  . CO2 12/25/2013 27  19 - 32 mEq/L Final  . Glucose, Bld 12/25/2013 237* 70 - 99 mg/dL Final  . BUN 12/25/2013 13  6 - 23 mg/dL Final  . Creatinine, Ser 12/25/2013 0.75  0.50 - 1.10 mg/dL Final  . Calcium 12/25/2013 9.3  8.4 - 10.5 mg/dL  Final  . Total Protein 12/25/2013 7.2  6.0 - 8.3 g/dL Final  . Albumin 30/70/3377 3.2* 3.5 - 5.2 g/dL Final  . AST 80/78/1278 13  0 - 37 U/L Final  . ALT 12/25/2013 11  0 - 35 U/L Final  . Alkaline Phosphatase 12/25/2013 62  39 - 117 U/L Final  . Total Bilirubin 12/25/2013 0.3  0.3 - 1.2 mg/dL Final  . GFR calc non Af Amer 12/25/2013 >90  >90 mL/min Final  . GFR calc Af Amer 12/25/2013 >90  >90 mL/min Final   Comment: (NOTE)                          The eGFR has been calculated using the CKD EPI equation.                          This calculation has not been validated in all clinical situations.                          eGFR's persistently <90 mL/min signify possible Chronic Kidney                          Disease.    PATHOLOGY: K-ras wild-type adenocarcinoma  Urinalysis    Component Value Date/Time   COLORURINE YELLOW 10/26/2012 2121   APPEARANCEUR CLEAR 10/26/2012 2121   LABSPEC >1.030* 01/17/2014 1106   PHURINE 6.0 01/17/2014 1106   GLUCOSEU 500* 01/17/2014 1106   HGBUR NEGATIVE 01/17/2014 1106   BILIRUBINUR NEGATIVE 01/17/2014 1106   KETONESUR NEGATIVE 01/17/2014 1106   PROTEINUR 100* 01/17/2014 1106   UROBILINOGEN 0.2 01/17/2014 1106   NITRITE NEGATIVE 01/17/2014 1106   LEUKOCYTESUR NEGATIVE 01/17/2014 1106    RADIOGRAPHIC STUDIES: Ct Chest W  Contrast  12/19/2013   CLINICAL DATA:  History of colon cancer.  Restaging examination.  EXAM: CT CHEST, ABDOMEN, AND PELVIS WITH CONTRAST  TECHNIQUE: Multidetector CT imaging of the chest, abdomen and pelvis was performed following the standard protocol during bolus administration of intravenous contrast.  CONTRAST:  100 mL OMNIPAQUE IOHEXOL 300 MG/ML  SOLN  COMPARISON:  CT chest, abdomen and pelvis 09/26/2013 and 06/20/2013.  FINDINGS: CT CHEST FINDINGS  Enlarged heterogeneous thyroid gland is again seen. The appearance is unchanged. There is no axillary, hilar or mediastinal lymphadenopathy. Heart size is mildly enlarged. No pleural or pericardial effusion. Focus of a few tiny micro nodules in the right lower lobe is again seen on image 28. The appearance is unchanged and likely due to prior infectious or inflammatory process. The lungs are otherwise unremarkable. No focal bony abnormality is identified.  CT ABDOMEN AND PELVIS FINDINGS  The liver, spleen, adrenal glands, biliary tree and kidneys are unremarkable. The gallbladder is decompressed and appears to have small stones within it. There is no pericholecystic inflammatory change.  Midline ventral hernia containing loops of bowel is unchanged. There is no obstruction or other complicating feature. The patient appears to be status post right hemicolectomy with a surgical anastomosis identified. Loop of colon at the anastomosis appears adhesed to the anterior abdominal wall but there is no obstruction. Previously seen left periaortic lymph node measuring 1.0 cm is unchanged on image 70. No lymphadenopathy or fluid collection is identified. Aortoiliac atherosclerosis is noted. No focal bony abnormality is identified.  IMPRESSION: Negative for recurrent or metastatic disease in the chest, abdomen or pelvis.  No acute abnormality.  No change in small retroperitoneal lymph nodes. No new or enlarging lymph nodes are identified.  No change in a midline ventral  hernia containing loops of bowel.  Atherosclerosis.   Electronically Signed   By: Inge Rise M.D.   On: 12/19/2013 16:02   Ct Abdomen Pelvis W Contrast  12/19/2013   CLINICAL DATA:  History of colon cancer.  Restaging examination.  EXAM: CT CHEST, ABDOMEN, AND PELVIS WITH CONTRAST  TECHNIQUE: Multidetector CT imaging of the chest, abdomen and pelvis was performed following the standard protocol during bolus administration of intravenous contrast.  CONTRAST:  100 mL OMNIPAQUE IOHEXOL 300 MG/ML  SOLN  COMPARISON:  CT chest, abdomen and pelvis 09/26/2013 and 06/20/2013.  FINDINGS: CT CHEST FINDINGS  Enlarged heterogeneous thyroid gland is again seen. The appearance is unchanged. There is no axillary, hilar or mediastinal lymphadenopathy. Heart size is mildly enlarged. No pleural or pericardial effusion. Focus of a few tiny micro nodules in the right lower lobe is again seen on image 28. The appearance is unchanged and likely due to prior infectious or inflammatory process. The lungs are otherwise unremarkable. No focal bony abnormality is identified.  CT ABDOMEN AND PELVIS FINDINGS  The liver, spleen, adrenal glands, biliary tree and kidneys are unremarkable. The gallbladder is decompressed and appears to have small stones within it. There is no pericholecystic inflammatory change.  Midline ventral hernia containing loops of bowel is unchanged. There is no obstruction or other complicating feature. The patient appears to be status post right hemicolectomy with a surgical anastomosis identified. Loop of colon at the anastomosis appears adhesed to the anterior abdominal wall but there is no obstruction. Previously seen left periaortic lymph node measuring 1.0 cm is unchanged on image 70. No lymphadenopathy or fluid collection is identified. Aortoiliac atherosclerosis is noted. No focal bony abnormality is identified.  IMPRESSION: Negative for recurrent or metastatic disease in the chest, abdomen or pelvis. No  acute abnormality.  No change in small retroperitoneal lymph nodes. No new or enlarging lymph nodes are identified.  No change in a midline ventral hernia containing loops of bowel.  Atherosclerosis.   Electronically Signed   By: Inge Rise M.D.   On: 12/19/2013 16:02    ASSESSMENT:  #1. Stage IV colon cancer with liver and intra-abdominal metastases, no evidence of disease except for small retroperitoneal lymph nodes on most recent CT scan, status post 12 cycles of FOLFOX plus Avastin with resultant peripheral neuropathy.  #2. Lower extremity neuropathy on gabapentin, stable.  #3. Uncontrolled hypertension due to noncompliance because of financial difficulties. #4. Symptomatic allergic rhinitis.   PLAN:  #1. Decrease Avastin dose to 7.5 mg per kilogram and continue Xeloda 1000 mg twice a day for 7 days on and 7 days off, currently in a rest week. #2. Loratadine 10 mg daily to help with nasal symptoms. #3. Followup in 3 weeks for continuation of treatment. Next CT scan is due in May of 2015.   All questions were answered. The patient knows to call the clinic with any problems, questions or concerns. We can certainly see the patient much sooner if necessary.   I spent 25 minutes counseling the patient face to face. The total time spent in the appointment was 30 minutes.    Doroteo Bradford, MD 01/17/2014 1:08 PM

## 2014-01-18 LAB — CEA: CEA: 2.7 ng/mL (ref 0.0–5.0)

## 2014-02-06 NOTE — Progress Notes (Signed)
[place], PA-C [address] [place] [address]  Invasive adenocarcinoma of colon  CURRENT THERAPY:Maintenance Xeloda/Avastin since November 2014. Xeloda is 1000 mg BID 7 days on and 7 days off.    INTERVAL HISTORY: Monica Allen 58 y.o. 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   Now on Avastin + Xeloda maintenance.    Invasive adenocarcinoma of colon   01/02/2013 Initial Diagnosis Left ovary and fallopian tube is positive for invasive adenocarcinoma with LVI and positive pelvis biopsy    02/14/2013 Procedure Ascending colon biopsy revealing invasive adenocarcinoma.  KRAS wildtype   03/01/2013 Surgery Left segmental resection showing invasive adenocarcinoma through the muscularis propria into the pericolonoc fat involving the serosa with LVI.  Clear margins.  11/19 positive lymph nodes for disease   04/04/2013 - 09/12/2013 Chemotherapy FOLFOX + Avastin x 12 cycles   10/02/2013 -  Chemotherapy Avastin + Xeloda maintenance.  Avstin dose decreased to 7.5 mg/kg on 3/1//2015 due to poorly controlled HTN.   I personally reviewed and went over laboratory results with the patient.  The results are noted within this dictation.  I personally reviewed and went over radiographic studies with the patient.  The results are noted within this dictation.    She reports "my belly is getting bigger."  I have reviewed her weight over the past few months.  Her weight is stable compared to last weight check, but over the past few months, her weight has increased.  It correlates nicely with the completion of FOLFOX chemotherapy and transitioning to maintenance therapy.  I suspect her appetite has improved and therefore, her weight has increased.  However, she is concerned about fluid in her abdomen and given her disease, I will order an limited US of abdomen to evaluate for malignant ascites.  Oncologically, she  otherwise denies any complaints and ROS questioning is negative.   Past Medical History  Diagnosis Date  . Diabetes mellitus   . Hypertension   . Enlarged heart   . Goiter   . Nonischemic cardiomyopathy   . Anemia   . Cancer     right colon- ovarian  . Invasive adenocarcinoma of colon 03/08/2013    11/19 LNs POS, THRU TO ADIPOSE TISSUE    has THYROMEGALY; DIABETES MELLITUS, UNCONTROLLED, WITH RENAL COMPLICATIONS; ANXIETY; DEPRESSION; HYPERTENSION; CARDIOMYOPATHY, DILATED; CONGESTIVE HEART FAILURE; SINUSITIS, CHRONIC; ALLERGIC RHINITIS; GERD; ONYCHIA AND PARONYCHIA OF TOE; LOW BACK PAIN; Diabetes mellitus; and Invasive adenocarcinoma of colon on her problem list.     is allergic to actos and ibuprofen.  Monica Allen does not currently have medications on file.  Past Surgical History  Procedure Laterality Date  . Wisdom tooth extraction    . Laparotomy  12/13/2012    Procedure: EXPLORATORY LAPAROTOMY;  Surgeon: Alvino Chapel, MD;  Location: [facility];  Service: Gynecology;  Laterality: N/A;  . Abdominal hysterectomy  12/13/2012    Procedure: HYSTERECTOMY ABDOMINAL;  Surgeon: Alvino Chapel, MD;  [facility]: [facility];  Service: Gynecology;  Laterality: N/A;  . Salpingoophorectomy  12/13/2012    Procedure: SALPINGO OOPHORECTOMY;  Surgeon: Alvino Chapel, MD;  [facility];  Service: Gynecology;  Laterality: Bilateral;  . Omentectomy  12/13/2012    Procedure: OMENTECTOMY;  Surgeon: Alvino Chapel, MD;  [facility];  Service: Gynecology;  Laterality: N/A;  . Colonoscopy N/A 02/14/2013    Procedure: COLONOSCOPY;  Surgeon: Danie Binder, MD;  [facility];  Service: Endoscopy;  Laterality: N/A;  2:00 PM-moved to [place] notified pt  . Partial colectomy N/A 03/01/2013    Procedure: PARTIAL COLECTOMY;  Surgeon: Jamesetta So, MD;  Location: [facility];  Service: General;  Laterality: N/A;  . Portacath placement Left 03/01/2013    Procedure: INSERTION  PORT-A-CATH;  Surgeon: Jamesetta So, MD;  Location: [facility];  Service: General;  Laterality: Left;    Denies any headaches, dizziness, double vision, fevers, chills, night sweats, nausea, vomiting, diarrhea, constipation, chest pain, heart palpitations, shortness of breath, blood in stool, black tarry stool, urinary pain, urinary burning, urinary frequency, hematuria.   PHYSICAL EXAMINATION  ECOG PERFORMANCE STATUS: 1 - Symptomatic but completely ambulatory  There were no vitals filed for this visit.  GENERAL:alert, no distress, well nourished, well developed, comfortable, cooperative, obese and smiling SKIN: skin color, texture, turgor are normal, no rashes or significant lesions HEAD: Normocephalic, No masses, lesions, tenderness or abnormalities EYES: normal, PERRLA, EOMI, Conjunctiva are pink and non-injected EARS: External ears normal OROPHARYNX:mucous membranes are moist  NECK: supple, trachea midline LYMPH:  no palpable lymphadenopathy, no hepatosplenomegaly BREAST:not examined LUNGS: clear to auscultation  HEART: regular rate & rhythm, no murmurs and no gallops ABDOMEN:non-tender, obese, normal bowel sounds and distended but compressible. BACK: Back symmetric, no curvature. EXTREMITIES:less then 2 second capillary refill, no joint deformities, effusion, or inflammation, no skin discoloration, no clubbing, no cyanosis, positive findings:  edema 1 + pitting edema B/L in LE  NEURO: alert & oriented x 3 with fluent speech, no focal motor/sensory deficits, gait normal   LABORATORY DATA: CBC    Component Value Date/Time   WBC 5.7 01/17/2014 1106   RBC 4.58 01/17/2014 1106   HGB 12.9 01/17/2014 1106   HCT 39.2 01/17/2014 1106   PLT 121* 01/17/2014 1106   MCV 85.6 01/17/2014 1106   MCH 28.2 01/17/2014 1106   MCHC 32.9 01/17/2014 1106   RDW 14.1 01/17/2014 1106   LYMPHSABS 3.1 01/17/2014 1106   MONOABS 0.3 01/17/2014 1106   EOSABS 0.2 01/17/2014 1106   BASOSABS 0.0 01/17/2014 1106       Chemistry      Component Value Date/Time   NA 140 01/17/2014 1106   K 4.3 01/17/2014 1106   CL 102 01/17/2014 1106   CO2 25 01/17/2014 1106   BUN 18 01/17/2014 1106   CREATININE 0.62 01/17/2014 1106      Component Value Date/Time   CALCIUM 9.1 01/17/2014 1106   ALKPHOS 58 01/17/2014 1106   AST 17 01/17/2014 1106   ALT 13 01/17/2014 1106   BILITOT 0.3 01/17/2014 1106     Lab Results  Component Value Date   CEA 2.7 01/17/2014      ASSESSMENT:  1. Stage IV colon cancer with liver and intra-abdominal metastases, no evidence of disease except for small retroperitoneal lymph nodes on most recent CT scan, status post 12 cycles of FOLFOX plus Avastin with resultant peripheral neuropathy.  2. Lower extremity neuropathy on gabapentin, stable.  3. Uncontrolled hypertension due to noncompliance because of financial difficulties. 4. Abdominal enlargement, suspect increased weight from eating, unlikely ascites  Patient Active Problem List   Diagnosis Date Noted  . Invasive adenocarcinoma of colon 03/08/2013  . Diabetes mellitus 12/16/2012  . ONYCHIA AND PARONYCHIA OF TOE 05/29/2009  . DIABETES MELLITUS, UNCONTROLLED, WITH RENAL COMPLICATIONS 63/14/9702  . 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 ANXIETY 10/03/2007  . DEPRESSION 10/03/2007  . HYPERTENSION 10/03/2007  . CARDIOMYOPATHY, DILATED 10/03/2007  .  CONGESTIVE HEART FAILURE 10/03/2007  . SINUSITIS, CHRONIC 10/03/2007  . ALLERGIC RHINITIS 10/03/2007  . GERD 10/03/2007  . LOW BACK PAIN 10/03/2007     PLAN:  1. I personally reviewed and went over laboratory results with the patient.  The results are noted within this dictation. 2. Restaging CT scans in May 2015.  Will be ordered and scheduled in future appointment. 3. Pre-chemo labs: CBC diff, CMET, CEA every other cycle 4. Continue with Avastin as scheduled 5. Continue same Xeloda regimen and dose 6. [place] abd limited, evaluate for malignant ascites. 7. Return in 3 weeks for  follow-up   THERAPY PLAN:  Shaquille is to continue with chemotherapy as planned.  She is encouraged to make healthy eating choices.    All questions were answered. The patient knows to call the clinic with any problems, questions or concerns. We can certainly see the patient much sooner if necessary.  Patient and plan discussed with Dr. Farrel Gobble and he is in agreement with the aforementioned.   Monica Allen 02/07/2014

## 2014-02-07 ENCOUNTER — Encounter (HOSPITAL_BASED_OUTPATIENT_CLINIC_OR_DEPARTMENT_OTHER): Payer: BC Managed Care – PPO

## 2014-02-07 ENCOUNTER — Encounter (HOSPITAL_COMMUNITY): Payer: BC Managed Care – PPO | Attending: Oncology | Admitting: Oncology

## 2014-02-07 ENCOUNTER — Ambulatory Visit (HOSPITAL_COMMUNITY)
Admission: RE | Admit: 2014-02-07 | Discharge: 2014-02-07 | Disposition: A | Discharge status: Home / Self Care | Payer: BC Managed Care – PPO | Source: Ambulatory Visit | Attending: Oncology | Admitting: Oncology

## 2014-02-07 ENCOUNTER — Ambulatory Visit (HOSPITAL_COMMUNITY): Payer: BC Managed Care – PPO

## 2014-02-07 VITALS — BP 169/86 | HR 64 | Temp 97.5°F | Resp 18 | Wt 257.8 lb

## 2014-02-07 DIAGNOSIS — C772 Secondary and unspecified malignant neoplasm of intra-abdominal lymph nodes: Secondary | ICD-10-CM

## 2014-02-07 DIAGNOSIS — R142 Eructation: Principal | ICD-10-CM | POA: Insufficient documentation

## 2014-02-07 DIAGNOSIS — R141 Gas pain: Secondary | ICD-10-CM | POA: Insufficient documentation

## 2014-02-07 DIAGNOSIS — Z85038 Personal history of other malignant neoplasm of large intestine: Secondary | ICD-10-CM | POA: Insufficient documentation

## 2014-02-07 DIAGNOSIS — R143 Flatulence: Principal | ICD-10-CM

## 2014-02-07 DIAGNOSIS — G609 Hereditary and idiopathic neuropathy, unspecified: Secondary | ICD-10-CM | POA: Insufficient documentation

## 2014-02-07 DIAGNOSIS — R140 Abdominal distension (gaseous): Secondary | ICD-10-CM

## 2014-02-07 DIAGNOSIS — C787 Secondary malignant neoplasm of liver and intrahepatic bile duct: Secondary | ICD-10-CM

## 2014-02-07 DIAGNOSIS — Z5112 Encounter for antineoplastic immunotherapy: Secondary | ICD-10-CM

## 2014-02-07 DIAGNOSIS — E119 Type 2 diabetes mellitus without complications: Secondary | ICD-10-CM | POA: Insufficient documentation

## 2014-02-07 DIAGNOSIS — C189 Malignant neoplasm of colon, unspecified: Secondary | ICD-10-CM

## 2014-02-07 DIAGNOSIS — R1900 Intra-abdominal and pelvic swelling, mass and lump, unspecified site: Secondary | ICD-10-CM

## 2014-02-07 LAB — URINALYSIS, DIPSTICK ONLY
Bilirubin Urine: NEGATIVE
Glucose, UA: NEGATIVE mg/dL
Hgb urine dipstick: NEGATIVE
Ketones, ur: NEGATIVE mg/dL
Leukocytes, UA: NEGATIVE
Nitrite: NEGATIVE
PH: 6 (ref 5.0–8.0)
PROTEIN: 30 mg/dL — AB
SPECIFIC GRAVITY, URINE: 1.025 (ref 1.005–1.030)
UROBILINOGEN UA: 0.2 mg/dL (ref 0.0–1.0)

## 2014-02-07 LAB — CBC WITH DIFFERENTIAL/PLATELET
BASOS PCT: 0 % (ref 0–1)
Basophils Absolute: 0 10*3/uL (ref 0.0–0.1)
Eosinophils Absolute: 0.2 10*3/uL (ref 0.0–0.7)
Eosinophils Relative: 3 % (ref 0–5)
HEMATOCRIT: 40.7 % (ref 36.0–46.0)
Hemoglobin: 13.2 g/dL (ref 12.0–15.0)
Lymphocytes Relative: 54 % — ABNORMAL HIGH (ref 12–46)
Lymphs Abs: 3.2 10*3/uL (ref 0.7–4.0)
MCH: 28 pg (ref 26.0–34.0)
MCHC: 32.4 g/dL (ref 30.0–36.0)
MCV: 86.2 fL (ref 78.0–100.0)
MONO ABS: 0.4 10*3/uL (ref 0.1–1.0)
MONOS PCT: 7 % (ref 3–12)
Neutro Abs: 2.2 10*3/uL (ref 1.7–7.7)
Neutrophils Relative %: 36 % — ABNORMAL LOW (ref 43–77)
Platelets: 154 10*3/uL (ref 150–400)
RBC: 4.72 MIL/uL (ref 3.87–5.11)
RDW: 14.1 % (ref 11.5–15.5)
WBC: 6 10*3/uL (ref 4.0–10.5)

## 2014-02-07 LAB — COMPREHENSIVE METABOLIC PANEL
ALT: 13 U/L (ref 0–35)
AST: 17 U/L (ref 0–37)
Albumin: 3.2 g/dL — ABNORMAL LOW (ref 3.5–5.2)
Alkaline Phosphatase: 60 U/L (ref 39–117)
BILIRUBIN TOTAL: 0.3 mg/dL (ref 0.3–1.2)
BUN: 19 mg/dL (ref 6–23)
CO2: 29 mEq/L (ref 19–32)
CREATININE: 1.01 mg/dL (ref 0.50–1.10)
Calcium: 9.4 mg/dL (ref 8.4–10.5)
Chloride: 101 mEq/L (ref 96–112)
GFR, EST AFRICAN AMERICAN: 71 mL/min — AB (ref >90)
GFR, EST NON AFRICAN AMERICAN: 61 mL/min — AB (ref >90)
Glucose, Bld: 215 mg/dL — ABNORMAL HIGH (ref 70–99)
Potassium: 4.1 mEq/L (ref 3.7–5.3)
Sodium: 142 mEq/L (ref 137–147)
Total Protein: 7.1 g/dL (ref 6.0–8.3)

## 2014-02-07 MED ORDER — SODIUM CHLORIDE 0.9 % IV SOLN
7.5000 mg/kg | Freq: Once | INTRAVENOUS | Status: AC
  Administered 2014-02-07: 800 mg via INTRAVENOUS
  Filled 2014-02-07: qty 32

## 2014-02-07 MED ORDER — SODIUM CHLORIDE 0.9 % IJ SOLN
10.0000 mL | INTRAMUSCULAR | Status: DC | PRN
  Administered 2014-02-07: 10 mL

## 2014-02-07 MED ORDER — HEPARIN SOD (PORK) LOCK FLUSH 100 UNIT/ML IV SOLN
500.0000 [IU] | Freq: Once | INTRAVENOUS | Status: AC | PRN
  Administered 2014-02-07: 500 [IU]
  Filled 2014-02-07: qty 5

## 2014-02-07 MED ORDER — SODIUM CHLORIDE 0.9 % IV SOLN
Freq: Once | INTRAVENOUS | Status: AC
  Administered 2014-02-07: 12:00:00 via INTRAVENOUS

## 2014-02-07 NOTE — Patient Instructions (Signed)
[facility] Discharge Instructions  RECOMMENDATIONS MADE BY THE CONSULTANT AND ANY TEST RESULTS WILL BE SENT TO YOUR REFERRING PHYSICIAN.  MEDICATIONS PRESCRIBED:  None  INSTRUCTIONS GIVEN AND DISCUSSED: Continue with Xeloda (chemotherapy pills) at the same dose and same schedule.  1000 mg twice daily for 7 days and then 7 days off. We will continue with IV chemotherapy (Avastin) as scheduled.  SPECIAL INSTRUCTIONS/FOLLOW-UP: After the completion of chemotherapy today, we will complete an ultrasound of abdomen to evaluate for any fluid in the abdomen that may need removed.  If there is no fluid, we encourage you to make healthy eating choices to help maintain and loose weight.  Thank you for choosing [facility] to provide your oncology and hematology care.  To afford each patient quality time with our providers, please arrive at least 15 minutes before your scheduled appointment time.  With your help, our goal is to use those 15 minutes to complete the necessary work-up to ensure our physicians have the information they need to help with your evaluation and healthcare recommendations.    Effective January 1st, 2014, we ask that you re-schedule your appointment with our physicians should you arrive 10 or more minutes late for your appointment.  We strive to give you quality time with our providers, and arriving late affects you and other patients whose appointments are after yours.    Again, thank you for choosing [facility].  Our hope is that these requests will decrease the amount of time that you wait before being seen by our physicians.       _____________________________________________________________  Should you have questions after your visit to [facility], please contact our office at [phone] between the hours of 8:30 a.m. and 5:00 p.m.  Voicemails left after 4:30 p.m. will not be returned until the following  business day.  For prescription refill requests, have your pharmacy contact our office with your prescription refill request.

## 2014-02-25 NOTE — Progress Notes (Signed)
[name], PA-C [address] [place] [address]  Invasive adenocarcinoma of colon - Plan: CT Chest W Contrast, CT Abdomen Pelvis W Contrast, Urinalysis, dipstick only, capecitabine (XELODA) 500 MG tablet, CANCELED: Urinalysis, dipstick only  Peripheral neuropathy - Plan: capecitabine (XELODA) 500 MG tablet  Diabetes mellitus - Plan: capecitabine (XELODA) 500 MG tablet  CURRENT THERAPY: Maintenance Xeloda/Avastin since November 2014. Xeloda is 1000 mg BID 7 days on and 7 days off.   INTERVAL HISTORY: Monica Allen 58 y.o. 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 Now on Avastin + Xeloda maintenance.    Invasive adenocarcinoma of colon   01/02/2013 Initial Diagnosis Left ovary and fallopian tube is positive for invasive adenocarcinoma with LVI and positive pelvis biopsy    02/14/2013 Procedure Ascending colon biopsy revealing invasive adenocarcinoma.  KRAS wildtype   03/01/2013 Surgery Left segmental resection showing invasive adenocarcinoma through the muscularis propria into the pericolonoc fat involving the serosa with LVI.  Clear margins.  11/19 positive lymph nodes for disease   04/04/2013 - 09/12/2013 Chemotherapy FOLFOX + Avastin x 12 cycles   10/02/2013 -  Chemotherapy Avastin + Xeloda maintenance.  Avstin dose decreased to 7.5 mg/kg on 3/1//2015 due to poorly controlled HTN.    I personally reviewed and went over laboratory results with the patient.  The results are noted within this dictation.  I personally reviewed and went over radiographic studies with the patient.  The results are noted within this dictation.  [place] of abd was negative on 4/1 for any malignant ascites.  She reports that she needs a refill on her Xeloda.  An Rx was printed for there patient and submitted to [facility].  She reports that [facility] can no longer fill her Rx due to insurance issues.   She is  tolerating therapy well.  She is due for restaging scans.  Orders are placed and due for scheduling.    She reports that she received a letter from Dr. Oneida Alar reporting that the patient is due for follow-up.  She asks if she needs to go.  I am unsure what this follow-up is regarding and therefore, it is absolutely reasonable to follow-up with GI as requested.   Oncologically, she denies any complaints and ROS questioning is negative.    Past Medical History  Diagnosis Date  . Diabetes mellitus   . Hypertension   . Enlarged heart   . Goiter   . Nonischemic cardiomyopathy   . Anemia   . Cancer     right colon- ovarian  . Invasive adenocarcinoma of colon 03/08/2013    11/19 LNs POS, THRU TO ADIPOSE TISSUE    has THYROMEGALY; DIABETES MELLITUS, UNCONTROLLED, WITH RENAL COMPLICATIONS; ANXIETY; DEPRESSION; HYPERTENSION; CARDIOMYOPATHY, DILATED; CONGESTIVE HEART FAILURE; SINUSITIS, CHRONIC; ALLERGIC RHINITIS; GERD; ONYCHIA AND PARONYCHIA OF TOE; LOW BACK PAIN; Diabetes mellitus; and Invasive adenocarcinoma of colon on her problem list.     is allergic to actos and ibuprofen.  Ms. Mcinerney had no medications administered during this visit.  Past Surgical History  Procedure Laterality Date  . Wisdom tooth extraction    . Laparotomy  12/13/2012    Procedure: EXPLORATORY LAPAROTOMY;  Surgeon: Alvino Chapel, MD;  [facility];  Service: Gynecology;  Laterality: N/A;  . Abdominal hysterectomy  12/13/2012    Procedure: HYSTERECTOMY ABDOMINAL;  Surgeon: Alvino Chapel, MD;  [facility];  Service: Gynecology;  Laterality: N/A;  .  Salpingoophorectomy  12/13/2012    Procedure: SALPINGO OOPHORECTOMY;  Surgeon: Alvino Chapel, MD;  [facility];  Service: Gynecology;  Laterality: Bilateral;  . Omentectomy  12/13/2012    Procedure: OMENTECTOMY;  Surgeon: Alvino Chapel, MD;  [facility];  Service: Gynecology;  Laterality: N/A;  . Colonoscopy N/A 02/14/2013      Procedure: COLONOSCOPY;  Surgeon: Danie Binder, MD;  [facility];  Service: Endoscopy;  Laterality: N/A;  2:00 PM-moved to [place] notified pt  . Partial colectomy N/A 03/01/2013    Procedure: PARTIAL COLECTOMY;  Surgeon: Jamesetta So, MD;  Location: [facility];  Service: General;  Laterality: N/A;  . Portacath placement Left 03/01/2013    Procedure: INSERTION PORT-A-CATH;  Surgeon: Jamesetta So, MD;  Location: [facility];  Service: General;  Laterality: Left;    Denies any headaches, dizziness, double vision, fevers, chills, night sweats, nausea, vomiting, diarrhea, constipation, chest pain, heart palpitations, shortness of breath, blood in stool, black tarry stool, urinary pain, urinary burning, urinary frequency, hematuria.   PHYSICAL EXAMINATION  ECOG PERFORMANCE STATUS: 1 - Symptomatic but completely ambulatory  Filed Vitals:   02/28/14 1121  BP: 176/77  Pulse: 56  Temp:   Resp:     GENERAL:alert, no distress, well nourished, well developed, comfortable, cooperative, obese and smiling SKIN: skin color, texture, turgor are normal, no rashes or significant lesions HEAD: Normocephalic, No masses, lesions, tenderness or abnormalities EYES: normal, PERRLA, EOMI, Conjunctiva are pink and non-injected EARS: External ears normal OROPHARYNX:mucous membranes are moist  NECK: supple, trachea midline LYMPH:  no hepatosplenomegaly BREAST:not examined LUNGS: clear to auscultation  HEART: regular rate & rhythm, no murmurs and no gallops ABDOMEN:abdomen soft, obese, normal bowel sounds and no masses or organomegaly BACK: Back symmetric, no curvature. EXTREMITIES:less then 2 second capillary refill, no joint deformities, effusion, or inflammation, no skin discoloration, no clubbing, no cyanosis  NEURO: alert & oriented x 3 with fluent speech, no focal motor/sensory deficits, gait normal   LABORATORY DATA: CBC    Component Value Date/Time   WBC 6.0 02/07/2014 1100   RBC 4.72  02/07/2014 1100   HGB 13.2 02/07/2014 1100   HCT 40.7 02/07/2014 1100   PLT 154 02/07/2014 1100   MCV 86.2 02/07/2014 1100   MCH 28.0 02/07/2014 1100   MCHC 32.4 02/07/2014 1100   RDW 14.1 02/07/2014 1100   LYMPHSABS 3.2 02/07/2014 1100   MONOABS 0.4 02/07/2014 1100   EOSABS 0.2 02/07/2014 1100   BASOSABS 0.0 02/07/2014 1100      Chemistry      Component Value Date/Time   NA 142 02/07/2014 1100   K 4.1 02/07/2014 1100   CL 101 02/07/2014 1100   CO2 29 02/07/2014 1100   BUN 19 02/07/2014 1100   CREATININE 1.01 02/07/2014 1100      Component Value Date/Time   CALCIUM 9.4 02/07/2014 1100   ALKPHOS 60 02/07/2014 1100   AST 17 02/07/2014 1100   ALT 13 02/07/2014 1100   BILITOT 0.3 02/07/2014 1100     Lab Results  Component Value Date   CEA 2.7 01/17/2014     RADIOGRAPHIC STUDIES:  02/07/2014  CLINICAL DATA: 58 year old female abdominal distention. History of  colon cancer. Query ascites. Initial encounter.  EXAM:  LIMITED ABDOMEN ULTRASOUND FOR ASCITES  TECHNIQUE:  Limited ultrasound survey for ascites was performed in all four  abdominal quadrants.  COMPARISON: Chest CT and CT Abdomen and Pelvis 12/19/2013.  FINDINGS:  No abdominal free  fluid.  IMPRESSION:  Negative for ascites.  Electronically Signed  By: Lars Pinks M.D.  On: 02/07/2014 15:28    ASSESSMENT:  1. Stage IV colon cancer with liver and intra-abdominal metastases, no evidence of disease except for small retroperitoneal lymph nodes on most recent CT scan, status post 12 cycles of FOLFOX plus Avastin with resultant peripheral neuropathy. Now on Avastin + Xeloda maintenance. 2. Lower extremity neuropathy on gabapentin, stable.  3. Uncontrolled hypertension due to noncompliance because of financial difficulties.  Patient Active Problem List   Diagnosis Date Noted  . Invasive adenocarcinoma of colon 03/08/2013  . Diabetes mellitus 12/16/2012  . ONYCHIA AND PARONYCHIA OF TOE 05/29/2009  . DIABETES MELLITUS, UNCONTROLLED, WITH RENAL COMPLICATIONS  62/01/5596  . 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 ANXIETY 10/03/2007  . DEPRESSION 10/03/2007  . HYPERTENSION 10/03/2007  . CARDIOMYOPATHY, DILATED 10/03/2007  . CONGESTIVE HEART FAILURE 10/03/2007  . SINUSITIS, CHRONIC 10/03/2007  . ALLERGIC RHINITIS 10/03/2007  . GERD 10/03/2007  . LOW BACK PAIN 10/03/2007     PLAN:  1. I personally reviewed and went over laboratory results with the patient.  The results are noted within this dictation. 2. I personally reviewed and went over radiographic studies with the patient.  The results are noted within this dictation.   3. Restaging CT CAP with contrast  4. Pre-chemo labs: CBC diff, CMET, UA  5. Labs every 3 weeks: CEA 6. Rx for Xeloda 7 days on and 7 days off at 1000 mg BID 7. Return in 3 weeks to review CT scans and follow-up   THERAPY PLAN:  We will restage the patient in 2-3 weeks with CT CAP.  She will continue with chemotherapy as planned.  All questions were answered. The patient knows to call the clinic with any problems, questions or concerns. We can certainly see the patient much sooner if necessary.  Patient and plan discussed with Dr. Farrel Gobble and he is in agreement with the aforementioned.   [facility] 02/28/2014

## 2014-02-28 ENCOUNTER — Encounter (HOSPITAL_BASED_OUTPATIENT_CLINIC_OR_DEPARTMENT_OTHER): Payer: BC Managed Care – PPO

## 2014-02-28 ENCOUNTER — Ambulatory Visit (HOSPITAL_COMMUNITY): Payer: BC Managed Care – PPO

## 2014-02-28 ENCOUNTER — Encounter (HOSPITAL_BASED_OUTPATIENT_CLINIC_OR_DEPARTMENT_OTHER): Payer: BC Managed Care – PPO | Admitting: Oncology

## 2014-02-28 VITALS — BP 176/77 | HR 56 | Temp 97.8°F | Resp 20 | Wt 261.0 lb

## 2014-02-28 VITALS — BP 153/87 | HR 61

## 2014-02-28 DIAGNOSIS — G609 Hereditary and idiopathic neuropathy, unspecified: Secondary | ICD-10-CM

## 2014-02-28 DIAGNOSIS — C787 Secondary malignant neoplasm of liver and intrahepatic bile duct: Secondary | ICD-10-CM

## 2014-02-28 DIAGNOSIS — C189 Malignant neoplasm of colon, unspecified: Secondary | ICD-10-CM

## 2014-02-28 DIAGNOSIS — C182 Malignant neoplasm of ascending colon: Secondary | ICD-10-CM

## 2014-02-28 DIAGNOSIS — G629 Polyneuropathy, unspecified: Secondary | ICD-10-CM

## 2014-02-28 DIAGNOSIS — Z5112 Encounter for antineoplastic immunotherapy: Secondary | ICD-10-CM

## 2014-02-28 DIAGNOSIS — E119 Type 2 diabetes mellitus without complications: Secondary | ICD-10-CM

## 2014-02-28 DIAGNOSIS — C786 Secondary malignant neoplasm of retroperitoneum and peritoneum: Secondary | ICD-10-CM

## 2014-02-28 LAB — COMPREHENSIVE METABOLIC PANEL
ALBUMIN: 3.3 g/dL — AB (ref 3.5–5.2)
ALK PHOS: 57 U/L (ref 39–117)
ALT: 11 U/L (ref 0–35)
AST: 15 U/L (ref 0–37)
BUN: 17 mg/dL (ref 6–23)
CO2: 26 mEq/L (ref 19–32)
CREATININE: 0.93 mg/dL (ref 0.50–1.10)
Calcium: 9.4 mg/dL (ref 8.4–10.5)
Chloride: 102 mEq/L (ref 96–112)
GFR calc non Af Amer: 67 mL/min — ABNORMAL LOW (ref >90)
GFR, EST AFRICAN AMERICAN: 78 mL/min — AB (ref >90)
GLUCOSE: 232 mg/dL — AB (ref 70–99)
POTASSIUM: 4 meq/L (ref 3.7–5.3)
Sodium: 141 mEq/L (ref 137–147)
TOTAL PROTEIN: 7 g/dL (ref 6.0–8.3)
Total Bilirubin: 0.2 mg/dL — ABNORMAL LOW (ref 0.3–1.2)

## 2014-02-28 LAB — CBC WITH DIFFERENTIAL/PLATELET
BASOS PCT: 0 % (ref 0–1)
Basophils Absolute: 0 10*3/uL (ref 0.0–0.1)
EOS ABS: 0.1 10*3/uL (ref 0.0–0.7)
EOS PCT: 2 % (ref 0–5)
HCT: 38.6 % (ref 36.0–46.0)
Hemoglobin: 13 g/dL (ref 12.0–15.0)
Lymphocytes Relative: 47 % — ABNORMAL HIGH (ref 12–46)
Lymphs Abs: 2.9 10*3/uL (ref 0.7–4.0)
MCH: 28.6 pg (ref 26.0–34.0)
MCHC: 33.7 g/dL (ref 30.0–36.0)
MCV: 84.8 fL (ref 78.0–100.0)
Monocytes Absolute: 0.5 10*3/uL (ref 0.1–1.0)
Monocytes Relative: 8 % (ref 3–12)
NEUTROS PCT: 43 % (ref 43–77)
Neutro Abs: 2.6 10*3/uL (ref 1.7–7.7)
Platelets: 138 10*3/uL — ABNORMAL LOW (ref 150–400)
RBC: 4.55 MIL/uL (ref 3.87–5.11)
RDW: 14.6 % (ref 11.5–15.5)
WBC: 6 10*3/uL (ref 4.0–10.5)

## 2014-02-28 LAB — URINALYSIS, DIPSTICK ONLY
BILIRUBIN URINE: NEGATIVE
Glucose, UA: 250 mg/dL — AB
Hgb urine dipstick: NEGATIVE
Ketones, ur: NEGATIVE mg/dL
Leukocytes, UA: NEGATIVE
NITRITE: NEGATIVE
PROTEIN: 30 mg/dL — AB
Specific Gravity, Urine: >1.03 — ABNORMAL HIGH (ref 1.005–1.030)
UROBILINOGEN UA: 0.2 mg/dL (ref 0.0–1.0)
pH: 5.5 (ref 5.0–8.0)

## 2014-02-28 MED ORDER — CAPECITABINE 500 MG PO TABS: 1000.0000 mg | ORAL_TABLET | Freq: Two times a day (BID) | ORAL | Status: DC

## 2014-02-28 MED ORDER — HEPARIN SOD (PORK) LOCK FLUSH 100 UNIT/ML IV SOLN
500.0000 [IU] | Freq: Once | INTRAVENOUS | Status: AC | PRN
  Administered 2014-02-28: 500 [IU]
  Filled 2014-02-28: qty 5

## 2014-02-28 MED ORDER — BEVACIZUMAB CHEMO INJECTION 400 MG/16ML
7.5000 mg/kg | Freq: Once | INTRAVENOUS | Status: AC
  Administered 2014-02-28: 800 mg via INTRAVENOUS
  Filled 2014-02-28: qty 32

## 2014-02-28 MED ORDER — SODIUM CHLORIDE 0.9 % IV SOLN
Freq: Once | INTRAVENOUS | Status: AC
  Administered 2014-02-28: 12:00:00 via INTRAVENOUS

## 2014-02-28 MED ORDER — SODIUM CHLORIDE 0.9 % IJ SOLN
10.0000 mL | INTRAMUSCULAR | Status: DC | PRN
  Administered 2014-02-28: 10 mL

## 2014-02-28 NOTE — Progress Notes (Signed)
Tolerated chemo well. 

## 2014-02-28 NOTE — Patient Instructions (Signed)
[facility] Discharge Instructions  RECOMMENDATIONS MADE BY THE CONSULTANT AND ANY TEST RESULTS WILL BE SENT TO YOUR REFERRING PHYSICIAN.  You will have a CT scan in the next 2 weeks. Xeloda prescription was given to Angie and she will get in touch with you.   Thank you for choosing [facility] to provide your oncology and hematology care.  To afford each patient quality time with our providers, please arrive at least 15 minutes before your scheduled appointment time.  With your help, our goal is to use those 15 minutes to complete the necessary work-up to ensure our physicians have the information they need to help with your evaluation and healthcare recommendations.    Effective January 1st, 2014, we ask that you re-schedule your appointment with our physicians should you arrive 10 or more minutes late for your appointment.  We strive to give you quality time with our providers, and arriving late affects you and other patients whose appointments are after yours.    Again, thank you for choosing [facility].  Our hope is that these requests will decrease the amount of time that you wait before being seen by our physicians.       _____________________________________________________________  Should you have questions after your visit to [facility], please contact our office at [phone] between the hours of 8:30 a.m. and 5:00 p.m.  Voicemails left after 4:30 p.m. will not be returned until the following business day.  For prescription refill requests, have your pharmacy contact our office with your prescription refill request.

## 2014-03-01 LAB — CEA: CEA: 2.2 ng/mL (ref 0.0–5.0)

## 2014-03-14 ENCOUNTER — Ambulatory Visit (HOSPITAL_COMMUNITY): Payer: BC Managed Care – PPO

## 2014-03-14 ENCOUNTER — Ambulatory Visit (HOSPITAL_COMMUNITY)
Admission: RE | Admit: 2014-03-14 | Discharge: 2014-03-14 | Disposition: A | Discharge status: Home / Self Care | Payer: BC Managed Care – PPO | Source: Ambulatory Visit | Attending: Oncology | Admitting: Oncology

## 2014-03-14 ENCOUNTER — Ambulatory Visit (HOSPITAL_COMMUNITY): Admission: RE | Admit: 2014-03-14 | Payer: BC Managed Care – PPO | Source: Ambulatory Visit

## 2014-03-14 ENCOUNTER — Encounter (HOSPITAL_COMMUNITY): Payer: Self-pay

## 2014-03-14 DIAGNOSIS — C19 Malignant neoplasm of rectosigmoid junction: Secondary | ICD-10-CM | POA: Insufficient documentation

## 2014-03-14 DIAGNOSIS — K439 Ventral hernia without obstruction or gangrene: Secondary | ICD-10-CM | POA: Insufficient documentation

## 2014-03-14 DIAGNOSIS — C189 Malignant neoplasm of colon, unspecified: Secondary | ICD-10-CM

## 2014-03-14 DIAGNOSIS — Z9221 Personal history of antineoplastic chemotherapy: Secondary | ICD-10-CM | POA: Insufficient documentation

## 2014-03-14 DIAGNOSIS — Z9049 Acquired absence of other specified parts of digestive tract: Secondary | ICD-10-CM | POA: Insufficient documentation

## 2014-03-14 MED ORDER — IOHEXOL 300 MG/ML  SOLN
100.0000 mL | Freq: Once | INTRAMUSCULAR | Status: AC | PRN
  Administered 2014-03-14: 100 mL via INTRAVENOUS

## 2014-03-19 NOTE — Progress Notes (Signed)
[name], PA-C [address] [place] [address]  Invasive adenocarcinoma of colon  CURRENT THERAPY: Maintenance Xeloda and Avastin since November 2014. Xeloda is 1000 mg BID 7 days on and 7 days off.    INTERVAL HISTORY: Monica Allen 58 y.o. 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 Now on Avastin + Xeloda maintenance.    Invasive adenocarcinoma of colon   01/02/2013 Initial Diagnosis Left ovary and fallopian tube is positive for invasive adenocarcinoma with LVI and positive pelvis biopsy    02/14/2013 Procedure Ascending colon biopsy revealing invasive adenocarcinoma.  KRAS wildtype   03/01/2013 Surgery Left segmental resection showing invasive adenocarcinoma through the muscularis propria into the pericolonoc fat involving the serosa with LVI.  Clear margins.  11/19 positive lymph nodes for disease   04/04/2013 - 09/12/2013 Chemotherapy FOLFOX + Avastin x 12 cycles   09/26/2013 Imaging CT CAP- No definite signs of metastatic disease in the chest, abdomen or pelvis   10/02/2013 -  Chemotherapy Avastin + Xeloda maintenance.  Avstin dose decreased to 7.5 mg/kg on 3/1//2015 due to poorly controlled HTN.   12/21/2013 Imaging CT CAP- Negative for recurrent or metastatic disease in the chest, abdomen or pelvis.    03/14/2014 Imaging CT CAP- No evidence of thoracic metastasis. No evidence metastasis within the abdomen or pelvis.   I personally reviewed and went over laboratory results with the patient.  The results are noted within this dictation.  I personally reviewed and went over radiographic studies with the patient.  The results are noted within this dictation.  CT CAP on 5/6 demonstrates the following:  1. No evidence of thoracic metastasis.  2. Partial right hemicolectomy without evidence of local recurrence.  3. No evidence metastasis within the abdomen or pelvis.  4. Large ventral hernias  containing nonobstructed loops of small  bowel.  She denies any complaints today.  She is proud to report an 8 lb weight loss with increased exercise and activity.  I congratulated her on this and encouraged her to continue to make healthy living choices.   Oncologically, she denies any complaints and ROS questioning is negative.   Past Medical History  Diagnosis Date  . Diabetes mellitus   . Hypertension   . Enlarged heart   . Goiter   . Nonischemic cardiomyopathy   . Anemia   . 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 Invasive adenocarcinoma of colon 03/08/2013    11/19 LNs POS, THRU TO ADIPOSE TISSUE    has THYROMEGALY; DIABETES MELLITUS, UNCONTROLLED, WITH RENAL COMPLICATIONS; ANXIETY; DEPRESSION; HYPERTENSION; CARDIOMYOPATHY, DILATED; CONGESTIVE HEART FAILURE; SINUSITIS, CHRONIC; ALLERGIC RHINITIS; GERD; ONYCHIA AND PARONYCHIA OF TOE; LOW BACK PAIN; Diabetes mellitus; and Invasive adenocarcinoma of colon on her problem list.     is allergic to actos and ibuprofen.  Ms. Landeck does not currently have medications on file.  Past Surgical History  Procedure Laterality Date  . Wisdom tooth extraction    . Laparotomy  12/13/2012    Procedure: EXPLORATORY LAPAROTOMY;  Surgeon: Alvino Chapel, MD;  Location: [facility];  Service: Gynecology;  Laterality: N/A;  . Abdominal hysterectomy  12/13/2012    Procedure: HYSTERECTOMY ABDOMINAL;  Surgeon: Alvino Chapel, MD;  [facility]: [facility];  Service: Gynecology;  Laterality: N/A;  . Salpingoophorectomy  12/13/2012    Procedure: SALPINGO OOPHORECTOMY;  Surgeon: Alvino Chapel, MD;  [facility]: [facility];  Service: Gynecology;  Laterality: Bilateral;  .  Omentectomy  12/13/2012    Procedure: OMENTECTOMY;  Surgeon: Alvino Chapel, MD;  [facility]: [facility];  Service: Gynecology;  Laterality: N/A;  . Colonoscopy N/A 02/14/2013    Procedure: COLONOSCOPY;  Surgeon: Danie Binder, MD;  [facility];  Service: Endoscopy;   Laterality: N/A;  2:00 PM-moved to [place] notified pt  . Partial colectomy N/A 03/01/2013    Procedure: PARTIAL COLECTOMY;  Surgeon: Jamesetta So, MD;  Location: [facility];  Service: General;  Laterality: N/A;  . Portacath placement Left 03/01/2013    Procedure: INSERTION PORT-A-CATH;  Surgeon: Jamesetta So, MD;  Location: [facility];  Service: General;  Laterality: Left;    Denies any headaches, dizziness, double vision, fevers, chills, night sweats, nausea, vomiting, diarrhea, constipation, chest pain, heart palpitations, shortness of breath, blood in stool, black tarry stool, urinary pain, urinary burning, urinary frequency, hematuria.   PHYSICAL EXAMINATION  ECOG PERFORMANCE STATUS: 0 - Asymptomatic  Filed Vitals:   03/21/14 1247  BP: 171/92  Pulse: 68  Temp: 97 F (36.1 C)  Resp: 18    GENERAL:alert, no distress, well nourished, well developed, comfortable, cooperative, obese and smiling SKIN: skin color, texture, turgor are normal, no rashes or significant lesions HEAD: Normocephalic, No masses, lesions, tenderness or abnormalities EYES: normal, PERRLA, EOMI, Conjunctiva are pink and non-injected EARS: External ears normal OROPHARYNX:mucous membranes are moist  NECK: supple, trachea midline LYMPH:  not examined BREAST:not examined LUNGS: not examined HEART: not examined ABDOMEN:not examined BACK: Back symmetric, no curvature. EXTREMITIES:no skin discoloration  NEURO: alert & oriented x 3 with fluent speech, no focal motor/sensory deficits, gait normal   LABORATORY DATA: CBC    Component Value Date/Time   WBC 6.0 02/28/2014 1122   RBC 4.55 02/28/2014 1122   HGB 13.0 02/28/2014 1122   HCT 38.6 02/28/2014 1122   PLT 138* 02/28/2014 1122   MCV 84.8 02/28/2014 1122   MCH 28.6 02/28/2014 1122   MCHC 33.7 02/28/2014 1122   RDW 14.6 02/28/2014 1122   LYMPHSABS 2.9 02/28/2014 1122   MONOABS 0.5 02/28/2014 1122   EOSABS 0.1 02/28/2014 1122   BASOSABS 0.0 02/28/2014 1122       Chemistry      Component Value Date/Time   NA 141 02/28/2014 1122   K 4.0 02/28/2014 1122   CL 102 02/28/2014 1122   CO2 26 02/28/2014 1122   BUN 17 02/28/2014 1122   CREATININE 0.93 02/28/2014 1122      Component Value Date/Time   CALCIUM 9.4 02/28/2014 1122   ALKPHOS 57 02/28/2014 1122   AST 15 02/28/2014 1122   ALT 11 02/28/2014 1122   BILITOT 0.2* 02/28/2014 1122     Lab Results  Component Value Date   CEA 2.2 02/28/2014      RADIOGRAPHIC STUDIES:  03/14/2014  CLINICAL DATA: Metastatic colorectal cancer. Chemotherapy and  surgery.  EXAM:  CT CHEST, ABDOMEN, AND PELVIS WITH CONTRAST  TECHNIQUE:  Multidetector CT imaging of the chest, abdomen and pelvis was  performed following the standard protocol during bolus  administration of intravenous contrast.  CONTRAST: 182m OMNIPAQUE IOHEXOL 300 MG/ML SOLN  COMPARISON: CT 12/19/2013  FINDINGS:  CT CHEST FINDINGS  No axillary or supraclavicular lymphadenopathy. Stable bilateral  thyroid nodules within an enlarged thyroid gland. No mediastinal  hilar lymphadenopathy. No central pulmonary embolism is evident. No  pericardial fluid. Esophagus is normal.  Review of the lung parenchyma demonstrate no suspicious pulmonary  nodules. Airways are normal.  CT ABDOMEN AND  PELVIS FINDINGS  No focal hepatic lesion. The gallbladder is collapsed. The pancreas,  spleen, adrenal glands, and kidneys are normal.  The stomach and small bowel are normal. There is a partial right  hemicolectomy. No evidence obstruction at the anastomosis. There is  a large ventral hernia with sacs extending left and right of  midline. These hernia sac contains a long segments of nonobstructed  small bowel.  Abdominal or is normal caliber. No retroperitoneal periportal  lymphadenopathy.  No free fluid the pelvis. Post hysterectomy anatomy. No pelvic  lymphadenopathy. No aggressive osseous lesion.  IMPRESSION:  1. No evidence of thoracic metastasis.  2. Partial  right hemicolectomy without evidence of local recurrence.  3. No evidence metastasis within the abdomen or pelvis.  4. Large ventral hernias containing nonobstructed loops of small  bowel.  Electronically Signed  By: Suzy Bouchard M.D.  On: 03/14/2014 13:01    ASSESSMENT:  1. Stage IV colon cancer with liver and intra-abdominal metastases, status post 12 cycles of FOLFOX plus Avastin with resultant peripheral neuropathy. Now on Avastin + Xeloda maintenance.  2. Lower extremity neuropathy on gabapentin, stable.  3. Uncontrolled hypertension due to noncompliance because of financial difficulties.  Patient Active Problem List   Diagnosis Date Noted  . Invasive adenocarcinoma of colon 03/08/2013  . Diabetes mellitus 12/16/2012  . ONYCHIA AND PARONYCHIA OF TOE 05/29/2009  . DIABETES MELLITUS, UNCONTROLLED, WITH RENAL COMPLICATIONS 77/37/3668  . 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 ANXIETY 10/03/2007  . DEPRESSION 10/03/2007  . HYPERTENSION 10/03/2007  . CARDIOMYOPATHY, DILATED 10/03/2007  . CONGESTIVE HEART FAILURE 10/03/2007  . SINUSITIS, CHRONIC 10/03/2007  . ALLERGIC RHINITIS 10/03/2007  . GERD 10/03/2007  . LOW BACK PAIN 10/03/2007     PLAN:  1. I personally reviewed and went over laboratory results with the patient.  The results are noted within this dictation. 2. I personally reviewed and went over radiographic studies with the patient.  The results are noted within this dictation.   3. Pre-chemo labs: CBC diff, CMET, UA  4. Labs every 3 weeks: CEA 5. Continue Avastin every 3 weeks and Xeloda 1000 mg BID 7 days on and 7 days off.  6. Return as scheduled.    THERAPY PLAN:  She will continue maintenance therapy.  All questions were answered. The patient knows to call the clinic with any problems, questions or concerns. We can certainly see the patient much sooner if necessary.  Patient and plan discussed with Dr. Farrel Gobble and he is in agreement with the aforementioned.    [facility] 03/21/2014

## 2014-03-21 ENCOUNTER — Encounter (HOSPITAL_BASED_OUTPATIENT_CLINIC_OR_DEPARTMENT_OTHER): Payer: BC Managed Care – PPO

## 2014-03-21 ENCOUNTER — Encounter (HOSPITAL_COMMUNITY): Payer: BC Managed Care – PPO | Attending: Oncology | Admitting: Oncology

## 2014-03-21 ENCOUNTER — Encounter (HOSPITAL_COMMUNITY): Payer: Self-pay | Admitting: Oncology

## 2014-03-21 VITALS — BP 171/92 | HR 68 | Temp 97.0°F | Resp 18 | Wt 255.0 lb

## 2014-03-21 VITALS — BP 157/77 | HR 66 | Resp 18

## 2014-03-21 DIAGNOSIS — C189 Malignant neoplasm of colon, unspecified: Secondary | ICD-10-CM | POA: Insufficient documentation

## 2014-03-21 DIAGNOSIS — I10 Essential (primary) hypertension: Secondary | ICD-10-CM

## 2014-03-21 DIAGNOSIS — C787 Secondary malignant neoplasm of liver and intrahepatic bile duct: Secondary | ICD-10-CM

## 2014-03-21 DIAGNOSIS — G609 Hereditary and idiopathic neuropathy, unspecified: Secondary | ICD-10-CM

## 2014-03-21 DIAGNOSIS — Z5989 Other problems related to housing and economic circumstances: Secondary | ICD-10-CM

## 2014-03-21 DIAGNOSIS — Z5112 Encounter for antineoplastic immunotherapy: Secondary | ICD-10-CM

## 2014-03-21 DIAGNOSIS — Z5987 Material hardship: Secondary | ICD-10-CM

## 2014-03-21 DIAGNOSIS — Z598 Other problems related to housing and economic circumstances: Secondary | ICD-10-CM

## 2014-03-21 LAB — CBC WITH DIFFERENTIAL/PLATELET
Basophils Absolute: 0 10*3/uL (ref 0.0–0.1)
Basophils Relative: 0 % (ref 0–1)
Eosinophils Absolute: 0.1 10*3/uL (ref 0.0–0.7)
Eosinophils Relative: 1 % (ref 0–5)
HCT: 41.1 % (ref 36.0–46.0)
Hemoglobin: 13.9 g/dL (ref 12.0–15.0)
Lymphocytes Relative: 50 % — ABNORMAL HIGH (ref 12–46)
Lymphs Abs: 3.2 10*3/uL (ref 0.7–4.0)
MCH: 28 pg (ref 26.0–34.0)
MCHC: 33.8 g/dL (ref 30.0–36.0)
MCV: 82.7 fL (ref 78.0–100.0)
MONO ABS: 0.4 10*3/uL (ref 0.1–1.0)
Monocytes Relative: 6 % (ref 3–12)
Neutro Abs: 2.8 10*3/uL (ref 1.7–7.7)
Neutrophils Relative %: 43 % (ref 43–77)
Platelets: 148 10*3/uL — ABNORMAL LOW (ref 150–400)
RBC: 4.97 MIL/uL (ref 3.87–5.11)
RDW: 14.4 % (ref 11.5–15.5)
WBC: 6.5 10*3/uL (ref 4.0–10.5)

## 2014-03-21 LAB — URINALYSIS, DIPSTICK ONLY
Bilirubin Urine: NEGATIVE
Glucose, UA: >1000 mg/dL — AB
Hgb urine dipstick: NEGATIVE
KETONES UR: NEGATIVE mg/dL
Leukocytes, UA: NEGATIVE
NITRITE: NEGATIVE
PROTEIN: 30 mg/dL — AB
Specific Gravity, Urine: 1.025 (ref 1.005–1.030)
Urobilinogen, UA: 0.2 mg/dL (ref 0.0–1.0)
pH: 5.5 (ref 5.0–8.0)

## 2014-03-21 LAB — COMPREHENSIVE METABOLIC PANEL
ALK PHOS: 63 U/L (ref 39–117)
ALT: 15 U/L (ref 0–35)
AST: 17 U/L (ref 0–37)
Albumin: 3.5 g/dL (ref 3.5–5.2)
BILIRUBIN TOTAL: 0.4 mg/dL (ref 0.3–1.2)
BUN: 15 mg/dL (ref 6–23)
CHLORIDE: 99 meq/L (ref 96–112)
CO2: 26 meq/L (ref 19–32)
CREATININE: 0.63 mg/dL (ref 0.50–1.10)
Calcium: 9.5 mg/dL (ref 8.4–10.5)
GFR calc Af Amer: >90 mL/min (ref >90)
Glucose, Bld: 353 mg/dL — ABNORMAL HIGH (ref 70–99)
Potassium: 4.3 mEq/L (ref 3.7–5.3)
SODIUM: 137 meq/L (ref 137–147)
Total Protein: 7.1 g/dL (ref 6.0–8.3)

## 2014-03-21 MED ORDER — SODIUM CHLORIDE 0.9 % IV SOLN
Freq: Once | INTRAVENOUS | Status: AC
  Administered 2014-03-21: 14:00:00 via INTRAVENOUS

## 2014-03-21 MED ORDER — HEPARIN SOD (PORK) LOCK FLUSH 100 UNIT/ML IV SOLN
500.0000 [IU] | Freq: Once | INTRAVENOUS | Status: AC | PRN
  Administered 2014-03-21: 500 [IU]

## 2014-03-21 MED ORDER — SODIUM CHLORIDE 0.9 % IJ SOLN: 10.0000 mL | INTRAMUSCULAR | Status: DC | PRN

## 2014-03-21 MED ORDER — HEPARIN SOD (PORK) LOCK FLUSH 100 UNIT/ML IV SOLN
INTRAVENOUS | Status: AC
  Filled 2014-03-21: qty 5

## 2014-03-21 MED ORDER — SODIUM CHLORIDE 0.9 % IV SOLN
7.5000 mg/kg | Freq: Once | INTRAVENOUS | Status: AC
  Administered 2014-03-21: 800 mg via INTRAVENOUS
  Filled 2014-03-21: qty 32

## 2014-03-21 NOTE — Patient Instructions (Signed)
[facility] Discharge Instructions  RECOMMENDATIONS MADE BY THE CONSULTANT AND ANY TEST RESULTS WILL BE SENT TO YOUR REFERRING PHYSICIAN.  EXAM FINDINGS BY THE PHYSICIAN TODAY AND SIGNS OR SYMPTOMS TO REPORT TO CLINIC OR PRIMARY PHYSICIAN: Exam and findings as discussed by Robynn Pane, PA-C.  Scans were great.  Will continue with therapy.  Report fevers, chills, unexplained weight loss, etc.  MEDICATIONS PRESCRIBED:  none  INSTRUCTIONS/FOLLOW-UP: As Scheduled.  Thank you for choosing [facility] to provide your oncology and hematology care.  To afford each patient quality time with our providers, please arrive at least 15 minutes before your scheduled appointment time.  With your help, our goal is to use those 15 minutes to complete the necessary work-up to ensure our physicians have the information they need to help with your evaluation and healthcare recommendations.    Effective January 1st, 2014, we ask that you re-schedule your appointment with our physicians should you arrive 10 or more minutes late for your appointment.  We strive to give you quality time with our providers, and arriving late affects you and other patients whose appointments are after yours.    Again, thank you for choosing [facility].  Our hope is that these requests will decrease the amount of time that you wait before being seen by our physicians.       _____________________________________________________________  Should you have questions after your visit to [facility]: [name], please contact our office at [phone] between the hours of 8:30 a.m. and 5:00 p.m.  Voicemails left after 4:30 p.m. will not be returned until the following business day.  For prescription refill requests, have your pharmacy contact our office with your prescription refill request.

## 2014-03-22 LAB — CEA: CEA: 2.9 ng/mL (ref 0.0–5.0)

## 2014-04-06 NOTE — Progress Notes (Signed)
Labs drawn

## 2014-04-09 NOTE — Progress Notes (Signed)
[place], [place] [address]  Invasive adenocarcinoma of colon  Hypertension - Plan: cloNIDine (CATAPRES) tablet 0.1 mg  CURRENT THERAPY: Maintenance Xeloda and Avastin since November 2014. Xeloda is 1000 mg BID 7 days on and 7 days off.   INTERVAL HISTORY: Monica Allen 58 y.o. 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 Now on Avastin + Xeloda maintenance.    Invasive adenocarcinoma of colon   01/02/2013 Initial Diagnosis Left ovary and fallopian tube is positive for invasive adenocarcinoma with LVI and positive pelvis biopsy    02/14/2013 Procedure Ascending colon biopsy revealing invasive adenocarcinoma.  KRAS wildtype   03/01/2013 Surgery Left segmental resection showing invasive adenocarcinoma through the muscularis propria into the pericolonoc fat involving the serosa with LVI.  Clear margins.  11/19 positive lymph nodes for disease   04/04/2013 - 09/12/2013 Chemotherapy FOLFOX + Avastin x 12 cycles   09/26/2013 Imaging CT CAP- No definite signs of metastatic disease in the chest, abdomen or pelvis   10/02/2013 -  Chemotherapy Avastin + Xeloda maintenance.  Avstin dose decreased to 7.5 mg/kg on 01/07/2014 due to poorly controlled HTN.   12/21/2013 Imaging CT CAP- Negative for recurrent or metastatic disease in the chest, abdomen or pelvis.    03/14/2014 Imaging CT CAP- No evidence of thoracic metastasis. No evidence metastasis within the abdomen or pelvis.   I personally reviewed and went over laboratory results with the patient. The results are noted within this dictation.  Her BP is noted to be high today, but she admits that she has been out of her BP medication.  She reports that she cannot afford her BP medication from time to time, but she will be able to get it later this week when her husband gets paid.  In the clinic, I will giver her 0.1 mg of Catapres and repeat 15  minutes later until diastolic pressure is below 90.  She is seen in the chemo-recliner eating french fries and a cheeseburger.  Her appetite is good.  She denies any oncology complaints and ROS questioning is negative.   Past Medical History  Diagnosis Date  . Diabetes mellitus   . Hypertension   . Enlarged heart   . Goiter   . Nonischemic cardiomyopathy   . Anemia   . 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 Invasive adenocarcinoma of colon 03/08/2013    11/19 LNs POS, THRU TO ADIPOSE TISSUE    has THYROMEGALY; DIABETES MELLITUS, UNCONTROLLED, WITH RENAL COMPLICATIONS; ANXIETY; DEPRESSION; HYPERTENSION; CARDIOMYOPATHY, DILATED; CONGESTIVE HEART FAILURE; SINUSITIS, CHRONIC; ALLERGIC RHINITIS; GERD; ONYCHIA AND PARONYCHIA OF TOE; LOW BACK PAIN; Diabetes mellitus; and Invasive adenocarcinoma of colon on her problem list.     is allergic to actos and ibuprofen.  Monica Allen had no medications administered during this visit.  Past Surgical History  Procedure Laterality Date  . Wisdom tooth extraction    . Laparotomy  12/13/2012    Procedure: EXPLORATORY LAPAROTOMY;  Surgeon: Alvino Chapel, MD;  [facility]: [facility];  Service: Gynecology;  Laterality: N/A;  . Abdominal hysterectomy  12/13/2012    Procedure: HYSTERECTOMY ABDOMINAL;  Surgeon: Alvino Chapel, MD;  [facility]: [facility];  Service: Gynecology;  Laterality: N/A;  . Salpingoophorectomy  12/13/2012    Procedure: SALPINGO OOPHORECTOMY;  Surgeon: Alvino Chapel, MD;  Location[facility];  Service: Gynecology;  Laterality: Bilateral;  . Omentectomy  12/13/2012  Procedure: OMENTECTOMY;  Surgeon: Alvino Chapel, MD;  [facility];  Service: Gynecology;  Laterality: N/A;  . Colonoscopy N/A 02/14/2013    Procedure: COLONOSCOPY;  Surgeon: Danie Binder, MD;  [facility];  Service: Endoscopy;  Laterality: N/A;  2:00 PM-moved to [place] notified pt  . Partial colectomy N/A 03/01/2013    Procedure:  PARTIAL COLECTOMY;  Surgeon: Jamesetta So, MD;  Location: [facility];  Service: General;  Laterality: N/A;  . Portacath placement Left 03/01/2013    Procedure: INSERTION PORT-A-CATH;  Surgeon: Jamesetta So, MD;  Location: [facility];  Service: General;  Laterality: Left;    Denies any headaches, dizziness, double vision, fevers, chills, night sweats, nausea, vomiting, diarrhea, constipation, chest pain, heart palpitations, shortness of breath, blood in stool, black tarry stool, urinary pain, urinary burning, urinary frequency, hematuria.   PHYSICAL EXAMINATION  ECOG PERFORMANCE STATUS: 0 - Asymptomatic  Filed Vitals:   04/11/14 1100  BP: 175/100  Pulse: 82  Temp: 98.8 F (37.1 C)  Resp: 18    GENERAL:alert, no distress, well nourished, well developed, comfortable, cooperative, obese and smiling SKIN: skin color, texture, turgor are normal, no rashes or significant lesions HEAD: Normocephalic, No masses, lesions, tenderness or abnormalities EYES: normal, PERRLA, EOMI, Conjunctiva are pink and non-injected EARS: External ears normal OROPHARYNX:mucous membranes are moist  NECK: supple, trachea midline LYMPH:  no palpable lymphadenopathy BREAST:not examined LUNGS: clear to auscultation  HEART: regular rate & rhythm, no murmurs and no gallops ABDOMEN:abdomen soft, obese and normal bowel sounds BACK: Back symmetric, no curvature. EXTREMITIES:less then 2 second capillary refill, no joint deformities, effusion, or inflammation, no skin discoloration  NEURO: alert & oriented x 3 with fluent speech, no focal motor/sensory deficits   LABORATORY DATA: CBC    Component Value Date/Time   WBC 6.5 04/11/2014 1149   RBC 5.09 04/11/2014 1149   HGB 14.4 04/11/2014 1149   HCT 43.2 04/11/2014 1149   PLT 140* 04/11/2014 1149   MCV 84.9 04/11/2014 1149   MCH 28.3 04/11/2014 1149   MCHC 33.3 04/11/2014 1149   RDW 14.5 04/11/2014 1149   LYMPHSABS 3.5 04/11/2014 1149   MONOABS 0.4 04/11/2014 1149   EOSABS 0.1  04/11/2014 1149   BASOSABS 0.0 04/11/2014 1149      Chemistry      Component Value Date/Time   NA 139 04/11/2014 1149   K 4.2 04/11/2014 1149   CL 100 04/11/2014 1149   CO2 24 04/11/2014 1149   BUN 13 04/11/2014 1149   CREATININE 0.62 04/11/2014 1149      Component Value Date/Time   CALCIUM 9.4 04/11/2014 1149   ALKPHOS 68 04/11/2014 1149   AST 17 04/11/2014 1149   ALT 17 04/11/2014 1149   BILITOT 0.5 04/11/2014 1149        ASSESSMENT:  1. Stage IV colon cancer with liver and intra-abdominal metastases, status post 12 cycles of FOLFOX plus Avastin with resultant peripheral neuropathy. Now on Avastin + Xeloda maintenance.  2. Lower extremity neuropathy on gabapentin, stable.  3. Uncontrolled hypertension due to noncompliance because of financial difficulties.  Patient Active Problem List   Diagnosis Date Noted  . Invasive adenocarcinoma of colon 03/08/2013  . Diabetes mellitus 12/16/2012  . ONYCHIA AND PARONYCHIA OF TOE 05/29/2009  . DIABETES MELLITUS, UNCONTROLLED, WITH RENAL COMPLICATIONS 92/33/0076  . 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 ANXIETY 10/03/2007  . DEPRESSION 10/03/2007  . HYPERTENSION 10/03/2007  . CARDIOMYOPATHY, DILATED 10/03/2007  . CONGESTIVE HEART FAILURE 10/03/2007  .  SINUSITIS, CHRONIC 10/03/2007  . ALLERGIC RHINITIS 10/03/2007  . GERD 10/03/2007  . LOW BACK PAIN 10/03/2007     PLAN:  1. I personally reviewed and went over laboratory results with the patient. The results are noted within this dictation.  2. I personally reviewed and went over radiographic studies with the patient. The results are noted within this dictation.  3. Pre-chemo labs: CBC diff, CMET, UA  4. Labs every 3 weeks: CEA  5. Continue Avastin every 3 weeks and Xeloda 1000 mg BID 7 days on and 7 days off.  6. Catapres 0.1 mg PO now and may repeat every 15 minutes until diastolic pressure is below 90.  7. Return in 6 weeks for follow-up.    THERAPY PLAN:  She will continue maintenance therapy. Compliance  with medications encouraged.   All questions were answered. The patient knows to call the clinic with any problems, questions or concerns. We can certainly see the patient much sooner if necessary.  Patient and plan discussed with Dr. Farrel Gobble and he is in agreement with the aforementioned.   [facility] 04/11/2014

## 2014-04-11 ENCOUNTER — Encounter (HOSPITAL_COMMUNITY): Payer: BC Managed Care – PPO | Attending: Oncology | Admitting: Oncology

## 2014-04-11 ENCOUNTER — Inpatient Hospital Stay (HOSPITAL_COMMUNITY): Payer: BC Managed Care – PPO

## 2014-04-11 ENCOUNTER — Encounter (HOSPITAL_COMMUNITY): Payer: BC Managed Care – PPO

## 2014-04-11 ENCOUNTER — Encounter (HOSPITAL_BASED_OUTPATIENT_CLINIC_OR_DEPARTMENT_OTHER): Payer: BC Managed Care – PPO

## 2014-04-11 ENCOUNTER — Other Ambulatory Visit (HOSPITAL_COMMUNITY): Payer: Self-pay | Admitting: Hematology and Oncology

## 2014-04-11 ENCOUNTER — Ambulatory Visit (HOSPITAL_COMMUNITY): Payer: BC Managed Care – PPO

## 2014-04-11 ENCOUNTER — Encounter (HOSPITAL_COMMUNITY): Payer: Self-pay | Admitting: Oncology

## 2014-04-11 VITALS — BP 175/100 | HR 82 | Temp 98.8°F | Resp 18

## 2014-04-11 VITALS — BP 157/80 | HR 98 | Resp 16

## 2014-04-11 DIAGNOSIS — C189 Malignant neoplasm of colon, unspecified: Secondary | ICD-10-CM

## 2014-04-11 DIAGNOSIS — C182 Malignant neoplasm of ascending colon: Secondary | ICD-10-CM

## 2014-04-11 DIAGNOSIS — C787 Secondary malignant neoplasm of liver and intrahepatic bile duct: Secondary | ICD-10-CM

## 2014-04-11 DIAGNOSIS — Z5112 Encounter for antineoplastic immunotherapy: Secondary | ICD-10-CM

## 2014-04-11 DIAGNOSIS — I10 Essential (primary) hypertension: Secondary | ICD-10-CM | POA: Insufficient documentation

## 2014-04-11 LAB — COMPREHENSIVE METABOLIC PANEL
ALBUMIN: 3.5 g/dL (ref 3.5–5.2)
ALT: 17 U/L (ref 0–35)
AST: 17 U/L (ref 0–37)
Alkaline Phosphatase: 68 U/L (ref 39–117)
BUN: 13 mg/dL (ref 6–23)
CO2: 24 mEq/L (ref 19–32)
CREATININE: 0.62 mg/dL (ref 0.50–1.10)
Calcium: 9.4 mg/dL (ref 8.4–10.5)
Chloride: 100 mEq/L (ref 96–112)
GFR calc Af Amer: >90 mL/min (ref >90)
GFR calc non Af Amer: >90 mL/min (ref >90)
Glucose, Bld: 293 mg/dL — ABNORMAL HIGH (ref 70–99)
Potassium: 4.2 mEq/L (ref 3.7–5.3)
Sodium: 139 mEq/L (ref 137–147)
TOTAL PROTEIN: 7.3 g/dL (ref 6.0–8.3)
Total Bilirubin: 0.5 mg/dL (ref 0.3–1.2)

## 2014-04-11 LAB — CBC WITH DIFFERENTIAL/PLATELET
BASOS ABS: 0 10*3/uL (ref 0.0–0.1)
BASOS PCT: 0 % (ref 0–1)
Eosinophils Absolute: 0.1 10*3/uL (ref 0.0–0.7)
Eosinophils Relative: 2 % (ref 0–5)
HCT: 43.2 % (ref 36.0–46.0)
Hemoglobin: 14.4 g/dL (ref 12.0–15.0)
Lymphocytes Relative: 53 % — ABNORMAL HIGH (ref 12–46)
Lymphs Abs: 3.5 10*3/uL (ref 0.7–4.0)
MCH: 28.3 pg (ref 26.0–34.0)
MCHC: 33.3 g/dL (ref 30.0–36.0)
MCV: 84.9 fL (ref 78.0–100.0)
MONO ABS: 0.4 10*3/uL (ref 0.1–1.0)
Monocytes Relative: 7 % (ref 3–12)
NEUTROS PCT: 38 % — AB (ref 43–77)
Neutro Abs: 2.4 10*3/uL (ref 1.7–7.7)
Platelets: 140 10*3/uL — ABNORMAL LOW (ref 150–400)
RBC: 5.09 MIL/uL (ref 3.87–5.11)
RDW: 14.5 % (ref 11.5–15.5)
WBC: 6.5 10*3/uL (ref 4.0–10.5)

## 2014-04-11 LAB — URINALYSIS, DIPSTICK ONLY
Bilirubin Urine: NEGATIVE
Glucose, UA: 500 mg/dL — AB
Hgb urine dipstick: NEGATIVE
KETONES UR: NEGATIVE mg/dL
LEUKOCYTES UA: NEGATIVE
NITRITE: NEGATIVE
PROTEIN: 100 mg/dL — AB
Specific Gravity, Urine: >1.03 — ABNORMAL HIGH (ref 1.005–1.030)
Urobilinogen, UA: 0.2 mg/dL (ref 0.0–1.0)
pH: 5.5 (ref 5.0–8.0)

## 2014-04-11 MED ORDER — HEPARIN SOD (PORK) LOCK FLUSH 100 UNIT/ML IV SOLN
INTRAVENOUS | Status: AC
  Filled 2014-04-11: qty 5

## 2014-04-11 MED ORDER — HEPARIN SOD (PORK) LOCK FLUSH 100 UNIT/ML IV SOLN
500.0000 [IU] | Freq: Once | INTRAVENOUS | Status: AC | PRN
  Administered 2014-04-11: 500 [IU]

## 2014-04-11 MED ORDER — CLONIDINE HCL 0.1 MG PO TABS
0.1000 mg | ORAL_TABLET | Freq: Once | ORAL | Status: DC
  Filled 2014-04-11: qty 1

## 2014-04-11 MED ORDER — SODIUM CHLORIDE 0.9 % IV SOLN
5.0000 mg/kg | Freq: Once | INTRAVENOUS | Status: AC
  Administered 2014-04-11: 550 mg via INTRAVENOUS
  Filled 2014-04-11: qty 22

## 2014-04-11 MED ORDER — SODIUM CHLORIDE 0.9 % IV SOLN
Freq: Once | INTRAVENOUS | Status: AC
  Administered 2014-04-11: 500 mL via INTRAVENOUS

## 2014-04-11 MED ORDER — SODIUM CHLORIDE 0.9 % IJ SOLN
10.0000 mL | INTRAMUSCULAR | Status: DC | PRN
  Administered 2014-04-11: 10 mL

## 2014-04-11 NOTE — Progress Notes (Signed)
Monica Allen tolerated infusion well and without incident; verbalizes understanding for follow-up.  Clonidine held per BP improved.  Urine protein 100, and Dr. Barnet Glasgow informed of same; Avastin dose reduced to 550 and patient was treated.  No distress noted at time of discharge and patient was discharged home with her husband.

## 2014-04-11 NOTE — Patient Instructions (Signed)
[facility] Discharge Instructions  RECOMMENDATIONS MADE BY THE CONSULTANT AND ANY TEST RESULTS WILL BE SENT TO YOUR REFERRING PHYSICIAN.  You will see the doctor in 6 weeks. Return in 3 weeks for your chemotherapy treatments. Continue your medications as prescribed. Please get your blood pressure medications.  Thank you for choosing [facility] to provide your oncology and hematology care.  To afford each patient quality time with our providers, please arrive at least 15 minutes before your scheduled appointment time.  With your help, our goal is to use those 15 minutes to complete the necessary work-up to ensure our physicians have the information they need to help with your evaluation and healthcare recommendations.    Effective January 1st, 2014, we ask that you re-schedule your appointment with our physicians should you arrive 10 or more minutes late for your appointment.  We strive to give you quality time with our providers, and arriving late affects you and other patients whose appointments are after yours.    Again, thank you for choosing [facility].  Our hope is that these requests will decrease the amount of time that you wait before being seen by our physicians.       _____________________________________________________________  Should you have questions after your visit to [facility], please contact our office at [phone] between the hours of 8:30 a.m. and 5:00 p.m.  Voicemails left after 4:30 p.m. will not be returned until the following business day.  For prescription refill requests, have your pharmacy contact our office with your prescription refill request.

## 2014-04-11 NOTE — Progress Notes (Unsigned)
Labs drawn for cmp,cea,cbc/diff.

## 2014-04-12 LAB — CEA: CEA: 3.5 ng/mL (ref 0.0–5.0)

## 2014-05-02 ENCOUNTER — Other Ambulatory Visit (HOSPITAL_COMMUNITY): Payer: BC Managed Care – PPO

## 2014-05-02 ENCOUNTER — Encounter (HOSPITAL_BASED_OUTPATIENT_CLINIC_OR_DEPARTMENT_OTHER): Payer: BC Managed Care – PPO

## 2014-05-02 VITALS — BP 160/71 | HR 55 | Temp 98.4°F | Resp 20 | Wt 259.0 lb

## 2014-05-02 DIAGNOSIS — C182 Malignant neoplasm of ascending colon: Secondary | ICD-10-CM

## 2014-05-02 DIAGNOSIS — C787 Secondary malignant neoplasm of liver and intrahepatic bile duct: Secondary | ICD-10-CM

## 2014-05-02 DIAGNOSIS — Z5112 Encounter for antineoplastic immunotherapy: Secondary | ICD-10-CM

## 2014-05-02 DIAGNOSIS — C189 Malignant neoplasm of colon, unspecified: Secondary | ICD-10-CM

## 2014-05-02 LAB — CBC WITH DIFFERENTIAL/PLATELET
Basophils Absolute: 0 10*3/uL (ref 0.0–0.1)
Basophils Relative: 0 % (ref 0–1)
EOS ABS: 0.1 10*3/uL (ref 0.0–0.7)
EOS PCT: 2 % (ref 0–5)
HEMATOCRIT: 40.4 % (ref 36.0–46.0)
HEMOGLOBIN: 13.7 g/dL (ref 12.0–15.0)
LYMPHS ABS: 2.5 10*3/uL (ref 0.7–4.0)
LYMPHS PCT: 44 % (ref 12–46)
MCH: 28.9 pg (ref 26.0–34.0)
MCHC: 33.9 g/dL (ref 30.0–36.0)
MCV: 85.2 fL (ref 78.0–100.0)
MONO ABS: 0.5 10*3/uL (ref 0.1–1.0)
MONOS PCT: 8 % (ref 3–12)
Neutro Abs: 2.6 10*3/uL (ref 1.7–7.7)
Neutrophils Relative %: 46 % (ref 43–77)
Platelets: 137 10*3/uL — ABNORMAL LOW (ref 150–400)
RBC: 4.74 MIL/uL (ref 3.87–5.11)
RDW: 14.6 % (ref 11.5–15.5)
WBC: 5.7 10*3/uL (ref 4.0–10.5)

## 2014-05-02 LAB — COMPREHENSIVE METABOLIC PANEL
ALT: 12 U/L (ref 0–35)
AST: 15 U/L (ref 0–37)
Albumin: 3.2 g/dL — ABNORMAL LOW (ref 3.5–5.2)
Alkaline Phosphatase: 60 U/L (ref 39–117)
BUN: 16 mg/dL (ref 6–23)
CALCIUM: 9 mg/dL (ref 8.4–10.5)
CO2: 27 mEq/L (ref 19–32)
Chloride: 103 mEq/L (ref 96–112)
Creatinine, Ser: 0.66 mg/dL (ref 0.50–1.10)
GFR calc non Af Amer: >90 mL/min (ref >90)
GLUCOSE: 288 mg/dL — AB (ref 70–99)
Potassium: 3.7 mEq/L (ref 3.7–5.3)
Sodium: 143 mEq/L (ref 137–147)
TOTAL PROTEIN: 6.9 g/dL (ref 6.0–8.3)
Total Bilirubin: 0.3 mg/dL (ref 0.3–1.2)

## 2014-05-02 LAB — URINALYSIS, DIPSTICK ONLY
Bilirubin Urine: NEGATIVE
GLUCOSE, UA: 100 mg/dL — AB
HGB URINE DIPSTICK: NEGATIVE
Ketones, ur: NEGATIVE mg/dL
Leukocytes, UA: NEGATIVE
Nitrite: NEGATIVE
SPECIFIC GRAVITY, URINE: 1.02 (ref 1.005–1.030)
Urobilinogen, UA: 0.2 mg/dL (ref 0.0–1.0)
pH: 5 (ref 5.0–8.0)

## 2014-05-02 MED ORDER — BEVACIZUMAB CHEMO INJECTION 400 MG/16ML
5.0000 mg/kg | Freq: Once | INTRAVENOUS | Status: AC
  Administered 2014-05-02: 550 mg via INTRAVENOUS
  Filled 2014-05-02: qty 16

## 2014-05-02 MED ORDER — SODIUM CHLORIDE 0.9 % IV SOLN
INTRAVENOUS | Status: DC
  Administered 2014-05-02: 12:00:00 via INTRAVENOUS

## 2014-05-02 MED ORDER — SODIUM CHLORIDE 0.9 % IJ SOLN
10.0000 mL | INTRAMUSCULAR | Status: DC | PRN
  Administered 2014-05-02: 10 mL via INTRAVENOUS

## 2014-05-02 MED ORDER — HEPARIN SOD (PORK) LOCK FLUSH 100 UNIT/ML IV SOLN
500.0000 [IU] | Freq: Once | INTRAVENOUS | Status: AC
  Administered 2014-05-02: 500 [IU] via INTRAVENOUS
  Filled 2014-05-02: qty 5

## 2014-05-03 LAB — CEA: CEA: 3 ng/mL (ref 0.0–5.0)

## 2014-05-10 ENCOUNTER — Other Ambulatory Visit (HOSPITAL_COMMUNITY): Payer: Self-pay | Admitting: Oncology

## 2014-05-10 DIAGNOSIS — C189 Malignant neoplasm of colon, unspecified: Secondary | ICD-10-CM

## 2014-05-10 DIAGNOSIS — G629 Polyneuropathy, unspecified: Secondary | ICD-10-CM

## 2014-05-10 MED ORDER — CAPECITABINE 500 MG PO TABS: 1000.0000 mg | ORAL_TABLET | Freq: Two times a day (BID) | ORAL | Status: DC

## 2014-05-23 ENCOUNTER — Other Ambulatory Visit (HOSPITAL_COMMUNITY): Payer: BC Managed Care – PPO

## 2014-05-23 ENCOUNTER — Inpatient Hospital Stay (HOSPITAL_COMMUNITY): Payer: BC Managed Care – PPO

## 2014-05-23 ENCOUNTER — Ambulatory Visit (HOSPITAL_COMMUNITY): Payer: BC Managed Care – PPO

## 2014-05-28 ENCOUNTER — Other Ambulatory Visit (HOSPITAL_COMMUNITY): Payer: Self-pay | Admitting: Oncology

## 2014-05-28 DIAGNOSIS — C189 Malignant neoplasm of colon, unspecified: Secondary | ICD-10-CM

## 2014-05-28 MED ORDER — LIDOCAINE-PRILOCAINE 2.5-2.5 % EX CREA: TOPICAL_CREAM | CUTANEOUS | Status: DC

## 2014-05-30 ENCOUNTER — Encounter (HOSPITAL_BASED_OUTPATIENT_CLINIC_OR_DEPARTMENT_OTHER): Payer: BC Managed Care – PPO | Admitting: Oncology

## 2014-05-30 ENCOUNTER — Ambulatory Visit (HOSPITAL_COMMUNITY): Payer: BC Managed Care – PPO

## 2014-05-30 ENCOUNTER — Encounter (HOSPITAL_BASED_OUTPATIENT_CLINIC_OR_DEPARTMENT_OTHER): Payer: BC Managed Care – PPO

## 2014-05-30 ENCOUNTER — Encounter (HOSPITAL_COMMUNITY): Payer: BC Managed Care – PPO | Attending: Oncology

## 2014-05-30 ENCOUNTER — Encounter (HOSPITAL_COMMUNITY): Payer: Self-pay | Admitting: Oncology

## 2014-05-30 ENCOUNTER — Other Ambulatory Visit (HOSPITAL_COMMUNITY): Payer: Self-pay

## 2014-05-30 VITALS — BP 184/67 | HR 59 | Temp 98.3°F | Resp 20 | Wt 263.4 lb

## 2014-05-30 VITALS — BP 143/71 | HR 57

## 2014-05-30 DIAGNOSIS — C189 Malignant neoplasm of colon, unspecified: Secondary | ICD-10-CM

## 2014-05-30 DIAGNOSIS — C182 Malignant neoplasm of ascending colon: Secondary | ICD-10-CM

## 2014-05-30 DIAGNOSIS — I10 Essential (primary) hypertension: Secondary | ICD-10-CM

## 2014-05-30 DIAGNOSIS — C787 Secondary malignant neoplasm of liver and intrahepatic bile duct: Secondary | ICD-10-CM

## 2014-05-30 DIAGNOSIS — E1129 Type 2 diabetes mellitus with other diabetic kidney complication: Secondary | ICD-10-CM

## 2014-05-30 DIAGNOSIS — G609 Hereditary and idiopathic neuropathy, unspecified: Secondary | ICD-10-CM

## 2014-05-30 DIAGNOSIS — Z5112 Encounter for antineoplastic immunotherapy: Secondary | ICD-10-CM

## 2014-05-30 DIAGNOSIS — E1165 Type 2 diabetes mellitus with hyperglycemia: Secondary | ICD-10-CM

## 2014-05-30 LAB — CBC WITH DIFFERENTIAL/PLATELET
BASOS PCT: 0 % (ref 0–1)
Basophils Absolute: 0 10*3/uL (ref 0.0–0.1)
EOS ABS: 0.1 10*3/uL (ref 0.0–0.7)
Eosinophils Relative: 2 % (ref 0–5)
HCT: 41.7 % (ref 36.0–46.0)
Hemoglobin: 13.5 g/dL (ref 12.0–15.0)
Lymphocytes Relative: 49 % — ABNORMAL HIGH (ref 12–46)
Lymphs Abs: 3 10*3/uL (ref 0.7–4.0)
MCH: 28.2 pg (ref 26.0–34.0)
MCHC: 32.4 g/dL (ref 30.0–36.0)
MCV: 87.2 fL (ref 78.0–100.0)
Monocytes Absolute: 0.4 10*3/uL (ref 0.1–1.0)
Monocytes Relative: 6 % (ref 3–12)
NEUTROS ABS: 2.7 10*3/uL (ref 1.7–7.7)
NEUTROS PCT: 43 % (ref 43–77)
Platelets: 155 10*3/uL (ref 150–400)
RBC: 4.78 MIL/uL (ref 3.87–5.11)
RDW: 14.2 % (ref 11.5–15.5)
WBC: 6.2 10*3/uL (ref 4.0–10.5)

## 2014-05-30 LAB — URINALYSIS, DIPSTICK ONLY
BILIRUBIN URINE: NEGATIVE
GLUCOSE, UA: NEGATIVE mg/dL
HGB URINE DIPSTICK: NEGATIVE
Ketones, ur: NEGATIVE mg/dL
LEUKOCYTES UA: NEGATIVE
Nitrite: NEGATIVE
PH: 5.5 (ref 5.0–8.0)
PROTEIN: 100 mg/dL — AB
Specific Gravity, Urine: 1.025 (ref 1.005–1.030)
Urobilinogen, UA: 1 mg/dL (ref 0.0–1.0)

## 2014-05-30 LAB — COMPREHENSIVE METABOLIC PANEL
ALBUMIN: 3.5 g/dL (ref 3.5–5.2)
ALT: 15 U/L (ref 0–35)
AST: 15 U/L (ref 0–37)
Alkaline Phosphatase: 60 U/L (ref 39–117)
Anion gap: 11 (ref 5–15)
BUN: 14 mg/dL (ref 6–23)
CO2: 28 mEq/L (ref 19–32)
Calcium: 9.6 mg/dL (ref 8.4–10.5)
Chloride: 104 mEq/L (ref 96–112)
Creatinine, Ser: 0.69 mg/dL (ref 0.50–1.10)
GFR calc Af Amer: >90 mL/min (ref >90)
GFR calc non Af Amer: >90 mL/min (ref >90)
Glucose, Bld: 182 mg/dL — ABNORMAL HIGH (ref 70–99)
POTASSIUM: 4 meq/L (ref 3.7–5.3)
SODIUM: 143 meq/L (ref 137–147)
TOTAL PROTEIN: 7.1 g/dL (ref 6.0–8.3)
Total Bilirubin: 0.3 mg/dL (ref 0.3–1.2)

## 2014-05-30 LAB — CEA: CEA: 3 ng/mL (ref 0.0–5.0)

## 2014-05-30 MED ORDER — HEPARIN SOD (PORK) LOCK FLUSH 100 UNIT/ML IV SOLN
500.0000 [IU] | Freq: Once | INTRAVENOUS | Status: AC
  Administered 2014-05-30: 500 [IU] via INTRAVENOUS
  Filled 2014-05-30: qty 5

## 2014-05-30 MED ORDER — RAMIPRIL 10 MG PO CAPS: 10.0000 mg | ORAL_CAPSULE | Freq: Every day | ORAL | Status: DC

## 2014-05-30 MED ORDER — BEVACIZUMAB CHEMO INJECTION 400 MG/16ML
5.0000 mg/kg | Freq: Once | INTRAVENOUS | Status: AC
  Administered 2014-05-30: 550 mg via INTRAVENOUS
  Filled 2014-05-30: qty 18

## 2014-05-30 MED ORDER — CLONIDINE HCL 0.1 MG PO TABS
0.1000 mg | ORAL_TABLET | Freq: Once | ORAL | Status: DC
  Filled 2014-05-30: qty 1

## 2014-05-30 MED ORDER — CLONIDINE HCL 0.1 MG PO TABS
0.1000 mg | ORAL_TABLET | Freq: Once | ORAL | Status: AC
  Administered 2014-05-30: 0.1 mg via ORAL
  Filled 2014-05-30: qty 1

## 2014-05-30 MED ORDER — SODIUM CHLORIDE 0.9 % IV SOLN
Freq: Once | INTRAVENOUS | Status: AC
  Administered 2014-05-30: 11:00:00 via INTRAVENOUS

## 2014-05-30 MED ORDER — SODIUM CHLORIDE 0.9 % IJ SOLN
10.0000 mL | Freq: Once | INTRAMUSCULAR | Status: AC
  Administered 2014-05-30: 10 mL via INTRAVENOUS

## 2014-05-30 NOTE — Patient Instructions (Addendum)
[facility] Discharge Instructions  RECOMMENDATIONS MADE BY THE CONSULTANT AND ANY TEST RESULTS WILL BE SENT TO YOUR REFERRING PHYSICIAN.  EXAM FINDINGS BY THE PHYSICIAN TODAY AND SIGNS OR SYMPTOMS TO REPORT TO CLINIC OR PRIMARY PHYSICIAN: Exam and findings as discussed by Robynn Pane, PA-C.  Will refill your Altace and will give you clonidine today.  Will make a referral to Physical therapy based upon your Star Assessment done today.  They will contact you with date and time of appointment. Report fevers, uncontrolled nausea, vomiting or other problems.  MEDICATIONS PRESCRIBED:  Altace - take as directed Xeloda - take as directed   INSTRUCTIONS/FOLLOW-UP: Chemo every 3 weeks and office visit in 6 weeks.  Thank you for choosing [facility] to provide your oncology and hematology care.  To afford each patient quality time with our providers, please arrive at least 15 minutes before your scheduled appointment time.  With your help, our goal is to use those 15 minutes to complete the necessary work-up to ensure our physicians have the information they need to help with your evaluation and healthcare recommendations.    Effective January 1st, 2014, we ask that you re-schedule your appointment with our physicians should you arrive 10 or more minutes late for your appointment.  We strive to give you quality time with our providers, and arriving late affects you and other patients whose appointments are after yours.    Again, thank you for choosing [facility].  Our hope is that these requests will decrease the amount of time that you wait before being seen by our physicians.       _____________________________________________________________  Should you have questions after your visit to [facility], please contact our office at [phone] between the hours of 8:30 a.m. and 4:30 p.m.  Voicemails left after 4:30 p.m. will not be  returned until the following business day.  For prescription refill requests, have your pharmacy contact our office with your prescription refill request.    _______________________________________________________________  We hope that we have given you very good care.  You may receive a patient satisfaction survey in the mail, please complete it and return it as soon as possible.  We value your feedback!  _______________________________________________________________  Have you asked about our STAR program?  STAR stands for Survivorship Training and Rehabilitation, and this is a nationally recognized cancer care program that focuses on survivorship and rehabilitation.  Cancer and cancer treatments may cause problems, such as, pain, making you feel tired and keeping you from doing the things that you need or want to do. Cancer rehabilitation can help. Our goal is to reduce these troubling effects and help you have the best quality of life possible.  You may receive a survey from a nurse that asks questions about your current state of health.  Based on the survey results, all eligible patients will be referred to the [facility] program for an evaluation so we can better serve you!  A frequently asked questions sheet is available upon request.

## 2014-05-30 NOTE — Progress Notes (Signed)
[name], [place] [address]  Invasive adenocarcinoma of colon  HYPERTENSION - Plan: cloNIDine (CATAPRES) tablet 0.1 mg, ramipril (ALTACE) 10 MG capsule  CURRENT THERAPY: Maintenance Xeloda and Avastin since November 2014. Xeloda is 1000 mg BID 7 days on and 7 days off.   INTERVAL HISTORY: Monica Allen 58 y.o. 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 Now on Avastin + Xeloda maintenance.    Invasive adenocarcinoma of colon   01/02/2013 Initial Diagnosis Left ovary and fallopian tube is positive for invasive adenocarcinoma with LVI and positive pelvis biopsy    02/14/2013 Procedure Ascending colon biopsy revealing invasive adenocarcinoma.  KRAS wildtype   03/01/2013 Surgery Left segmental resection showing invasive adenocarcinoma through the muscularis propria into the pericolonoc fat involving the serosa with LVI.  Clear margins.  11/19 positive lymph nodes for disease   04/04/2013 - 09/12/2013 Chemotherapy FOLFOX + Avastin x 12 cycles   09/26/2013 Imaging CT CAP- No definite signs of metastatic disease in the chest, abdomen or pelvis   10/02/2013 -  Chemotherapy Avastin + Xeloda maintenance.  Avstin dose decreased to 7.5 mg/kg on 01/07/2014 due to poorly controlled HTN.   12/21/2013 Imaging CT CAP- Negative for recurrent or metastatic disease in the chest, abdomen or pelvis.    03/14/2014 Imaging CT CAP- No evidence of thoracic metastasis. No evidence metastasis within the abdomen or pelvis.   I personally reviewed and went over laboratory results with the patient.  The results are noted within this dictation.  Her BP is noted to be high today, but she admits that she has been out of her BP medication and she cannot afford to see her primary care provider who refuses to refill her Rx until she is seen.  I will give her Clonidine today PO.  She reports grade II peripheral  neuropathy.  Nurse is evaluating her for the STAR program.   She otherwise denies any oncology complaints and ROS questioning is negative.    Past Medical History  Diagnosis Date  . Diabetes mellitus   . Hypertension   . Enlarged heart   . Goiter   . Nonischemic cardiomyopathy   . Anemia   . 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 Invasive adenocarcinoma of colon 03/08/2013    11/19 LNs POS, THRU TO ADIPOSE TISSUE    has THYROMEGALY; DIABETES MELLITUS, UNCONTROLLED, WITH RENAL COMPLICATIONS; ANXIETY; DEPRESSION; HYPERTENSION; CARDIOMYOPATHY, DILATED; CONGESTIVE HEART FAILURE; SINUSITIS, CHRONIC; ALLERGIC RHINITIS; GERD; ONYCHIA AND PARONYCHIA OF TOE; LOW BACK PAIN; Diabetes mellitus; and Invasive adenocarcinoma of colon on her problem list.     is allergic to actos and ibuprofen.  Monica Allen had no medications administered during this visit.  Past Surgical History  Procedure Laterality Date  . Wisdom tooth extraction    . Laparotomy  12/13/2012    Procedure: EXPLORATORY LAPAROTOMY;  Surgeon: Alvino Chapel, MD;  Location: [facility];  Service: Gynecology;  Laterality: N/A;  . Abdominal hysterectomy  12/13/2012    Procedure: HYSTERECTOMY ABDOMINAL;  Surgeon: Alvino Chapel, MD;  Location: [facility];  Service: Gynecology;  Laterality: N/A;  . Salpingoophorectomy  12/13/2012    Procedure: SALPINGO OOPHORECTOMY;  Surgeon: Alvino Chapel, MD;  Location: [facility];  Service: Gynecology;  Laterality: Bilateral;  . Omentectomy  12/13/2012    Procedure: OMENTECTOMY;  Surgeon: Alvino Chapel, MD;  Location: [facility];  Service: Gynecology;  Laterality: N/A;  . Colonoscopy N/A 02/14/2013    Procedure: COLONOSCOPY;  Surgeon: Danie Binder, MD;  Location: [facility];  Service: Endoscopy;  Laterality: N/A;  2:00 PM-moved to [place] notified pt  . Partial colectomy N/A 03/01/2013    Procedure: PARTIAL COLECTOMY;  Surgeon: Jamesetta So, MD;  Location: [facility];  Service:  General;  Laterality: N/A;  . Portacath placement Left 03/01/2013    Procedure: INSERTION PORT-A-CATH;  Surgeon: Jamesetta So, MD;  Location: [facility];  Service: General;  Laterality: Left;    Denies any headaches, dizziness, double vision, fevers, chills, night sweats, nausea, vomiting, diarrhea, constipation, chest pain, heart palpitations, shortness of breath, blood in stool, black tarry stool, urinary pain, urinary burning, urinary frequency, hematuria.   PHYSICAL EXAMINATION  ECOG PERFORMANCE STATUS: 1 - Symptomatic but completely ambulatory  Filed Vitals:   05/30/14 0827  BP: 184/67  Pulse: 59  Temp: 98.3 F (36.8 C)  Resp: 20    GENERAL:alert, no distress, well nourished, well developed, comfortable, cooperative, obese and smiling SKIN: skin color, texture, turgor are normal, no rashes or significant lesions HEAD: Normocephalic, No masses, lesions, tenderness or abnormalities EYES: normal, PERRLA, EOMI, Conjunctiva are pink and non-injected EARS: External ears normal OROPHARYNX:mucous membranes are moist  NECK: supple, no adenopathy, thyroid normal size, non-tender, without nodularity, no stridor, non-tender, trachea midline LYMPH:  no palpable lymphadenopathy, no hepatosplenomegaly BREAST:not examined LUNGS: clear to auscultation and percussion HEART: regular rate & rhythm, no murmurs and no gallops ABDOMEN:abdomen soft, non-tender, obese, normal bowel sounds and no masses or organomegaly BACK: Back symmetric, no curvature. EXTREMITIES:less then 2 second capillary refill, no joint deformities, effusion, or inflammation, no skin discoloration, no clubbing, no cyanosis  NEURO: alert & oriented x 3 with fluent speech, no focal motor/sensory deficits, gait normal    LABORATORY DATA: CBC    Component Value Date/Time   WBC 6.2 05/30/2014 0845   RBC 4.78 05/30/2014 0845   HGB 13.5 05/30/2014 0845   HCT 41.7 05/30/2014 0845   PLT 155 05/30/2014 0845   MCV 87.2 05/30/2014 0845     MCH 28.2 05/30/2014 0845   MCHC 32.4 05/30/2014 0845   RDW 14.2 05/30/2014 0845   LYMPHSABS 3.0 05/30/2014 0845   MONOABS 0.4 05/30/2014 0845   EOSABS 0.1 05/30/2014 0845   BASOSABS 0.0 05/30/2014 0845      Chemistry      Component Value Date/Time   NA 143 05/02/2014 1107   K 3.7 05/02/2014 1107   CL 103 05/02/2014 1107   CO2 27 05/02/2014 1107   BUN 16 05/02/2014 1107   CREATININE 0.66 05/02/2014 1107      Component Value Date/Time   CALCIUM 9.0 05/02/2014 1107   ALKPHOS 60 05/02/2014 1107   AST 15 05/02/2014 1107   ALT 12 05/02/2014 1107   BILITOT 0.3 05/02/2014 1107        ASSESSMENT:  1. Stage IV colon cancer with liver and intra-abdominal metastases, status post 12 cycles of FOLFOX plus Avastin with resultant peripheral neuropathy. Now on Avastin + Xeloda maintenance.  2. Lower extremity neuropathy on gabapentin, stable.  3. Uncontrolled hypertension due to noncompliance because of financial difficulties. 4. Grade II peripheral neuropathy.  Patient Active Problem List   Diagnosis Date Noted  . Invasive adenocarcinoma of colon 03/08/2013  . Diabetes mellitus 12/16/2012  . ONYCHIA AND PARONYCHIA OF TOE 05/29/2009  . DIABETES MELLITUS, UNCONTROLLED, WITH RENAL COMPLICATIONS 84/13/2440  . 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 ANXIETY 10/03/2007  .  DEPRESSION 10/03/2007  . HYPERTENSION 10/03/2007  . CARDIOMYOPATHY, DILATED 10/03/2007  . CONGESTIVE HEART FAILURE 10/03/2007  . SINUSITIS, CHRONIC 10/03/2007  . ALLERGIC RHINITIS 10/03/2007  . GERD 10/03/2007  . LOW BACK PAIN 10/03/2007    PLAN:  1. I personally reviewed and went over laboratory results with the patient.  The results are noted within this dictation. 2. Pre-chemo labs: CBC diff, CMET, UA  3. Labs every 3 weeks: CEA  4. Continue Avastin every 3 weeks and Xeloda 1000 mg BID 7 days on and 7 days off.  5. Clonidine 0.1 mg tablet today in the clinic. 6. Refill Altace 10 mg tabs in 3 refills. 7. Evaluate for STAR program 8.  Return in 6 weeks for follow-up.   THERAPY PLAN:  She will continue maintenance therapy. Compliance with medications encouraged.   All questions were answered. The patient knows to call the clinic with any problems, questions or concerns. We can certainly see the patient much sooner if necessary.  Patient and plan discussed with Dr. Nelida Meuse and he is in agreement with the aforementioned.   Pacey Willadsen 05/30/2014

## 2014-05-30 NOTE — Progress Notes (Signed)
Tolerated well

## 2014-05-30 NOTE — Progress Notes (Signed)
LABS DRAWN FOR CBCD,CMP,CEA,URINE DIP.

## 2014-05-30 NOTE — Progress Notes (Signed)
STAR Program Physical Impairment and Functional Assessment Screening Tool  1. Are you having any pain, including headaches, joint pain, or muscle pain (upper body = OT; lower body = PT)?   [    ]  No   [    ]  Yes, but I hand this before my cancer diagnosis.   Monica Allen    ]  Yes, this started after my diagnosis and is still a problem.   [    ]  Yes, this is new since my last visit. 2. Do your hands and/or feet feel numb or tingle (PT)?   [    ]  No   [    ]  Yes, but I hand this before my cancer diagnosis.   Monica Allen   ]  Yes, this started after my diagnosis and is still a problem.   [    ]  Yes, this is new since my last visit. 3. Does any part of your body feel swollen or larger than usual (upper body = OT; lower body = PT)?   Monica Allen   ]  No   [    ]  Yes, but I hand this before my cancer diagnosis.   [    ]  Yes, this started after my diagnosis and is still a problem.   [    ]  Yes, this is new since my last visit. 4. Are you so tired that you cannot do the things you want or need to do (PT or OT)?   [ X   ]  No   [    ]  Yes, but I hand this before my cancer diagnosis.   [    ]  Yes, this started after my diagnosis and is still a problem.   [    ]  Yes, this is new since my last visit. 5. Are you feeling weak or are you having trouble moving any part of your body (PT/OT)?   Monica Allen    ]  No   [    ]  Yes, but I hand this before my cancer diagnosis.   [    ]  Yes, this started after my diagnosis and is still a problem.   [    ]  Yes, this is new since my last visit. 6. Are you having trouble concentrating, thinking, or remembering things (OT/ST)?   [    ]  No   [ X  ]  Yes, but I hand this before my cancer diagnosis.   [    ]  Yes, this started after my diagnosis and is still a problem.   [    ]  Yes, this is new since my last visit. 7. Are you having trouble moving around or feel like you might trip or fall (PT)?   [    ]  No   [    ]  Yes, but I hand this before my cancer diagnosis.   Monica Allen    ]   Yes, this started after my diagnosis and is still a problem.   [    ]  Yes, this is new since my last visit. 8. Are you having trouble swallowing (ST)?   Monica Allen    ]  No   [    ]  Yes, but I hand this before my cancer diagnosis.   [    ]  Yes, this started after my diagnosis and  is still a problem.   [    ]  Yes, this is new since my last visit. 9. Are you having trouble speaking (ST)?   [ X  ]  No   [    ]  Yes, but I hand this before my cancer diagnosis.   [    ]  Yes, this started after my diagnosis and is still a problem.   [    ]  Yes, this is new since my last visit. 10. Are you having trouble with going or getting to the bathroom (OT)?   [ X  ]  No   [    ]  Yes, but I hand this before my cancer diagnosis.   [    ]  Yes, this started after my diagnosis and is still a problem.   [    ]  Yes, this is new since my last visit. 11. Are you having trouble with your sexual function (OT)?   Monica Allen   ]  No   [    ]  Yes, but I hand this before my cancer diagnosis.   [    ]  Yes, this started after my diagnosis and is still a problem.   [    ]  Yes, this is new since my last visit. 12. Are you having trouble lifting things, even just your arms (OT/PT)?   [    ]  No   [    ]  Yes, but I hand this before my cancer diagnosis.   Monica Allen   ]  Yes, this started after my diagnosis and is still a problem.   [    ]  Yes, this is new since my last visit. 34. Are you having trouble taking care of yourself as in dressing or bathing (OT)?   Monica Allen    ]  No   [    ]  Yes, but I hand this before my cancer diagnosis.   [    ]  Yes, this started after my diagnosis and is still a problem.   [    ]  Yes, this is new since my last visit. 14. Are you having trouble with daily tasks like chores or shopping (OT)?   [ X ]  No   [    ]  Yes, but I hand this before my cancer diagnosis.   [    ]  Yes, this started after my diagnosis and is still a problem.   [    ]  Yes, this is new since my last visit. 15. Are you having  trouble driving (OT)?   [ X   ]  No   [    ]  Yes, but I hand this before my cancer diagnosis.   [    ]  Yes, this started after my diagnosis and is still a problem.   [    ]  Yes, this is new since my last visit. 66. Are you having trouble returning to work or completing your tasks at work (OT)?   [ X  ]  No   [    ]  Yes, but I hand this before my cancer diagnosis.   [    ]  Yes, this started after my diagnosis and is still a problem.   [    ]  Yes, this is new since my last visit.  Other concerns:  Legend: OT = Occupational Therapy PT = Physical Therapy ST = Speech Therapy

## 2014-06-06 ENCOUNTER — Other Ambulatory Visit (HOSPITAL_COMMUNITY): Payer: Self-pay | Admitting: Oncology

## 2014-06-06 ENCOUNTER — Telehealth (HOSPITAL_COMMUNITY): Payer: Self-pay | Admitting: *Deleted

## 2014-06-06 ENCOUNTER — Telehealth (HOSPITAL_COMMUNITY): Payer: Self-pay

## 2014-06-06 DIAGNOSIS — J069 Acute upper respiratory infection, unspecified: Secondary | ICD-10-CM

## 2014-06-06 MED ORDER — AMOXICILLIN-POT CLAVULANATE 875-125 MG PO TABS: 1.0000 | ORAL_TABLET | Freq: Two times a day (BID) | ORAL | Status: DC

## 2014-06-06 NOTE — Telephone Encounter (Signed)
Augmentin BID x 10 days escribed

## 2014-06-06 NOTE — Telephone Encounter (Signed)
Baird [facility], PA-C at 06/06/2014 1:24 PM     Status: Signed        Augmentin BID x 10 days escribed   Patient notified.

## 2014-06-12 ENCOUNTER — Other Ambulatory Visit (HOSPITAL_COMMUNITY): Payer: Self-pay | Admitting: Oncology

## 2014-06-12 DIAGNOSIS — G629 Polyneuropathy, unspecified: Secondary | ICD-10-CM

## 2014-06-12 MED ORDER — GABAPENTIN 300 MG PO CAPS: ORAL_CAPSULE | ORAL | Status: DC

## 2014-06-20 ENCOUNTER — Encounter (HOSPITAL_COMMUNITY): Payer: BC Managed Care – PPO | Attending: Oncology

## 2014-06-20 ENCOUNTER — Encounter (HOSPITAL_BASED_OUTPATIENT_CLINIC_OR_DEPARTMENT_OTHER): Payer: BC Managed Care – PPO

## 2014-06-20 VITALS — BP 163/73 | HR 59 | Temp 97.8°F | Resp 18 | Wt 268.4 lb

## 2014-06-20 DIAGNOSIS — C189 Malignant neoplasm of colon, unspecified: Secondary | ICD-10-CM | POA: Insufficient documentation

## 2014-06-20 DIAGNOSIS — C787 Secondary malignant neoplasm of liver and intrahepatic bile duct: Secondary | ICD-10-CM

## 2014-06-20 DIAGNOSIS — Z5112 Encounter for antineoplastic immunotherapy: Secondary | ICD-10-CM

## 2014-06-20 LAB — CBC WITH DIFFERENTIAL/PLATELET
BASOS ABS: 0 10*3/uL (ref 0.0–0.1)
BASOS PCT: 0 % (ref 0–1)
EOS PCT: 4 % (ref 0–5)
Eosinophils Absolute: 0.2 10*3/uL (ref 0.0–0.7)
HCT: 40.8 % (ref 36.0–46.0)
Hemoglobin: 13.6 g/dL (ref 12.0–15.0)
Lymphocytes Relative: 46 % (ref 12–46)
Lymphs Abs: 2.7 10*3/uL (ref 0.7–4.0)
MCH: 29.1 pg (ref 26.0–34.0)
MCHC: 33.3 g/dL (ref 30.0–36.0)
MCV: 87.2 fL (ref 78.0–100.0)
Monocytes Absolute: 0.3 10*3/uL (ref 0.1–1.0)
Monocytes Relative: 6 % (ref 3–12)
Neutro Abs: 2.6 10*3/uL (ref 1.7–7.7)
Neutrophils Relative %: 44 % (ref 43–77)
PLATELETS: 160 10*3/uL (ref 150–400)
RBC: 4.68 MIL/uL (ref 3.87–5.11)
RDW: 14.1 % (ref 11.5–15.5)
WBC: 5.9 10*3/uL (ref 4.0–10.5)

## 2014-06-20 LAB — COMPREHENSIVE METABOLIC PANEL
ALBUMIN: 3.2 g/dL — AB (ref 3.5–5.2)
ALT: 9 U/L (ref 0–35)
AST: 13 U/L (ref 0–37)
Alkaline Phosphatase: 50 U/L (ref 39–117)
Anion gap: 9 (ref 5–15)
BUN: 11 mg/dL (ref 6–23)
CALCIUM: 9.1 mg/dL (ref 8.4–10.5)
CO2: 29 mEq/L (ref 19–32)
CREATININE: 0.64 mg/dL (ref 0.50–1.10)
Chloride: 104 mEq/L (ref 96–112)
GFR calc Af Amer: >90 mL/min (ref >90)
Glucose, Bld: 179 mg/dL — ABNORMAL HIGH (ref 70–99)
Potassium: 4.8 mEq/L (ref 3.7–5.3)
Sodium: 142 mEq/L (ref 137–147)
TOTAL PROTEIN: 6.8 g/dL (ref 6.0–8.3)
Total Bilirubin: 0.2 mg/dL — ABNORMAL LOW (ref 0.3–1.2)

## 2014-06-20 LAB — URINALYSIS, DIPSTICK ONLY
Bilirubin Urine: NEGATIVE
GLUCOSE, UA: NEGATIVE mg/dL
Hgb urine dipstick: NEGATIVE
KETONES UR: NEGATIVE mg/dL
LEUKOCYTES UA: NEGATIVE
Nitrite: NEGATIVE
PROTEIN: 100 mg/dL — AB
Specific Gravity, Urine: 1.025 (ref 1.005–1.030)
UROBILINOGEN UA: 0.2 mg/dL (ref 0.0–1.0)
pH: 6 (ref 5.0–8.0)

## 2014-06-20 MED ORDER — SODIUM CHLORIDE 0.9 % IV SOLN
5.0000 mg/kg | Freq: Once | INTRAVENOUS | Status: AC
  Administered 2014-06-20: 550 mg via INTRAVENOUS
  Filled 2014-06-20: qty 22

## 2014-06-20 MED ORDER — SODIUM CHLORIDE 0.9 % IV SOLN
INTRAVENOUS | Status: DC
  Administered 2014-06-20: 11:00:00 via INTRAVENOUS

## 2014-06-20 MED ORDER — SODIUM CHLORIDE 0.9 % IJ SOLN
10.0000 mL | Freq: Once | INTRAMUSCULAR | Status: AC
  Administered 2014-06-20: 10 mL via INTRAVENOUS

## 2014-06-20 MED ORDER — HEPARIN SOD (PORK) LOCK FLUSH 100 UNIT/ML IV SOLN
500.0000 [IU] | Freq: Once | INTRAVENOUS | Status: AC
  Administered 2014-06-20: 500 [IU] via INTRAVENOUS
  Filled 2014-06-20: qty 5

## 2014-06-20 NOTE — Patient Instructions (Signed)
[facility] Discharge Instructions for Patients Receiving Chemotherapy  Today you received the following chemotherapy agents Avastin. To help prevent nausea and vomiting after your treatment, we encourage you to take your nausea medication as instructed. Return to clinic as scheduled. If you develop nausea and vomiting that is not controlled by your nausea medication, call the clinic. If it is after clinic hours your family physician or the after hours number for the clinic or go to the Emergency Department.   BELOW ARE SYMPTOMS THAT SHOULD BE REPORTED IMMEDIATELY:  *FEVER GREATER THAN 101.0 F  *CHILLS WITH OR WITHOUT FEVER  NAUSEA AND VOMITING THAT IS NOT CONTROLLED WITH YOUR NAUSEA MEDICATION  *UNUSUAL SHORTNESS OF BREATH  *UNUSUAL BRUISING OR BLEEDING  TENDERNESS IN MOUTH AND THROAT WITH OR WITHOUT PRESENCE OF ULCERS  *URINARY PROBLEMS  *BOWEL PROBLEMS  UNUSUAL RASH Items with * indicate a potential emergency and should be followed up as soon as possible.  One of the nurses will contact you 24 hours after your treatment. Please let the nurse know about any problems that you may have experienced. Feel free to call the clinic you have any questions or concerns. The clinic phone number is [phone].   I have been informed and understand all the instructions given to me. I know to contact the clinic, my physician, or go to the Emergency Department if any problems should occur. I do not have any questions at this time, but understand that I may call the clinic during office hours or the Patient Navigator at [phone] should I have any questions or need assistance in obtaining follow up care.    __________________________________________  _____________  __________ Signature of Patient or Authorized Representative            Date                   Time    __________________________________________ Nurse's Signature

## 2014-06-20 NOTE — Progress Notes (Signed)
Labs done for udip,cbcd,cmp,cea,

## 2014-06-21 ENCOUNTER — Ambulatory Visit (HOSPITAL_COMMUNITY)
Admission: RE | Admit: 2014-06-21 | Payer: BC Managed Care – PPO | Source: Ambulatory Visit | Admitting: Physical Therapy

## 2014-06-21 LAB — CEA: CEA: 2.6 ng/mL (ref 0.0–5.0)

## 2014-07-11 ENCOUNTER — Encounter (HOSPITAL_BASED_OUTPATIENT_CLINIC_OR_DEPARTMENT_OTHER): Payer: BC Managed Care – PPO

## 2014-07-11 ENCOUNTER — Ambulatory Visit (HOSPITAL_COMMUNITY): Payer: BC Managed Care – PPO | Admitting: Oncology

## 2014-07-11 ENCOUNTER — Encounter (HOSPITAL_COMMUNITY): Payer: BC Managed Care – PPO | Attending: Oncology

## 2014-07-11 VITALS — BP 156/79 | HR 70 | Temp 97.5°F | Resp 20 | Wt 266.0 lb

## 2014-07-11 DIAGNOSIS — C182 Malignant neoplasm of ascending colon: Secondary | ICD-10-CM

## 2014-07-11 DIAGNOSIS — C189 Malignant neoplasm of colon, unspecified: Secondary | ICD-10-CM | POA: Diagnosis present

## 2014-07-11 DIAGNOSIS — G629 Polyneuropathy, unspecified: Secondary | ICD-10-CM

## 2014-07-11 DIAGNOSIS — C787 Secondary malignant neoplasm of liver and intrahepatic bile duct: Secondary | ICD-10-CM

## 2014-07-11 DIAGNOSIS — C779 Secondary and unspecified malignant neoplasm of lymph node, unspecified: Secondary | ICD-10-CM

## 2014-07-11 DIAGNOSIS — G609 Hereditary and idiopathic neuropathy, unspecified: Secondary | ICD-10-CM

## 2014-07-11 DIAGNOSIS — C50919 Malignant neoplasm of unspecified site of unspecified female breast: Secondary | ICD-10-CM

## 2014-07-11 DIAGNOSIS — Z5112 Encounter for antineoplastic immunotherapy: Secondary | ICD-10-CM

## 2014-07-11 LAB — CBC WITH DIFFERENTIAL/PLATELET
BASOS ABS: 0 10*3/uL (ref 0.0–0.1)
BASOS PCT: 0 % (ref 0–1)
Eosinophils Absolute: 0.2 10*3/uL (ref 0.0–0.7)
Eosinophils Relative: 3 % (ref 0–5)
HCT: 43 % (ref 36.0–46.0)
Hemoglobin: 14.3 g/dL (ref 12.0–15.0)
Lymphocytes Relative: 44 % (ref 12–46)
Lymphs Abs: 3 10*3/uL (ref 0.7–4.0)
MCH: 28.4 pg (ref 26.0–34.0)
MCHC: 33.3 g/dL (ref 30.0–36.0)
MCV: 85.5 fL (ref 78.0–100.0)
Monocytes Absolute: 0.4 10*3/uL (ref 0.1–1.0)
Monocytes Relative: 7 % (ref 3–12)
NEUTROS ABS: 3.2 10*3/uL (ref 1.7–7.7)
Neutrophils Relative %: 46 % (ref 43–77)
PLATELETS: 149 10*3/uL — AB (ref 150–400)
RBC: 5.03 MIL/uL (ref 3.87–5.11)
RDW: 14 % (ref 11.5–15.5)
WBC: 6.8 10*3/uL (ref 4.0–10.5)

## 2014-07-11 LAB — COMPREHENSIVE METABOLIC PANEL
ALT: 12 U/L (ref 0–35)
ANION GAP: 15 (ref 5–15)
AST: 19 U/L (ref 0–37)
Albumin: 3.6 g/dL (ref 3.5–5.2)
Alkaline Phosphatase: 59 U/L (ref 39–117)
BUN: 15 mg/dL (ref 6–23)
CO2: 25 mEq/L (ref 19–32)
Calcium: 9.6 mg/dL (ref 8.4–10.5)
Chloride: 101 mEq/L (ref 96–112)
Creatinine, Ser: 0.75 mg/dL (ref 0.50–1.10)
Glucose, Bld: 229 mg/dL — ABNORMAL HIGH (ref 70–99)
POTASSIUM: 4.6 meq/L (ref 3.7–5.3)
SODIUM: 141 meq/L (ref 137–147)
TOTAL PROTEIN: 7.4 g/dL (ref 6.0–8.3)
Total Bilirubin: 0.3 mg/dL (ref 0.3–1.2)

## 2014-07-11 LAB — URINALYSIS, DIPSTICK ONLY
Bilirubin Urine: NEGATIVE
Glucose, UA: NEGATIVE mg/dL
HGB URINE DIPSTICK: NEGATIVE
Ketones, ur: NEGATIVE mg/dL
Leukocytes, UA: NEGATIVE
Nitrite: NEGATIVE
PH: 5.5 (ref 5.0–8.0)
Protein, ur: 100 mg/dL — AB
SPECIFIC GRAVITY, URINE: 1.025 (ref 1.005–1.030)
UROBILINOGEN UA: 0.2 mg/dL (ref 0.0–1.0)

## 2014-07-11 LAB — CEA: CEA: 2.2 ng/mL (ref 0.0–5.0)

## 2014-07-11 MED ORDER — SODIUM CHLORIDE 0.9 % IJ SOLN
10.0000 mL | Freq: Once | INTRAMUSCULAR | Status: AC
  Administered 2014-07-11: 10 mL via INTRAVENOUS

## 2014-07-11 MED ORDER — FLUTICASONE PROPIONATE 50 MCG/ACT NA SUSP: NASAL | Status: AC

## 2014-07-11 MED ORDER — HEPARIN SOD (PORK) LOCK FLUSH 100 UNIT/ML IV SOLN
500.0000 [IU] | Freq: Once | INTRAVENOUS | Status: AC
  Administered 2014-07-11: 500 [IU] via INTRAVENOUS
  Filled 2014-07-11: qty 5

## 2014-07-11 MED ORDER — SODIUM CHLORIDE 0.9 % IV SOLN
INTRAVENOUS | Status: DC
  Administered 2014-07-11: 09:00:00 via INTRAVENOUS

## 2014-07-11 MED ORDER — SODIUM CHLORIDE 0.9 % IV SOLN
5.0000 mg/kg | Freq: Once | INTRAVENOUS | Status: AC
  Administered 2014-07-11: 550 mg via INTRAVENOUS
  Filled 2014-07-11: qty 22

## 2014-07-11 NOTE — Progress Notes (Signed)
Labs for cea,cmp,cbcd,udip

## 2014-07-11 NOTE — Progress Notes (Signed)
Tolerated well

## 2014-07-11 NOTE — Patient Instructions (Signed)
[facility] Discharge Instructions for Patients Receiving Chemotherapy  Today you received the following chemotherapy agents Avastin. We will continue Avastin every 3 weeks as scheduled. CT scans in 2 weeks. A prescription for Flonase was sent to your pharmacy. Take as directed. Report any issues/concerns to clinic as needed.  If you develop nausea and vomiting that is not controlled by your nausea medication, call the clinic. If it is after clinic hours your family physician or the after hours number for the clinic or go to the Emergency Department.   BELOW ARE SYMPTOMS THAT SHOULD BE REPORTED IMMEDIATELY:  *FEVER GREATER THAN 101.0 F  *CHILLS WITH OR WITHOUT FEVER  NAUSEA AND VOMITING THAT IS NOT CONTROLLED WITH YOUR NAUSEA MEDICATION  *UNUSUAL SHORTNESS OF BREATH  *UNUSUAL BRUISING OR BLEEDING  TENDERNESS IN MOUTH AND THROAT WITH OR WITHOUT PRESENCE OF ULCERS  *URINARY PROBLEMS  *BOWEL PROBLEMS  UNUSUAL RASH Items with * indicate a potential emergency and should be followed up as soon as possible.  I have been informed and understand all the instructions given to me. I know to contact the clinic, my physician, or go to the Emergency Department if any problems should occur. I do not have any questions at this time, but understand that I may call the clinic during office hours or the Patient Navigator at [phone] should I have any questions or need assistance in obtaining follow up care.    __________________________________________  _____________  __________ Signature of Patient or Authorized Representative            Date                   Time    __________________________________________ Nurse's Signature

## 2014-07-11 NOTE — Progress Notes (Signed)
Monica Allen  OFFICE PROGRESS NOTE  Monica Allen, [place] [address]  DIAGNOSIS: Carcinoma of colon metastatic to multiple sites - Plan: CT Abdomen Pelvis W Contrast, CT Chest W Contrast  Peripheral neuropathy  Chief Complaint  Patient presents with  . Stage IV colon cancer  . Hypertension    CURRENT THERAPY: Maintenance Xeloda/Avastin since November 2014 with Xeloda given at 1000 mg twice a day for 7 days on and 7 days off and Avastin given every 3 weeks at 5 mg per kilogram.  INTERVAL HISTORY: Monica Allen 58 y.o. female returns for followup of Stage IV colon cancer with liver and intra-abdominal metastases. Now on Avastin + Xeloda maintenance. She still has problems with runny nose and scratchy throat without fever or chills. She denies any earache, headache, fever, night sweats, diarrhea, constipation, dysuria, hematuria, incontinence, abdominal distention, lower extremity swelling or redness, PND, orthopnea, palpitations, worsening paresthesias, or seizures.    MEDICAL HISTORY: Past Medical History  Diagnosis Date  . Diabetes mellitus   . Hypertension   . Enlarged heart   . Goiter   . Nonischemic cardiomyopathy   . Anemia   . 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 Invasive adenocarcinoma of colon 03/08/2013    11/19 LNs POS, THRU TO ADIPOSE TISSUE    INTERIM HISTORY: has THYROMEGALY; DIABETES MELLITUS, UNCONTROLLED, WITH RENAL COMPLICATIONS; ANXIETY; DEPRESSION; HYPERTENSION; CARDIOMYOPATHY, DILATED; CONGESTIVE HEART FAILURE; SINUSITIS, CHRONIC; ALLERGIC RHINITIS; GERD; ONYCHIA AND PARONYCHIA OF TOE; LOW BACK PAIN; Diabetes mellitus; and Invasive adenocarcinoma of colon on her problem list.   Invasive adenocarcinoma of colon    01/02/2013  Initial Diagnosis  Left ovary and fallopian tube is positive for invasive adenocarcinoma with LVI and positive pelvis biopsy    02/14/2013  Procedure  Ascending  colon biopsy revealing invasive adenocarcinoma. KRAS wildtype    03/01/2013  Surgery  Left segmental resection showing invasive adenocarcinoma through the muscularis propria into the pericolonoc fat involving the serosa with LVI. Clear margins. 11/19 positive lymph nodes for disease    04/04/2013 - 09/12/2013  Chemotherapy  FOLFOX + Avastin x 12 cycles    09/26/2013  Imaging  CT CAP- No definite signs of metastatic disease in the chest, abdomen or pelvis    10/02/2013 -  Chemotherapy  Avastin + Xeloda maintenance. Avstin dose decreased to 7.5 mg/kg on 01/07/2014 due to poorly controlled HTN.    12/21/2013  Imaging  CT CAP- Negative for recurrent or metastatic disease in the chest, abdomen or pelvis.    03/14/2014  Imaging  CT CAP- No evidence of thoracic metastasis. No evidence metastasis within the abdomen or pelvis.      ALLERGIES:  is allergic to actos and ibuprofen.  MEDICATIONS: has a current medication list which includes the following prescription(s): aspirin ec, capecitabine, carvedilol, fluticasone, furosemide, gabapentin, glipizide, hydrocodone-acetaminophen, lidocaine-prilocaine, linagliptin-metformin hcl, potassium chloride sa, ramipril, and valsartan, and the following Facility-Administered Medications: sodium chloride, bevacizumab (AVASTIN) 550 mg in sodium chloride 0.9 % 100 mL chemo infusion, and heparin lock flush.  SURGICAL HISTORY:  Past Surgical History  Procedure Laterality Date  . Wisdom tooth extraction    . Laparotomy  12/13/2012    Procedure: EXPLORATORY LAPAROTOMY;  Surgeon: Alvino Chapel, MD;  Location: [facility];  Service: Gynecology;  Laterality: N/A;  . Abdominal hysterectomy  12/13/2012    Procedure: HYSTERECTOMY ABDOMINAL;  Surgeon: Alvino Chapel, MD;  Location: [facility];  Service: Gynecology;  Laterality: N/A;  . Salpingoophorectomy  12/13/2012    Procedure: SALPINGO OOPHORECTOMY;  Surgeon: Alvino Chapel, MD;  Location: [facility];  Service: Gynecology;   Laterality: Bilateral;  . Omentectomy  12/13/2012    Procedure: OMENTECTOMY;  Surgeon: Alvino Chapel, MD;  Location: [facility];  Service: Gynecology;  Laterality: N/A;  . Colonoscopy N/A 02/14/2013    Procedure: COLONOSCOPY;  Surgeon: Danie Binder, MD;  Location: [facility];  Service: Endoscopy;  Laterality: N/A;  2:00 PM-moved to [place] notified pt  . Partial colectomy N/A 03/01/2013    Procedure: PARTIAL COLECTOMY;  Surgeon: Jamesetta So, MD;  Location: [facility];  Service: General;  Laterality: N/A;  . Portacath placement Left 03/01/2013    Procedure: INSERTION PORT-A-CATH;  Surgeon: Jamesetta So, MD;  Location: [facility];  Service: General;  Laterality: Left;    FAMILY HISTORY: family history includes Diabetes in her maternal grandmother, mother, and sister; Hypertension in her brother, maternal grandmother, mother, and sister; Stroke in her maternal aunt. There is no history of Colon cancer.  SOCIAL HISTORY:  reports that she quit smoking about 26 years ago. Her smoking use included Cigarettes. She has a 37.5 pack-year smoking history. She has never used smokeless tobacco. She reports that she drinks alcohol. She reports that she does not use illicit drugs.  REVIEW OF SYSTEMS:  Other than that discussed above is noncontributory.  PHYSICAL EXAMINATION: ECOG PERFORMANCE STATUS: 1 - Symptomatic but completely ambulatory  Last menstrual period 10/07/2012.  GENERAL:alert, no distress and comfortable. Moderately obese. SKIN: skin color, texture, turgor are normal, no rashes or significant lesions EYES: PERLA; Conjunctiva are pink and non-injected, sclera clear SINUSES: No redness or tenderness over maxillary or ethmoid sinuses OROPHARYNX:no exudate, no erythema on lips, buccal mucosa, or tongue. NECK: supple, thyroid normal size, non-tender, without nodularity. No masses CHEST: No breast masses. LifePort in place. LYMPH:  no palpable lymphadenopathy in the cervical, axillary or  inguinal LUNGS: clear to auscultation and percussion with normal breathing effort HEART: regular rate & rhythm and no murmurs. ABDOMEN:abdomen soft, non-tender and normal bowel sounds. Obese and soft with no organomegaly, ascites, or CVA tenderness. MUSCULOSKELETAL:no cyanosis of digits and no clubbing. Range of motion normal.  NEURO: alert & oriented x 3 with fluent speech, no focal motor/sensory deficits   LABORATORY DATA: Appointment on 07/11/2014  Component Date Value Ref Range Status  . Specific Gravity, Urine 07/11/2014 1.025  1.005 - 1.030 Final  . pH 07/11/2014 5.5  5.0 - 8.0 Final  . Glucose, UA 07/11/2014 NEGATIVE  NEGATIVE mg/dL Final  . Hgb urine dipstick 07/11/2014 NEGATIVE  NEGATIVE Final  . Bilirubin Urine 07/11/2014 NEGATIVE  NEGATIVE Final  . Ketones, ur 07/11/2014 NEGATIVE  NEGATIVE mg/dL Final  . Protein, ur 07/11/2014 100* NEGATIVE mg/dL Final  . Urobilinogen, UA 07/11/2014 0.2  0.0 - 1.0 mg/dL Final  . Nitrite 07/11/2014 NEGATIVE  NEGATIVE Final  . Leukocytes, UA 07/11/2014 NEGATIVE  NEGATIVE Final  . Sodium 07/11/2014 141  137 - 147 mEq/L Final  . Potassium 07/11/2014 4.6  3.7 - 5.3 mEq/L Final  . Chloride 07/11/2014 101  96 - 112 mEq/L Final  . CO2 07/11/2014 25  19 - 32 mEq/L Final  . Glucose, Bld 07/11/2014 229* 70 - 99 mg/dL Final  . BUN 07/11/2014 15  6 - 23 mg/dL Final  . Creatinine, Ser 07/11/2014 0.75  0.50 - 1.10 mg/dL Final  . Calcium 07/11/2014 9.6  8.4 - 10.5  mg/dL Final  . Total Protein 07/11/2014 7.4  6.0 - 8.3 g/dL Final  . Albumin 07/11/2014 3.6  3.5 - 5.2 g/dL Final  . AST 07/11/2014 19  0 - 37 U/L Final  . ALT 07/11/2014 12  0 - 35 U/L Final  . Alkaline Phosphatase 07/11/2014 59  39 - 117 U/L Final  . Total Bilirubin 07/11/2014 0.3  0.3 - 1.2 mg/dL Final  . GFR calc non Af Amer 07/11/2014 >90  >90 mL/min Final  . GFR calc Af Amer 07/11/2014 >90  >90 mL/min Final   Comment: (NOTE)                          The eGFR has been calculated using  the CKD EPI equation.                          This calculation has not been validated in all clinical situations.                          eGFR's persistently <90 mL/min signify possible Chronic Kidney                          Disease.  . Anion gap 07/11/2014 15  5 - 15 Final  . WBC 07/11/2014 6.8  4.0 - 10.5 K/uL Final  . RBC 07/11/2014 5.03  3.87 - 5.11 MIL/uL Final  . Hemoglobin 07/11/2014 14.3  12.0 - 15.0 g/dL Final  . HCT 07/11/2014 43.0  36.0 - 46.0 % Final  . MCV 07/11/2014 85.5  78.0 - 100.0 fL Final  . MCH 07/11/2014 28.4  26.0 - 34.0 pg Final  . MCHC 07/11/2014 33.3  30.0 - 36.0 g/dL Final  . RDW 07/11/2014 14.0  11.5 - 15.5 % Final  . Platelets 07/11/2014 149* 150 - 400 K/uL Final  . Neutrophils Relative % 07/11/2014 46  43 - 77 % Final  . Neutro Abs 07/11/2014 3.2  1.7 - 7.7 K/uL Final  . Lymphocytes Relative 07/11/2014 44  12 - 46 % Final  . Lymphs Abs 07/11/2014 3.0  0.7 - 4.0 K/uL Final  . Monocytes Relative 07/11/2014 7  3 - 12 % Final  . Monocytes Absolute 07/11/2014 0.4  0.1 - 1.0 K/uL Final  . Eosinophils Relative 07/11/2014 3  0 - 5 % Final  . Eosinophils Absolute 07/11/2014 0.2  0.0 - 0.7 K/uL Final  . Basophils Relative 07/11/2014 0  0 - 1 % Final  . Basophils Absolute 07/11/2014 0.0  0.0 - 0.1 K/uL Final  Lab on 06/20/2014  Component Date Value Ref Range Status  . Specific Gravity, Urine 06/20/2014 1.025  1.005 - 1.030 Final  . pH 06/20/2014 6.0  5.0 - 8.0 Final  . Glucose, UA 06/20/2014 NEGATIVE  NEGATIVE mg/dL Final  . Hgb urine dipstick 06/20/2014 NEGATIVE  NEGATIVE Final  . Bilirubin Urine 06/20/2014 NEGATIVE  NEGATIVE Final  . Ketones, ur 06/20/2014 NEGATIVE  NEGATIVE mg/dL Final  . Protein, ur 06/20/2014 100* NEGATIVE mg/dL Final  . Urobilinogen, UA 06/20/2014 0.2  0.0 - 1.0 mg/dL Final  . Nitrite 06/20/2014 NEGATIVE  NEGATIVE Final  . Leukocytes, UA 06/20/2014 NEGATIVE  NEGATIVE Final  . WBC 06/20/2014 5.9  4.0 - 10.5 K/uL Final  . RBC 06/20/2014  4.68  3.87 - 5.11 MIL/uL Final  . Hemoglobin 06/20/2014 13.6  12.0 -  15.0 g/dL Final  . HCT 06/20/2014 40.8  36.0 - 46.0 % Final  . MCV 06/20/2014 87.2  78.0 - 100.0 fL Final  . MCH 06/20/2014 29.1  26.0 - 34.0 pg Final  . MCHC 06/20/2014 33.3  30.0 - 36.0 g/dL Final  . RDW 06/20/2014 14.1  11.5 - 15.5 % Final  . Platelets 06/20/2014 160  150 - 400 K/uL Final  . Neutrophils Relative % 06/20/2014 44  43 - 77 % Final  . Neutro Abs 06/20/2014 2.6  1.7 - 7.7 K/uL Final  . Lymphocytes Relative 06/20/2014 46  12 - 46 % Final  . Lymphs Abs 06/20/2014 2.7  0.7 - 4.0 K/uL Final  . Monocytes Relative 06/20/2014 6  3 - 12 % Final  . Monocytes Absolute 06/20/2014 0.3  0.1 - 1.0 K/uL Final  . Eosinophils Relative 06/20/2014 4  0 - 5 % Final  . Eosinophils Absolute 06/20/2014 0.2  0.0 - 0.7 K/uL Final  . Basophils Relative 06/20/2014 0  0 - 1 % Final  . Basophils Absolute 06/20/2014 0.0  0.0 - 0.1 K/uL Final  . Sodium 06/20/2014 142  137 - 147 mEq/L Final  . Potassium 06/20/2014 4.8  3.7 - 5.3 mEq/L Final  . Chloride 06/20/2014 104  96 - 112 mEq/L Final  . CO2 06/20/2014 29  19 - 32 mEq/L Final  . Glucose, Bld 06/20/2014 179* 70 - 99 mg/dL Final  . BUN 06/20/2014 11  6 - 23 mg/dL Final  . Creatinine, Ser 06/20/2014 0.64  0.50 - 1.10 mg/dL Final  . Calcium 06/20/2014 9.1  8.4 - 10.5 mg/dL Final  . Total Protein 06/20/2014 6.8  6.0 - 8.3 g/dL Final  . Albumin 06/20/2014 3.2* 3.5 - 5.2 g/dL Final  . AST 06/20/2014 13  0 - 37 U/L Final  . ALT 06/20/2014 9  0 - 35 U/L Final  . Alkaline Phosphatase 06/20/2014 50  39 - 117 U/L Final  . Total Bilirubin 06/20/2014 0.2* 0.3 - 1.2 mg/dL Final  . GFR calc non Af Amer 06/20/2014 >90  >90 mL/min Final  . GFR calc Af Amer 06/20/2014 >90  >90 mL/min Final   Comment: (NOTE)                          The eGFR has been calculated using the CKD EPI equation.                          This calculation has not been validated in all clinical situations.                           eGFR's persistently <90 mL/min signify possible Chronic Kidney                          Disease.  . Anion gap 06/20/2014 9  5 - 15 Final  . CEA 06/20/2014 2.6  0.0 - 5.0 ng/mL Final   Performed at [place]: K-ras wild-type adenocarcinoma  Urinalysis    Component Value Date/Time   COLORURINE YELLOW 10/26/2012 2121   APPEARANCEUR CLEAR 10/26/2012 2121   LABSPEC 1.025 07/11/2014 0841   PHURINE 5.5 07/11/2014 [place] 07/11/2014 [place] 07/11/2014 [place] NEGATIVE 07/11/2014 [place] 07/11/2014 [place]  100* 07/11/2014 0841   UROBILINOGEN 0.2 07/11/2014 0841   NITRITE NEGATIVE 07/11/2014 0841   LEUKOCYTESUR NEGATIVE 07/11/2014 0841    RADIOGRAPHIC STUDIES: No results found.  ASSESSMENT:  #1. Stage IV colon cancer with liver and intra-abdominal metastases, no evidence of disease except for small retroperitoneal lymph nodes on most recent CT scan, status post 12 cycles of FOLFOX plus Avastin with resultant peripheral neuropathy.  #2. Lower extremity neuropathy on gabapentin, stable.  #3. Hypertension better controlled now that costs of medication have been defrayed. #4. Symptomatic allergic rhinitis    PLAN:  #1. Intravenous Avastin every 3 weeks with Xeloda 1000 mg twice a day for 7 days on and 7 days off, currently taking the medication this week. #2. Repeat CTs chest abdomen and pelvis with contrast on 07/25/2014 to compare to previous study. #3. Flonase inhaler 2 puffs in each nostril at bedtime. #4. Followup in 3 weeks with CBC, chem profile, CEA, and continued Avastin therapy if CAT scans are favorable.   All questions were answered. The patient knows to call the clinic with any problems, questions or concerns. We can certainly see the patient much sooner if necessary.   I spent 25 minutes counseling the patient face to face. The total time spent in the appointment was 30 minutes.      Doroteo Bradford, MD 07/11/2014 9:28 AM  DISCLAIMER:  This note was dictated with voice recognition software.  Similar sounding words can inadvertently be transcribed inaccurately and may not be corrected upon review.

## 2014-07-25 ENCOUNTER — Ambulatory Visit (HOSPITAL_COMMUNITY)
Admission: RE | Admit: 2014-07-25 | Discharge: 2014-07-25 | Disposition: A | Discharge status: Home / Self Care | Payer: BC Managed Care – PPO | Source: Ambulatory Visit | Attending: Hematology and Oncology | Admitting: Hematology and Oncology

## 2014-07-25 ENCOUNTER — Other Ambulatory Visit (HOSPITAL_COMMUNITY): Payer: BC Managed Care – PPO

## 2014-07-25 DIAGNOSIS — R599 Enlarged lymph nodes, unspecified: Secondary | ICD-10-CM | POA: Diagnosis not present

## 2014-07-25 DIAGNOSIS — E042 Nontoxic multinodular goiter: Secondary | ICD-10-CM | POA: Diagnosis not present

## 2014-07-25 DIAGNOSIS — C189 Malignant neoplasm of colon, unspecified: Secondary | ICD-10-CM | POA: Diagnosis not present

## 2014-07-25 DIAGNOSIS — I2510 Atherosclerotic heart disease of native coronary artery without angina pectoris: Secondary | ICD-10-CM | POA: Insufficient documentation

## 2014-07-25 DIAGNOSIS — R911 Solitary pulmonary nodule: Secondary | ICD-10-CM | POA: Insufficient documentation

## 2014-07-25 MED ORDER — IOHEXOL 300 MG/ML  SOLN
100.0000 mL | Freq: Once | INTRAMUSCULAR | Status: AC | PRN
  Administered 2014-07-25: 100 mL via INTRAVENOUS

## 2014-08-01 ENCOUNTER — Encounter (HOSPITAL_BASED_OUTPATIENT_CLINIC_OR_DEPARTMENT_OTHER): Payer: BC Managed Care – PPO

## 2014-08-01 ENCOUNTER — Encounter (HOSPITAL_COMMUNITY): Payer: Self-pay

## 2014-08-01 VITALS — BP 154/76 | HR 76 | Temp 98.2°F | Resp 16 | Wt 269.0 lb

## 2014-08-01 DIAGNOSIS — C787 Secondary malignant neoplasm of liver and intrahepatic bile duct: Secondary | ICD-10-CM

## 2014-08-01 DIAGNOSIS — C50919 Malignant neoplasm of unspecified site of unspecified female breast: Secondary | ICD-10-CM

## 2014-08-01 DIAGNOSIS — C182 Malignant neoplasm of ascending colon: Secondary | ICD-10-CM

## 2014-08-01 DIAGNOSIS — G629 Polyneuropathy, unspecified: Secondary | ICD-10-CM

## 2014-08-01 DIAGNOSIS — G609 Hereditary and idiopathic neuropathy, unspecified: Secondary | ICD-10-CM

## 2014-08-01 DIAGNOSIS — E1349 Other specified diabetes mellitus with other diabetic neurological complication: Secondary | ICD-10-CM

## 2014-08-01 DIAGNOSIS — Z5112 Encounter for antineoplastic immunotherapy: Secondary | ICD-10-CM

## 2014-08-01 DIAGNOSIS — C189 Malignant neoplasm of colon, unspecified: Secondary | ICD-10-CM | POA: Diagnosis not present

## 2014-08-01 DIAGNOSIS — E0940 Drug or chemical induced diabetes mellitus with neurological complications with diabetic neuropathy, unspecified: Secondary | ICD-10-CM

## 2014-08-01 DIAGNOSIS — C779 Secondary and unspecified malignant neoplasm of lymph node, unspecified: Secondary | ICD-10-CM

## 2014-08-01 LAB — CBC WITH DIFFERENTIAL/PLATELET
BASOS PCT: 0 % (ref 0–1)
Basophils Absolute: 0 10*3/uL (ref 0.0–0.1)
EOS ABS: 0.3 10*3/uL (ref 0.0–0.7)
Eosinophils Relative: 4 % (ref 0–5)
HEMATOCRIT: 42.6 % (ref 36.0–46.0)
HEMOGLOBIN: 14.2 g/dL (ref 12.0–15.0)
Lymphocytes Relative: 44 % (ref 12–46)
Lymphs Abs: 2.8 10*3/uL (ref 0.7–4.0)
MCH: 28.6 pg (ref 26.0–34.0)
MCHC: 33.3 g/dL (ref 30.0–36.0)
MCV: 85.9 fL (ref 78.0–100.0)
MONOS PCT: 6 % (ref 3–12)
Monocytes Absolute: 0.4 10*3/uL (ref 0.1–1.0)
Neutro Abs: 2.9 10*3/uL (ref 1.7–7.7)
Neutrophils Relative %: 46 % (ref 43–77)
Platelets: 161 10*3/uL (ref 150–400)
RBC: 4.96 MIL/uL (ref 3.87–5.11)
RDW: 14.3 % (ref 11.5–15.5)
WBC: 6.5 10*3/uL (ref 4.0–10.5)

## 2014-08-01 LAB — URINALYSIS, DIPSTICK ONLY
BILIRUBIN URINE: NEGATIVE
GLUCOSE, UA: 500 mg/dL — AB
Hgb urine dipstick: NEGATIVE
Ketones, ur: NEGATIVE mg/dL
Leukocytes, UA: NEGATIVE
Nitrite: NEGATIVE
PH: 5 (ref 5.0–8.0)
Specific Gravity, Urine: 1.01 (ref 1.005–1.030)
Urobilinogen, UA: 0.2 mg/dL (ref 0.0–1.0)

## 2014-08-01 LAB — COMPREHENSIVE METABOLIC PANEL
ALK PHOS: 62 U/L (ref 39–117)
ALT: 15 U/L (ref 0–35)
AST: 16 U/L (ref 0–37)
Albumin: 3.5 g/dL (ref 3.5–5.2)
Anion gap: 15 (ref 5–15)
BUN: 11 mg/dL (ref 6–23)
CO2: 26 meq/L (ref 19–32)
Calcium: 9.1 mg/dL (ref 8.4–10.5)
Chloride: 100 mEq/L (ref 96–112)
Creatinine, Ser: 0.58 mg/dL (ref 0.50–1.10)
GFR calc Af Amer: >90 mL/min (ref >90)
GFR calc non Af Amer: >90 mL/min (ref >90)
Glucose, Bld: 315 mg/dL — ABNORMAL HIGH (ref 70–99)
POTASSIUM: 4 meq/L (ref 3.7–5.3)
SODIUM: 141 meq/L (ref 137–147)
TOTAL PROTEIN: 7.6 g/dL (ref 6.0–8.3)
Total Bilirubin: 0.3 mg/dL (ref 0.3–1.2)

## 2014-08-01 MED ORDER — SODIUM CHLORIDE 0.9 % IJ SOLN
10.0000 mL | Freq: Once | INTRAMUSCULAR | Status: AC
  Administered 2014-08-01: 10 mL via INTRAVENOUS

## 2014-08-01 MED ORDER — HEPARIN SOD (PORK) LOCK FLUSH 100 UNIT/ML IV SOLN
500.0000 [IU] | Freq: Once | INTRAVENOUS | Status: AC
  Administered 2014-08-01: 500 [IU] via INTRAVENOUS
  Filled 2014-08-01: qty 5

## 2014-08-01 MED ORDER — SODIUM CHLORIDE 0.9 % IV SOLN
INTRAVENOUS | Status: DC
  Administered 2014-08-01: 09:00:00 via INTRAVENOUS

## 2014-08-01 MED ORDER — SODIUM CHLORIDE 0.9 % IV SOLN
5.0000 mg/kg | Freq: Once | INTRAVENOUS | Status: AC
  Administered 2014-08-01: 600 mg via INTRAVENOUS
  Filled 2014-08-01: qty 24

## 2014-08-01 MED ORDER — SODIUM CHLORIDE 0.9 % IV SOLN: 5.0000 mg/kg | Freq: Once | INTRAVENOUS | Status: DC

## 2014-08-01 NOTE — Progress Notes (Signed)
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 Stage IV colon cancer    CURRENT THERAPY: Maintenance Xeloda/Avastin since November 2014 with Xeloda given at 1000 mg twice a day for 7 days on and 7 days off and Avastin given every 3 weeks at 5 mg per kilogram.  INTERVAL HISTORY: Monica Allen 58 y.o. female returns for followup of Stage IV colon cancer with liver and intra-abdominal metastases. Now on Avastin + Xeloda maintenance. Appetite has been good. She has practicing dietary indiscretion regarding her diabetes. She denies any fever, night sweats, abdominal pain but does have minimal paresthesias involving both upper extremities. She denies any lower extremity swelling or redness, PND, orthopnea, palpitations, or allergy symptoms. She has occasional right upper quadrant abdominal discomfort after eating. She denies any nausea, vomiting, and does get diarrhea at the conclusion of 7 day's treatment with Xeloda.    MEDICAL HISTORY: Past Medical History  Diagnosis Date  . Diabetes mellitus   . Hypertension   . Enlarged heart   . Goiter   . Nonischemic cardiomyopathy   . Anemia   . 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 Invasive adenocarcinoma of colon 03/08/2013    11/19 LNs POS, THRU TO ADIPOSE TISSUE    INTERIM HISTORY: has THYROMEGALY; DIABETES MELLITUS, UNCONTROLLED, WITH RENAL COMPLICATIONS; ANXIETY; DEPRESSION; HYPERTENSION; CARDIOMYOPATHY, DILATED; CONGESTIVE HEART FAILURE; SINUSITIS, CHRONIC; ALLERGIC RHINITIS; GERD; ONYCHIA AND PARONYCHIA OF TOE; LOW BACK PAIN; Diabetes mellitus; and Invasive adenocarcinoma of colon on her problem list.       Invasive adenocarcinoma of colon   01/02/2013 Initial Diagnosis Left ovary and fallopian tube is positive for invasive adenocarcinoma with LVI and positive pelvis biopsy    02/14/2013 Procedure Ascending colon biopsy revealing invasive adenocarcinoma.  KRAS wildtype   03/01/2013 Surgery Left segmental resection showing invasive adenocarcinoma through the muscularis propria into the pericolonoc fat involving the serosa with LVI.  Clear margins.  11/19 positive lymph nodes for disease   04/04/2013 - 09/12/2013 Chemotherapy FOLFOX + Avastin x 12 cycles   09/26/2013 Imaging CT CAP- No definite signs of metastatic disease in the chest, abdomen or pelvis   10/02/2013 -  Chemotherapy Avastin + Xeloda maintenance.  Avstin dose decreased to 7.5 mg/kg on 01/07/2014 due to poorly controlled HTN.   12/21/2013 Imaging CT CAP- Negative for recurrent or metastatic disease in the chest, abdomen or pelvis.    03/14/2014 Imaging CT CAP- No evidence of thoracic metastasis. No evidence metastasis within the abdomen or pelvis.   07/25/2014 Imaging CT CAP-he slight increase in mediastinal and peripancreatic lymph nodes with a new nodule in the right middle lobe most compatible with inflammatory processes.     ALLERGIES:  is allergic to actos and ibuprofen.  MEDICATIONS: has a current medication list which includes the following prescription(s): aspirin ec, capecitabine, carvedilol, fluticasone, furosemide, gabapentin, glipizide, hydrocodone-acetaminophen, lidocaine-prilocaine, linagliptin-metformin hcl, potassium chloride sa, ramipril, and valsartan, and the following Facility-Administered Medications: sodium chloride.  SURGICAL HISTORY:  Past Surgical History  Procedure Laterality Date  . Wisdom tooth extraction    . Laparotomy  12/13/2012    Procedure: EXPLORATORY LAPAROTOMY;  Surgeon: Alvino Chapel, MD;  Location: [facility];  Service: Gynecology;  Laterality: N/A;  . Abdominal hysterectomy  12/13/2012    Procedure:  HYSTERECTOMY ABDOMINAL;  Surgeon: Alvino Chapel, MD;  Location: [facility];  Service: Gynecology;  Laterality: N/A;  . Salpingoophorectomy  12/13/2012    Procedure: SALPINGO OOPHORECTOMY;  Surgeon: Alvino Chapel, MD;  Location: [facility];  Service: Gynecology;  Laterality: Bilateral;  . Omentectomy  12/13/2012    Procedure: OMENTECTOMY;  Surgeon: Alvino Chapel, MD;  Location: [facility];  Service: Gynecology;  Laterality: N/A;  . Colonoscopy N/A 02/14/2013    Procedure: COLONOSCOPY;  Surgeon: Danie Binder, MD;  Location: [facility];  Service: Endoscopy;  Laterality: N/A;  2:00 PM-moved to [place] notified pt  . Partial colectomy N/A 03/01/2013    Procedure: PARTIAL COLECTOMY;  Surgeon: Jamesetta So, MD;  Location: [facility];  Service: General;  Laterality: N/A;  . Portacath placement Left 03/01/2013    Procedure: INSERTION PORT-A-CATH;  Surgeon: Jamesetta So, MD;  Location: [facility];  Service: General;  Laterality: Left;    FAMILY HISTORY: family history includes Diabetes in her maternal grandmother, mother, and sister; Hypertension in her brother, maternal grandmother, mother, and sister; Stroke in her maternal aunt. There is no history of Colon cancer.  SOCIAL HISTORY:  reports that she quit smoking about 26 years ago. Her smoking use included Cigarettes. She has a 37.5 pack-year smoking history. She has never used smokeless tobacco. She reports that she drinks alcohol. She reports that she does not use illicit drugs.  REVIEW OF SYSTEMS:  Other than that discussed above is noncontributory.  PHYSICAL EXAMINATION: ECOG PERFORMANCE STATUS: 1 - Symptomatic but completely ambulatory  Last menstrual period 10/07/2012.  GENERAL:alert, no distress and comfortable. Morbidly obese. SKIN: skin color, texture, turgor are normal, no rashes or significant lesions EYES: PERLA; Conjunctiva are pink and non-injected, sclera clear SINUSES: No redness or tenderness over maxillary or ethmoid  sinuses OROPHARYNX:no exudate, no erythema on lips, buccal mucosa, or tongue. NECK: supple, thyroid normal size, non-tender, without nodularity. No masses CHEST: Normal AP diameter with no breast masses. LYMPH:  no palpable lymphadenopathy in the cervical, axillary or inguinal LUNGS: clear to auscultation and percussion with normal breathing effort HEART: regular rate & rhythm and no murmurs. ABDOMEN:abdomen soft, non-tender and normal bowel sounds. No organomegaly, ascites, or CVA tenderness. MUSCULOSKELETAL:no cyanosis of digits and no clubbing. Range of motion normal.  NEURO: alert & oriented x 3 with fluent speech, no focal motor/sensory deficits   LABORATORY DATA: Results for AKILI, CUDA (MRN [id number]) as of 08/01/2014 08:29  Ref. Range 05/02/2014 11:07 05/30/2014 08:45 06/20/2014 10:35 07/11/2014 08:41  CEA Latest Range: 0.0-5.0 ng/mL 3.0 3.0 2.6 2.2     Infusion on 08/01/2014  Component Date Value Ref Range Status  . Specific Gravity, Urine 08/01/2014 1.010  1.005 - 1.030 Final  . pH 08/01/2014 5.0  5.0 - 8.0 Final  . Glucose, UA 08/01/2014 500* NEGATIVE mg/dL Final  . Hgb urine dipstick 08/01/2014 NEGATIVE  NEGATIVE Final  . Bilirubin Urine 08/01/2014 NEGATIVE  NEGATIVE Final  . Ketones, ur 08/01/2014 NEGATIVE  NEGATIVE mg/dL Final  . Protein, ur 08/01/2014 TRACE* NEGATIVE mg/dL Final  . Urobilinogen, UA 08/01/2014 0.2  0.0 - 1.0 mg/dL Final  . Nitrite 08/01/2014 NEGATIVE  NEGATIVE Final  . Leukocytes, UA 08/01/2014 NEGATIVE  NEGATIVE Final  . WBC 08/01/2014 6.5  4.0 - 10.5 K/uL Final  . RBC 08/01/2014 4.96  3.87 - 5.11 MIL/uL Final  . Hemoglobin 08/01/2014 14.2  12.0 -  15.0 g/dL Final  . HCT 08/01/2014 42.6  36.0 - 46.0 % Final  . MCV 08/01/2014 85.9  78.0 - 100.0 fL Final  . MCH 08/01/2014 28.6  26.0 - 34.0 pg Final  . MCHC 08/01/2014 33.3  30.0 - 36.0 g/dL Final  . RDW 08/01/2014 14.3  11.5 - 15.5 % Final  . Platelets 08/01/2014 161  150 - 400 K/uL Final  .  Neutrophils Relative % 08/01/2014 46  43 - 77 % Final  . Neutro Abs 08/01/2014 2.9  1.7 - 7.7 K/uL Final  . Lymphocytes Relative 08/01/2014 44  12 - 46 % Final  . Lymphs Abs 08/01/2014 2.8  0.7 - 4.0 K/uL Final  . Monocytes Relative 08/01/2014 6  3 - 12 % Final  . Monocytes Absolute 08/01/2014 0.4  0.1 - 1.0 K/uL Final  . Eosinophils Relative 08/01/2014 4  0 - 5 % Final  . Eosinophils Absolute 08/01/2014 0.3  0.0 - 0.7 K/uL Final  . Basophils Relative 08/01/2014 0  0 - 1 % Final  . Basophils Absolute 08/01/2014 0.0  0.0 - 0.1 K/uL Final  . Sodium 08/01/2014 141  137 - 147 mEq/L Final  . Potassium 08/01/2014 4.0  3.7 - 5.3 mEq/L Final  . Chloride 08/01/2014 100  96 - 112 mEq/L Final  . CO2 08/01/2014 26  19 - 32 mEq/L Final  . Glucose, Bld 08/01/2014 315* 70 - 99 mg/dL Final  . BUN 08/01/2014 11  6 - 23 mg/dL Final  . Creatinine, Ser 08/01/2014 0.58  0.50 - 1.10 mg/dL Final  . Calcium 08/01/2014 9.1  8.4 - 10.5 mg/dL Final  . Total Protein 08/01/2014 7.6  6.0 - 8.3 g/dL Final  . Albumin 08/01/2014 3.5  3.5 - 5.2 g/dL Final  . AST 08/01/2014 16  0 - 37 U/L Final  . ALT 08/01/2014 15  0 - 35 U/L Final  . Alkaline Phosphatase 08/01/2014 62  39 - 117 U/L Final  . Total Bilirubin 08/01/2014 0.3  0.3 - 1.2 mg/dL Final  . GFR calc non Af Amer 08/01/2014 >90  >90 mL/min Final  . GFR calc Af Amer 08/01/2014 >90  >90 mL/min Final   Comment: (NOTE)                          The eGFR has been calculated using the CKD EPI equation.                          This calculation has not been validated in all clinical situations.                          eGFR's persistently <90 mL/min signify possible Chronic Kidney                          Disease.  . Anion gap 08/01/2014 15  5 - 15 Final  Lab on 07/11/2014  Component Date Value Ref Range Status  . Specific Gravity, Urine 07/11/2014 1.025  1.005 - 1.030 Final  . pH 07/11/2014 5.5  5.0 - 8.0 Final  . Glucose, UA 07/11/2014 NEGATIVE  NEGATIVE mg/dL  Final  . Hgb urine dipstick 07/11/2014 NEGATIVE  NEGATIVE Final  . Bilirubin Urine 07/11/2014 NEGATIVE  NEGATIVE Final  . Ketones, ur 07/11/2014 NEGATIVE  NEGATIVE mg/dL Final  . Protein, ur 07/11/2014 100* NEGATIVE mg/dL Final  .  Urobilinogen, UA 07/11/2014 0.2  0.0 - 1.0 mg/dL Final  . Nitrite 07/11/2014 NEGATIVE  NEGATIVE Final  . Leukocytes, UA 07/11/2014 NEGATIVE  NEGATIVE Final  . Sodium 07/11/2014 141  137 - 147 mEq/L Final  . Potassium 07/11/2014 4.6  3.7 - 5.3 mEq/L Final  . Chloride 07/11/2014 101  96 - 112 mEq/L Final  . CO2 07/11/2014 25  19 - 32 mEq/L Final  . Glucose, Bld 07/11/2014 229* 70 - 99 mg/dL Final  . BUN 07/11/2014 15  6 - 23 mg/dL Final  . Creatinine, Ser 07/11/2014 0.75  0.50 - 1.10 mg/dL Final  . Calcium 07/11/2014 9.6  8.4 - 10.5 mg/dL Final  . Total Protein 07/11/2014 7.4  6.0 - 8.3 g/dL Final  . Albumin 07/11/2014 3.6  3.5 - 5.2 g/dL Final  . AST 07/11/2014 19  0 - 37 U/L Final  . ALT 07/11/2014 12  0 - 35 U/L Final  . Alkaline Phosphatase 07/11/2014 59  39 - 117 U/L Final  . Total Bilirubin 07/11/2014 0.3  0.3 - 1.2 mg/dL Final  . GFR calc non Af Amer 07/11/2014 >90  >90 mL/min Final  . GFR calc Af Amer 07/11/2014 >90  >90 mL/min Final   Comment: (NOTE)                          The eGFR has been calculated using the CKD EPI equation.                          This calculation has not been validated in all clinical situations.                          eGFR's persistently <90 mL/min signify possible Chronic Kidney                          Disease.  . Anion gap 07/11/2014 15  5 - 15 Final  . CEA 07/11/2014 2.2  0.0 - 5.0 ng/mL Final   Performed at [facility]  . WBC 07/11/2014 6.8  4.0 - 10.5 K/uL Final  . RBC 07/11/2014 5.03  3.87 - 5.11 MIL/uL Final  . Hemoglobin 07/11/2014 14.3  12.0 - 15.0 g/dL Final  . HCT 07/11/2014 43.0  36.0 - 46.0 % Final  . MCV 07/11/2014 85.5  78.0 - 100.0 fL Final  . MCH 07/11/2014 28.4  26.0 - 34.0 pg Final  .  MCHC 07/11/2014 33.3  30.0 - 36.0 g/dL Final  . RDW 07/11/2014 14.0  11.5 - 15.5 % Final  . Platelets 07/11/2014 149* 150 - 400 K/uL Final  . Neutrophils Relative % 07/11/2014 46  43 - 77 % Final  . Neutro Abs 07/11/2014 3.2  1.7 - 7.7 K/uL Final  . Lymphocytes Relative 07/11/2014 44  12 - 46 % Final  . Lymphs Abs 07/11/2014 3.0  0.7 - 4.0 K/uL Final  . Monocytes Relative 07/11/2014 7  3 - 12 % Final  . Monocytes Absolute 07/11/2014 0.4  0.1 - 1.0 K/uL Final  . Eosinophils Relative 07/11/2014 3  0 - 5 % Final  . Eosinophils Absolute 07/11/2014 0.2  0.0 - 0.7 K/uL Final  . Basophils Relative 07/11/2014 0  0 - 1 % Final  . Basophils Absolute 07/11/2014 0.0  0.0 - 0.1 K/uL Final    PATHOLOGY: K-ras wild-type adenocarcinoma ([id number])  Urinalysis    Component Value Date/Time   COLORURINE YELLOW 10/26/2012 2121   APPEARANCEUR CLEAR 10/26/2012 2121   LABSPEC 1.010 08/01/2014 0834   PHURINE 5.0 08/01/2014 0834   GLUCOSEU 500* 08/01/2014 0834   HGBUR NEGATIVE 08/01/2014 [place] 08/01/2014 [place] 08/01/2014 0834   PROTEINUR TRACE* 08/01/2014 0834   UROBILINOGEN 0.2 08/01/2014 0834   NITRITE NEGATIVE 08/01/2014 0834   LEUKOCYTESUR NEGATIVE 08/01/2014 0834    RADIOGRAPHIC STUDIES: Ct Chest W Contrast  07/25/2014   CLINICAL DATA:  Metastatic colon cancer diagnosed in 2014. Ongoing chemotherapy. Cardiomegaly and hypertension.  EXAM: CT CHEST, ABDOMEN, AND PELVIS WITH CONTRAST  TECHNIQUE: Multidetector CT imaging of the chest, abdomen and pelvis was performed following the standard protocol during bolus administration of intravenous contrast.  CONTRAST:  158m OMNIPAQUE IOHEXOL 300 MG/ML  SOLN  COMPARISON:  Multiple exams, including 03/14/2014  FINDINGS: CT CHEST FINDINGS  Stable enlarged thyroid with bilateral thyroid nodules. These have been previously biopsied in the setting multinodular goiter.  Right lower paratracheal node short axis diameter 0.9 cm, upper  normal but increased in size from prior measurement of 0.5 cm. Subcarinal lymph node 1.2 cm, again within normal limits but greater than prior measurement of 0.6 cm.  Coronary atherosclerosis proximally in the left anterior descending artery.  New 0.4 cm right middle lobe pulmonary nodule, image 35 series 2.  CT ABDOMEN AND PELVIS FINDINGS  Body habitus reduces diagnostic sensitivity and specificity.  Hepatobiliary: Contracted gallbladder.  Otherwise negative.  Spleen: Intact  Pancreas: Intact  Stomach/Bowel: Multiple ventral hernias containing loops of non strangulated small bowel and adipose tissue. Postoperative findings from right hemicolectomy.  Adrenals/urinary tract: Intact  Vascular/Lymphatic: Anterior superior peripancreatic lymph node measures 1.2 cm in short axis diameter, formerly 0.8 cm. Periaortic lymph node short axis diameter 0.9 cm, formerly 0.6 cm. Aortoiliac atherosclerotic vascular disease.  Reproductive: Uterus absent.  Ovaries not well seen.  Musculoskeletal: Multiple anterior abdominal wall hernias as detailed above. Mild sclerosis along the sacroiliac joints.  IMPRESSION: 1. One of the peripancreatic lymph nodes is mildly pathologically enlarged, and several mediastinal lymph nodes have increased in size compared to the prior exam but remain in the upper normal size. This could be a very early sign of recurrence, although reactive lymph nodes could have a similar appearance. Short-term followup CT these suggested to assess for it further change ; alternatively, PET-CT could be utilized for further characterization. 2. New 4 mm right middle lobe pulmonary nodule. Again, this could be inflammatory, but merits careful observation. 3. Similar appearance of multiple anterior abdominal wall hernias containing non strangulated small-bowel and adipose tissue. 4. Coronary atherosclerosis. 5. Multinodular goiter.   Electronically Signed   By: WSherryl BartersM.D.   On: 07/25/2014 13:53        ASSESSMENT:  #1. Stage IV colon cancer with liver and intra-abdominal metastases, no evidence of disease except for small retroperitoneal lymph nodes on most recent CT scan, status post 12 cycles of FOLFOX plus Avastin with resultant peripheral neuropathy. New nodule on CT scan of the long may be related to inflammatory process in addition to peripancreatic and mediastinal nodes. #2. Lower extremity neuropathy on gabapentin, stable.  #3. Hypertension better controlled now that costs of medication have been defrayed.  #4. Allergic rhinitis    PLAN:  #1. Xeloda 1000 mg TWICE a day for 7 days on and 7 days off, currently on and off week. #2. Continue Avastin intravenously every 3 weeks. #  3. Pay closer attention to dietary intake to optimize blood sugars. #4. Avastin IV in 3 weeks with office visit in 6 weeks.   All questions were answered. The patient knows to call the clinic with any problems, questions or concerns. We can certainly see the patient much sooner if necessary.   I spent 25 minutes counseling the patient face to face. The total time spent in the appointment was 30 minutes.    Doroteo Bradford, MD 08/01/2014 11:15 AM  DISCLAIMER:  This note was dictated with voice recognition software.  Similar sounding words can inadvertently be transcribed inaccurately and may not be corrected upon review.

## 2014-08-01 NOTE — Patient Instructions (Signed)
[facility] Discharge Instructions  RECOMMENDATIONS MADE BY THE CONSULTANT AND ANY TEST RESULTS WILL BE SENT TO YOUR REFERRING PHYSICIAN.  EXAM FINDINGS BY THE PHYSICIAN TODAY AND SIGNS OR SYMPTOMS TO REPORT TO CLINIC OR PRIMARY PHYSICIAN: Exam and findings as discussed by Dr.Formanek.  MEDICATIONS PRESCRIBED:  Continue as prescribed.  INSTRUCTIONS/FOLLOW-UP: Continue Xeloda as prescribed. Continue Avastin every 3 weeks. MD appointment again in 6 weeks.  Thank you for choosing [facility] to provide your oncology and hematology care.  To afford each patient quality time with our providers, please arrive at least 15 minutes before your scheduled appointment time.  With your help, our goal is to use those 15 minutes to complete the necessary work-up to ensure our physicians have the information they need to help with your evaluation and healthcare recommendations.    Effective January 1st, 2014, we ask that you re-schedule your appointment with our physicians should you arrive 10 or more minutes late for your appointment.  We strive to give you quality time with our providers, and arriving late affects you and other patients whose appointments are after yours.    Again, thank you for choosing [facility].  Our hope is that these requests will decrease the amount of time that you wait before being seen by our physicians.       _____________________________________________________________  Should you have questions after your visit to [facility], please contact our office at [phone] between the hours of 8:30 a.m. and 4:30 p.m.  Voicemails left after 4:30 p.m. will not be returned until the following business day.  For prescription refill requests, have your pharmacy contact our office with your prescription refill request.    _______________________________________________________________  We hope that we have given you very  good care.  You may receive a patient satisfaction survey in the mail, please complete it and return it as soon as possible.  We value your feedback!  _______________________________________________________________  Have you asked about our STAR program?  STAR stands for Survivorship Training and Rehabilitation, and this is a nationally recognized cancer care program that focuses on survivorship and rehabilitation.  Cancer and cancer treatments may cause problems, such as, pain, making you feel tired and keeping you from doing the things that you need or want to do. Cancer rehabilitation can help. Our goal is to reduce these troubling effects and help you have the best quality of life possible.  You may receive a survey from a nurse that asks questions about your current state of health.  Based on the survey results, all eligible patients will be referred to the [facility] program for an evaluation so we can better serve you!  A frequently asked questions sheet is available upon request.

## 2014-08-01 NOTE — Progress Notes (Signed)
Tolerated well

## 2014-08-02 LAB — CEA: CEA: 2.6 ng/mL (ref 0.0–5.0)

## 2014-08-22 ENCOUNTER — Inpatient Hospital Stay (HOSPITAL_COMMUNITY): Payer: BC Managed Care – PPO

## 2014-08-24 ENCOUNTER — Encounter (HOSPITAL_COMMUNITY): Payer: BC Managed Care – PPO | Attending: Oncology

## 2014-08-24 ENCOUNTER — Encounter (HOSPITAL_COMMUNITY): Payer: Self-pay

## 2014-08-24 VITALS — BP 112/87 | HR 86 | Temp 97.8°F | Resp 20 | Wt 270.0 lb

## 2014-08-24 DIAGNOSIS — Z5112 Encounter for antineoplastic immunotherapy: Secondary | ICD-10-CM

## 2014-08-24 DIAGNOSIS — C787 Secondary malignant neoplasm of liver and intrahepatic bile duct: Secondary | ICD-10-CM

## 2014-08-24 DIAGNOSIS — C779 Secondary and unspecified malignant neoplasm of lymph node, unspecified: Secondary | ICD-10-CM

## 2014-08-24 DIAGNOSIS — C189 Malignant neoplasm of colon, unspecified: Secondary | ICD-10-CM | POA: Insufficient documentation

## 2014-08-24 DIAGNOSIS — C182 Malignant neoplasm of ascending colon: Secondary | ICD-10-CM

## 2014-08-24 LAB — CBC WITH DIFFERENTIAL/PLATELET
BASOS PCT: 0 % (ref 0–1)
Basophils Absolute: 0 10*3/uL (ref 0.0–0.1)
EOS ABS: 0.2 10*3/uL (ref 0.0–0.7)
EOS PCT: 3 % (ref 0–5)
HCT: 38.8 % (ref 36.0–46.0)
HEMOGLOBIN: 13 g/dL (ref 12.0–15.0)
LYMPHS ABS: 3.1 10*3/uL (ref 0.7–4.0)
Lymphocytes Relative: 45 % (ref 12–46)
MCH: 28.3 pg (ref 26.0–34.0)
MCHC: 33.5 g/dL (ref 30.0–36.0)
MCV: 84.3 fL (ref 78.0–100.0)
MONO ABS: 0.4 10*3/uL (ref 0.1–1.0)
MONOS PCT: 6 % (ref 3–12)
Neutro Abs: 3.2 10*3/uL (ref 1.7–7.7)
Neutrophils Relative %: 46 % (ref 43–77)
Platelets: 143 10*3/uL — ABNORMAL LOW (ref 150–400)
RBC: 4.6 MIL/uL (ref 3.87–5.11)
RDW: 13.9 % (ref 11.5–15.5)
WBC: 6.9 10*3/uL (ref 4.0–10.5)

## 2014-08-24 LAB — COMPREHENSIVE METABOLIC PANEL
ALBUMIN: 3.2 g/dL — AB (ref 3.5–5.2)
ALT: 18 U/L (ref 0–35)
AST: 18 U/L (ref 0–37)
Alkaline Phosphatase: 57 U/L (ref 39–117)
Anion gap: 13 (ref 5–15)
BUN: 16 mg/dL (ref 6–23)
CALCIUM: 8.7 mg/dL (ref 8.4–10.5)
CO2: 25 mEq/L (ref 19–32)
CREATININE: 0.62 mg/dL (ref 0.50–1.10)
Chloride: 101 mEq/L (ref 96–112)
GFR calc non Af Amer: >90 mL/min (ref >90)
GLUCOSE: 214 mg/dL — AB (ref 70–99)
Potassium: 3.8 mEq/L (ref 3.7–5.3)
Sodium: 139 mEq/L (ref 137–147)
TOTAL PROTEIN: 7 g/dL (ref 6.0–8.3)
Total Bilirubin: 0.4 mg/dL (ref 0.3–1.2)

## 2014-08-24 LAB — URINALYSIS, DIPSTICK ONLY
BILIRUBIN URINE: NEGATIVE
Glucose, UA: NEGATIVE mg/dL
HGB URINE DIPSTICK: NEGATIVE
Ketones, ur: NEGATIVE mg/dL
Leukocytes, UA: NEGATIVE
Nitrite: NEGATIVE
PH: 6 (ref 5.0–8.0)
Protein, ur: 100 mg/dL — AB
Specific Gravity, Urine: 1.02 (ref 1.005–1.030)
Urobilinogen, UA: 0.2 mg/dL (ref 0.0–1.0)

## 2014-08-24 MED ORDER — HEPARIN SOD (PORK) LOCK FLUSH 100 UNIT/ML IV SOLN
INTRAVENOUS | Status: AC
  Filled 2014-08-24: qty 5

## 2014-08-24 MED ORDER — SODIUM CHLORIDE 0.9 % IV SOLN
600.0000 mg | Freq: Once | INTRAVENOUS | Status: AC
  Administered 2014-08-24: 600 mg via INTRAVENOUS
  Filled 2014-08-24: qty 24

## 2014-08-24 MED ORDER — CLONIDINE HCL 0.1 MG PO TABS
0.1000 mg | ORAL_TABLET | Freq: Once | ORAL | Status: AC
  Administered 2014-08-24: 0.1 mg via ORAL
  Filled 2014-08-24: qty 1

## 2014-08-24 MED ORDER — SODIUM CHLORIDE 0.9 % IV SOLN
Freq: Once | INTRAVENOUS | Status: AC
  Administered 2014-08-24: 09:00:00 via INTRAVENOUS

## 2014-08-24 MED ORDER — HEPARIN SOD (PORK) LOCK FLUSH 100 UNIT/ML IV SOLN
500.0000 [IU] | Freq: Once | INTRAVENOUS | Status: AC
  Administered 2014-08-24: 500 [IU] via INTRAVENOUS

## 2014-08-24 NOTE — Progress Notes (Signed)
0925:  Dr. Barnet Glasgow notified of UA protein 100 and BP 186/83.  Order received for clonidine 0.1mg ; okay to treat per MD.

## 2014-08-24 NOTE — Patient Instructions (Signed)
[facility] Discharge Instructions for Patients Receiving Chemotherapy  Today you received the following chemotherapy agents:  Avastin  If you develop nausea and vomiting, or diarrhea that is not controlled by your medication, call the clinic.  The clinic phone number is [phone]. Office hours are Monday-Friday 8:30am-5:00pm.  BELOW ARE SYMPTOMS THAT SHOULD BE REPORTED IMMEDIATELY:  *FEVER GREATER THAN 101.0 F  *CHILLS WITH OR WITHOUT FEVER  NAUSEA AND VOMITING THAT IS NOT CONTROLLED WITH YOUR NAUSEA MEDICATION  *UNUSUAL SHORTNESS OF BREATH  *UNUSUAL BRUISING OR BLEEDING  TENDERNESS IN MOUTH AND THROAT WITH OR WITHOUT PRESENCE OF ULCERS  *URINARY PROBLEMS  *BOWEL PROBLEMS  UNUSUAL RASH Items with * indicate a potential emergency and should be followed up as soon as possible. If you have an emergency after office hours please contact your primary care physician or go to the nearest emergency department.  Please call the clinic during office hours if you have any questions or concerns.   You may also contact the Patient Navigator at [phone] should you have any questions or need assistance in obtaining follow up care. _____________________________________________________________________ Have you asked about our STAR program?    STAR stands for Survivorship Training and Rehabilitation, and this is a nationally recognized cancer care program that focuses on survivorship and rehabilitation.  Cancer and cancer treatments may cause problems, such as, pain, making you feel tired and keeping you from doing the things that you need or want to do. Cancer rehabilitation can help. Our goal is to reduce these troubling effects and help you have the best quality of life possible.  You may receive a survey from a nurse that asks questions about your current state of health.  Based on the survey results, all eligible patients will be referred to the [facility] program for an  evaluation so we can better serve you! A frequently asked questions sheet is available upon request.

## 2014-08-25 LAB — CEA: CEA: 2.7 ng/mL (ref 0.0–5.0)

## 2014-09-12 ENCOUNTER — Encounter (HOSPITAL_COMMUNITY): Payer: Self-pay

## 2014-09-12 ENCOUNTER — Encounter: Payer: Self-pay | Admitting: *Deleted

## 2014-09-12 ENCOUNTER — Encounter (HOSPITAL_COMMUNITY): Payer: BC Managed Care – PPO | Attending: Oncology

## 2014-09-12 ENCOUNTER — Telehealth (HOSPITAL_COMMUNITY): Payer: Self-pay | Admitting: Emergency Medicine

## 2014-09-12 ENCOUNTER — Encounter (HOSPITAL_BASED_OUTPATIENT_CLINIC_OR_DEPARTMENT_OTHER): Payer: BC Managed Care – PPO

## 2014-09-12 VITALS — BP 192/83 | HR 69 | Temp 97.8°F | Resp 18 | Wt 270.8 lb

## 2014-09-12 DIAGNOSIS — C189 Malignant neoplasm of colon, unspecified: Secondary | ICD-10-CM | POA: Diagnosis present

## 2014-09-12 DIAGNOSIS — C787 Secondary malignant neoplasm of liver and intrahepatic bile duct: Secondary | ICD-10-CM

## 2014-09-12 DIAGNOSIS — Z5112 Encounter for antineoplastic immunotherapy: Secondary | ICD-10-CM

## 2014-09-12 DIAGNOSIS — C182 Malignant neoplasm of ascending colon: Secondary | ICD-10-CM

## 2014-09-12 DIAGNOSIS — G609 Hereditary and idiopathic neuropathy, unspecified: Secondary | ICD-10-CM

## 2014-09-12 DIAGNOSIS — I10 Essential (primary) hypertension: Secondary | ICD-10-CM

## 2014-09-12 LAB — URINALYSIS, DIPSTICK ONLY
BILIRUBIN URINE: NEGATIVE
Glucose, UA: 100 mg/dL — AB
Hgb urine dipstick: NEGATIVE
Ketones, ur: NEGATIVE mg/dL
Leukocytes, UA: NEGATIVE
NITRITE: NEGATIVE
PH: 6 (ref 5.0–8.0)
Protein, ur: 100 mg/dL — AB
Urobilinogen, UA: 0.2 mg/dL (ref 0.0–1.0)

## 2014-09-12 LAB — COMPREHENSIVE METABOLIC PANEL
ALK PHOS: 59 U/L (ref 39–117)
ALT: 12 U/L (ref 0–35)
AST: 14 U/L (ref 0–37)
Albumin: 3.3 g/dL — ABNORMAL LOW (ref 3.5–5.2)
Anion gap: 11 (ref 5–15)
BILIRUBIN TOTAL: 0.4 mg/dL (ref 0.3–1.2)
BUN: 13 mg/dL (ref 6–23)
CO2: 26 mEq/L (ref 19–32)
Calcium: 8.9 mg/dL (ref 8.4–10.5)
Chloride: 104 mEq/L (ref 96–112)
Creatinine, Ser: 0.6 mg/dL (ref 0.50–1.10)
GFR calc non Af Amer: >90 mL/min (ref >90)
Glucose, Bld: 254 mg/dL — ABNORMAL HIGH (ref 70–99)
POTASSIUM: 4 meq/L (ref 3.7–5.3)
SODIUM: 141 meq/L (ref 137–147)
TOTAL PROTEIN: 7.2 g/dL (ref 6.0–8.3)

## 2014-09-12 LAB — CEA: CEA: 2.9 ng/mL (ref 0.0–5.0)

## 2014-09-12 MED ORDER — HEPARIN SOD (PORK) LOCK FLUSH 100 UNIT/ML IV SOLN
500.0000 [IU] | Freq: Once | INTRAVENOUS | Status: AC
  Administered 2014-09-12: 500 [IU] via INTRAVENOUS

## 2014-09-12 MED ORDER — BEVACIZUMAB CHEMO INJECTION 400 MG/16ML
600.0000 mg | Freq: Once | INTRAVENOUS | Status: AC
  Administered 2014-09-12: 600 mg via INTRAVENOUS
  Filled 2014-09-12: qty 24

## 2014-09-12 MED ORDER — HEPARIN SOD (PORK) LOCK FLUSH 100 UNIT/ML IV SOLN
INTRAVENOUS | Status: AC
  Filled 2014-09-12: qty 5

## 2014-09-12 MED ORDER — SODIUM CHLORIDE 0.9 % IV SOLN
INTRAVENOUS | Status: DC
  Administered 2014-09-12: 10:00:00 via INTRAVENOUS

## 2014-09-12 NOTE — Progress Notes (Signed)
Patient tolerated infusion well.

## 2014-09-12 NOTE — Progress Notes (Signed)
     [facility]  OFFICE PROGRESS NOTE  Monica Allen,Monica Allen, Monica Allen [address] [place]  [address]  DIAGNOSIS: Invasive adenocarcinoma of colon - Plan: Urinalysis, dipstick only, CBC with Differential, Comprehensive metabolic panel, CEA, Urinalysis, dipstick only  Essential hypertension  Chief Complaint  Patient presents with  . Colon Cancer    CURRENT THERAPY: Maintenance Xeloda/Avastin since November 2014 with Xeloda given at 1000 mg twice a day for 7 days on and 7 days off and Avastin given every 3 weeks at 5 mg per kilogram  INTERVAL HISTORY: Monica Allen 58 y.o. female returns forfollowup of Stage IV colon cancer with liver and intra-abdominal metastases. Now on Avastin + Xeloda maintenance.  She continues to do well with good appetite and normal bowel movements. She denies any fever, night sweats, hand-foot syndrome, diarrhea, cough, wheezing, nasal drip, skin rash, headache, or seizures.  MEDICAL HISTORY: Past Medical History  Diagnosis Date  . Diabetes mellitus   . Hypertension   . Enlarged heart   . Goiter   . Nonischemic cardiomyopathy   . Anemia   . 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 Invasive adenocarcinoma of colon 03/08/2013    11/19 LNs POS, THRU TO ADIPOSE TISSUE    INTERIM HISTORY: has THYROMEGALY; DIABETES MELLITUS, UNCONTROLLED, WITH RENAL COMPLICATIONS; ANXIETY; DEPRESSION; HYPERTENSION; CARDIOMYOPATHY, DILATED; CONGESTIVE HEART FAILURE; SINUSITIS, CHRONIC; ALLERGIC RHINITIS; GERD; ONYCHIA AND PARONYCHIA OF TOE; LOW BACK PAIN; Diabetes mellitus; and Invasive adenocarcinoma of colon on her problem list.     Invasive adenocarcinoma of colon   01/02/2013 Initial Diagnosis Left ovary and fallopian tube is positive for invasive adenocarcinoma with LVI and positive pelvis biopsy    02/14/2013 Procedure Ascending colon biopsy revealing invasive adenocarcinoma.  KRAS wildtype   03/01/2013 Surgery Left segmental  resection showing invasive adenocarcinoma through the muscularis propria into the pericolonoc fat involving the serosa with LVI.  Clear margins.  11/19 positive lymph nodes for disease   04/04/2013 - 09/12/2013 Chemotherapy FOLFOX + Avastin x 12 cycles   09/26/2013 Imaging CT CAP- No definite signs of metastatic disease in the chest, abdomen or pelvis   10/02/2013 -  Chemotherapy Avastin + Xeloda maintenance.  Avstin dose decreased to 7.5 mg/kg on 01/07/2014 due to poorly controlled HTN.   12/21/2013 Imaging CT CAP- Negative for recurrent or metastatic disease in the chest, abdomen or pelvis.    03/14/2014 Imaging CT CAP- No evidence of thoracic metastasis. No evidence metastasis within the abdomen or pelvis.   07/25/2014 Imaging CT CAP-he slight increase in mediastinal and peripancreatic lymph nodes with a new nodule in the right middle lobe most compatible with inflammatory processes.    ALLERGIES:  is allergic to actos and ibuprofen.  MEDICATIONS: has a current medication list which includes the following prescription(s): aspirin ec, capecitabine, carvedilol, fluticasone, furosemide, gabapentin, glipizide, hydrocodone-acetaminophen, lidocaine-prilocaine, linagliptin-metformin hcl, potassium chloride sa, ramipril, and valsartan, and the following Facility-Administered Medications: sodium chloride, bevacizumab (AVASTIN) 600 mg in sodium chloride 0.9 % 100 mL chemo infusion, and heparin lock flush.  SURGICAL HISTORY:  Past Surgical History  Procedure Laterality Date  . Wisdom tooth extraction    . Laparotomy  12/13/2012    Procedure: EXPLORATORY LAPAROTOMY;  Surgeon: Daniel L Clarke-Pearson, MD;  Location: [facility];  Service: Gynecology;  Laterality: N/A;  . Abdominal hysterectomy  12/13/2012    Procedure: HYSTERECTOMY ABDOMINAL;  Surgeon: Daniel L Clarke-Pearson, MD;  Location: [facility];  Service: Gynecology;  Laterality: N/A;  .   Salpingoophorectomy  12/13/2012    Procedure: SALPINGO OOPHORECTOMY;  Surgeon:  Daniel L Clarke-Pearson, MD;  Location: [facility];  Service: Gynecology;  Laterality: Bilateral;  . Omentectomy  12/13/2012    Procedure: OMENTECTOMY;  Surgeon: Daniel L Clarke-Pearson, MD;  Location: [facility];  Service: Gynecology;  Laterality: N/A;  . Colonoscopy N/A 02/14/2013    Procedure: COLONOSCOPY;  Surgeon: Sandi L Fields, MD;  Location: [facility];  Service: Endoscopy;  Laterality: N/A;  2:00 PM-moved to 1115 Kim notified pt  . Partial colectomy N/A 03/01/2013    Procedure: PARTIAL COLECTOMY;  Surgeon: Mark A Jenkins, MD;  Location: [facility];  Service: General;  Laterality: N/A;  . Portacath placement Left 03/01/2013    Procedure: INSERTION PORT-A-CATH;  Surgeon: Mark A Jenkins, MD;  Location: [facility];  Service: General;  Laterality: Left;    FAMILY HISTORY: family history includes Diabetes in her maternal grandmother, mother, and sister; Hypertension in her brother, maternal grandmother, mother, and sister; Stroke in her maternal aunt. There is no history of Colon cancer.  SOCIAL HISTORY:  reports that she quit smoking about 26 years ago. Her smoking use included Cigarettes. She has a 37.5 pack-year smoking history. She has never used smokeless tobacco. She reports that she drinks alcohol. She reports that she does not use illicit drugs.  REVIEW OF SYSTEMS:  Other than that discussed above is noncontributory.  PHYSICAL EXAMINATION: ECOG PERFORMANCE STATUS: 1 - Symptomatic but completely ambulatory  Blood pressure 192/83, pulse 69, temperature 97.8 F (36.6 C), temperature source Oral, resp. rate 18, weight 270 lb 12.8 oz (122.834 kg), last menstrual period 10/07/2012, SpO2 97 %.  GENERAL:alert, no distress and comfortable SKIN: skin color, texture, turgor are normal, no rashes or significant lesions. Hyperpigmentation changes in the creases of the body. EYES: PERLA; Conjunctiva are pink and non-injected, sclera clear SINUSES: No redness or tenderness over maxillary or ethmoid  sinuses OROPHARYNX:no exudate, no erythema on lips, buccal mucosa, or tongue. NECK: supple, thyroid normal size, non-tender, without nodularity. No masses CHEST: LifePort in place. LYMPH:  no palpable lymphadenopathy in the cervical, axillary or inguinal LUNGS: clear to auscultation and percussion with normal breathing effort HEART: regular rate & rhythm and no murmurs. ABDOMEN:abdomen soft, non-tender and normal bowel sounds. No hepatomegaly, ascites, or CVA tenderness. MUSCULOSKELETAL:no cyanosis of digits and no clubbing. Range of motion normal.  NEURO: alert & oriented x 3 with fluent speech, no focal motor/sensory deficits   LABORATORY DATA: Infusion on 09/12/2014  Component Date Value Ref Range Status  . Sodium 09/12/2014 141  137 - 147 mEq/L Final  . Potassium 09/12/2014 4.0  3.7 - 5.3 mEq/L Final  . Chloride 09/12/2014 104  96 - 112 mEq/L Final  . CO2 09/12/2014 26  19 - 32 mEq/L Final  . Glucose, Bld 09/12/2014 254* 70 - 99 mg/dL Final  . BUN 09/12/2014 13  6 - 23 mg/dL Final  . Creatinine, Ser 09/12/2014 0.60  0.50 - 1.10 mg/dL Final  . Calcium 09/12/2014 8.9  8.4 - 10.5 mg/dL Final  . Total Protein 09/12/2014 7.2  6.0 - 8.3 g/dL Final  . Albumin 09/12/2014 3.3* 3.5 - 5.2 g/dL Final  . AST 09/12/2014 14  0 - 37 U/L Final  . ALT 09/12/2014 12  0 - 35 U/L Final  . Alkaline Phosphatase 09/12/2014 59  39 - 117 U/L Final  . Total Bilirubin 09/12/2014 0.4  0.3 - 1.2 mg/dL Final  . GFR calc non Af Amer 09/12/2014 >90  >90   mL/min Final  . GFR calc Af Amer 09/12/2014 >90  >90 mL/min Final   Comment: (NOTE) The eGFR has been calculated using the CKD EPI equation. This calculation has not been validated in all clinical situations. eGFR's persistently <90 mL/min signify possible Chronic Kidney Disease.   . Anion gap 09/12/2014 11  5 - 15 Final  . Specific Gravity, Urine 09/12/2014 >1.030* 1.005 - 1.030 Final  . pH 09/12/2014 6.0  5.0 - 8.0 Final  . Glucose, UA 09/12/2014 100*  NEGATIVE mg/dL Final  . Hgb urine dipstick 09/12/2014 NEGATIVE  NEGATIVE Final  . Bilirubin Urine 09/12/2014 NEGATIVE  NEGATIVE Final  . Ketones, ur 09/12/2014 NEGATIVE  NEGATIVE mg/dL Final  . Protein, ur 09/12/2014 100* NEGATIVE mg/dL Final  . Urobilinogen, UA 09/12/2014 0.2  0.0 - 1.0 mg/dL Final  . Nitrite 09/12/2014 NEGATIVE  NEGATIVE Final  . Leukocytes, UA 09/12/2014 NEGATIVE  NEGATIVE Final  Infusion on 08/24/2014  Component Date Value Ref Range Status  . Specific Gravity, Urine 08/24/2014 1.020  1.005 - 1.030 Final  . pH 08/24/2014 6.0  5.0 - 8.0 Final  . Glucose, UA 08/24/2014 NEGATIVE  NEGATIVE mg/dL Final  . Hgb urine dipstick 08/24/2014 NEGATIVE  NEGATIVE Final  . Bilirubin Urine 08/24/2014 NEGATIVE  NEGATIVE Final  . Ketones, ur 08/24/2014 NEGATIVE  NEGATIVE mg/dL Final  . Protein, ur 08/24/2014 100* NEGATIVE mg/dL Final  . Urobilinogen, UA 08/24/2014 0.2  0.0 - 1.0 mg/dL Final  . Nitrite 08/24/2014 NEGATIVE  NEGATIVE Final  . Leukocytes, UA 08/24/2014 NEGATIVE  NEGATIVE Final  . WBC 08/24/2014 6.9  4.0 - 10.5 K/uL Final  . RBC 08/24/2014 4.60  3.87 - 5.11 MIL/uL Final  . Hemoglobin 08/24/2014 13.0  12.0 - 15.0 g/dL Final  . HCT 08/24/2014 38.8  36.0 - 46.0 % Final  . MCV 08/24/2014 84.3  78.0 - 100.0 fL Final  . MCH 08/24/2014 28.3  26.0 - 34.0 pg Final  . MCHC 08/24/2014 33.5  30.0 - 36.0 g/dL Final  . RDW 08/24/2014 13.9  11.5 - 15.5 % Final  . Platelets 08/24/2014 143* 150 - 400 K/uL Final  . Neutrophils Relative % 08/24/2014 46  43 - 77 % Final  . Neutro Abs 08/24/2014 3.2  1.7 - 7.7 K/uL Final  . Lymphocytes Relative 08/24/2014 45  12 - 46 % Final  . Lymphs Abs 08/24/2014 3.1  0.7 - 4.0 K/uL Final  . Monocytes Relative 08/24/2014 6  3 - 12 % Final  . Monocytes Absolute 08/24/2014 0.4  0.1 - 1.0 K/uL Final  . Eosinophils Relative 08/24/2014 3  0 - 5 % Final  . Eosinophils Absolute 08/24/2014 0.2  0.0 - 0.7 K/uL Final  . Basophils Relative 08/24/2014 0  0 - 1 %  Final  . Basophils Absolute 08/24/2014 0.0  0.0 - 0.1 K/uL Final  . Sodium 08/24/2014 139  137 - 147 mEq/L Final  . Potassium 08/24/2014 3.8  3.7 - 5.3 mEq/L Final  . Chloride 08/24/2014 101  96 - 112 mEq/L Final  . CO2 08/24/2014 25  19 - 32 mEq/L Final  . Glucose, Bld 08/24/2014 214* 70 - 99 mg/dL Final  . BUN 08/24/2014 16  6 - 23 mg/dL Final  . Creatinine, Ser 08/24/2014 0.62  0.50 - 1.10 mg/dL Final  . Calcium 08/24/2014 8.7  8.4 - 10.5 mg/dL Final  . Total Protein 08/24/2014 7.0  6.0 - 8.3 g/dL Final  . Albumin 08/24/2014 3.2* 3.5 - 5.2 g/dL Final  .   AST 08/24/2014 18  0 - 37 U/L Final  . ALT 08/24/2014 18  0 - 35 U/L Final  . Alkaline Phosphatase 08/24/2014 57  39 - 117 U/L Final  . Total Bilirubin 08/24/2014 0.4  0.3 - 1.2 mg/dL Final  . GFR calc non Af Amer 08/24/2014 >90  >90 mL/min Final  . GFR calc Af Amer 08/24/2014 >90  >90 mL/min Final   Comment: (NOTE)                          The eGFR has been calculated using the CKD EPI equation.                          This calculation has not been validated in all clinical situations.                          eGFR's persistently <90 mL/min signify possible Chronic Kidney                          Disease.  . Anion gap 08/24/2014 13  5 - 15 Final  . CEA 08/24/2014 2.7  0.0 - 5.0 ng/mL Final   Performed at [facility] wild-type adenocarcinoma [phone])  Urinalysis    Component Value Date/Time   COLORURINE YELLOW 10/26/2012 2121   APPEARANCEUR CLEAR 10/26/2012 2121   LABSPEC >1.030* 09/12/2014 0854   PHURINE 6.0 09/12/2014 0854   GLUCOSEU 100* 09/12/2014 0854   HGBUR NEGATIVE 09/12/2014 0854   BILIRUBINUR NEGATIVE 09/12/2014 0854   KETONESUR NEGATIVE 09/12/2014 0854   PROTEINUR 100* 09/12/2014 0854   UROBILINOGEN 0.2 09/12/2014 0854   NITRITE NEGATIVE 09/12/2014 0854   LEUKOCYTESUR NEGATIVE 09/12/2014 0854    RADIOGRAPHIC STUDIES: No results found. Contrast   Status: Final result        PACS Images     Show images for CT Abdomen Pelvis W Contrast     Study Result     CLINICAL DATA: Metastatic colon cancer diagnosed in 2014. Ongoing chemotherapy. Cardiomegaly and hypertension.  EXAM: CT CHEST, ABDOMEN, AND PELVIS WITH CONTRAST  TECHNIQUE: Multidetector CT imaging of the chest, abdomen and pelvis was performed following the standard protocol during bolus administration of intravenous contrast.  CONTRAST: 149m OMNIPAQUE IOHEXOL 300 MG/ML SOLN  COMPARISON: Multiple exams, including 03/14/2014  FINDINGS: CT CHEST FINDINGS  Stable enlarged thyroid with bilateral thyroid nodules. These have been previously biopsied in the setting multinodular goiter.  Right lower paratracheal node short axis diameter 0.9 cm, upper normal but increased in size from prior measurement of 0.5 cm. Subcarinal lymph node 1.2 cm, again within normal limits but greater than prior measurement of 0.6 cm.  Coronary atherosclerosis proximally in the left anterior descending artery.  New 0.4 cm right middle lobe pulmonary nodule, image 35 series 2.  CT ABDOMEN AND PELVIS FINDINGS  Body habitus reduces diagnostic sensitivity and specificity.  Hepatobiliary: Contracted gallbladder. Otherwise negative.  Spleen: Intact  Pancreas: Intact  Stomach/Bowel: Multiple ventral hernias containing loops of non strangulated small bowel and adipose tissue. Postoperative findings from right hemicolectomy.  Adrenals/urinary tract: Intact  Vascular/Lymphatic: Anterior superior peripancreatic lymph node measures 1.2 cm in short axis diameter, formerly 0.8 cm. Periaortic lymph node short axis diameter 0.9 cm, formerly 0.6 cm. Aortoiliac atherosclerotic vascular disease.  Reproductive: Uterus absent. Ovaries not well seen.  Musculoskeletal: Multiple  anterior abdominal wall hernias as detailed above. Mild sclerosis along the sacroiliac joints.  IMPRESSION: 1.  One of the peripancreatic lymph nodes is mildly pathologically enlarged, and several mediastinal lymph nodes have increased in size compared to the prior exam but remain in the upper normal size. This could be a very early sign of recurrence, although reactive lymph nodes could have a similar appearance. Short-term followup CT these suggested to assess for it further change ; alternatively, PET-CT could be utilized for further characterization. 2. New 4 mm right middle lobe pulmonary nodule. Again, this could be inflammatory, but merits careful observation. 3. Similar appearance of multiple anterior abdominal wall hernias containing non strangulated small-bowel and adipose tissue. 4. Coronary atherosclerosis. 5. Multinodular goiter.   Electronically Signed  By: Sherryl Barters M.D.  On: 07/25/2014 13:53    ASSESSMENT:  #1. Stage IV colon cancer with liver and intra-abdominal metastases, no evidence of disease except for small retroperitoneal lymph nodes on most recent CT scan, status post 12 cycles of FOLFOX plus Avastin with resultant peripheral neuropathy. New nodule on CT scan of the long may be related to inflammatory process in addition to peripancreatic and mediastinal nodes. #2. Lower extremity neuropathy on gabapentin, stable.  #3. Hypertension better controlled now that costs of medication have been defrayed.  #4. Allergic rhinitis   PLAN:  #1. Xeloda 1000 mg TWICE a day for 7 days on and 7 days off, currently on and off week. #2. Continue Avastin intravenously every 3 weeks. #3. Pay closer attention to dietary intake to optimize blood sugars. #4. Avastin IV in 3 weeks with office visit in 6 weeks.    All questions were answered. The patient knows to call the clinic with any problems, questions or concerns. We can certainly see the patient much sooner if necessary.   I spent 25 minutes counseling the patient face to face. The total time spent in the appointment  was 30 minutes.    Doroteo Bradford, MD 09/12/2014 10:15 AM  DISCLAIMER:  This note was dictated with voice recognition software.  Similar sounding words can inadvertently be transcribed inaccurately and may not be corrected upon review.

## 2014-09-12 NOTE — Patient Instructions (Signed)
[facility] Discharge Instructions for Patients Receiving Chemotherapy  Today you received the following chemotherapy agents Avastin.  Please take your medications as prescribed. Call for any questions or concerns. Follow up as scheduled.    If you develop nausea and vomiting, or diarrhea that is not controlled by your medication, call the clinic.  The clinic phone number is [phone]. Office hours are Monday-Friday 8:30am-5:00pm.  BELOW ARE SYMPTOMS THAT SHOULD BE REPORTED IMMEDIATELY:  *FEVER GREATER THAN 101.0 F  *CHILLS WITH OR WITHOUT FEVER  NAUSEA AND VOMITING THAT IS NOT CONTROLLED WITH YOUR NAUSEA MEDICATION  *UNUSUAL SHORTNESS OF BREATH  *UNUSUAL BRUISING OR BLEEDING  TENDERNESS IN MOUTH AND THROAT WITH OR WITHOUT PRESENCE OF ULCERS  *URINARY PROBLEMS  *BOWEL PROBLEMS  UNUSUAL RASH Items with * indicate a potential emergency and should be followed up as soon as possible. If you have an emergency after office hours please contact your primary care physician or go to the nearest emergency department.  Please call the clinic during office hours if you have any questions or concerns.   You may also contact the Patient Navigator at [phone] should you have any questions or need assistance in obtaining follow up care. _____________________________________________________________________ Have you asked about our STAR program?    STAR stands for Survivorship Training and Rehabilitation, and this is a nationally recognized cancer care program that focuses on survivorship and rehabilitation.  Cancer and cancer treatments may cause problems, such as, pain, making you feel tired and keeping you from doing the things that you need or want to do. Cancer rehabilitation can help. Our goal is to reduce these troubling effects and help you have the best quality of life possible.  You may receive a survey from a nurse that asks questions about your current  state of health.  Based on the survey results, all eligible patients will be referred to the [facility] program for an evaluation so we can better serve you! A frequently asked questions sheet is available upon request.

## 2014-09-12 NOTE — Progress Notes (Signed)
[place] Clinical Social Work  Clinical Social Work was referred by nurse for assessment of psychosocial needs due to pt not meeting with CSW to date. Clinical Social Worker met with patient at [facility]  to offer support and assess for needs. Pt here with her husband today. CSW explained role of CSW and provided pt with CSW info sheet. Pt and husband denied any current issues or concerns and agreed to seek out CSW assistance as needed.      Clinical Social Work interventions:  CSW role education   Loren Racer, [place] Tuesdays 8:30-1pm Wednesdays 8:30-12pm  [phone]

## 2014-09-12 NOTE — Patient Instructions (Signed)
[facility] Discharge Instructions for Patients Receiving Chemotherapy  Today you received the following chemotherapy agents avastin You saw Dr Barnet Glasgow today Tx in 3 weeks and 6 weeks with a follow up with the doctor  To help prevent nausea and vomiting after your treatment, we encourage you to take your nausea medication     If you develop nausea and vomiting that is not controlled by your nausea medication, call the clinic. If it is after clinic hours your family physician or the after hours number for the clinic or go to the Emergency Department.   BELOW ARE SYMPTOMS THAT SHOULD BE REPORTED IMMEDIATELY:  *FEVER GREATER THAN 101.0 F  *CHILLS WITH OR WITHOUT FEVER  NAUSEA AND VOMITING THAT IS NOT CONTROLLED WITH YOUR NAUSEA MEDICATION  *UNUSUAL SHORTNESS OF BREATH  *UNUSUAL BRUISING OR BLEEDING  TENDERNESS IN MOUTH AND THROAT WITH OR WITHOUT PRESENCE OF ULCERS  *URINARY PROBLEMS  *BOWEL PROBLEMS  UNUSUAL RASH Items with * indicate a potential emergency and should be followed up as soon as possible.  One of the nurses will contact you 24 hours after your treatment. Please let the nurse know about any problems that you may have experienced. Feel free to call the clinic you have any questions or concerns. The clinic phone number is [phone].   I have been informed and understand all the instructions given to me. I know to contact the clinic, my physician, or go to the Emergency Department if any problems should occur. I do not have any questions at this time, but understand that I may call the clinic during office hours or the Patient Navigator at [phone] should I have any questions or need assistance in obtaining follow up care.    Bevacizumab injection What is this medicine? BEVACIZUMAB (be va SIZ yoo mab) is a chemotherapy drug. It targets a protein found in many cancer cell types, and halts cancer growth. This drug treats many cancers including  non-small cell lung cancer, ovarian cancer, cervical cancer, and colon or rectal cancer. It is usually given with other chemotherapy drugs. This medicine may be used for other purposes; ask your health care provider or pharmacist if you have questions. COMMON BRAND NAME(S): Avastin What should I tell my health care provider before I take this medicine? They need to know if you have any of these conditions: -blood clots -heart disease, including heart failure, heart attack, or chest pain (angina) -high blood pressure -infection (especially a virus infection such as chickenpox, cold sores, or herpes) -kidney disease -lung disease -prior chemotherapy with doxorubicin, daunorubicin, epirubicin, or other anthracycline type chemotherapy agents -recent or ongoing radiation therapy -recent surgery -stroke -an unusual or allergic reaction to bevacizumab, hamster proteins, mouse proteins, other medicines, foods, dyes, or preservatives -pregnant or trying to get pregnant -breast-feeding How should I use this medicine? This medicine is for infusion into a vein. It is given by a health care professional in a hospital or clinic setting. Talk to your pediatrician regarding the use of this medicine in children. Special care may be needed. Overdosage: If you think you have taken too much of this medicine contact a poison control center or emergency room at once. NOTE: This medicine is only for you. Do not share this medicine with others. What if I miss a dose? It is important not to miss your dose. Call your doctor or health care professional if you are unable to keep an appointment. What may interact with this medicine? Interactions  are not expected. This list may not describe all possible interactions. Give your health care provider a list of all the medicines, herbs, non-prescription drugs, or dietary supplements you use. Also tell them if you smoke, drink alcohol, or use illegal drugs. Some items may  interact with your medicine. What should I watch for while using this medicine? Your condition will be monitored carefully while you are receiving this medicine. You will need important blood work and urine testing done while you are taking this medicine. During your treatment, let your health care professional know if you have any unusual symptoms, such as difficulty breathing. This medicine may rarely cause 'gastrointestinal perforation' (holes in the stomach, intestines or colon), a serious side effect requiring surgery to repair. This medicine should be started at least 28 days following major surgery and the site of the surgery should be totally healed. Check with your doctor before scheduling dental work or surgery while you are receiving this treatment. Talk to your doctor if you have recently had surgery or if you have a wound that has not healed. Do not become pregnant while taking this medicine. Women should inform their doctor if they wish to become pregnant or think they might be pregnant. There is a potential for serious side effects to an unborn child. Talk to your health care professional or pharmacist for more information. Do not breast-feed an infant while taking this medicine. This medicine has caused ovarian failure in some women. This medicine may interfere with the ability to have a child. You should talk to your doctor or health care professional if you are concerned about your fertility. What side effects may I notice from receiving this medicine? Side effects that you should report to your doctor or health care professional as soon as possible: -allergic reactions like skin rash, itching or hives, swelling of the face, lips, or tongue -signs of infection - fever or chills, cough, sore throat, pain or trouble passing urine -signs of decreased platelets or bleeding - bruising, pinpoint red spots on the skin, black, tarry stools, nosebleeds, blood in the urine -breathing  problems -changes in vision -chest pain -confusion -jaw pain, especially after dental work -mouth sores -seizures -severe abdominal pain -severe headache -sudden numbness or weakness of the face, arm or leg -swelling of legs or ankles -symptoms of a stroke: change in mental awareness, inability to talk or move one side of the body (especially in patients with lung cancer) -trouble passing urine or change in the amount of urine -trouble speaking or understanding -trouble walking, dizziness, loss of balance or coordination Side effects that usually do not require medical attention (report to your doctor or health care professional if they continue or are bothersome): -constipation -diarrhea -dry skin -headache -loss of appetite -nausea, vomiting This list may not describe all possible side effects. Call your doctor for medical advice about side effects. You may report side effects to FDA at 1-800-FDA-1088. Where should I keep my medicine? This drug is given in a hospital or clinic and will not be stored at home. NOTE: This sheet is a summary. It may not cover all possible information. If you have questions about this medicine, talk to your doctor, pharmacist, or health care provider.  2015, Elsevier/Gold Standard. (2013-09-26 11:38:34)

## 2014-09-12 NOTE — Telephone Encounter (Signed)
Called to notify pt that blood work was good

## 2014-09-24 ENCOUNTER — Other Ambulatory Visit (HOSPITAL_COMMUNITY): Payer: Self-pay | Admitting: Oncology

## 2014-09-24 DIAGNOSIS — C189 Malignant neoplasm of colon, unspecified: Secondary | ICD-10-CM

## 2014-09-24 MED ORDER — CAPECITABINE 500 MG PO TABS: 1000.0000 mg | ORAL_TABLET | Freq: Two times a day (BID) | ORAL | Status: DC

## 2014-09-25 ENCOUNTER — Other Ambulatory Visit (HOSPITAL_COMMUNITY): Payer: Self-pay | Admitting: Oncology

## 2014-09-25 DIAGNOSIS — I10 Essential (primary) hypertension: Secondary | ICD-10-CM

## 2014-09-25 MED ORDER — RAMIPRIL 10 MG PO CAPS: 10.0000 mg | ORAL_CAPSULE | Freq: Every day | ORAL | Status: DC

## 2014-10-03 ENCOUNTER — Encounter (HOSPITAL_BASED_OUTPATIENT_CLINIC_OR_DEPARTMENT_OTHER): Payer: BC Managed Care – PPO

## 2014-10-03 VITALS — BP 180/81 | HR 56 | Temp 98.5°F | Resp 18 | Wt 267.6 lb

## 2014-10-03 DIAGNOSIS — C189 Malignant neoplasm of colon, unspecified: Secondary | ICD-10-CM | POA: Diagnosis not present

## 2014-10-03 DIAGNOSIS — C182 Malignant neoplasm of ascending colon: Secondary | ICD-10-CM

## 2014-10-03 DIAGNOSIS — C787 Secondary malignant neoplasm of liver and intrahepatic bile duct: Secondary | ICD-10-CM

## 2014-10-03 DIAGNOSIS — G629 Polyneuropathy, unspecified: Secondary | ICD-10-CM

## 2014-10-03 DIAGNOSIS — Z5112 Encounter for antineoplastic immunotherapy: Secondary | ICD-10-CM

## 2014-10-03 LAB — CBC WITH DIFFERENTIAL/PLATELET
BASOS ABS: 0 10*3/uL (ref 0.0–0.1)
Basophils Relative: 0 % (ref 0–1)
EOS PCT: 2 % (ref 0–5)
Eosinophils Absolute: 0.2 10*3/uL (ref 0.0–0.7)
HEMATOCRIT: 41.2 % (ref 36.0–46.0)
Hemoglobin: 13.7 g/dL (ref 12.0–15.0)
LYMPHS ABS: 2.9 10*3/uL (ref 0.7–4.0)
Lymphocytes Relative: 39 % (ref 12–46)
MCH: 28.2 pg (ref 26.0–34.0)
MCHC: 33.3 g/dL (ref 30.0–36.0)
MCV: 84.9 fL (ref 78.0–100.0)
MONO ABS: 0.5 10*3/uL (ref 0.1–1.0)
Monocytes Relative: 7 % (ref 3–12)
Neutro Abs: 3.8 10*3/uL (ref 1.7–7.7)
Neutrophils Relative %: 52 % (ref 43–77)
Platelets: 162 10*3/uL (ref 150–400)
RBC: 4.85 MIL/uL (ref 3.87–5.11)
RDW: 14.4 % (ref 11.5–15.5)
WBC: 7.3 10*3/uL (ref 4.0–10.5)

## 2014-10-03 LAB — URINALYSIS, DIPSTICK ONLY
Bilirubin Urine: NEGATIVE
GLUCOSE, UA: NEGATIVE mg/dL
Hgb urine dipstick: NEGATIVE
KETONES UR: NEGATIVE mg/dL
Leukocytes, UA: NEGATIVE
NITRITE: NEGATIVE
PROTEIN: 100 mg/dL — AB
Specific Gravity, Urine: >1.03 — ABNORMAL HIGH (ref 1.005–1.030)
UROBILINOGEN UA: 1 mg/dL (ref 0.0–1.0)
pH: 6 (ref 5.0–8.0)

## 2014-10-03 LAB — COMPREHENSIVE METABOLIC PANEL
ALBUMIN: 3.3 g/dL — AB (ref 3.5–5.2)
ALT: 21 U/L (ref 0–35)
ANION GAP: 12 (ref 5–15)
AST: 22 U/L (ref 0–37)
Alkaline Phosphatase: 61 U/L (ref 39–117)
BUN: 14 mg/dL (ref 6–23)
CALCIUM: 9.1 mg/dL (ref 8.4–10.5)
CO2: 28 mEq/L (ref 19–32)
CREATININE: 0.62 mg/dL (ref 0.50–1.10)
Chloride: 98 mEq/L (ref 96–112)
GFR calc Af Amer: >90 mL/min (ref >90)
GFR calc non Af Amer: >90 mL/min (ref >90)
Glucose, Bld: 218 mg/dL — ABNORMAL HIGH (ref 70–99)
Potassium: 3.9 mEq/L (ref 3.7–5.3)
Sodium: 138 mEq/L (ref 137–147)
TOTAL PROTEIN: 7 g/dL (ref 6.0–8.3)
Total Bilirubin: 0.4 mg/dL (ref 0.3–1.2)

## 2014-10-03 LAB — CEA: CEA: 2.8 ng/mL (ref 0.0–5.0)

## 2014-10-03 MED ORDER — SODIUM CHLORIDE 0.9 % IJ SOLN
10.0000 mL | Freq: Once | INTRAMUSCULAR | Status: AC
  Administered 2014-10-03: 10 mL via INTRAVENOUS

## 2014-10-03 MED ORDER — SODIUM CHLORIDE 0.9 % IV SOLN
600.0000 mg | Freq: Once | INTRAVENOUS | Status: AC
  Administered 2014-10-03: 600 mg via INTRAVENOUS
  Filled 2014-10-03: qty 24

## 2014-10-03 MED ORDER — GABAPENTIN 300 MG PO CAPS: ORAL_CAPSULE | ORAL | Status: DC

## 2014-10-03 MED ORDER — SODIUM CHLORIDE 0.9 % IV SOLN
INTRAVENOUS | Status: DC
  Administered 2014-10-03: 10:00:00 via INTRAVENOUS

## 2014-10-03 MED ORDER — HEPARIN SOD (PORK) LOCK FLUSH 100 UNIT/ML IV SOLN
500.0000 [IU] | Freq: Once | INTRAVENOUS | Status: AC
  Administered 2014-10-03: 500 [IU] via INTRAVENOUS
  Filled 2014-10-03: qty 5

## 2014-10-03 NOTE — Progress Notes (Signed)
Tolerated well

## 2014-10-03 NOTE — Patient Instructions (Signed)
[facility] Discharge Instructions for Patients Receiving Chemotherapy  Today you received the following chemotherapy agents Avastin. Return as scheduled in 3 weeks for MD appointment and Avastin.   If you develop nausea and vomiting that is not controlled by your nausea medication, call the clinic. If it is after clinic hours your family physician or the after hours number for the clinic or go to the Emergency Department.   BELOW ARE SYMPTOMS THAT SHOULD BE REPORTED IMMEDIATELY:  *FEVER GREATER THAN 101.0 F  *CHILLS WITH OR WITHOUT FEVER  NAUSEA AND VOMITING THAT IS NOT CONTROLLED WITH YOUR NAUSEA MEDICATION  *UNUSUAL SHORTNESS OF BREATH  *UNUSUAL BRUISING OR BLEEDING  TENDERNESS IN MOUTH AND THROAT WITH OR WITHOUT PRESENCE OF ULCERS  *URINARY PROBLEMS  *BOWEL PROBLEMS  UNUSUAL RASH Items with * indicate a potential emergency and should be followed up as soon as possible.  I have been informed and understand all the instructions given to me. I know to contact the clinic, my physician, or go to the Emergency Department if any problems should occur. I do not have any questions at this time, but understand that I may call the clinic during office hours or the Patient Navigator at [phone] should I have any questions or need assistance in obtaining follow up care.    __________________________________________  _____________  __________ Signature of Patient or Authorized Representative            Date                   Time    __________________________________________ Nurse's Signature

## 2014-10-24 ENCOUNTER — Other Ambulatory Visit (HOSPITAL_COMMUNITY): Payer: Self-pay | Admitting: Hematology and Oncology

## 2014-10-24 ENCOUNTER — Encounter (HOSPITAL_COMMUNITY): Payer: Self-pay

## 2014-10-24 ENCOUNTER — Encounter (HOSPITAL_BASED_OUTPATIENT_CLINIC_OR_DEPARTMENT_OTHER): Payer: BC Managed Care – PPO

## 2014-10-24 ENCOUNTER — Encounter (HOSPITAL_COMMUNITY): Payer: BC Managed Care – PPO | Attending: Oncology

## 2014-10-24 ENCOUNTER — Encounter: Payer: Self-pay | Admitting: *Deleted

## 2014-10-24 DIAGNOSIS — C189 Malignant neoplasm of colon, unspecified: Secondary | ICD-10-CM | POA: Insufficient documentation

## 2014-10-24 DIAGNOSIS — Z5112 Encounter for antineoplastic immunotherapy: Secondary | ICD-10-CM

## 2014-10-24 DIAGNOSIS — I10 Essential (primary) hypertension: Secondary | ICD-10-CM

## 2014-10-24 DIAGNOSIS — C182 Malignant neoplasm of ascending colon: Secondary | ICD-10-CM

## 2014-10-24 DIAGNOSIS — C787 Secondary malignant neoplasm of liver and intrahepatic bile duct: Secondary | ICD-10-CM

## 2014-10-24 DIAGNOSIS — G629 Polyneuropathy, unspecified: Secondary | ICD-10-CM

## 2014-10-24 LAB — URINALYSIS, DIPSTICK ONLY
BILIRUBIN URINE: NEGATIVE
Glucose, UA: 500 mg/dL — AB
Hgb urine dipstick: NEGATIVE
KETONES UR: NEGATIVE mg/dL
Leukocytes, UA: NEGATIVE
NITRITE: NEGATIVE
Protein, ur: 100 mg/dL — AB
SPECIFIC GRAVITY, URINE: 1.025 (ref 1.005–1.030)
Urobilinogen, UA: 0.2 mg/dL (ref 0.0–1.0)
pH: 6 (ref 5.0–8.0)

## 2014-10-24 LAB — CBC WITH DIFFERENTIAL/PLATELET
Basophils Absolute: 0 10*3/uL (ref 0.0–0.1)
Basophils Relative: 0 % (ref 0–1)
EOS ABS: 0.2 10*3/uL (ref 0.0–0.7)
Eosinophils Relative: 4 % (ref 0–5)
HCT: 40.9 % (ref 36.0–46.0)
Hemoglobin: 13.2 g/dL (ref 12.0–15.0)
LYMPHS ABS: 2.5 10*3/uL (ref 0.7–4.0)
Lymphocytes Relative: 39 % (ref 12–46)
MCH: 27.8 pg (ref 26.0–34.0)
MCHC: 32.3 g/dL (ref 30.0–36.0)
MCV: 86.1 fL (ref 78.0–100.0)
MONOS PCT: 6 % (ref 3–12)
Monocytes Absolute: 0.4 10*3/uL (ref 0.1–1.0)
Neutro Abs: 3.3 10*3/uL (ref 1.7–7.7)
Neutrophils Relative %: 51 % (ref 43–77)
PLATELETS: 136 10*3/uL — AB (ref 150–400)
RBC: 4.75 MIL/uL (ref 3.87–5.11)
RDW: 14.3 % (ref 11.5–15.5)
WBC: 6.3 10*3/uL (ref 4.0–10.5)

## 2014-10-24 MED ORDER — HYDROCODONE-ACETAMINOPHEN 5-325 MG PO TABS: 1.0000 | ORAL_TABLET | Freq: Four times a day (QID) | ORAL | Status: DC | PRN

## 2014-10-24 MED ORDER — SODIUM CHLORIDE 0.9 % IV SOLN
5.0000 mg/kg | Freq: Once | INTRAVENOUS | Status: AC
  Administered 2014-10-24: 600 mg via INTRAVENOUS
  Filled 2014-10-24: qty 24

## 2014-10-24 MED ORDER — SODIUM CHLORIDE 0.9 % IV SOLN: 5.0000 mg/kg | Freq: Once | INTRAVENOUS | Status: DC

## 2014-10-24 MED ORDER — SODIUM CHLORIDE 0.9 % IJ SOLN
10.0000 mL | Freq: Once | INTRAMUSCULAR | Status: AC
  Administered 2014-10-24: 10 mL via INTRAVENOUS

## 2014-10-24 MED ORDER — HEPARIN SOD (PORK) LOCK FLUSH 100 UNIT/ML IV SOLN
500.0000 [IU] | Freq: Once | INTRAVENOUS | Status: AC
  Administered 2014-10-24: 500 [IU] via INTRAVENOUS

## 2014-10-24 MED ORDER — SODIUM CHLORIDE 0.9 % IV SOLN
INTRAVENOUS | Status: DC
  Administered 2014-10-24: 11:00:00 via INTRAVENOUS

## 2014-10-24 MED ORDER — HEPARIN SOD (PORK) LOCK FLUSH 100 UNIT/ML IV SOLN
INTRAVENOUS | Status: AC
  Filled 2014-10-24: qty 5

## 2014-10-24 NOTE — Progress Notes (Signed)
[facility] Clinical Social Work  Clinical Social Work was referred by [facility] rounding for assessment of psychosocial needs.  Clinical Social Worker met with patient at [facility] during treatment to offer support and assess for needs.  Pt denies concerns currently and agrees to seek out CSW as needed. Pt has not been open to CSW support in the past and CSW will continue to try and develop rapport with pt.   Clinical Social Work interventions: Reassess needs     Loren Racer, [place] Tuesdays 8:30-1pm Wednesdays 8:30-12pm  [phone]

## 2014-10-24 NOTE — Progress Notes (Signed)
[facility]  OFFICE PROGRESS NOTE  [name], [place] [address]  DIAGNOSIS: Carcinoma of colon metastatic to multiple sites  Peripheral neuropathy  Essential hypertension  Chief Complaint  Patient presents with  . Colon Cancer    Maintenance Xeloda/Avastin since 09/2013    CURRENT THERAPY: Maintenance Xeloda/Avastin since November 2014 with Xeloda given at 1000 mg twice a day for 7 days on and 7 days off and Avastin given every 3 weeks at 5 mg/kg. Primary problem for the last 3 weeks is been bilateral wrist pain without swelling or redness. She has been taking aspirin plus hydrocodone. Bowel movements are regular with no diarrhea, hand-foot syndrome, cough, wheezing, PND, orthopnea, palpitations, headache, skin rash, sore throat, melena, hematochezia, hematuria, or vaginal bleeding.  INTERVAL HISTORY: Monica Allen 58 y.o. female returns for forfollowup of Stage IV colon cancer with liver and intra-abdominal metastases. Now on Avastin + Xeloda maintenance.   MEDICAL HISTORY: Past Medical History  Diagnosis Date  . Diabetes mellitus   . Hypertension   . Enlarged heart   . Goiter   . Nonischemic cardiomyopathy   . Anemia   . 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 Invasive adenocarcinoma of colon 03/08/2013    11/19 LNs POS, THRU TO ADIPOSE TISSUE    INTERIM HISTORY: has THYROMEGALY; DIABETES MELLITUS, UNCONTROLLED, WITH RENAL COMPLICATIONS; ANXIETY; DEPRESSION; HYPERTENSION; CARDIOMYOPATHY, DILATED; CONGESTIVE HEART FAILURE; SINUSITIS, CHRONIC; ALLERGIC RHINITIS; GERD; ONYCHIA AND PARONYCHIA OF TOE; LOW BACK PAIN; Diabetes mellitus; and Invasive adenocarcinoma of colon on her problem list.   Invasive adenocarcinoma of colon   01/02/2013 Initial Diagnosis Left ovary and fallopian tube is positive for invasive adenocarcinoma with LVI and positive pelvis biopsy    02/14/2013 Procedure Ascending  colon biopsy revealing invasive adenocarcinoma. KRAS wildtype   03/01/2013 Surgery Left segmental resection showing invasive adenocarcinoma through the muscularis propria into the pericolonoc fat involving the serosa with LVI. Clear margins. 11/19 positive lymph nodes for disease   04/04/2013 - 09/12/2013 Chemotherapy FOLFOX + Avastin x 12 cycles   09/26/2013 Imaging CT CAP- No definite signs of metastatic disease in the chest, abdomen or pelvis   10/02/2013 -  Chemotherapy Avastin + Xeloda maintenance. Avstin dose decreased to 7.5 mg/kg on 01/07/2014 due to poorly controlled HTN.   12/21/2013 Imaging CT CAP- Negative for recurrent or metastatic disease in the chest, abdomen or pelvis.    03/14/2014 Imaging CT CAP- No evidence of thoracic metastasis. No evidence metastasis within the abdomen or pelvis.   07/25/2014 Imaging CT CAP-he slight increase in mediastinal and peripancreatic lymph nodes with a new nodule in the right middle lobe most compatible with inflammatory processes.    ALLERGIES:  is allergic to actos and ibuprofen.  MEDICATIONS: has a current medication list which includes the following prescription(s): aspirin ec, capecitabine, carvedilol, fluticasone, furosemide, gabapentin, glipizide, hydrocodone-acetaminophen, lidocaine-prilocaine, linagliptin-metformin hcl, potassium chloride sa, ramipril, and valsartan.  SURGICAL HISTORY:  Past Surgical History  Procedure Laterality Date  . Wisdom tooth extraction    . Laparotomy  12/13/2012    Procedure: EXPLORATORY LAPAROTOMY;  Surgeon: Alvino Chapel, MD;  Location: [facility];  Service: Gynecology;  Laterality: N/A;  . Abdominal hysterectomy  12/13/2012    Procedure: HYSTERECTOMY ABDOMINAL;  Surgeon: Alvino Chapel, MD;  Location: [facility];  Service: Gynecology;  Laterality: N/A;  . Salpingoophorectomy  12/13/2012    Procedure: SALPINGO OOPHORECTOMY;  Surgeon: Alvino Chapel,  MD;  Location: [facility]  [facility];  Service: Gynecology;  Laterality: Bilateral;  . Omentectomy  12/13/2012    Procedure: OMENTECTOMY;  Surgeon: Alvino Chapel, MD;  Location: [facility];  Service: Gynecology;  Laterality: N/A;  . Colonoscopy N/A 02/14/2013    Procedure: COLONOSCOPY;  Surgeon: Danie Binder, MD;  Location: [facility];  Service: Endoscopy;  Laterality: N/A;  2:00 PM-moved to [place] notified pt  . Partial colectomy N/A 03/01/2013    Procedure: PARTIAL COLECTOMY;  Surgeon: Jamesetta So, MD;  Location: [facility];  Service: General;  Laterality: N/A;  . Portacath placement Left 03/01/2013    Procedure: INSERTION PORT-A-CATH;  Surgeon: Jamesetta So, MD;  Location: [facility];  Service: General;  Laterality: Left;    FAMILY HISTORY: family history includes Diabetes in her maternal grandmother, mother, and sister; Hypertension in her brother, maternal grandmother, mother, and sister; Stroke in her maternal aunt. There is no history of Colon cancer.  SOCIAL HISTORY:  reports that she quit smoking about 26 years ago. Her smoking use included Cigarettes. She has a 37.5 pack-year smoking history. She has never used smokeless tobacco. She reports that she drinks alcohol. She reports that she does not use illicit drugs.  REVIEW OF SYSTEMS:  Other than that discussed above is noncontributory.  PHYSICAL EXAMINATION: ECOG PERFORMANCE STATUS: 1 - Symptomatic but completely ambulatory  Last menstrual period 10/07/2012.  GENERAL:alert, no distress and comfortable SKIN: skin color, texture, turgor are normal, no rashes or significant lesions EYES: PERLA; Conjunctiva are pink and non-injected, sclera clear SINUSES: No redness or tenderness over maxillary or ethmoid sinuses OROPHARYNX:no exudate, no erythema on lips, buccal mucosa, or tongue. NECK: supple, thyroid normal size, non-tender, without nodularity. No masses CHEST: No breast masses. LYMPH:  no palpable lymphadenopathy in the cervical, axillary or  inguinal LUNGS: clear to auscultation and percussion with normal breathing effort HEART: regular rate & rhythm and no murmurs. ABDOMEN:abdomen soft, non-tender and normal bowel sounds. Obese with no organomegaly, ascites, or CVA tenderness.  MUSCULOSKELETAL:no cyanosis of digits and no clubbing. Range of motion normal.  NEURO: alert & oriented x 3 with fluent speech, no focal motor/sensory deficits   LABORATORY DATA: Infusion on 10/03/2014  Component Date Value Ref Range Status  . Specific Gravity, Urine 10/03/2014 >1.030* 1.005 - 1.030 Final  . pH 10/03/2014 6.0  5.0 - 8.0 Final  . Glucose, UA 10/03/2014 NEGATIVE  NEGATIVE mg/dL Final  . Hgb urine dipstick 10/03/2014 NEGATIVE  NEGATIVE Final  . Bilirubin Urine 10/03/2014 NEGATIVE  NEGATIVE Final  . Ketones, ur 10/03/2014 NEGATIVE  NEGATIVE mg/dL Final  . Protein, ur 10/03/2014 100* NEGATIVE mg/dL Final  . Urobilinogen, UA 10/03/2014 1.0  0.0 - 1.0 mg/dL Final  . Nitrite 10/03/2014 NEGATIVE  NEGATIVE Final  . Leukocytes, UA 10/03/2014 NEGATIVE  NEGATIVE Final  . CEA 10/03/2014 2.8  0.0 - 5.0 ng/mL Final   Performed at [facility]  . Sodium 10/03/2014 138  137 - 147 mEq/L Final  . Potassium 10/03/2014 3.9  3.7 - 5.3 mEq/L Final  . Chloride 10/03/2014 98  96 - 112 mEq/L Final  . CO2 10/03/2014 28  19 - 32 mEq/L Final  . Glucose, Bld 10/03/2014 218* 70 - 99 mg/dL Final  . BUN 10/03/2014 14  6 - 23 mg/dL Final  . Creatinine, Ser 10/03/2014 0.62  0.50 - 1.10 mg/dL Final  . Calcium 10/03/2014 9.1  8.4 - 10.5 mg/dL Final  . Total Protein 10/03/2014 7.0  6.0 -  8.3 g/dL Final  . Albumin 10/03/2014 3.3* 3.5 - 5.2 g/dL Final  . AST 10/03/2014 22  0 - 37 U/L Final  . ALT 10/03/2014 21  0 - 35 U/L Final  . Alkaline Phosphatase 10/03/2014 61  39 - 117 U/L Final  . Total Bilirubin 10/03/2014 0.4  0.3 - 1.2 mg/dL Final  . GFR calc non Af Amer 10/03/2014 >90  >90 mL/min Final  . GFR calc Af Amer 10/03/2014 >90  >90 mL/min Final    Comment: (NOTE) The eGFR has been calculated using the CKD EPI equation. This calculation has not been validated in all clinical situations. eGFR's persistently <90 mL/min signify possible Chronic Kidney Disease.   . Anion gap 10/03/2014 12  5 - 15 Final  . WBC 10/03/2014 7.3  4.0 - 10.5 K/uL Final  . RBC 10/03/2014 4.85  3.87 - 5.11 MIL/uL Final  . Hemoglobin 10/03/2014 13.7  12.0 - 15.0 g/dL Final  . HCT 10/03/2014 41.2  36.0 - 46.0 % Final  . MCV 10/03/2014 84.9  78.0 - 100.0 fL Final  . MCH 10/03/2014 28.2  26.0 - 34.0 pg Final  . MCHC 10/03/2014 33.3  30.0 - 36.0 g/dL Final  . RDW 10/03/2014 14.4  11.5 - 15.5 % Final  . Platelets 10/03/2014 162  150 - 400 K/uL Final  . Neutrophils Relative % 10/03/2014 52  43 - 77 % Final  . Neutro Abs 10/03/2014 3.8  1.7 - 7.7 K/uL Final  . Lymphocytes Relative 10/03/2014 39  12 - 46 % Final  . Lymphs Abs 10/03/2014 2.9  0.7 - 4.0 K/uL Final  . Monocytes Relative 10/03/2014 7  3 - 12 % Final  . Monocytes Absolute 10/03/2014 0.5  0.1 - 1.0 K/uL Final  . Eosinophils Relative 10/03/2014 2  0 - 5 % Final  . Eosinophils Absolute 10/03/2014 0.2  0.0 - 0.7 K/uL Final  . Basophils Relative 10/03/2014 0  0 - 1 % Final  . Basophils Absolute 10/03/2014 0.0  0.0 - 0.1 K/uL Final    PATK-ras wild-type adenocarcinoma breath disease([id number])  Urinalysis    Component Value Date/Time   COLORURINE YELLOW 10/26/2012 2121   APPEARANCEUR CLEAR 10/26/2012 2121   LABSPEC >1.030* 10/03/2014 [place] 6.0 10/03/2014 [place] 10/03/2014 [place] 10/03/2014 [place] 10/03/2014 [place] 10/03/2014 0947   PROTEINUR 100* 10/03/2014 0947   UROBILINOGEN 1.0 10/03/2014 0947   NITRITE NEGATIVE 10/03/2014 0947   LEUKOCYTESUR NEGATIVE 10/03/2014 0947    RADIOGRAPHIC STUDIES: No results found.  ASSESSMENT:  #1. Stage IV colon cancer with liver and intra-abdominal metastases, no evidence of  disease except for small retroperitoneal lymph nodes on most recent CT scan, status post 12 cycles of FOLFOX plus Avastin with resultant peripheral neuropathy. New nodule on CT scan of the long may be related to inflammatory process in addition to peripancreatic and mediastinal nodes. #2. Lower extremity neuropathy on gabapentin, stable.  #3. Hypertension better controlled now that costs of medication have been defrayed.  #4. Allergic rhinitis, asymptomatic. #5. Wrist pain probably arthritic.  PLAN: #1. Xeloda 1000 TWICE a day for 7 days on 7 days off, currently taking the medication. #2. Aleve 1 or 2 tablets every 12 hours for wrist discomfort along with hydrocodone. #3. Avastin intravenously today. #4. Restaging CT scans chest abdomen and pelvis with contrast on 11/07/2014 #5. Follow-up in 3 weeks for discussion of CT results and therapeutic  strategy.   All questions were answered. The patient knows to call the clinic with any problems, questions or concerns. We can certainly see the patient much sooner if necessary.   I spent 25 minutes counseling the patient face to face. The total time spent in the appointment was 30 minutes.    Doroteo Bradford, MD 10/24/2014 9:32 AM  DISCLAIMER:  This note was dictated with voice recognition software.  Similar sounding words can inadvertently be transcribed inaccurately and may not be corrected upon review.

## 2014-10-24 NOTE — Progress Notes (Signed)
Tolerated well

## 2014-10-24 NOTE — Patient Instructions (Signed)
[facility] Discharge Instructions for Patients Receiving Chemotherapy  Today you received the following chemotherapy agents Avastin.   BELOW ARE SYMPTOMS THAT SHOULD BE REPORTED IMMEDIATELY:  *FEVER GREATER THAN 101.0 F  *CHILLS WITH OR WITHOUT FEVER  NAUSEA AND VOMITING THAT IS NOT CONTROLLED WITH YOUR NAUSEA MEDICATION  *UNUSUAL SHORTNESS OF BREATH  *UNUSUAL BRUISING OR BLEEDING  TENDERNESS IN MOUTH AND THROAT WITH OR WITHOUT PRESENCE OF ULCERS  *URINARY PROBLEMS  *BOWEL PROBLEMS  UNUSUAL RASH Items with * indicate a potential emergency and should be followed up as soon as possible.  Return as scheduled.  I have been informed and understand all the instructions given to me. I know to contact the clinic, my physician, or go to the Emergency Department if any problems should occur. I do not have any questions at this time, but understand that I may call the clinic during office hours or the Patient Navigator at [phone] should I have any questions or need assistance in obtaining follow up care.    __________________________________________  _____________  __________ Signature of Patient or Authorized Representative            Date                   Time    __________________________________________ Nurse's Signature

## 2014-10-26 ENCOUNTER — Other Ambulatory Visit (HOSPITAL_COMMUNITY): Payer: Self-pay | Admitting: Oncology

## 2014-10-26 DIAGNOSIS — C189 Malignant neoplasm of colon, unspecified: Secondary | ICD-10-CM

## 2014-10-26 MED ORDER — CAPECITABINE 500 MG PO TABS: 1000.0000 mg | ORAL_TABLET | Freq: Two times a day (BID) | ORAL | Status: DC

## 2014-11-07 ENCOUNTER — Ambulatory Visit (HOSPITAL_COMMUNITY)
Admission: RE | Admit: 2014-11-07 | Discharge: 2014-11-07 | Disposition: A | Discharge status: Home / Self Care | Payer: BC Managed Care – PPO | Source: Ambulatory Visit | Attending: Hematology and Oncology | Admitting: Hematology and Oncology

## 2014-11-07 DIAGNOSIS — C778 Secondary and unspecified malignant neoplasm of lymph nodes of multiple regions: Secondary | ICD-10-CM | POA: Insufficient documentation

## 2014-11-07 DIAGNOSIS — C189 Malignant neoplasm of colon, unspecified: Secondary | ICD-10-CM | POA: Insufficient documentation

## 2014-11-07 DIAGNOSIS — K439 Ventral hernia without obstruction or gangrene: Secondary | ICD-10-CM | POA: Diagnosis not present

## 2014-11-07 DIAGNOSIS — Z90710 Acquired absence of both cervix and uterus: Secondary | ICD-10-CM | POA: Insufficient documentation

## 2014-11-07 DIAGNOSIS — R911 Solitary pulmonary nodule: Secondary | ICD-10-CM | POA: Diagnosis not present

## 2014-11-07 DIAGNOSIS — E042 Nontoxic multinodular goiter: Secondary | ICD-10-CM | POA: Diagnosis not present

## 2014-11-07 MED ORDER — IOHEXOL 300 MG/ML  SOLN
100.0000 mL | Freq: Once | INTRAMUSCULAR | Status: AC | PRN
  Administered 2014-11-07: 100 mL via INTRAVENOUS

## 2014-11-14 ENCOUNTER — Encounter (HOSPITAL_COMMUNITY): Payer: BLUE CROSS/BLUE SHIELD | Attending: Oncology | Admitting: Hematology & Oncology

## 2014-11-14 ENCOUNTER — Encounter (HOSPITAL_COMMUNITY): Payer: Self-pay | Admitting: Hematology & Oncology

## 2014-11-14 ENCOUNTER — Encounter (HOSPITAL_COMMUNITY): Payer: BLUE CROSS/BLUE SHIELD

## 2014-11-14 ENCOUNTER — Inpatient Hospital Stay (HOSPITAL_COMMUNITY): Payer: BC Managed Care – PPO

## 2014-11-14 VITALS — BP 167/86 | HR 64 | Temp 98.3°F | Resp 18 | Wt 266.3 lb

## 2014-11-14 DIAGNOSIS — I10 Essential (primary) hypertension: Secondary | ICD-10-CM

## 2014-11-14 DIAGNOSIS — D509 Iron deficiency anemia, unspecified: Secondary | ICD-10-CM

## 2014-11-14 DIAGNOSIS — E119 Type 2 diabetes mellitus without complications: Secondary | ICD-10-CM

## 2014-11-14 DIAGNOSIS — C189 Malignant neoplasm of colon, unspecified: Secondary | ICD-10-CM | POA: Insufficient documentation

## 2014-11-14 DIAGNOSIS — C182 Malignant neoplasm of ascending colon: Secondary | ICD-10-CM

## 2014-11-14 DIAGNOSIS — C787 Secondary malignant neoplasm of liver and intrahepatic bile duct: Secondary | ICD-10-CM

## 2014-11-14 NOTE — Patient Instructions (Signed)
[facility] Discharge Instructions  RECOMMENDATIONS MADE BY THE CONSULTANT AND ANY TEST RESULTS WILL BE SENT TO YOUR REFERRING PHYSICIAN.  We discussed a brief chemotherapy holiday. We will rescan you in 6 weeks to re-evaluate your cancer. If you need anything or have questions prior to follow-up please call We discussed new treatment options today  Thank you for choosing [facility] to provide your oncology and hematology care.  To afford each patient quality time with our providers, please arrive at least 15 minutes before your scheduled appointment time.  With your help, our goal is to use those 15 minutes to complete the necessary work-up to ensure our physicians have the information they need to help with your evaluation and healthcare recommendations.    Effective January 1st, 2014, we ask that you re-schedule your appointment with our physicians should you arrive 10 or more minutes late for your appointment.  We strive to give you quality time with our providers, and arriving late affects you and other patients whose appointments are after yours.    Again, thank you for choosing [facility].  Our hope is that these requests will decrease the amount of time that you wait before being seen by our physicians.       _____________________________________________________________  Should you have questions after your visit to [facility], please contact our office at [phone] between the hours of 8:30 a.m. and 5:00 p.m.  Voicemails left after 4:30 p.m. will not be returned until the following business day.  For prescription refill requests, have your pharmacy contact our office with your prescription refill request.

## 2014-11-14 NOTE — Progress Notes (Signed)
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 Aldean Ast on 12/13/2012. At that time he did TAH and BSO with lysis of adhesions, partial omentectomy, and resection of pelvic peritoneal implant. At the completion of the surgical procedure there is no evidence of residual disease. Her CEA is within the normal range. Her cancer however did not mark as ovarian in origin and a subsequent colonoscopy revealed a right ascending colon cancer. This was discovered by Dr. Trinda Pascal on 02/14/2013. It was near circumferential.   Kras Wildtype  CEA not elevated  CURRENT THERAPY:Maintenance Xeloda/Avastin since November 2014 with Xeloda given at 1000 mg twice a day for 7 days on and 7 days off and Avastin given every 3 weeks at 5 mg/kg.  INTERVAL HISTORY: Monica Allen 59 y.o. female returns for follow-up of her stage IV colon cancer. She just had staging studies she is here to review those as well. She reports she is overall doing fine. She is having some more fatigue which she attributes to her ongoing chemotherapy. She has no nausea no vomiting. she tolerates Xeloda very well. She denies hand-foot soreness or redness. She is able to do her ADLs as needed.  MEDICAL HISTORY: Past Medical History  Diagnosis Date  . Diabetes mellitus   . Hypertension   . Enlarged heart   . Goiter   . Nonischemic cardiomyopathy   . Anemia   . 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 Invasive adenocarcinoma of colon 03/08/2013    11/19 LNs POS, THRU TO ADIPOSE TISSUE    has THYROMEGALY; DIABETES MELLITUS, UNCONTROLLED, WITH RENAL COMPLICATIONS; ANXIETY; DEPRESSION; HYPERTENSION; CARDIOMYOPATHY, DILATED; CONGESTIVE HEART FAILURE; SINUSITIS, CHRONIC; ALLERGIC RHINITIS; GERD; ONYCHIA AND PARONYCHIA OF TOE; LOW BACK PAIN; Diabetes mellitus; and Invasive adenocarcinoma of colon  on her problem list.      Invasive adenocarcinoma of colon   01/02/2013 Initial Diagnosis Left ovary and fallopian tube is positive for invasive adenocarcinoma with LVI and positive pelvis biopsy    02/14/2013 Procedure Ascending colon biopsy revealing invasive adenocarcinoma.  KRAS wildtype   03/01/2013 Surgery Left segmental resection showing invasive adenocarcinoma through the muscularis propria into the pericolonoc fat involving the serosa with LVI.  Clear margins.  11/19 positive lymph nodes for disease   04/04/2013 - 09/12/2013 Chemotherapy FOLFOX + Avastin x 12 cycles   09/26/2013 Imaging CT CAP- No definite signs of metastatic disease in the chest, abdomen or pelvis   10/02/2013 -  Chemotherapy Avastin + Xeloda maintenance.  Avstin dose decreased to 7.5 mg/kg on 01/07/2014 due to poorly controlled HTN.   12/21/2013 Imaging CT CAP- Negative for recurrent or metastatic disease in the chest, abdomen or pelvis.    03/14/2014 Imaging CT CAP- No evidence of thoracic metastasis. No evidence metastasis within the abdomen or pelvis.   07/25/2014 Imaging CT CAP-he slight increase in mediastinal and peripancreatic lymph nodes with a new nodule in the right middle lobe most compatible with inflammatory processes.     is allergic to actos and ibuprofen.  Ms. Kho does not currently have medications on file.  SURGICAL HISTORY: Past Surgical History  Procedure Laterality Date  . Wisdom tooth extraction    . Laparotomy  12/13/2012    Procedure: EXPLORATORY LAPAROTOMY;  Surgeon: Alvino Chapel, MD;  Location: [facility];  Service: Gynecology;  Laterality: N/A;  . Abdominal hysterectomy  12/13/2012    Procedure: HYSTERECTOMY ABDOMINAL;  Surgeon: Alvino Chapel, MD;  Location: [facility];  Service: Gynecology;  Laterality: N/A;  . Salpingoophorectomy  12/13/2012    Procedure: SALPINGO OOPHORECTOMY;  Surgeon: Alvino Chapel, MD;  Location: [facility];  Service: Gynecology;  Laterality: Bilateral;  .  Omentectomy  12/13/2012    Procedure: OMENTECTOMY;  Surgeon: Alvino Chapel, MD;  Location: [facility];  Service: Gynecology;  Laterality: N/A;  . Colonoscopy N/A 02/14/2013    Procedure: COLONOSCOPY;  Surgeon: Danie Binder, MD;  Location: [facility];  Service: Endoscopy;  Laterality: N/A;  2:00 PM-moved to [place] notified pt  . Partial colectomy N/A 03/01/2013    Procedure: PARTIAL COLECTOMY;  Surgeon: Jamesetta So, MD;  Location: [facility];  Service: General;  Laterality: N/A;  . Portacath placement Left 03/01/2013    Procedure: INSERTION PORT-A-CATH;  Surgeon: Jamesetta So, MD;  Location: [facility];  Service: General;  Laterality: Left;    SOCIAL HISTORY: History   Social History  . Marital Status: Married    Spouse Name: N/A    Number of Children: N/A  . Years of Education: N/A   Occupational History  . self-employed     childcare provider   Social History Main Topics  . Smoking status: Former Smoker -- 1.50 packs/day for 25 years    Types: Cigarettes    Quit date: 12/10/1987  . Smokeless tobacco: Never Used     Comment: quit 22 years ago  . Alcohol Use: Yes     Comment: occasionally  . Drug Use: No  . Sexual Activity: Yes    Birth Control/ Protection: Surgical   Other Topics Concern  . Not on file   Social History Narrative    FAMILY HISTORY: Family History  Problem Relation Age of Onset  . Diabetes Mother   . Hypertension Mother   . Diabetes Sister   . Hypertension Sister   . Hypertension Brother   . Stroke Maternal Aunt   . Diabetes Maternal Grandmother   . Hypertension Maternal Grandmother   . Colon cancer Neg Hx     Review of Systems  Constitutional: Negative for fever, chills, weight loss and malaise/fatigue.  HENT: Negative for congestion, hearing loss, nosebleeds, sore throat and tinnitus.   Eyes: Negative for blurred vision, double vision, pain and discharge.  Respiratory: Negative for cough, hemoptysis, sputum production, shortness of  breath and wheezing.   Cardiovascular: Negative for chest pain, palpitations, claudication, leg swelling and PND.  Gastrointestinal: Negative for heartburn, nausea, vomiting, abdominal pain, diarrhea, constipation, blood in stool and melena.  Genitourinary: Negative for dysuria, urgency, frequency and hematuria.  Musculoskeletal: Negative for myalgias, joint pain and falls.  Skin: Negative for itching and rash.  Neurological: Negative for dizziness, tingling, tremors, sensory change, speech change, focal weakness, seizures, loss of consciousness, weakness and headaches.  Endo/Heme/Allergies: Does not bruise/bleed easily.  Psychiatric/Behavioral: Negative for depression, suicidal ideas, memory loss and substance abuse. The patient is not nervous/anxious and does not have insomnia.     PHYSICAL EXAMINATION  ECOG PERFORMANCE STATUS: 1 - Symptomatic but completely ambulatory  Filed Vitals:   11/14/14 0900  BP: 167/86  Pulse: 64  Temp: 98.3 F (36.8 C)  Resp: 18    Physical Exam  Constitutional: She is oriented to person, place, and time and well-developed, well-nourished, and in no distress.  Obese  HENT:  Head: Normocephalic and atraumatic.  Nose: Nose normal.  Mouth/Throat: Oropharynx is clear and  moist. No oropharyngeal exudate.  Eyes: Conjunctivae and EOM are normal. Pupils are equal, round, and reactive to light. Right eye exhibits no discharge. Left eye exhibits no discharge. No scleral icterus.  Neck: Normal range of motion. Neck supple. No tracheal deviation present. No thyromegaly present.  Cardiovascular: Normal rate, regular rhythm and normal heart sounds.  Exam reveals no gallop and no friction rub.   No murmur heard. Pulmonary/Chest: Effort normal and breath sounds normal. She has no wheezes. She has no rales.  Abdominal: Soft. Bowel sounds are normal. She exhibits no distension and no mass. There is no tenderness. There is no rebound and no guarding.  Musculoskeletal:  Normal range of motion. She exhibits no edema.  Lymphadenopathy:    She has no cervical adenopathy.  Neurological: She is alert and oriented to person, place, and time. She has normal reflexes. No cranial nerve deficit. Gait normal. Coordination normal.  Skin: Skin is warm and dry. No rash noted.  Psychiatric: Mood, memory, affect and judgment normal.  Nursing note and vitals reviewed.   LABORATORY DATA:  CBC    Component Value Date/Time   WBC 6.3 10/24/2014 0930   RBC 4.75 10/24/2014 0930   HGB 13.2 10/24/2014 0930   HCT 40.9 10/24/2014 0930   PLT 136* 10/24/2014 0930   MCV 86.1 10/24/2014 0930   MCH 27.8 10/24/2014 0930   MCHC 32.3 10/24/2014 0930   RDW 14.3 10/24/2014 0930   LYMPHSABS 2.5 10/24/2014 0930   MONOABS 0.4 10/24/2014 0930   EOSABS 0.2 10/24/2014 0930   BASOSABS 0.0 10/24/2014 0930   CMP     Component Value Date/Time   NA 138 10/03/2014 0951   K 3.9 10/03/2014 0951   CL 98 10/03/2014 0951   CO2 28 10/03/2014 0951   GLUCOSE 218* 10/03/2014 0951   BUN 14 10/03/2014 0951   CREATININE 0.62 10/03/2014 0951   CALCIUM 9.1 10/03/2014 0951   PROT 7.0 10/03/2014 0951   ALBUMIN 3.3* 10/03/2014 0951   AST 22 10/03/2014 0951   ALT 21 10/03/2014 0951   ALKPHOS 61 10/03/2014 0951   BILITOT 0.4 10/03/2014 0951   GFRNONAA >90 10/03/2014 0951   GFRAA >90 10/03/2014 0951     RADIOGRAPHIC STUDIES:  11/07/2014 EXAM: CT CHEST, ABDOMEN, AND PELVIS WITH CONTRAST IMPRESSION: Mildly increased lymphadenopathy within the chest and abdomen, consistent with metastatic disease.  Increased size of right middle lobe pulmonary nodule.  Stable multinodular goiter and large anterior abdominal wall hernia.   Electronically Signed  By: Myles Rosenthal M.D.  On: 11/07/2014 13:43     PATHOLOGY:  Colon, segmental resection for tumor, right - INVASIVE ADENOCARCINOMA INVADING THROUGH THE MUSCULARIS PROPRIA INTO PERICOLONIC FATTY TISSUE, INVOLVING THE SEROSA. -  ANGIOLYMPHATIC INVASION PRESENT. - ONE TUBULAR ADENOMA WITH NO EVIDENCE OF HIGH GRADE DYSPLASIA OR MALIGNANCY. - ELEVEN OF NINETEEN LYMPH NODES, POSITIVE FOR METASTATIC CARCINOMA WITH EXTRACAPSULAR EXTENSION (11/19). - RESECTION MARGINS CLEAR. - APPENDIX: BENIGN APPENDIX TISSUE, NO EVIDENCE OF ACTIVE INFLAMMATION OR NEOPLASM   Ovary and fallopian tube, left - MODERATELY DIFFERENTIATED ADENOCARCINOMA WITH ASSOCIATED EXTENSIVE TUMOR NECROSIS, NO EVIDENCE OF ANGIOLYMPHATIC INVASION IDENTIFIED. PLEASE SEE COMMENT FOR DETAILS. - BENIGN FALLOPIAN TUBAL TISSUE, NO EVIDENCE OF ENDOMETRIOSIS, ATYPIA OR MALIGNANCY. 2. Uterus, ovaries and fallopian tubes, left with cervix - LEIOMYOMATA. - ENDOMETRIUM: BENIGN ENDOMETRIAL POLYP AND ADJACENT BENIGN PROLIFERATIVE ENDOMETRIUM, NO ATYPIA, HYPERPLASIA OR MALIGNANCY. - UTERINE SEROSA: UNREMARKABLE. - LEFT OVARY: BENIGN OVARIAN TISSUE WITH STROMAL HYPERPLASIA, NO EVIDENCE OF ATYPIA OR MALIGNANCY. - LEFT FALLOPIAN TUBE:  BENIGN FALLOPIAN TUBAL TISSUE, NO EVIDENCE OF ENDOMETRIOSIS, ATYPIA OR MALIGNANCY. 3. Pelvis, biopsy - METASTATIC ADENOCARCINOMA WITH ASSOCIATED TUMOR NECROSIS. 4. Omentum, resection for tumor - MATURE ADIPOSE TISSUE WITH ASSOCIATED ACUTE INFLAMMATION AND HEMORRHAGE, NO EVIDENCE OF MALIGNANCY ASSESSMENT and THERAPY PLAN:    Invasive adenocarcinoma of colon Pleasant 59 year old female with stage IV colorectal cancer.Shee has been on maintenance Xeloda and Avastin for some time with overall stability of disease. Imaging suggests progression. SHe is having increased fatigue secondary to ongoing therapy. She does understand that her chemotherapy is palliative. She states she was advised to diagnosis that her average life expectancy would be 2 years. She has questions about that as she is still doing remarkably well.  I discussed with Sakara that she still has multiple treatment options. I reviewed the NCCN  guidelines in detail. She is K-ras  wild type and eligible for therapy with [employer]. I have advised her I think she can do well for some considerable time into the future. She questioned if she could take a small holiday. I have advised her I think that is reasonable and would plan on reimaging her in 2 months and seeing her back. There is clear evidence of additional tumor progression we can discuss other treatment options at that visit.     All questions were answered. The patient knows to call the clinic with any problems, questions or concerns. We can certainly see the patient much sooner if necessary.  Molli Hazard 11/24/2014

## 2014-11-24 NOTE — Assessment & Plan Note (Signed)
Pleasant 59 year old female with stage IV colorectal cancer.Shee has been on maintenance Xeloda and Avastin for some time with overall stability of disease. Imaging suggests progression. SHe is having increased fatigue secondary to ongoing therapy. She does understand that her chemotherapy is palliative. She states she was advised to diagnosis that her average life expectancy would be 2 years. She has questions about that as she is still doing remarkably well.  I discussed with Monica Allen that she still has multiple treatment options. I reviewed the NCCN  guidelines in detail. She is K-ras wild type and eligible for therapy with [employer]. I have advised her I think she can do well for some considerable time into the future. She questioned if she could take a small holiday. I have advised her I think that is reasonable and would plan on reimaging her in 2 months and seeing her back. There is clear evidence of additional tumor progression we can discuss other treatment options at that visit.

## 2014-12-23 ENCOUNTER — Encounter (HOSPITAL_COMMUNITY): Payer: Self-pay | Admitting: Emergency Medicine

## 2014-12-23 ENCOUNTER — Emergency Department (HOSPITAL_COMMUNITY): Payer: BLUE CROSS/BLUE SHIELD

## 2014-12-23 ENCOUNTER — Inpatient Hospital Stay (HOSPITAL_COMMUNITY)
Admission: EM | Admit: 2014-12-23 | Discharge: 2014-12-27 | DRG: 389 | Disposition: A | Discharge status: Home / Self Care | Payer: BLUE CROSS/BLUE SHIELD | Attending: Internal Medicine | Admitting: Internal Medicine

## 2014-12-23 DIAGNOSIS — K30 Functional dyspepsia: Secondary | ICD-10-CM | POA: Diagnosis not present

## 2014-12-23 DIAGNOSIS — E1165 Type 2 diabetes mellitus with hyperglycemia: Secondary | ICD-10-CM | POA: Diagnosis present

## 2014-12-23 DIAGNOSIS — G629 Polyneuropathy, unspecified: Secondary | ICD-10-CM | POA: Diagnosis present

## 2014-12-23 DIAGNOSIS — Z833 Family history of diabetes mellitus: Secondary | ICD-10-CM | POA: Diagnosis not present

## 2014-12-23 DIAGNOSIS — I10 Essential (primary) hypertension: Secondary | ICD-10-CM | POA: Diagnosis present

## 2014-12-23 DIAGNOSIS — C189 Malignant neoplasm of colon, unspecified: Secondary | ICD-10-CM | POA: Diagnosis present

## 2014-12-23 DIAGNOSIS — I429 Cardiomyopathy, unspecified: Secondary | ICD-10-CM | POA: Diagnosis present

## 2014-12-23 DIAGNOSIS — D509 Iron deficiency anemia, unspecified: Secondary | ICD-10-CM | POA: Diagnosis present

## 2014-12-23 DIAGNOSIS — Z9221 Personal history of antineoplastic chemotherapy: Secondary | ICD-10-CM | POA: Diagnosis not present

## 2014-12-23 DIAGNOSIS — I509 Heart failure, unspecified: Secondary | ICD-10-CM

## 2014-12-23 DIAGNOSIS — K56609 Unspecified intestinal obstruction, unspecified as to partial versus complete obstruction: Secondary | ICD-10-CM | POA: Diagnosis present

## 2014-12-23 DIAGNOSIS — Z7982 Long term (current) use of aspirin: Secondary | ICD-10-CM

## 2014-12-23 DIAGNOSIS — K5900 Constipation, unspecified: Secondary | ICD-10-CM | POA: Diagnosis present

## 2014-12-23 DIAGNOSIS — Z79891 Long term (current) use of opiate analgesic: Secondary | ICD-10-CM

## 2014-12-23 DIAGNOSIS — K5669 Other intestinal obstruction: Secondary | ICD-10-CM

## 2014-12-23 DIAGNOSIS — Z888 Allergy status to other drugs, medicaments and biological substances status: Secondary | ICD-10-CM | POA: Diagnosis not present

## 2014-12-23 DIAGNOSIS — E118 Type 2 diabetes mellitus with unspecified complications: Secondary | ICD-10-CM

## 2014-12-23 DIAGNOSIS — Z9049 Acquired absence of other specified parts of digestive tract: Secondary | ICD-10-CM | POA: Diagnosis present

## 2014-12-23 DIAGNOSIS — Z79899 Other long term (current) drug therapy: Secondary | ICD-10-CM

## 2014-12-23 DIAGNOSIS — I5032 Chronic diastolic (congestive) heart failure: Secondary | ICD-10-CM | POA: Diagnosis present

## 2014-12-23 DIAGNOSIS — Z85038 Personal history of other malignant neoplasm of large intestine: Secondary | ICD-10-CM

## 2014-12-23 DIAGNOSIS — Z87891 Personal history of nicotine dependence: Secondary | ICD-10-CM | POA: Diagnosis not present

## 2014-12-23 DIAGNOSIS — K5660 Unspecified intestinal obstruction: Secondary | ICD-10-CM | POA: Diagnosis present

## 2014-12-23 DIAGNOSIS — I5042 Chronic combined systolic (congestive) and diastolic (congestive) heart failure: Secondary | ICD-10-CM

## 2014-12-23 DIAGNOSIS — E119 Type 2 diabetes mellitus without complications: Secondary | ICD-10-CM

## 2014-12-23 HISTORY — DX: Unspecified abdominal hernia without obstruction or gangrene: K46.9

## 2014-12-23 LAB — CBC WITH DIFFERENTIAL/PLATELET
BASOS PCT: 0 % (ref 0–1)
Basophils Absolute: 0 10*3/uL (ref 0.0–0.1)
Eosinophils Absolute: 0.1 10*3/uL (ref 0.0–0.7)
Eosinophils Relative: 2 % (ref 0–5)
HEMATOCRIT: 45.7 % (ref 36.0–46.0)
HEMOGLOBIN: 15.3 g/dL — AB (ref 12.0–15.0)
Lymphocytes Relative: 29 % (ref 12–46)
Lymphs Abs: 2.5 10*3/uL (ref 0.7–4.0)
MCH: 27.7 pg (ref 26.0–34.0)
MCHC: 33.5 g/dL (ref 30.0–36.0)
MCV: 82.6 fL (ref 78.0–100.0)
Monocytes Absolute: 0.6 10*3/uL (ref 0.1–1.0)
Monocytes Relative: 7 % (ref 3–12)
Neutro Abs: 5.2 10*3/uL (ref 1.7–7.7)
Neutrophils Relative %: 62 % (ref 43–77)
Platelets: 190 10*3/uL (ref 150–400)
RBC: 5.53 MIL/uL — AB (ref 3.87–5.11)
RDW: 13.6 % (ref 11.5–15.5)
WBC: 8.4 10*3/uL (ref 4.0–10.5)

## 2014-12-23 LAB — COMPREHENSIVE METABOLIC PANEL
ALT: 18 U/L (ref 0–35)
ANION GAP: 12 (ref 5–15)
AST: 22 U/L (ref 0–37)
Albumin: 4.1 g/dL (ref 3.5–5.2)
Alkaline Phosphatase: 71 U/L (ref 39–117)
BUN: 9 mg/dL (ref 6–23)
CALCIUM: 9.1 mg/dL (ref 8.4–10.5)
CO2: 23 mmol/L (ref 19–32)
CREATININE: 0.74 mg/dL (ref 0.50–1.10)
Chloride: 101 mmol/L (ref 96–112)
GFR calc non Af Amer: >90 mL/min (ref >90)
Glucose, Bld: 330 mg/dL — ABNORMAL HIGH (ref 70–99)
POTASSIUM: 4.2 mmol/L (ref 3.5–5.1)
Sodium: 136 mmol/L (ref 135–145)
Total Bilirubin: 0.9 mg/dL (ref 0.3–1.2)
Total Protein: 7.4 g/dL (ref 6.0–8.3)

## 2014-12-23 LAB — URINE MICROSCOPIC-ADD ON

## 2014-12-23 LAB — URINALYSIS, ROUTINE W REFLEX MICROSCOPIC
Glucose, UA: 500 mg/dL — AB
Ketones, ur: 40 mg/dL — AB
LEUKOCYTES UA: NEGATIVE
Nitrite: NEGATIVE
PROTEIN: 100 mg/dL — AB
Specific Gravity, Urine: >1.03 — ABNORMAL HIGH (ref 1.005–1.030)
UROBILINOGEN UA: 0.2 mg/dL (ref 0.0–1.0)
pH: 6 (ref 5.0–8.0)

## 2014-12-23 LAB — I-STAT CG4 LACTIC ACID, ED: Lactic Acid, Venous: 2.38 mmol/L (ref 0.5–2.0)

## 2014-12-23 LAB — LIPASE, BLOOD: Lipase: 17 U/L (ref 11–59)

## 2014-12-23 MED ORDER — IOHEXOL 300 MG/ML  SOLN
50.0000 mL | Freq: Once | INTRAMUSCULAR | Status: AC | PRN
  Administered 2014-12-23: 50 mL via ORAL

## 2014-12-23 MED ORDER — HEPARIN SODIUM (PORCINE) 5000 UNIT/ML IJ SOLN
5000.0000 [IU] | Freq: Three times a day (TID) | INTRAMUSCULAR | Status: DC
  Administered 2014-12-23 – 2014-12-27 (×12): 5000 [IU] via SUBCUTANEOUS
  Filled 2014-12-23 (×12): qty 1

## 2014-12-23 MED ORDER — HYDROMORPHONE HCL 1 MG/ML IJ SOLN
1.0000 mg | Freq: Once | INTRAMUSCULAR | Status: AC
  Administered 2014-12-23: 1 mg via INTRAVENOUS
  Filled 2014-12-23: qty 1

## 2014-12-23 MED ORDER — MORPHINE SULFATE 2 MG/ML IJ SOLN
2.0000 mg | INTRAMUSCULAR | Status: DC | PRN
  Administered 2014-12-23 – 2014-12-25 (×5): 4 mg via INTRAVENOUS
  Filled 2014-12-23 (×5): qty 2

## 2014-12-23 MED ORDER — ONDANSETRON HCL 4 MG/2ML IJ SOLN
4.0000 mg | Freq: Four times a day (QID) | INTRAMUSCULAR | Status: DC | PRN
  Administered 2014-12-26: 4 mg via INTRAVENOUS
  Filled 2014-12-23: qty 2

## 2014-12-23 MED ORDER — FLUTICASONE PROPIONATE 50 MCG/ACT NA SUSP: 2.0000 | Freq: Every day | NASAL | Status: DC | PRN

## 2014-12-23 MED ORDER — ONDANSETRON HCL 4 MG/2ML IJ SOLN
4.0000 mg | Freq: Once | INTRAMUSCULAR | Status: AC
Start: 2014-12-23 — End: 2014-12-23
  Administered 2014-12-23: 4 mg via INTRAVENOUS
  Filled 2014-12-23: qty 2

## 2014-12-23 MED ORDER — FENTANYL CITRATE 0.05 MG/ML IJ SOLN
50.0000 ug | Freq: Once | INTRAMUSCULAR | Status: AC
  Administered 2014-12-23: 50 ug via INTRAVENOUS
  Filled 2014-12-23: qty 2

## 2014-12-23 MED ORDER — ONDANSETRON HCL 4 MG PO TABS: 4.0000 mg | ORAL_TABLET | Freq: Four times a day (QID) | ORAL | Status: DC | PRN

## 2014-12-23 MED ORDER — SODIUM CHLORIDE 0.9 % IV BOLUS (SEPSIS)
500.0000 mL | Freq: Once | INTRAVENOUS | Status: AC
  Administered 2014-12-23: 500 mL via INTRAVENOUS

## 2014-12-23 MED ORDER — SODIUM CHLORIDE 0.9 % IV SOLN
INTRAVENOUS | Status: DC
  Administered 2014-12-23 – 2014-12-26 (×5): via INTRAVENOUS

## 2014-12-23 MED ORDER — INSULIN ASPART 100 UNIT/ML ~~LOC~~ SOLN
0.0000 [IU] | Freq: Four times a day (QID) | SUBCUTANEOUS | Status: DC
  Administered 2014-12-24: 3 [IU] via SUBCUTANEOUS
  Administered 2014-12-24: 8 [IU] via SUBCUTANEOUS
  Administered 2014-12-24: 5 [IU] via SUBCUTANEOUS
  Administered 2014-12-24 – 2014-12-25 (×3): 3 [IU] via SUBCUTANEOUS
  Administered 2014-12-25: 5 [IU] via SUBCUTANEOUS
  Administered 2014-12-25: 2 [IU] via SUBCUTANEOUS
  Administered 2014-12-26 (×2): 3 [IU] via SUBCUTANEOUS
  Administered 2014-12-26: 5 [IU] via SUBCUTANEOUS
  Administered 2014-12-26: 3 [IU] via SUBCUTANEOUS

## 2014-12-23 NOTE — Consult Note (Addendum)
[place] for Consult:SBO Referring Physician: Dr. Frances Maywood is an 59 y.o. female.  HPI: This is an unfortunate female with stage IV colon cancer. She has been off Allen chemotherapy for approximately 5 weeks. She has a known ventral incisional hernia. Allen her last 4 days, she has been having increasing right-sided abdominal pain hurting also to the left. She has had an episode Allen nausea and vomiting. She now has a nasogastric tube in place and is pain-free. She had a bowel movement last night with the help Allen laxatives. She was transferred here from [facility] because Allen her multiple comorbidities and potential need for surgery. She denies chest pain or shortness Allen breath. She was due to have repeat CAT scans this week to determine whether or not she would be restarting chemotherapy.  Past Medical History  Diagnosis Date  . Diabetes mellitus   . Hypertension   . Enlarged heart   . Goiter   . Nonischemic cardiomyopathy   . Anemia   . 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 Invasive adenocarcinoma Allen colon 03/08/2013    11/19 LNs POS, THRU TO ADIPOSE TISSUE  . Abdominal hernia     Past Surgical History  Procedure Laterality Date  . Wisdom tooth extraction    . Laparotomy  12/13/2012    Procedure: EXPLORATORY LAPAROTOMY;  Surgeon: Alvino Chapel, MD;  Location: [facility];  Service: Gynecology;  Laterality: N/A;  . Abdominal hysterectomy  12/13/2012    Procedure: HYSTERECTOMY ABDOMINAL;  Surgeon: Alvino Chapel, MD;  Location: [facility];  Service: Gynecology;  Laterality: N/A;  . Salpingoophorectomy  12/13/2012    Procedure: SALPINGO OOPHORECTOMY;  Surgeon: Alvino Chapel, MD;  Location: [facility];  Service: Gynecology;  Laterality: Bilateral;  . Omentectomy  12/13/2012    Procedure: OMENTECTOMY;  Surgeon: Alvino Chapel, MD;  Location: [facility];  Service: Gynecology;  Laterality: N/A;  . Colonoscopy N/A 02/14/2013    Procedure: COLONOSCOPY;  Surgeon: Danie Binder, MD;  Location: [facility];  Service: Endoscopy;  Laterality: N/A;  2:00 PM-moved to [place] notified pt  . Partial colectomy N/A 03/01/2013    Procedure: PARTIAL COLECTOMY;  Surgeon: Jamesetta So, MD;  Location: [facility];  Service: General;  Laterality: N/A;  . Portacath placement Left 03/01/2013    Procedure: INSERTION PORT-A-CATH;  Surgeon: Jamesetta So, MD;  Location: [facility];  Service: General;  Laterality: Left;    Family History  Problem Relation Age Allen Onset  . Diabetes Mother   . Hypertension Mother   . Diabetes Sister   . Hypertension Sister   . Hypertension Brother   . Stroke Maternal Aunt   . Diabetes Maternal Grandmother   . Hypertension Maternal Grandmother   . Colon cancer Neg Hx     Social History:  reports that she quit smoking about 27 years ago. Her smoking use included Cigarettes. She has a 37.5 pack-year smoking history. She has never used smokeless tobacco. She reports that she does not drink alcohol or use illicit drugs.  Allergies:  Allergies  Allergen Reactions  . Actos [Pioglitazone] Other (See Comments)    Heart doctor told me not to take this drug.   . Ibuprofen Other (See Comments)    "made my stomach hurt"    Medications: I have reviewed the patient's current medications.  Results for orders placed or performed during the hospital encounter Allen 12/23/14 (from the past 48 hour(s))  CBC with Differential     Status: Abnormal   Collection Time: 12/23/14  1:55 PM  Result Value Ref Range   WBC 8.4 4.0 - 10.5 K/uL   RBC 5.53 (H) 3.87 - 5.11 MIL/uL   Hemoglobin 15.3 (H) 12.0 - 15.0 g/dL   HCT 45.7 36.0 - 46.0 %   MCV 82.6 78.0 - 100.0 fL   MCH 27.7 26.0 - 34.0 pg   MCHC 33.5 30.0 - 36.0 g/dL   RDW 13.6 11.5 - 15.5 %   Platelets 190 150 - 400 K/uL   Neutrophils Relative % 62 43 - 77 %   Neutro Abs 5.2 1.7 - 7.7 K/uL   Lymphocytes Relative 29 12 - 46 %   Lymphs Abs 2.5 0.7 - 4.0 K/uL   Monocytes Relative 7 3 - 12 %   Monocytes Absolute  0.6 0.1 - 1.0 K/uL   Eosinophils Relative 2 0 - 5 %   Eosinophils Absolute 0.1 0.0 - 0.7 K/uL   Basophils Relative 0 0 - 1 %   Basophils Absolute 0.0 0.0 - 0.1 K/uL  Comprehensive metabolic panel     Status: Abnormal   Collection Time: 12/23/14  1:55 PM  Result Value Ref Range   Sodium 136 135 - 145 mmol/L   Potassium 4.2 3.5 - 5.1 mmol/L   Chloride 101 96 - 112 mmol/L   CO2 23 19 - 32 mmol/L   Glucose, Bld 330 (H) 70 - 99 mg/dL   BUN 9 6 - 23 mg/dL   Creatinine, Ser 0.74 0.50 - 1.10 mg/dL   Calcium 9.1 8.4 - 10.5 mg/dL   Total Protein 7.4 6.0 - 8.3 g/dL   Albumin 4.1 3.5 - 5.2 g/dL   AST 22 0 - 37 U/L   ALT 18 0 - 35 U/L   Alkaline Phosphatase 71 39 - 117 U/L   Total Bilirubin 0.9 0.3 - 1.2 mg/dL   GFR calc non Af Amer >90 >90 mL/min   GFR calc Af Amer >90 >90 mL/min    Comment: (NOTE) The eGFR has been calculated using the CKD EPI equation. This calculation has not been validated in all clinical situations. eGFR's persistently <90 mL/min signify possible Chronic Kidney Disease.    Anion gap 12 5 - 15  Lipase, blood     Status: None   Collection Time: 12/23/14  1:55 PM  Result Value Ref Range   Lipase 17 11 - 59 U/L  I-Stat CG4 Lactic Acid, ED     Status: Abnormal   Collection Time: 12/23/14  2:13 PM  Result Value Ref Range   Lactic Acid, Venous 2.38 (HH) 0.5 - 2.0 mmol/L  Urinalysis, Routine w reflex microscopic     Status: Abnormal   Collection Time: 12/23/14  3:55 PM  Result Value Ref Range   Color, Urine YELLOW YELLOW   APPearance CLEAR CLEAR   Specific Gravity, Urine >1.030 (H) 1.005 - 1.030   pH 6.0 5.0 - 8.0   Glucose, UA 500 (A) NEGATIVE mg/dL   Hgb urine dipstick TRACE (A) NEGATIVE   Bilirubin Urine SMALL (A) NEGATIVE   Ketones, ur 40 (A) NEGATIVE mg/dL   Protein, ur 100 (A) NEGATIVE mg/dL   Urobilinogen, UA 0.2 0.0 - 1.0 mg/dL   Nitrite NEGATIVE NEGATIVE   Leukocytes, UA NEGATIVE NEGATIVE  Urine microscopic-add on     Status: Abnormal   Collection  Time: 12/23/14  3:55 PM  Result Value Ref Range   Squamous Epithelial / LPF MANY (  A) RARE   WBC, UA 3-6 <3 WBC/hpf   RBC / HPF 0-2 <3 RBC/hpf   Bacteria, UA FEW (A) RARE    Ct Abdomen Pelvis Wo Contrast  12/23/2014   CLINICAL DATA:  RIGHT-sided pain. Decreased stool. Nausea and vomiting. Colectomy. Colon cancer. Chemotherapy on going.  EXAM: CT ABDOMEN AND PELVIS WITHOUT CONTRAST  TECHNIQUE: Multidetector CT imaging Allen the abdomen and pelvis was performed following the standard protocol without IV contrast.  COMPARISON:  11/07/2014.  FINDINGS: Musculoskeletal: Lower lumbar facet arthrosis. No thoracolumbar compression fractures. There is a lesion in the medial LEFT femoral head which has gradually increased in size and lucency compared to 03/14/2014. Although this is nonspecific, this does have permeative osteolysis and could represent metastatic disease. This would be an atypical location for AVN but that is also in the differential considerations. A followup MRI Allen the bony pelvis with and without contrast is recommended in this patient with malignancy. Stable tiny lucency is present in the LEFT sacral ala at the S1 segment.  Lung Bases: Ground-glass attenuation is present in the RIGHT lower lobe, favored to represent atelectasis. Atypical infection is considered unlikely. No airspace consolidation. Previously seen RIGHT middle lobe pulmonary nodule is not visible.  Liver: Unenhanced CT was performed per clinician order. Lack Allen IV contrast limits sensitivity and specificity, especially for evaluation Allen abdominal/pelvic solid viscera. Grossly normal.  Spleen:  Normal.  Gallbladder: High attenuation in the gallbladder, probably representing cholelithiasis. This appears unchanged compared to the most recent CT.  Common bile duct:  Grossly normal.  Pancreas: The pancreas appears grossly normal. Lymph nodes are present around the pancreatic head both anteriorly and posteriorly. Although the is are suboptimally  evaluated in the absence Allen contrast, they appear similar to prior with anterior peripancreatic lymph node measuring 17 mm short axis and posterior lymph node measuring 20 mm short axis.  Adrenal glands: Unchanged LEFT adrenal nodule. RIGHT adrenal normal.  Kidneys: No stones or obstruction. Both ureters appear within normal limits.  Stomach:  Normal.  Distended with contrast.  Small bowel: Duodenum appears normal. There is progressive dilation Allen small bowel extending into the large ventral hernia. Small bowel obstruction is present with a transition point on image number 50, series 2 at the margin Allen the hernia. Dilated small bowel proximal to the transition point measures 35 mm. Distal small bowel is decompressed.  Colon: Ascending colectomy and ileocolonic anastomosis. No contrast within the colon.  Pelvic Genitourinary:  Hysterectomy.  Peritoneum: Small amount Allen free fluid, likely reactive to small bowel obstruction. Scarring in the RIGHT upper quadrant appears similar to prior. No free air.  Vasculature: Atherosclerosis.  No gross acute abnormality.  Body Wall: Large ventral incisional hernia.  IMPRESSION: 1. Small bowel obstruction with transition point in the RIGHT-sided margin Allen the large ventral hernia. Obstruction appears high-grade. 2. Ascending colectomy. 3. Stable peripancreatic lymph nodes. 4. Gallstones or sludge.  No change from prior. 5. Radiolucent lesion in the medial LEFT femoral head has been slowly increasing over time. Metastatic disease is in the differential considerations. Follow-up nonemergent MRI Allen the pelvis with and without contrast is recommended for further assessment.   Electronically Signed   By: Andreas Newport M.D.   On: 12/23/2014 16:21    Review Allen Systems  All other systems reviewed and are negative.  Blood pressure 109/70, pulse 91, temperature 97.7 F (36.5 C), temperature source Oral, resp. rate 15, height 5\' 3"  (1.6 m), weight 269 lb (122.018 kg), last menstrual  period 10/07/2012, SpO2 99 %. Physical Exam  Constitutional: She is oriented to person, place, and time. She appears well-developed and well-nourished. No distress.  Obese  HENT:  Head: Normocephalic and atraumatic.  Right Ear: External ear normal.  Left Ear: External ear normal.  Nose: Nose normal.  Mouth/Throat: Oropharynx is clear and moist. No oropharyngeal exudate.  Eyes: Conjunctivae are normal. Pupils are equal, round, and reactive to light. Right eye exhibits no discharge. Left eye exhibits no discharge. No scleral icterus.  Neck: Normal range Allen motion. No tracheal deviation present.  Cardiovascular: Normal rate, regular rhythm, normal heart sounds and intact distal pulses.   No murmur heard. Respiratory: Effort normal and breath sounds normal. No respiratory distress. She has no wheezes.  GI: Soft. She exhibits no distension.  She has a chronically incarcerated incisional hernia on the right side Allen the abdomen. There is minimal tenderness at the hernia and around the rest Allen the abdomen  Musculoskeletal: Normal range Allen motion. She exhibits no edema or tenderness.  Lymphadenopathy:    She has no cervical adenopathy.  Neurological: She is alert and oriented to person, place, and time.  Skin: Skin is warm. No rash noted. No erythema.  Psychiatric: Her behavior is normal. Judgment normal.    Assessment/Plan: Chronically incarcerated ventral incisional hernia with small bowel obstruction  This is an unfortunate situation in a patient with stage IV disease. According to her oncologist, she is still a candidate for further chemotherapy. Hopefully, the obstruction will subside with conservative management including bowel rest and NG decompression. We will repeat her abdominal x-rays in the morning. If she does not improve, she will require hernia repair.  The [facility] hospitalist will be admitting the patient secondary to her multiple medical problems and diabetes. Her current blood  sugar is over 300.  Veronnica Hennings A 12/23/2014, 8:24 PM

## 2014-12-23 NOTE — ED Provider Notes (Signed)
  Physical Exam  BP 135/85 mmHg  Pulse 88  Temp(Src) 97.6 F (36.4 C) (Oral)  Resp 20  Ht 5\' 3"  (1.6 m)  Wt 268 lb (121.564 kg)  BMI 47.49 kg/m2  SpO2 100%  LMP 10/07/2012  Physical Exam  ED Course  Procedures  MDM Patient transferred from [facility] for high grade SBO. I discussed with Dr. Ninfa Linden, who recommend NG tube and medical admission. He will come by as a Optometrist. Will admit.   Wandra Arthurs, MD 12/23/14 [phone]

## 2014-12-23 NOTE — H&P (Signed)
[facility] History and Physical  Patient: Monica Allen  MRN: [id number]  DOB: 1956-04-25  DOS: the patient was seen and examined on 12/23/2014 PCP: [name]  Chief Complaint: Abdominal pain  HPI: Monica Allen is a 59 y.o. female with Past medical history of invasive adenocarcinoma of the colon on status post partial colectomy and chemotherapy, history of cardiomyopathy last EF 55% 2009, diabetes mellitus, hypertension. The patient is presenting with complaints of abdominal pain associated with nausea and vomiting that has been ongoing for last 2-3 days. The pain has been gradually worsening and not improving. Patient did have constipation and she took a stool softener and had a bowel movement last night this was followed by one or 2 loose bowel motion as well. Patient denies any blood or black color bowel movement. Asian at the time of my evaluation does not have any nausea. Denies any fever, chills, cough, chest pain, shortness of breath. Denies any changes in her medication but since last 2 days has not been taking her blood pressure medication as well as diuretic due to ongoing abdominal pain with poor oral intake. She is also reporting her diabetes medication. Denies any rash anywhere. The patient is not on Xeloda at present for last 5 weeks.  The patient is coming from home. And at her baseline independent for most of her ADL.  Review of Systems: as mentioned in the history of present illness.  A Comprehensive review of the other systems is negative.  Past Medical History  Diagnosis Date  . Diabetes mellitus   . Hypertension   . Enlarged heart   . Goiter   . Nonischemic cardiomyopathy   . Anemia   . 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 Invasive adenocarcinoma of colon 03/08/2013    11/19 LNs POS, THRU TO ADIPOSE TISSUE  . Abdominal hernia    Past Surgical History  Procedure Laterality Date  . Wisdom tooth extraction    . Laparotomy  12/13/2012     Procedure: EXPLORATORY LAPAROTOMY;  Surgeon: Alvino Chapel, MD;  Location: [facility];  Service: Gynecology;  Laterality: N/A;  . Abdominal hysterectomy  12/13/2012    Procedure: HYSTERECTOMY ABDOMINAL;  Surgeon: Alvino Chapel, MD;  Location: [facility];  Service: Gynecology;  Laterality: N/A;  . Salpingoophorectomy  12/13/2012    Procedure: SALPINGO OOPHORECTOMY;  Surgeon: Alvino Chapel, MD;  Location: [facility];  Service: Gynecology;  Laterality: Bilateral;  . Omentectomy  12/13/2012    Procedure: OMENTECTOMY;  Surgeon: Alvino Chapel, MD;  Location: [facility];  Service: Gynecology;  Laterality: N/A;  . Colonoscopy N/A 02/14/2013    Procedure: COLONOSCOPY;  Surgeon: Danie Binder, MD;  Location: [facility];  Service: Endoscopy;  Laterality: N/A;  2:00 PM-moved to [place] notified pt  . Partial colectomy N/A 03/01/2013    Procedure: PARTIAL COLECTOMY;  Surgeon: Jamesetta So, MD;  Location: [facility];  Service: General;  Laterality: N/A;  . Portacath placement Left 03/01/2013    Procedure: INSERTION PORT-A-CATH;  Surgeon: Jamesetta So, MD;  Location: [facility];  Service: General;  Laterality: Left;   Social History:  reports that she quit smoking about 27 years ago. Her smoking use included Cigarettes. She has a 37.5 pack-year smoking history. She has never used smokeless tobacco. She reports that she does not drink alcohol or use illicit drugs.  Allergies  Allergen Reactions  . Actos [Pioglitazone] Other (See Comments)    Heart doctor told  me not to take this drug.   . Ibuprofen Other (See Comments)    "made my stomach hurt"    Family History  Problem Relation Age of Onset  . Diabetes Mother   . Hypertension Mother   . Diabetes Sister   . Hypertension Sister   . Hypertension Brother   . Stroke Maternal Aunt   . Diabetes Maternal Grandmother   . Hypertension Maternal Grandmother   . Colon cancer Neg Hx     Prior to Admission medications   Medication Sig Start  Date End Date Taking? Authorizing Provider  aspirin EC 81 MG tablet Take 81 mg by mouth every morning.    Yes Historical Provider, MD  carvedilol (COREG CR) 80 MG 24 hr capsule Take 80 mg by mouth daily with breakfast.    Yes Historical Provider, MD  gabapentin (NEURONTIN) 300 MG capsule Take 2 capsule in the morning, 1 capsule midday, and 3 capsules at bedtime daily. Patient taking differently: Take 300-900 mg by mouth 3 (three) times daily. Take 2 capsules in the morning, 1 capsule midday, and  3 capsules at bedtime. 10/03/14  Yes Baird Cancer, PA-C  lidocaine-prilocaine (EMLA) cream Apply a quarter size amount to port site 1 hour prior to chemo. Do not rub in. Cover with plastic. Patient taking differently: Apply 1 application topically daily. Apply a quarter size amount to port site 1 hour prior to chemo. Do not rub in. Cover with plastic. 05/28/14  Yes Baird Cancer, PA-C  magnesium citrate SOLN Take 1 Bottle by mouth daily as needed for moderate constipation or severe constipation.   Yes Historical Provider, MD  valsartan (DIOVAN) 320 MG tablet Take 320 mg by mouth daily.   Yes Historical Provider, MD  capecitabine (XELODA) 500 MG tablet Take 2 tablets (1,000 mg total) by mouth 2 (two) times daily after a meal. For 7 days on, 7 days off Patient not taking: Reported on 12/23/2014 10/26/14   Manon Hilding Kefalas, PA-C  fluticasone (FLONASE) 50 MCG/ACT nasal spray 2 puffs in each nostril at bedtime. Patient taking differently: Place 2 sprays into both nostrils daily as needed for allergies. 2 puffs in each nostril at bedtime. 07/11/14   Farrel Gobble, MD  furosemide (LASIX) 80 MG tablet Take 80 mg by mouth daily with breakfast.     Historical Provider, MD  glipiZIDE (GLUCOTROL) 10 MG tablet Take 10 mg by mouth daily before breakfast.  08/28/13   Farrel Gobble, MD  HYDROcodone-acetaminophen (NORCO/VICODIN) 5-325 MG per tablet Take 1 tablet by mouth every 6 (six) hours as needed for moderate  pain. Patient not taking: Reported on 12/23/2014 10/24/14   Farrel Gobble, MD  Linagliptin-Metformin HCl (JENTADUETO) 2.03-999 MG TABS Take 1 tablet by mouth 2 (two) times daily.     Historical Provider, MD  potassium chloride (KLOR-CON) 20 MEQ packet Take 20 mEq by mouth daily.    Historical Provider, MD    Physical Exam: Filed Vitals:   12/23/14 1915 12/23/14 1930 12/23/14 1945 12/23/14 2101  BP: 133/91 116/71 109/70 132/81  Pulse: 101 95 91 87  Temp:    98.1 F (36.7 C)  TempSrc:    Oral  Resp: 23 20 15 14   Height:    5\' 3"  (1.6 m)  Weight:    114.896 kg (253 lb 4.8 oz)  SpO2: 98% 98% 99% 96%    General: Alert, Awake and Oriented to Time, Place and Person. Appear in mild distress Eyes: PERRL ENT: Oral Mucosa clear moist.  NG tube inserted Neck: no JVD Cardiovascular: S1 and S2 Present, no Murmur, Peripheral Pulses Present Respiratory: Bilateral Air entry equal and Decreased, Clear to Auscultation, noCrackles, no wheezes Abdomen: Bowel Sound sluggish but present, Soft and diffusely tender Skin: no Rash Extremities: Trace Pedal edema, no calf tenderness Neurologic: Grossly no focal neuro deficit.  Labs on Admission:  CBC:  Recent Labs Lab 12/23/14 1355  WBC 8.4  NEUTROABS 5.2  HGB 15.3*  HCT 45.7  MCV 82.6  PLT 190    CMP     Component Value Date/Time   NA 136 12/23/2014 1355   K 4.2 12/23/2014 1355   CL 101 12/23/2014 1355   CO2 23 12/23/2014 1355   GLUCOSE 330* 12/23/2014 1355   BUN 9 12/23/2014 1355   CREATININE 0.74 12/23/2014 1355   CALCIUM 9.1 12/23/2014 1355   PROT 7.4 12/23/2014 1355   ALBUMIN 4.1 12/23/2014 1355   AST 22 12/23/2014 1355   ALT 18 12/23/2014 1355   ALKPHOS 71 12/23/2014 1355   BILITOT 0.9 12/23/2014 1355   GFRNONAA >90 12/23/2014 1355   GFRAA >90 12/23/2014 1355     Recent Labs Lab 12/23/14 1355  LIPASE 17    No results for input(s): CKTOTAL, CKMB, CKMBINDEX, TROPONINI in the last 168 hours. BNP (last 3 results) No  results for input(s): BNP in the last 8760 hours.  ProBNP (last 3 results) No results for input(s): PROBNP in the last 8760 hours.   Radiological Exams on Admission: Ct Abdomen Pelvis Wo Contrast  12/23/2014   CLINICAL DATA:  RIGHT-sided pain. Decreased stool. Nausea and vomiting. Colectomy. Colon cancer. Chemotherapy on going.  EXAM: CT ABDOMEN AND PELVIS WITHOUT CONTRAST  TECHNIQUE: Multidetector CT imaging of the abdomen and pelvis was performed following the standard protocol without IV contrast.  COMPARISON:  11/07/2014.  FINDINGS: Musculoskeletal: Lower lumbar facet arthrosis. No thoracolumbar compression fractures. There is a lesion in the medial LEFT femoral head which has gradually increased in size and lucency compared to 03/14/2014. Although this is nonspecific, this does have permeative osteolysis and could represent metastatic disease. This would be an atypical location for AVN but that is also in the differential considerations. A followup MRI of the bony pelvis with and without contrast is recommended in this patient with malignancy. Stable tiny lucency is present in the LEFT sacral ala at the S1 segment.  Lung Bases: Ground-glass attenuation is present in the RIGHT lower lobe, favored to represent atelectasis. Atypical infection is considered unlikely. No airspace consolidation. Previously seen RIGHT middle lobe pulmonary nodule is not visible.  Liver: Unenhanced CT was performed per clinician order. Lack of IV contrast limits sensitivity and specificity, especially for evaluation of abdominal/pelvic solid viscera. Grossly normal.  Spleen:  Normal.  Gallbladder: High attenuation in the gallbladder, probably representing cholelithiasis. This appears unchanged compared to the most recent CT.  Common bile duct:  Grossly normal.  Pancreas: The pancreas appears grossly normal. Lymph nodes are present around the pancreatic head both anteriorly and posteriorly. Although the is are suboptimally  evaluated in the absence of contrast, they appear similar to prior with anterior peripancreatic lymph node measuring 17 mm short axis and posterior lymph node measuring 20 mm short axis.  Adrenal glands: Unchanged LEFT adrenal nodule. RIGHT adrenal normal.  Kidneys: No stones or obstruction. Both ureters appear within normal limits.  Stomach:  Normal.  Distended with contrast.  Small bowel: Duodenum appears normal. There is progressive dilation of small bowel extending into the large ventral hernia.  Small bowel obstruction is present with a transition point on image number 50, series 2 at the margin of the hernia. Dilated small bowel proximal to the transition point measures 35 mm. Distal small bowel is decompressed.  Colon: Ascending colectomy and ileocolonic anastomosis. No contrast within the colon.  Pelvic Genitourinary:  Hysterectomy.  Peritoneum: Small amount of free fluid, likely reactive to small bowel obstruction. Scarring in the RIGHT upper quadrant appears similar to prior. No free air.  Vasculature: Atherosclerosis.  No gross acute abnormality.  Body Wall: Large ventral incisional hernia.  IMPRESSION: 1. Small bowel obstruction with transition point in the RIGHT-sided margin of the large ventral hernia. Obstruction appears high-grade. 2. Ascending colectomy. 3. Stable peripancreatic lymph nodes. 4. Gallstones or sludge.  No change from prior. 5. Radiolucent lesion in the medial LEFT femoral head has been slowly increasing over time. Metastatic disease is in the differential considerations. Follow-up nonemergent MRI of the pelvis with and without contrast is recommended for further assessment.   Electronically Signed   By: Dereck Ligas M.D.   On: 12/23/2014 16:21    Assessment/Plan Principal Problem:   SBO (small bowel obstruction) Active Problems:   Invasive adenocarcinoma of colon   Type 2 diabetes mellitus   Essential hypertension   Chronic CHF (congestive heart failure)   1. SBO (small  bowel obstruction) The patient is presenting with complaints of abdominal pain associated with nausea and vomiting. She has not had any gas passing since morning. She had last bowel movement last night. A CT of the abdomen is confirming small bowel obstruction. General surgery was consulted who currently recommends conservative management. Patient only has an NG tube inserted and will continue her on low intermittent suction. Gentle IV hydration in view of her history of CHF. When necessary Zofran when necessary morphine for pain.  2. Diabetes mellitus. Continuing her on sliding scale and holding oral hypoglycemic agents.  3. Essential hypertension. Patient does have history of essential hypertension and is on multiple antihypertensive medications. Currently her blood pressure is stable therefore in view of her being nothing by mouth I'll be holding her antihypertensive medication and monitor her closely.  4. History of chronic diastolic heart failure, history of nonischemic cardiomyopathy. Patient is on high-dose Lasix. Does not appear volume overloaded at present. Last EF 55% 2009 in the system. Gentle IV hydration in view of ongoing small bowel obstruction.  Advance goals of care discussion: DNR/DNI as per my discussion with patient.   Consults: Gen. surgery  DVT Prophylaxis: subcutaneous Heparin Nutrition: Nothing by mouth  Family Communication: Family was present at bedside, opportunity was given to ask question and all questions were answered satisfactorily at the time of interview. Disposition: Admitted to inpatient in med-surge unit.  Author: Berle Mull, MD [facility] Pager: [phone] 12/23/2014, 9:38 PM    If 7PM-7AM, please contact night-coverage [email or web address] Password [email or web address]

## 2014-12-23 NOTE — ED Notes (Addendum)
Pt reports abdominal pain,n/v/d for last several days. Pt reports has colon cancer and recently had a colon resection. Pt reports is in between rounds of chemo.

## 2014-12-23 NOTE — ED Notes (Signed)
Transferred from [facility] for small bowel obstruction and hiatal hernia. Prior to arrival patient given fentanyl 50 mcg x3 and dilaudid 1 mg. Pain decreased from 5/10 to 0/10. Alert answering and following commands appropriate.

## 2014-12-23 NOTE — ED Provider Notes (Signed)
CSN: [id number]     Arrival date & time 12/23/14  1319 History  This chart was scribed for Carmin Muskrat, MD by Zola Button, ED Scribe. This patient was seen in room APA08/APA08 and the patient's care was started at 1:41 PM.       Chief Complaint  Patient presents with  . Abdominal Pain   The history is provided by the patient. No language interpreter was used.   HPI Comments: ADALYNE LOVICK is a 59 y.o. female with a hx of colon cancer, DM, HTN and cardiomegaly who presents to the Emergency Department complaining of gradual onset, pressurizing abdominal pain that started 4 days ago. The pain was initially on her right side, but it is now on the left side of her abdomen. Patient also reports having constipation and vomiting, and she states she has been eating less. She has taken laxatives for her constipation, but she has not taken any medications for her pain. Her pain did improve some after vomiting today. She has had difficulty sleeping due to the pain. Her last bowel movement was last night. Patient denies fever, confusion, syncope, chest pain, and difficulty breathing. She also denies any gas today. She notes allergies to Actos and ibuprofen.  Patient has had a colon resection done about 18 months ago for her colon cancer. Her last chemotherapy session was about 5 weeks ago, and maintenance therapy was stopped then. 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 She has not undergone any radiation treatments. Patient is out of the hydrocodone that had been prescribed by her oncologist.  PCP: Larene Pickett  Past Medical History  Diagnosis Date  . Diabetes mellitus   . Hypertension   . Enlarged heart   . Goiter   . Nonischemic cardiomyopathy   . Anemia   . 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 Invasive adenocarcinoma of colon 03/08/2013    11/19 LNs POS, THRU TO ADIPOSE TISSUE  . Abdominal hernia    Past Surgical History  Procedure Laterality Date  . Wisdom tooth extraction     . Laparotomy  12/13/2012    Procedure: EXPLORATORY LAPAROTOMY;  Surgeon: Alvino Chapel, MD;  Location: [facility];  Service: Gynecology;  Laterality: N/A;  . Abdominal hysterectomy  12/13/2012    Procedure: HYSTERECTOMY ABDOMINAL;  Surgeon: Alvino Chapel, MD;  Location: [facility];  Service: Gynecology;  Laterality: N/A;  . Salpingoophorectomy  12/13/2012    Procedure: SALPINGO OOPHORECTOMY;  Surgeon: Alvino Chapel, MD;  Location: [facility];  Service: Gynecology;  Laterality: Bilateral;  . Omentectomy  12/13/2012    Procedure: OMENTECTOMY;  Surgeon: Alvino Chapel, MD;  Location: [facility];  Service: Gynecology;  Laterality: N/A;  . Colonoscopy N/A 02/14/2013    Procedure: COLONOSCOPY;  Surgeon: Danie Binder, MD;  Location: [facility];  Service: Endoscopy;  Laterality: N/A;  2:00 PM-moved to [place] notified pt  . Partial colectomy N/A 03/01/2013    Procedure: PARTIAL COLECTOMY;  Surgeon: Jamesetta So, MD;  Location: [facility];  Service: General;  Laterality: N/A;  . Portacath placement Left 03/01/2013    Procedure: INSERTION PORT-A-CATH;  Surgeon: Jamesetta So, MD;  Location: [facility];  Service: General;  Laterality: Left;   Family History  Problem Relation Age of Onset  . Diabetes Mother   . Hypertension Mother   . Diabetes Sister   . Hypertension Sister   . Hypertension Brother   . Stroke Maternal Aunt   .  Diabetes Maternal Grandmother   . Hypertension Maternal Grandmother   . Colon cancer Neg Hx    History  Substance Use Topics  . Smoking status: Former Smoker -- 1.50 packs/day for 25 years    Types: Cigarettes    Quit date: 12/10/1987  . Smokeless tobacco: Never Used     Comment: quit 22 years ago  . Alcohol Use: No     Comment: last use x1 month.   OB History    No data available     Review of Systems  Constitutional: Negative for fever.       Per HPI, otherwise negative  HENT:       Per HPI, otherwise negative  Respiratory: Negative for  shortness of breath.        Per HPI, otherwise negative  Cardiovascular: Negative for chest pain.       Per HPI, otherwise negative  Gastrointestinal: Positive for vomiting, abdominal pain and constipation.  Endocrine:       Negative aside from HPI  Genitourinary:       Neg aside from HPI   Musculoskeletal:       Per HPI, otherwise negative  Skin: Negative.   Neurological: Negative for syncope.  Psychiatric/Behavioral: Negative for confusion.      Allergies  Actos and Ibuprofen  Home Medications   Prior to Admission medications   Medication Sig Start Date End Date Taking? Authorizing Provider  aspirin EC 81 MG tablet Take 81 mg by mouth every morning.    Yes Historical Provider, MD  carvedilol (COREG CR) 80 MG 24 hr capsule Take 80 mg by mouth daily with breakfast.    Yes Historical Provider, MD  furosemide (LASIX) 80 MG tablet Take 80 mg by mouth daily with breakfast.    Yes Historical Provider, MD  gabapentin (NEURONTIN) 300 MG capsule Take 2 capsule in the morning, 1 capsule midday, and 3 capsules at bedtime daily. Patient taking differently: Take 300-900 mg by mouth 3 (three) times daily. Take 2 capsules in the morning, 1 capsule midday, and  3 capsules at bedtime. 10/03/14  Yes Manon Hilding Kefalas, PA-C  glipiZIDE (GLUCOTROL) 10 MG tablet Take 10 mg by mouth daily before breakfast.  08/28/13  Yes Farrel Gobble, MD  lidocaine-prilocaine (EMLA) cream Apply a quarter size amount to port site 1 hour prior to chemo. Do not rub in. Cover with plastic. Patient taking differently: Apply 1 application topically daily. Apply a quarter size amount to port site 1 hour prior to chemo. Do not rub in. Cover with plastic. 05/28/14  Yes Baird Cancer, PA-C  Linagliptin-Metformin HCl (JENTADUETO) 2.03-999 MG TABS Take 1 tablet by mouth 2 (two) times daily.    Yes Historical Provider, MD  magnesium citrate SOLN Take 1 Bottle by mouth daily as needed for moderate constipation or severe  constipation.   Yes Historical Provider, MD  potassium chloride (KLOR-CON) 20 MEQ packet Take 20 mEq by mouth daily.   Yes Historical Provider, MD  ramipril (ALTACE) 10 MG capsule Take 1 capsule (10 mg total) by mouth daily. 09/25/14  Yes Manon Hilding Kefalas, PA-C  valsartan (DIOVAN) 320 MG tablet Take 320 mg by mouth daily.   Yes Historical Provider, MD  capecitabine (XELODA) 500 MG tablet Take 2 tablets (1,000 mg total) by mouth 2 (two) times daily after a meal. For 7 days on, 7 days off Patient not taking: Reported on 12/23/2014 10/26/14   Manon Hilding Kefalas, PA-C  fluticasone (FLONASE) 50 MCG/ACT nasal spray  2 puffs in each nostril at bedtime. Patient taking differently: Place 2 sprays into both nostrils daily as needed for allergies. 2 puffs in each nostril at bedtime. 07/11/14   Farrel Gobble, MD  HYDROcodone-acetaminophen (NORCO/VICODIN) 5-325 MG per tablet Take 1 tablet by mouth every 6 (six) hours as needed for moderate pain. Patient not taking: Reported on 12/23/2014 10/24/14   Farrel Gobble, MD   BP 153/97 mmHg  Pulse 84  Temp(Src) 97.7 F (36.5 C) (Oral)  Resp 14  Ht 5\' 3"  (1.6 m)  Wt 268 lb (121.564 kg)  BMI 47.49 kg/m2  SpO2 98%  LMP 10/07/2012 Physical Exam  Constitutional: She is oriented to person, place, and time. She appears well-developed and well-nourished. No distress.  HENT:  Head: Normocephalic and atraumatic.  Eyes: Conjunctivae and EOM are normal.  Cardiovascular: Normal rate, regular rhythm and normal heart sounds.   No murmur heard. Pulmonary/Chest: Effort normal and breath sounds normal. No stridor. No respiratory distress. She has no wheezes. She has no rales.  Left upper chest chemotherapy port looks good and functional.  Abdominal: She exhibits no distension and no mass.  Para-midline scars, vertically oriented. Non-peritoneal belly.  Musculoskeletal: She exhibits no edema.  Neurological: She is alert and oriented to person, place, and time. No cranial  nerve deficit.  Skin: Skin is warm and dry.  Psychiatric: She has a normal mood and affect.  Nursing note and vitals reviewed.   ED Course  Procedures  DIAGNOSTIC STUDIES: Oxygen Saturation is 100% on room air, normal by my interpretation.    COORDINATION OF CARE: 1:48 PM-Discussed treatment plan which includes CT Abdomen Pelvis, labs and medications with pt at bedside and pt agreed to plan.    Labs Review Labs Reviewed  CBC WITH DIFFERENTIAL/PLATELET - Abnormal; Notable for the following:    RBC 5.53 (*)    Hemoglobin 15.3 (*)    All other components within normal limits  COMPREHENSIVE METABOLIC PANEL - Abnormal; Notable for the following:    Glucose, Bld 330 (*)    All other components within normal limits  URINALYSIS, ROUTINE W REFLEX MICROSCOPIC - Abnormal; Notable for the following:    Specific Gravity, Urine >1.030 (*)    Glucose, UA 500 (*)    Hgb urine dipstick TRACE (*)    Bilirubin Urine SMALL (*)    Ketones, ur 40 (*)    Protein, ur 100 (*)    All other components within normal limits  URINE MICROSCOPIC-ADD ON - Abnormal; Notable for the following:    Squamous Epithelial / LPF MANY (*)    Bacteria, UA FEW (*)    All other components within normal limits  I-STAT CG4 LACTIC ACID, ED - Abnormal; Notable for the following:    Lactic Acid, Venous 2.38 (*)    All other components within normal limits  LIPASE, BLOOD    Imaging Review Ct Abdomen Pelvis Wo Contrast  12/23/2014   CLINICAL DATA:  RIGHT-sided pain. Decreased stool. Nausea and vomiting. Colectomy. Colon cancer. Chemotherapy on going.  EXAM: CT ABDOMEN AND PELVIS WITHOUT CONTRAST  TECHNIQUE: Multidetector CT imaging of the abdomen and pelvis was performed following the standard protocol without IV contrast.  COMPARISON:  11/07/2014.  FINDINGS: Musculoskeletal: Lower lumbar facet arthrosis. No thoracolumbar compression fractures. There is a lesion in the medial LEFT femoral head which has gradually increased  in size and lucency compared to 03/14/2014. Although this is nonspecific, this does have permeative osteolysis and could represent metastatic disease. This would be  an atypical location for AVN but that is also in the differential considerations. A followup MRI of the bony pelvis with and without contrast is recommended in this patient with malignancy. Stable tiny lucency is present in the LEFT sacral ala at the S1 segment.  Lung Bases: Ground-glass attenuation is present in the RIGHT lower lobe, favored to represent atelectasis. Atypical infection is considered unlikely. No airspace consolidation. Previously seen RIGHT middle lobe pulmonary nodule is not visible.  Liver: Unenhanced CT was performed per clinician order. Lack of IV contrast limits sensitivity and specificity, especially for evaluation of abdominal/pelvic solid viscera. Grossly normal.  Spleen:  Normal.  Gallbladder: High attenuation in the gallbladder, probably representing cholelithiasis. This appears unchanged compared to the most recent CT.  Common bile duct:  Grossly normal.  Pancreas: The pancreas appears grossly normal. Lymph nodes are present around the pancreatic head both anteriorly and posteriorly. Although the is are suboptimally evaluated in the absence of contrast, they appear similar to prior with anterior peripancreatic lymph node measuring 17 mm short axis and posterior lymph node measuring 20 mm short axis.  Adrenal glands: Unchanged LEFT adrenal nodule. RIGHT adrenal normal.  Kidneys: No stones or obstruction. Both ureters appear within normal limits.  Stomach:  Normal.  Distended with contrast.  Small bowel: Duodenum appears normal. There is progressive dilation of small bowel extending into the large ventral hernia. Small bowel obstruction is present with a transition point on image number 50, series 2 at the margin of the hernia. Dilated small bowel proximal to the transition point measures 35 mm. Distal small bowel is  decompressed.  Colon: Ascending colectomy and ileocolonic anastomosis. No contrast within the colon.  Pelvic Genitourinary:  Hysterectomy.  Peritoneum: Small amount of free fluid, likely reactive to small bowel obstruction. Scarring in the RIGHT upper quadrant appears similar to prior. No free air.  Vasculature: Atherosclerosis.  No gross acute abnormality.  Body Wall: Large ventral incisional hernia.  IMPRESSION: 1. Small bowel obstruction with transition point in the RIGHT-sided margin of the large ventral hernia. Obstruction appears high-grade. 2. Ascending colectomy. 3. Stable peripancreatic lymph nodes. 4. Gallstones or sludge.  No change from prior. 5. Radiolucent lesion in the medial LEFT femoral head has been slowly increasing over time. Metastatic disease is in the differential considerations. Follow-up nonemergent MRI of the pelvis with and without contrast is recommended for further assessment.   Electronically Signed   By: Dereck Ligas M.D.   On: 12/23/2014 16:21   after CT results were available I reviewed the findings with our surgeon on-call. With the patient's history, concern for SBO, she will be transferred to our affiliated facility.  Dr. Ninfa Linden, the surgeon on call will be made aware of the patient's impending arrival.  On repeat exam the patient remains awake and alert, in no distress, though she has continued episodes of pain.  With hyperglycemia, mildly elevated lactic acid, patient will continue to receive IV fluid rehydration in addition to antiemetics, analgesics.   MDM   Final diagnoses:  Small bowel obstruction   Patient with a history of colon cancer, status post hemicolectomy 2 years ago now presents with abdominal pain, decreased bowel movements, and recent vomiting. Patient is found to have bowel obstruction in a ventral hernia.  Patient is not peritoneal, not in distress, but required transfer to our affiliated facility due to the absence of a surgeon on-call  locally. Prior to transfer the patient's pain was controlled, nausea controlled, and she continued received fluid rehydration  for her hyperglycemia, mild lactic acidosis.  I personally performed the services described in this documentation, which was scribed in my presence. The recorded information has been reviewed and is accurate.      Carmin Muskrat, MD 12/23/14 [phone]

## 2014-12-23 NOTE — ED Notes (Signed)
Patient being transported by Marylyn Ishihara, [place]

## 2014-12-23 NOTE — ED Notes (Signed)
Admitting Physician at the bedside.  

## 2014-12-23 NOTE — ED Notes (Signed)
Report given to [facility] via phone. ETA 30 minutes-patient made aware.

## 2014-12-24 ENCOUNTER — Inpatient Hospital Stay (HOSPITAL_COMMUNITY): Payer: BLUE CROSS/BLUE SHIELD

## 2014-12-24 LAB — GLUCOSE, CAPILLARY
GLUCOSE-CAPILLARY: 190 mg/dL — AB (ref 70–99)
GLUCOSE-CAPILLARY: 276 mg/dL — AB (ref 70–99)
Glucose-Capillary: 229 mg/dL — ABNORMAL HIGH (ref 70–99)
Glucose-Capillary: 188 mg/dL — ABNORMAL HIGH (ref 70–99)
Glucose-Capillary: 169 mg/dL — ABNORMAL HIGH (ref 70–99)

## 2014-12-24 MED ORDER — HYDROMORPHONE HCL 1 MG/ML IJ SOLN
1.0000 mg | Freq: Once | INTRAMUSCULAR | Status: AC
  Administered 2014-12-24: 1 mg via INTRAVENOUS
  Filled 2014-12-24: qty 1

## 2014-12-24 MED ORDER — INSULIN GLARGINE 100 UNIT/ML ~~LOC~~ SOLN
8.0000 [IU] | Freq: Every day | SUBCUTANEOUS | Status: DC
  Administered 2014-12-24 – 2014-12-27 (×4): 8 [IU] via SUBCUTANEOUS
  Filled 2014-12-24 (×4): qty 0.08

## 2014-12-24 NOTE — Progress Notes (Signed)
[facility] PROGRESS NOTE  Monica Allen SJG:[id number] DOB: 01-Jun-1956 DOA: 12/23/2014 PCP: [name]  Summary I have seen and examined Monica Allen at bedside and reviewed her chart. Appreciate gen surgery. Monica Allen is a pleasant 59 y.o. female with metastatic invasive adenocarcinoma of the colon status post partial colectomy and on chemotherapy, history of cardiomyopathy last EF 55% 2009, diabetes mellitus, hypertension, admitted from [facility] with complaints of abdominal pain associated with nausea and vomiting ongoing for last 2-3 days before admission found to be due to SBO. CT abdomen/pelvis showed "1. Small bowel obstruction with transition point in the RIGHT-sided margin of the large ventral hernia. Obstruction appears high-grade. 2. Ascending colectomy. 3. Stable peripancreatic lymph nodes. 4. Gallstones or sludge. No change from prior. 5. Radiolucent lesion in the medial LEFT femoral head has been slowly increasing over time. Metastatic disease is in the differential considerations. Follow-up nonemergent MRI of the pelvis with and without contrast is recommended for further assessment". Patient is being treated conservatively with NGT to LIS. She tells me she is passing gas/bowel movement. Her tube has been clamped. Will defer to surgery for further management. There is no obvious evidence of infection at present. Plan SBO (small bowel obstruction)  Defer to gen surgery. Invasive adenocarcinoma of colon  Defer to oncology. Type 2 diabetes mellitus  Sugars not optimally controlled.  Glipizide on hold while NPO.  Add Lantus  Continue SSI Essential hypertension/Chronic CHF (congestive heart failure)  Resume regular meds once patient can take oral. Code Status: Full code. Family Communication: with patient Disposition Plan: Eventually home.   Consultants:  General surgery.  Procedures:  NGT to LIS.  Antibiotics:  None  HPI/Subjective: Feels  better. No pain.  Objective: Filed Vitals:   12/24/14 1323  BP: 130/75  Pulse: 87  Temp: 97.6 F (36.4 C)  Resp: 16    Intake/Output Summary (Last 24 hours) at 12/24/14 1710 Last data filed at 12/24/14 1536  Gross per 24 hour  Intake 848.33 ml  Output   1030 ml  Net -181.67 ml   Filed Weights   12/23/14 1912 12/23/14 2101 12/24/14 0528  Weight: 122.018 kg (269 lb) 114.896 kg (253 lb 4.8 oz) 115.803 kg (255 lb 4.8 oz)    Exam:   General:  Comfortable at rest. NGT in place.  Cardiovascular: S1-S2 normal. No murmurs. Pulse regular.  Respiratory: Good air entry bilaterally. No rhonchi or rales.  Abdomen: Soft and nontender. Hyperactive bowel sounds. No organomegaly.  Musculoskeletal: No pedal edema   Neurological: Intact  Data Reviewed: Basic Metabolic Panel:  Recent Labs Lab 12/23/14 1355  NA 136  K 4.2  CL 101  CO2 23  GLUCOSE 330*  BUN 9  CREATININE 0.74  CALCIUM 9.1   Liver Function Tests:  Recent Labs Lab 12/23/14 1355  AST 22  ALT 18  ALKPHOS 71  BILITOT 0.9  PROT 7.4  ALBUMIN 4.1    Recent Labs Lab 12/23/14 1355  LIPASE 17   No results for input(s): AMMONIA in the last 168 hours. CBC:  Recent Labs Lab 12/23/14 1355  WBC 8.4  NEUTROABS 5.2  HGB 15.3*  HCT 45.7  MCV 82.6  PLT 190   Cardiac Enzymes: No results for input(s): CKTOTAL, CKMB, CKMBINDEX, TROPONINI in the last 168 hours. BNP (last 3 results) No results for input(s): BNP in the last 8760 hours.  ProBNP (last 3 results) No results for input(s): PROBNP in the last 8760 hours.  CBG:  Recent Labs  Lab 12/24/14 0015 12/24/14 0534 12/24/14 1228  GLUCAP 276* 229* 188*    No results found for this or any previous visit (from the past 240 hour(s)).   Studies: Ct Abdomen Pelvis Wo Contrast  12/23/2014   CLINICAL DATA:  RIGHT-sided pain. Decreased stool. Nausea and vomiting. Colectomy. Colon cancer. Chemotherapy on going.  EXAM: CT ABDOMEN AND PELVIS WITHOUT  CONTRAST  TECHNIQUE: Multidetector CT imaging of the abdomen and pelvis was performed following the standard protocol without IV contrast.  COMPARISON:  11/07/2014.  FINDINGS: Musculoskeletal: Lower lumbar facet arthrosis. No thoracolumbar compression fractures. There is a lesion in the medial LEFT femoral head which has gradually increased in size and lucency compared to 03/14/2014. Although this is nonspecific, this does have permeative osteolysis and could represent metastatic disease. This would be an atypical location for AVN but that is also in the differential considerations. A followup MRI of the bony pelvis with and without contrast is recommended in this patient with malignancy. Stable tiny lucency is present in the LEFT sacral ala at the S1 segment.  Lung Bases: Ground-glass attenuation is present in the RIGHT lower lobe, favored to represent atelectasis. Atypical infection is considered unlikely. No airspace consolidation. Previously seen RIGHT middle lobe pulmonary nodule is not visible.  Liver: Unenhanced CT was performed per clinician order. Lack of IV contrast limits sensitivity and specificity, especially for evaluation of abdominal/pelvic solid viscera. Grossly normal.  Spleen:  Normal.  Gallbladder: High attenuation in the gallbladder, probably representing cholelithiasis. This appears unchanged compared to the most recent CT.  Common bile duct:  Grossly normal.  Pancreas: The pancreas appears grossly normal. Lymph nodes are present around the pancreatic head both anteriorly and posteriorly. Although the is are suboptimally evaluated in the absence of contrast, they appear similar to prior with anterior peripancreatic lymph node measuring 17 mm short axis and posterior lymph node measuring 20 mm short axis.  Adrenal glands: Unchanged LEFT adrenal nodule. RIGHT adrenal normal.  Kidneys: No stones or obstruction. Both ureters appear within normal limits.  Stomach:  Normal.  Distended with contrast.   Small bowel: Duodenum appears normal. There is progressive dilation of small bowel extending into the large ventral hernia. Small bowel obstruction is present with a transition point on image number 50, series 2 at the margin of the hernia. Dilated small bowel proximal to the transition point measures 35 mm. Distal small bowel is decompressed.  Colon: Ascending colectomy and ileocolonic anastomosis. No contrast within the colon.  Pelvic Genitourinary:  Hysterectomy.  Peritoneum: Small amount of free fluid, likely reactive to small bowel obstruction. Scarring in the RIGHT upper quadrant appears similar to prior. No free air.  Vasculature: Atherosclerosis.  No gross acute abnormality.  Body Wall: Large ventral incisional hernia.  IMPRESSION: 1. Small bowel obstruction with transition point in the RIGHT-sided margin of the large ventral hernia. Obstruction appears high-grade. 2. Ascending colectomy. 3. Stable peripancreatic lymph nodes. 4. Gallstones or sludge.  No change from prior. 5. Radiolucent lesion in the medial LEFT femoral head has been slowly increasing over time. Metastatic disease is in the differential considerations. Follow-up nonemergent MRI of the pelvis with and without contrast is recommended for further assessment.   Electronically Signed   By: Dereck Ligas M.D.   On: 12/23/2014 16:21   Dg Abd 2 Views  12/24/2014   CLINICAL DATA:  Small bowel obstruction diagnosed yesterday. Right-sided abdominal pain. Decreased bowel movements. Nausea and vomiting. Colectomy.  EXAM: ABDOMEN - 2 VIEW  COMPARISON:  CT 12/23/2014  FINDINGS: Nasogastric tube has its tip in the gastric fundus. Contrast previously administered has now largely passed into the colon. There continue to be some dilated fluid and air-filled loops of small bowel in the central abdomen. No free air. Findings are consistent with partial small bowel obstruction. Difficult to appreciate the fact that there is a hernia based on the frontal  view abdominal radiographs.  IMPRESSION: Partial small bowel obstruction. Passage of contrast into the colon. Continued loops of dilated fluid and air-filled loops of small bowel in the central abdomen.   Electronically Signed   By: Nelson Chimes M.D.   On: 12/24/2014 07:15    Scheduled Meds: . heparin  5,000 Units Subcutaneous 3 times per day  . insulin aspart  0-15 Units Subcutaneous Q6H   Continuous Infusions: . sodium chloride 50 mL/hr at 12/24/14 0227     Time spent: 25 minutes    Nehemias Sauceda  [facility] Pager [phone]. If 7PM-7AM, please contact night-coverage at [email or web address], password [facility] 12/24/2014, 5:10 PM  LOS: 1 day

## 2014-12-24 NOTE — Progress Notes (Signed)
Patient ID: Monica Allen, female   DOB: 12-20-1955, 59 y.o.   MRN: [id number]     [facility]      [address] [place], [place] [address]    Phone: [phone] FAX: [phone]     Subjective: No flatus.  No n/v.  330ml NGT, bilious output.   Objective:  Vital signs:  Filed Vitals:   12/23/14 1930 12/23/14 1945 12/23/14 2101 12/24/14 0528  BP: 116/71 109/70 132/81 107/66  Pulse: 95 91 87 85  Temp:   98.1 F (36.7 C) 97.7 F (36.5 C)  TempSrc:   Oral Axillary  Resp: $Remo'20 15 14 16  'cslrV$ Height:   '5\' 3"'$  (1.6 m)   Weight:   253 lb 4.8 oz (114.896 kg) 255 lb 4.8 oz (115.803 kg)  SpO2: 98% 99% 96% 94%       Intake/Output   Yesterday:  02/14 0701 - 02/15 0700 In: 210.8 [I.V.:210.8] Out: 730 [Urine:350; Emesis/NG output:380] This shift:     Physical Exam: General: Pt awake/alert/oriented x4 in no acute distress Abdomen: Soft.  Nondistended. Non tender.  No evidence of peritonitis.  No incarcerated hernias.    Problem List:   Principal Problem:   SBO (small bowel obstruction) Active Problems:   Invasive adenocarcinoma of colon   Type 2 diabetes mellitus   Essential hypertension   Chronic CHF (congestive heart failure)    Results:   Labs: Results for orders placed or performed during the hospital encounter of 12/23/14 (from the past 48 hour(s))  CBC with Differential     Status: Abnormal   Collection Time: 12/23/14  1:55 PM  Result Value Ref Range   WBC 8.4 4.0 - 10.5 K/uL   RBC 5.53 (H) 3.87 - 5.11 MIL/uL   Hemoglobin 15.3 (H) 12.0 - 15.0 g/dL   HCT 45.7 36.0 - 46.0 %   MCV 82.6 78.0 - 100.0 fL   MCH 27.7 26.0 - 34.0 pg   MCHC 33.5 30.0 - 36.0 g/dL   RDW 13.6 11.5 - 15.5 %   Platelets 190 150 - 400 K/uL   Neutrophils Relative % 62 43 - 77 %   Neutro Abs 5.2 1.7 - 7.7 K/uL   Lymphocytes Relative 29 12 - 46 %   Lymphs Abs 2.5 0.7 - 4.0 K/uL   Monocytes Relative 7 3 - 12 %   Monocytes Absolute 0.6 0.1 - 1.0  K/uL   Eosinophils Relative 2 0 - 5 %   Eosinophils Absolute 0.1 0.0 - 0.7 K/uL   Basophils Relative 0 0 - 1 %   Basophils Absolute 0.0 0.0 - 0.1 K/uL  Comprehensive metabolic panel     Status: Abnormal   Collection Time: 12/23/14  1:55 PM  Result Value Ref Range   Sodium 136 135 - 145 mmol/L   Potassium 4.2 3.5 - 5.1 mmol/L   Chloride 101 96 - 112 mmol/L   CO2 23 19 - 32 mmol/L   Glucose, Bld 330 (H) 70 - 99 mg/dL   BUN 9 6 - 23 mg/dL   Creatinine, Ser 0.74 0.50 - 1.10 mg/dL   Calcium 9.1 8.4 - 10.5 mg/dL   Total Protein 7.4 6.0 - 8.3 g/dL   Albumin 4.1 3.5 - 5.2 g/dL   AST 22 0 - 37 U/L   ALT 18 0 - 35 U/L   Alkaline Phosphatase 71 39 - 117 U/L   Total Bilirubin 0.9 0.3 - 1.2 mg/dL   GFR  calc non Af Amer >90 >90 mL/min   GFR calc Af Amer >90 >90 mL/min    Comment: (NOTE) The eGFR has been calculated using the CKD EPI equation. This calculation has not been validated in all clinical situations. eGFR's persistently <90 mL/min signify possible Chronic Kidney Disease.    Anion gap 12 5 - 15  Lipase, blood     Status: None   Collection Time: 12/23/14  1:55 PM  Result Value Ref Range   Lipase 17 11 - 59 U/L  I-Stat CG4 Lactic Acid, ED     Status: Abnormal   Collection Time: 12/23/14  2:13 PM  Result Value Ref Range   Lactic Acid, Venous 2.38 (HH) 0.5 - 2.0 mmol/L  Urinalysis, Routine w reflex microscopic     Status: Abnormal   Collection Time: 12/23/14  3:55 PM  Result Value Ref Range   Color, Urine YELLOW YELLOW   APPearance CLEAR CLEAR   Specific Gravity, Urine >1.030 (H) 1.005 - 1.030   pH 6.0 5.0 - 8.0   Glucose, UA 500 (A) NEGATIVE mg/dL   Hgb urine dipstick TRACE (A) NEGATIVE   Bilirubin Urine SMALL (A) NEGATIVE   Ketones, ur 40 (A) NEGATIVE mg/dL   Protein, ur 100 (A) NEGATIVE mg/dL   Urobilinogen, UA 0.2 0.0 - 1.0 mg/dL   Nitrite NEGATIVE NEGATIVE   Leukocytes, UA NEGATIVE NEGATIVE  Urine microscopic-add on     Status: Abnormal   Collection Time: 12/23/14   3:55 PM  Result Value Ref Range   Squamous Epithelial / LPF MANY (A) RARE   WBC, UA 3-6 <3 WBC/hpf   RBC / HPF 0-2 <3 RBC/hpf   Bacteria, UA FEW (A) RARE  Glucose, capillary     Status: Abnormal   Collection Time: 12/24/14 12:15 AM  Result Value Ref Range   Glucose-Capillary 276 (H) 70 - 99 mg/dL   Comment 1 Notify RN    Comment 2 Documented in Char   Glucose, capillary     Status: Abnormal   Collection Time: 12/24/14  5:34 AM  Result Value Ref Range   Glucose-Capillary 229 (H) 70 - 99 mg/dL   Comment 1 Notify RN    Comment 2 Documented in Char     Imaging / Studies: Ct Abdomen Pelvis Wo Contrast  12/23/2014   CLINICAL DATA:  RIGHT-sided pain. Decreased stool. Nausea and vomiting. Colectomy. Colon cancer. Chemotherapy on going.  EXAM: CT ABDOMEN AND PELVIS WITHOUT CONTRAST  TECHNIQUE: Multidetector CT imaging of the abdomen and pelvis was performed following the standard protocol without IV contrast.  COMPARISON:  11/07/2014.  FINDINGS: Musculoskeletal: Lower lumbar facet arthrosis. No thoracolumbar compression fractures. There is a lesion in the medial LEFT femoral head which has gradually increased in size and lucency compared to 03/14/2014. Although this is nonspecific, this does have permeative osteolysis and could represent metastatic disease. This would be an atypical location for AVN but that is also in the differential considerations. A followup MRI of the bony pelvis with and without contrast is recommended in this patient with malignancy. Stable tiny lucency is present in the LEFT sacral ala at the S1 segment.  Lung Bases: Ground-glass attenuation is present in the RIGHT lower lobe, favored to represent atelectasis. Atypical infection is considered unlikely. No airspace consolidation. Previously seen RIGHT middle lobe pulmonary nodule is not visible.  Liver: Unenhanced CT was performed per clinician order. Lack of IV contrast limits sensitivity and specificity, especially for evaluation  of abdominal/pelvic solid viscera. Grossly normal.  Spleen:  Normal.  Gallbladder: High attenuation in the gallbladder, probably representing cholelithiasis. This appears unchanged compared to the most recent CT.  Common bile duct:  Grossly normal.  Pancreas: The pancreas appears grossly normal. Lymph nodes are present around the pancreatic head both anteriorly and posteriorly. Although the is are suboptimally evaluated in the absence of contrast, they appear similar to prior with anterior peripancreatic lymph node measuring 17 mm short axis and posterior lymph node measuring 20 mm short axis.  Adrenal glands: Unchanged LEFT adrenal nodule. RIGHT adrenal normal.  Kidneys: No stones or obstruction. Both ureters appear within normal limits.  Stomach:  Normal.  Distended with contrast.  Small bowel: Duodenum appears normal. There is progressive dilation of small bowel extending into the large ventral hernia. Small bowel obstruction is present with a transition point on image number 50, series 2 at the margin of the hernia. Dilated small bowel proximal to the transition point measures 35 mm. Distal small bowel is decompressed.  Colon: Ascending colectomy and ileocolonic anastomosis. No contrast within the colon.  Pelvic Genitourinary:  Hysterectomy.  Peritoneum: Small amount of free fluid, likely reactive to small bowel obstruction. Scarring in the RIGHT upper quadrant appears similar to prior. No free air.  Vasculature: Atherosclerosis.  No gross acute abnormality.  Body Wall: Large ventral incisional hernia.  IMPRESSION: 1. Small bowel obstruction with transition point in the RIGHT-sided margin of the large ventral hernia. Obstruction appears high-grade. 2. Ascending colectomy. 3. Stable peripancreatic lymph nodes. 4. Gallstones or sludge.  No change from prior. 5. Radiolucent lesion in the medial LEFT femoral head has been slowly increasing over time. Metastatic disease is in the differential considerations.  Follow-up nonemergent MRI of the pelvis with and without contrast is recommended for further assessment.   Electronically Signed   By: Dereck Ligas M.D.   On: 12/23/2014 16:21   Dg Abd 2 Views  12/24/2014   CLINICAL DATA:  Small bowel obstruction diagnosed yesterday. Right-sided abdominal pain. Decreased bowel movements. Nausea and vomiting. Colectomy.  EXAM: ABDOMEN - 2 VIEW  COMPARISON:  CT 12/23/2014  FINDINGS: Nasogastric tube has its tip in the gastric fundus. Contrast previously administered has now largely passed into the colon. There continue to be some dilated fluid and air-filled loops of small bowel in the central abdomen. No free air. Findings are consistent with partial small bowel obstruction. Difficult to appreciate the fact that there is a hernia based on the frontal view abdominal radiographs.  IMPRESSION: Partial small bowel obstruction. Passage of contrast into the colon. Continued loops of dilated fluid and air-filled loops of small bowel in the central abdomen.   Electronically Signed   By: Nelson Chimes M.D.   On: 12/24/2014 07:15    Medications / Allergies:  Scheduled Meds: . heparin  5,000 Units Subcutaneous 3 times per day  . insulin aspart  0-15 Units Subcutaneous Q6H   Continuous Infusions: . 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 No flatus yet.  Non tender on exam -continue NGT decompression -bowel rest -IVF hydration  -pain control -mobilize -SCD/heparin   Erby Pian, [facility] Surgery Pager [phone](7A-4:30P) For consults and floor pages call [phone](7A-4:30P)  12/24/2014 9:02  AM    

## 2014-12-24 NOTE — Progress Notes (Signed)
Inpatient Diabetes Program Recommendations  AACE/ADA: New Consensus Statement on Inpatient Glycemic Control (2013)  Target Ranges:  Prepandial:   less than 140 mg/dL      Peak postprandial:   less than 180 mg/dL (1-2 hours)      Critically ill patients:  140 - 180 mg/dL   Reason for Assessment:  Results for Monica Allen, Monica Allen (MRN [id number]) as of 12/24/2014 12:48  Ref. Range 12/24/2014 00:15 12/24/2014 05:34 12/24/2014 06:40 12/24/2014 12:28  Glucose-Capillary Latest Range: 70-99 mg/dL 276 (H) 229 (H)  188 (H)    Diabetes history: Type 2 diabetes Outpatient Diabetes medications: Glucotrol 10 mg daily,  Current orders for Inpatient glycemic control:  Novolog moderate q 4 hours.  May consider adding low dose basal insulin while patient is in the hospital.  Consider Lantus 12 units daily.  Thanks, Adah Perl, RN, BC-ADM Inpatient Diabetes Coordinator Pager [phone]

## 2014-12-24 NOTE — Progress Notes (Signed)
   12/24/14 1500  Clinical Encounter Type  Visited With Patient  Visit Type Initial  Referral From Patient  Spiritual Encounters  Spiritual Needs Sacred text  Stress Factors  Patient Stress Factors Health changes   Chaplain was paged to patient's room at 2:33 PM. Page indicated patient would like a Bible. Chaplain made a visit to patient and delivered a Bible. Patient said she has seen better days but believes things are getting better. Patient was appreciative of Bible but is not sure she will be able to read it because she does not have her eyeglasses. Chaplain will continue to provide emotional and spiritual support for patient as needed. Fredie Majano, Claudius Sis, Chaplain  3:18 PM

## 2014-12-25 LAB — COMPREHENSIVE METABOLIC PANEL
ALT: 15 U/L (ref 0–35)
AST: 23 U/L (ref 0–37)
Albumin: 2.9 g/dL — ABNORMAL LOW (ref 3.5–5.2)
Alkaline Phosphatase: 56 U/L (ref 39–117)
Anion gap: 10 (ref 5–15)
BUN: 9 mg/dL (ref 6–23)
CO2: 24 mmol/L (ref 19–32)
CREATININE: 0.66 mg/dL (ref 0.50–1.10)
Calcium: 8.1 mg/dL — ABNORMAL LOW (ref 8.4–10.5)
Chloride: 105 mmol/L (ref 96–112)
GFR calc Af Amer: >90 mL/min (ref >90)
GFR calc non Af Amer: >90 mL/min (ref >90)
Glucose, Bld: 169 mg/dL — ABNORMAL HIGH (ref 70–99)
Potassium: 3.7 mmol/L (ref 3.5–5.1)
Sodium: 139 mmol/L (ref 135–145)
Total Bilirubin: 1.1 mg/dL (ref 0.3–1.2)
Total Protein: 6 g/dL (ref 6.0–8.3)

## 2014-12-25 LAB — GLUCOSE, CAPILLARY
GLUCOSE-CAPILLARY: 161 mg/dL — AB (ref 70–99)
GLUCOSE-CAPILLARY: 229 mg/dL — AB (ref 70–99)
GLUCOSE-CAPILLARY: 240 mg/dL — AB (ref 70–99)
Glucose-Capillary: 146 mg/dL — ABNORMAL HIGH (ref 70–99)

## 2014-12-25 LAB — CBC
HCT: 39.6 % (ref 36.0–46.0)
Hemoglobin: 13 g/dL (ref 12.0–15.0)
MCH: 26.9 pg (ref 26.0–34.0)
MCHC: 32.8 g/dL (ref 30.0–36.0)
MCV: 82 fL (ref 78.0–100.0)
PLATELETS: 146 10*3/uL — AB (ref 150–400)
RBC: 4.83 MIL/uL (ref 3.87–5.11)
RDW: 14 % (ref 11.5–15.5)
WBC: 6.4 10*3/uL (ref 4.0–10.5)

## 2014-12-25 MED ORDER — HYDROMORPHONE HCL 1 MG/ML IJ SOLN
1.0000 mg | INTRAMUSCULAR | Status: DC | PRN
  Administered 2014-12-25: 1 mg via INTRAVENOUS
  Filled 2014-12-25: qty 1

## 2014-12-25 MED ORDER — SODIUM CHLORIDE 0.9 % IJ SOLN
10.0000 mL | INTRAMUSCULAR | Status: DC | PRN
  Administered 2014-12-25: 10 mL

## 2014-12-25 MED ORDER — PHENOL 1.4 % MT LIQD
1.0000 | OROMUCOSAL | Status: DC | PRN
  Administered 2014-12-25: 1 via OROMUCOSAL
  Filled 2014-12-25: qty 177

## 2014-12-25 NOTE — Progress Notes (Signed)
Patient ID: Monica Allen, female   DOB: 1955/12/03, 59 y.o.   MRN: [id number]  Agree with NP Will d/c NG

## 2014-12-25 NOTE — Progress Notes (Signed)
[facility] PROGRESS NOTE  Monica Allen LZJ:[id number] DOB: December 18, 1955 DOA: 12/23/2014 PCP: [name]  Summary Appreciate gen surgery. Monica Allen is a pleasant 59 y.o. female with metastatic invasive adenocarcinoma of the colon status post partial colectomy and on chemotherapy, history of cardiomyopathy last EF 55% 2009, diabetes mellitus, hypertension, admitted from [facility] with complaints of abdominal pain associated with nausea and vomiting ongoing for last 2-3 days before admission found to be due to SBO. CT abdomen/pelvis showed "1. Small bowel obstruction with transition point in the RIGHT-sided margin of the large ventral hernia. Obstruction appears high-grade. 2. Ascending colectomy. 3. Stable peripancreatic lymph nodes. 4. Gallstones or sludge. No change from prior. 5. Radiolucent lesion in the medial LEFT femoral head has been slowly increasing over time. Metastatic disease is in the differential considerations. Follow-up nonemergent MRI of the pelvis with and without contrast is recommended for further assessment". Patient is being managed conservatively, and is now on clear liquids. She complaints of LUQ pain. Will defer to surgery for further management. She will need to follow with oncology for chemo. Plan SBO (small bowel obstruction)  Defer to gen surgery.  Now on clear liquids, seems to be tolerating it. Invasive adenocarcinoma of colon  Defer to oncology. Type 2 diabetes mellitus  Sugars better controlled.  Glipizide on hold till oral intake more assured  Continue Lantus/Continue SSI Essential hypertension/Chronic CHF (congestive heart failure)  Resume regular meds once patient can take oral consistently. Code Status: Full code. Family Communication: with patient Disposition Plan: Eventually home.   Consultants:  General surgery.  Procedures:  NGT to LIS- removed earlier today.  Antibiotics:  None  HPI/Subjective: Feels better.  No pain. Objective: Filed Vitals:   12/25/14 0522  BP: 155/80  Pulse: 85  Temp: 98.4 F (36.9 C)  Resp: 16    Intake/Output Summary (Last 24 hours) at 12/25/14 1101 Last data filed at 12/25/14 0458  Gross per 24 hour  Intake 1385.83 ml  Output    470 ml  Net 915.83 ml   Filed Weights   12/23/14 2101 12/24/14 0528 12/25/14 0420  Weight: 114.896 kg (253 lb 4.8 oz) 115.803 kg (255 lb 4.8 oz) 115.985 kg (255 lb 11.2 oz)    Exam:   General:  Comfortable at rest.  Cardiovascular: S1-S2 normal. No murmurs. Pulse regular.  Respiratory: Good air entry bilaterally. No rhonchi or rales.  Abdomen: Soft and tender LUQ. Normal bowel sounds. Abdominal fullness  Musculoskeletal: No pedal edema   Neurological: Intact  Data Reviewed: Basic Metabolic Panel:  Recent Labs Lab 12/23/14 1355 12/25/14 0434  NA 136 139  K 4.2 3.7  CL 101 105  CO2 23 24  GLUCOSE 330* 169*  BUN 9 9  CREATININE 0.74 0.66  CALCIUM 9.1 8.1*   Liver Function Tests:  Recent Labs Lab 12/23/14 1355 12/25/14 0434  AST 22 23  ALT 18 15  ALKPHOS 71 56  BILITOT 0.9 1.1  PROT 7.4 6.0  ALBUMIN 4.1 2.9*    Recent Labs Lab 12/23/14 1355  LIPASE 17   No results for input(s): AMMONIA in the last 168 hours. CBC:  Recent Labs Lab 12/23/14 1355 12/25/14 0434  WBC 8.4 6.4  NEUTROABS 5.2  --   HGB 15.3* 13.0  HCT 45.7 39.6  MCV 82.6 82.0  PLT 190 146*   Cardiac Enzymes: No results for input(s): CKTOTAL, CKMB, CKMBINDEX, TROPONINI in the last 168 hours. BNP (last 3 results) No results for input(s): BNP in the  last 8760 hours.  ProBNP (last 3 results) No results for input(s): PROBNP in the last 8760 hours.  CBG:  Recent Labs Lab 12/24/14 1228 12/24/14 1751 12/24/14 2148 12/25/14 0401 12/25/14 0623  GLUCAP 188* 190* 169* 161* 146*    No results found for this or any previous visit (from the past 240 hour(s)).   Studies: Ct Abdomen Pelvis Wo Contrast  12/23/2014   CLINICAL  DATA:  RIGHT-sided pain. Decreased stool. Nausea and vomiting. Colectomy. Colon cancer. Chemotherapy on going.  EXAM: CT ABDOMEN AND PELVIS WITHOUT CONTRAST  TECHNIQUE: Multidetector CT imaging of the abdomen and pelvis was performed following the standard protocol without IV contrast.  COMPARISON:  11/07/2014.  FINDINGS: Musculoskeletal: Lower lumbar facet arthrosis. No thoracolumbar compression fractures. There is a lesion in the medial LEFT femoral head which has gradually increased in size and lucency compared to 03/14/2014. Although this is nonspecific, this does have permeative osteolysis and could represent metastatic disease. This would be an atypical location for AVN but that is also in the differential considerations. A followup MRI of the bony pelvis with and without contrast is recommended in this patient with malignancy. Stable tiny lucency is present in the LEFT sacral ala at the S1 segment.  Lung Bases: Ground-glass attenuation is present in the RIGHT lower lobe, favored to represent atelectasis. Atypical infection is considered unlikely. No airspace consolidation. Previously seen RIGHT middle lobe pulmonary nodule is not visible.  Liver: Unenhanced CT was performed per clinician order. Lack of IV contrast limits sensitivity and specificity, especially for evaluation of abdominal/pelvic solid viscera. Grossly normal.  Spleen:  Normal.  Gallbladder: High attenuation in the gallbladder, probably representing cholelithiasis. This appears unchanged compared to the most recent CT.  Common bile duct:  Grossly normal.  Pancreas: The pancreas appears grossly normal. Lymph nodes are present around the pancreatic head both anteriorly and posteriorly. Although the is are suboptimally evaluated in the absence of contrast, they appear similar to prior with anterior peripancreatic lymph node measuring 17 mm short axis and posterior lymph node measuring 20 mm short axis.  Adrenal glands: Unchanged LEFT adrenal nodule.  RIGHT adrenal normal.  Kidneys: No stones or obstruction. Both ureters appear within normal limits.  Stomach:  Normal.  Distended with contrast.  Small bowel: Duodenum appears normal. There is progressive dilation of small bowel extending into the large ventral hernia. Small bowel obstruction is present with a transition point on image number 50, series 2 at the margin of the hernia. Dilated small bowel proximal to the transition point measures 35 mm. Distal small bowel is decompressed.  Colon: Ascending colectomy and ileocolonic anastomosis. No contrast within the colon.  Pelvic Genitourinary:  Hysterectomy.  Peritoneum: Small amount of free fluid, likely reactive to small bowel obstruction. Scarring in the RIGHT upper quadrant appears similar to prior. No free air.  Vasculature: Atherosclerosis.  No gross acute abnormality.  Body Wall: Large ventral incisional hernia.  IMPRESSION: 1. Small bowel obstruction with transition point in the RIGHT-sided margin of the large ventral hernia. Obstruction appears high-grade. 2. Ascending colectomy. 3. Stable peripancreatic lymph nodes. 4. Gallstones or sludge.  No change from prior. 5. Radiolucent lesion in the medial LEFT femoral head has been slowly increasing over time. Metastatic disease is in the differential considerations. Follow-up nonemergent MRI of the pelvis with and without contrast is recommended for further assessment.   Electronically Signed   By: Dereck Ligas M.D.   On: 12/23/2014 16:21   Dg Abd 2 Views  12/24/2014  CLINICAL DATA:  Small bowel obstruction diagnosed yesterday. Right-sided abdominal pain. Decreased bowel movements. Nausea and vomiting. Colectomy.  EXAM: ABDOMEN - 2 VIEW  COMPARISON:  CT 12/23/2014  FINDINGS: Nasogastric tube has its tip in the gastric fundus. Contrast previously administered has now largely passed into the colon. There continue to be some dilated fluid and air-filled loops of small bowel in the central abdomen. No free  air. Findings are consistent with partial small bowel obstruction. Difficult to appreciate the fact that there is a hernia based on the frontal view abdominal radiographs.  IMPRESSION: Partial small bowel obstruction. Passage of contrast into the colon. Continued loops of dilated fluid and air-filled loops of small bowel in the central abdomen.   Electronically Signed   By: Nelson Chimes M.D.   On: 12/24/2014 07:15    Scheduled Meds: . heparin  5,000 Units Subcutaneous 3 times per day  . insulin aspart  0-15 Units Subcutaneous Q6H  . insulin glargine  8 Units Subcutaneous Daily   Continuous Infusions: . sodium chloride 50 mL/hr at 12/24/14 2306     Time spent: 25 minutes    Adalid Beckmann  [facility] Pager [phone]. If 7PM-7AM, please contact night-coverage at [email or web address], password [facility] 12/25/2014, 11:01 AM  LOS: 2 days

## 2014-12-25 NOTE — Progress Notes (Signed)
Patient ID: Monica Allen, female   DOB: 27-Jul-1956, 59 y.o.   MRN: [id number]     [facility]      [address] [place], [place] [address]    Phone: [phone] FAX: [phone]     Subjective: Having BMs, flatus.  NGT bothering her, causing indigestion.  211m out.    Objective:  Vital signs:  Filed Vitals:   12/24/14 1323 12/24/14 2057 12/25/14 0420 12/25/14 0522  BP: 130/75 149/90  155/80  Pulse: 87 86  85  Temp: 97.6 F (36.4 C) 98 F (36.7 C)  98.4 F (36.9 C)  TempSrc: Oral Oral  Oral  Resp: _0 Height:      Weight:   255 lb 11.2 oz (115.985 kg)   SpO2:  100%  99%    Last BM Date: 12/24/14  Intake/Output   Yesterday:  02/15 0701 - 02/16 0700 In: 1385.8 [P.O.:60; I.V.:1325.8] Out: 470 [Urine:250; Emesis/NG output:220] This shift:    I/O last 3 completed shifts: In: 1596.7 [P.O.:60; I.V.:1536.7] Out: 1200 [Urine:600; Emesis/NG output:600]    Physical Exam: General: Pt awake/alert/oriented x4 in no acute distress Abdomen: Soft. Nondistended. Non tender. No evidence of peritonitis. No incarcerated hernias.    Problem List:   Principal Problem:   SBO (small bowel obstruction) Active Problems:   Invasive adenocarcinoma of colon   Type 2 diabetes mellitus   Essential hypertension   Chronic CHF (congestive heart failure)    Results:   Labs: Results for orders placed or performed during the hospital encounter of 12/23/14 (from the past 48 hour(s))  CBC with Differential     Status: Abnormal   Collection Time: 12/23/14  1:55 PM  Result Value Ref Range   WBC 8.4 4.0 - 10.5 K/uL   RBC 5.53 (H) 3.87 - 5.11 MIL/uL   Hemoglobin 15.3 (H) 12.0 - 15.0 g/dL   HCT 45.7 36.0 - 46.0 %   MCV 82.6 78.0 - 100.0 fL   MCH 27.7 26.0 - 34.0 pg   MCHC 33.5 30.0 - 36.0 g/dL   RDW 13.6 11.5 - 15.5 %   Platelets 190 150 - 400 K/uL   Neutrophils Relative % 62 43 - 77 %   Neutro Abs 5.2 1.7 - 7.7 K/uL    Lymphocytes Relative 29 12 - 46 %   Lymphs Abs 2.5 0.7 - 4.0 K/uL   Monocytes Relative 7 3 - 12 %   Monocytes Absolute 0.6 0.1 - 1.0 K/uL   Eosinophils Relative 2 0 - 5 %   Eosinophils Absolute 0.1 0.0 - 0.7 K/uL   Basophils Relative 0 0 - 1 %   Basophils Absolute 0.0 0.0 - 0.1 K/uL  Comprehensive metabolic panel     Status: Abnormal   Collection Time: 12/23/14  1:55 PM  Result Value Ref Range   Sodium 136 135 - 145 mmol/L   Potassium 4.2 3.5 - 5.1 mmol/L   Chloride 101 96 - 112 mmol/L   CO2 23 19 - 32 mmol/L   Glucose, Bld 330 (H) 70 - 99 mg/dL   BUN 9 6 - 23 mg/dL   Creatinine, Ser 0.74 0.50 - 1.10 mg/dL   Calcium 9.1 8.4 - 10.5 mg/dL   Total Protein 7.4 6.0 - 8.3 g/dL   Albumin 4.1 3.5 - 5.2 g/dL   AST 22 0 - 37 U/L   ALT 18 0 - 35 U/L   Alkaline Phosphatase 71 39 -  117 U/L   Total Bilirubin 0.9 0.3 - 1.2 mg/dL   GFR calc non Af Amer >90 >90 mL/min   GFR calc Af Amer >90 >90 mL/min    Comment: (NOTE) The eGFR has been calculated using the CKD EPI equation. This calculation has not been validated in all clinical situations. eGFR's persistently <90 mL/min signify possible Chronic Kidney Disease.    Anion gap 12 5 - 15  Lipase, blood     Status: None   Collection Time: 12/23/14  1:55 PM  Result Value Ref Range   Lipase 17 11 - 59 U/L  I-Stat CG4 Lactic Acid, ED     Status: Abnormal   Collection Time: 12/23/14  2:13 PM  Result Value Ref Range   Lactic Acid, Venous 2.38 (HH) 0.5 - 2.0 mmol/L  Urinalysis, Routine w reflex microscopic     Status: Abnormal   Collection Time: 12/23/14  3:55 PM  Result Value Ref Range   Color, Urine YELLOW YELLOW   APPearance CLEAR CLEAR   Specific Gravity, Urine >1.030 (H) 1.005 - 1.030   pH 6.0 5.0 - 8.0   Glucose, UA 500 (A) NEGATIVE mg/dL   Hgb urine dipstick TRACE (A) NEGATIVE   Bilirubin Urine SMALL (A) NEGATIVE   Ketones, ur 40 (A) NEGATIVE mg/dL   Protein, ur 100 (A) NEGATIVE mg/dL   Urobilinogen, UA 0.2 0.0 - 1.0 mg/dL    Nitrite NEGATIVE NEGATIVE   Leukocytes, UA NEGATIVE NEGATIVE  Urine microscopic-add on     Status: Abnormal   Collection Time: 12/23/14  3:55 PM  Result Value Ref Range   Squamous Epithelial / LPF MANY (A) RARE   WBC, UA 3-6 <3 WBC/hpf   RBC / HPF 0-2 <3 RBC/hpf   Bacteria, UA FEW (A) RARE  Glucose, capillary     Status: Abnormal   Collection Time: 12/24/14 12:15 AM  Result Value Ref Range   Glucose-Capillary 276 (H) 70 - 99 mg/dL   Comment 1 Notify RN    Comment 2 Documented in Char   Glucose, capillary     Status: Abnormal   Collection Time: 12/24/14  5:34 AM  Result Value Ref Range   Glucose-Capillary 229 (H) 70 - 99 mg/dL   Comment 1 Notify RN    Comment 2 Documented in Char   Glucose, capillary     Status: Abnormal   Collection Time: 12/24/14 12:28 PM  Result Value Ref Range   Glucose-Capillary 188 (H) 70 - 99 mg/dL  Glucose, capillary     Status: Abnormal   Collection Time: 12/24/14  5:51 PM  Result Value Ref Range   Glucose-Capillary 190 (H) 70 - 99 mg/dL  Glucose, capillary     Status: Abnormal   Collection Time: 12/24/14  9:48 PM  Result Value Ref Range   Glucose-Capillary 169 (H) 70 - 99 mg/dL  Glucose, capillary     Status: Abnormal   Collection Time: 12/25/14  4:01 AM  Result Value Ref Range   Glucose-Capillary 161 (H) 70 - 99 mg/dL  CBC     Status: Abnormal   Collection Time: 12/25/14  4:34 AM  Result Value Ref Range   WBC 6.4 4.0 - 10.5 K/uL   RBC 4.83 3.87 - 5.11 MIL/uL   Hemoglobin 13.0 12.0 - 15.0 g/dL   HCT 39.6 36.0 - 46.0 %   MCV 82.0 78.0 - 100.0 fL   MCH 26.9 26.0 - 34.0 pg   MCHC 32.8 30.0 - 36.0 g/dL   RDW  14.0 11.5 - 15.5 %   Platelets 146 (L) 150 - 400 K/uL  Comprehensive metabolic panel     Status: Abnormal   Collection Time: 12/25/14  4:34 AM  Result Value Ref Range   Sodium 139 135 - 145 mmol/L   Potassium 3.7 3.5 - 5.1 mmol/L   Chloride 105 96 - 112 mmol/L   CO2 24 19 - 32 mmol/L   Glucose, Bld 169 (H) 70 - 99 mg/dL   BUN 9 6 - 23  mg/dL   Creatinine, Ser 0.66 0.50 - 1.10 mg/dL   Calcium 8.1 (L) 8.4 - 10.5 mg/dL   Total Protein 6.0 6.0 - 8.3 g/dL   Albumin 2.9 (L) 3.5 - 5.2 g/dL   AST 23 0 - 37 U/L   ALT 15 0 - 35 U/L   Alkaline Phosphatase 56 39 - 117 U/L   Total Bilirubin 1.1 0.3 - 1.2 mg/dL   GFR calc non Af Amer >90 >90 mL/min   GFR calc Af Amer >90 >90 mL/min    Comment: (NOTE) The eGFR has been calculated using the CKD EPI equation. This calculation has not been validated in all clinical situations. eGFR's persistently <90 mL/min signify possible Chronic Kidney Disease.    Anion gap 10 5 - 15  Glucose, capillary     Status: Abnormal   Collection Time: 12/25/14  6:23 AM  Result Value Ref Range   Glucose-Capillary 146 (H) 70 - 99 mg/dL    Imaging / Studies: Ct Abdomen Pelvis Wo Contrast  12/23/2014   CLINICAL DATA:  RIGHT-sided pain. Decreased stool. Nausea and vomiting. Colectomy. Colon cancer. Chemotherapy on going.  EXAM: CT ABDOMEN AND PELVIS WITHOUT CONTRAST  TECHNIQUE: Multidetector CT imaging of the abdomen and pelvis was performed following the standard protocol without IV contrast.  COMPARISON:  11/07/2014.  FINDINGS: Musculoskeletal: Lower lumbar facet arthrosis. No thoracolumbar compression fractures. There is a lesion in the medial LEFT femoral head which has gradually increased in size and lucency compared to 03/14/2014. Although this is nonspecific, this does have permeative osteolysis and could represent metastatic disease. This would be an atypical location for AVN but that is also in the differential considerations. A followup MRI of the bony pelvis with and without contrast is recommended in this patient with malignancy. Stable tiny lucency is present in the LEFT sacral ala at the S1 segment.  Lung Bases: Ground-glass attenuation is present in the RIGHT lower lobe, favored to represent atelectasis. Atypical infection is considered unlikely. No airspace consolidation. Previously seen RIGHT middle  lobe pulmonary nodule is not visible.  Liver: Unenhanced CT was performed per clinician order. Lack of IV contrast limits sensitivity and specificity, especially for evaluation of abdominal/pelvic solid viscera. Grossly normal.  Spleen:  Normal.  Gallbladder: High attenuation in the gallbladder, probably representing cholelithiasis. This appears unchanged compared to the most recent CT.  Common bile duct:  Grossly normal.  Pancreas: The pancreas appears grossly normal. Lymph nodes are present around the pancreatic head both anteriorly and posteriorly. Although the is are suboptimally evaluated in the absence of contrast, they appear similar to prior with anterior peripancreatic lymph node measuring 17 mm short axis and posterior lymph node measuring 20 mm short axis.  Adrenal glands: Unchanged LEFT adrenal nodule. RIGHT adrenal normal.  Kidneys: No stones or obstruction. Both ureters appear within normal limits.  Stomach:  Normal.  Distended with contrast.  Small bowel: Duodenum appears normal. There is progressive dilation of small bowel extending into the large ventral  hernia. Small bowel obstruction is present with a transition point on image number 50, series 2 at the margin of the hernia. Dilated small bowel proximal to the transition point measures 35 mm. Distal small bowel is decompressed.  Colon: Ascending colectomy and ileocolonic anastomosis. No contrast within the colon.  Pelvic Genitourinary:  Hysterectomy.  Peritoneum: Small amount of free fluid, likely reactive to small bowel obstruction. Scarring in the RIGHT upper quadrant appears similar to prior. No free air.  Vasculature: Atherosclerosis.  No gross acute abnormality.  Body Wall: Large ventral incisional hernia.  IMPRESSION: 1. Small bowel obstruction with transition point in the RIGHT-sided margin of the large ventral hernia. Obstruction appears high-grade. 2. Ascending colectomy. 3. Stable peripancreatic lymph nodes. 4. Gallstones or sludge.  No  change from prior. 5. Radiolucent lesion in the medial LEFT femoral head has been slowly increasing over time. Metastatic disease is in the differential considerations. Follow-up nonemergent MRI of the pelvis with and without contrast is recommended for further assessment.   Electronically Signed   By: Dereck Ligas M.D.   On: 12/23/2014 16:21   Dg Abd 2 Views  12/24/2014   CLINICAL DATA:  Small bowel obstruction diagnosed yesterday. Right-sided abdominal pain. Decreased bowel movements. Nausea and vomiting. Colectomy.  EXAM: ABDOMEN - 2 VIEW  COMPARISON:  CT 12/23/2014  FINDINGS: Nasogastric tube has its tip in the gastric fundus. Contrast previously administered has now largely passed into the colon. There continue to be some dilated fluid and air-filled loops of small bowel in the central abdomen. No free air. Findings are consistent with partial small bowel obstruction. Difficult to appreciate the fact that there is a hernia based on the frontal view abdominal radiographs.  IMPRESSION: Partial small bowel obstruction. Passage of contrast into the colon. Continued loops of dilated fluid and air-filled loops of small bowel in the central abdomen.   Electronically Signed   By: Nelson Chimes M.D.   On: 12/24/2014 07:15    Medications / Allergies:  Scheduled Meds: . heparin  5,000 Units Subcutaneous 3 times per day  . insulin aspart  0-15 Units Subcutaneous Q6H  . insulin glargine  8 Units Subcutaneous Daily   Continuous Infusions: . 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

## 2014-12-26 ENCOUNTER — Inpatient Hospital Stay (HOSPITAL_COMMUNITY): Payer: BLUE CROSS/BLUE SHIELD

## 2014-12-26 DIAGNOSIS — E119 Type 2 diabetes mellitus without complications: Secondary | ICD-10-CM

## 2014-12-26 LAB — GLUCOSE, CAPILLARY
GLUCOSE-CAPILLARY: 168 mg/dL — AB (ref 70–99)
GLUCOSE-CAPILLARY: 191 mg/dL — AB (ref 70–99)
GLUCOSE-CAPILLARY: 202 mg/dL — AB (ref 70–99)
GLUCOSE-CAPILLARY: 218 mg/dL — AB (ref 70–99)
Glucose-Capillary: 191 mg/dL — ABNORMAL HIGH (ref 70–99)

## 2014-12-26 MED ORDER — SIMETHICONE 80 MG PO CHEW
80.0000 mg | CHEWABLE_TABLET | Freq: Once | ORAL | Status: AC
  Administered 2014-12-26: 80 mg via ORAL
  Filled 2014-12-26: qty 1

## 2014-12-26 MED ORDER — HYDROMORPHONE HCL 1 MG/ML IJ SOLN
1.0000 mg | INTRAMUSCULAR | Status: DC | PRN
  Administered 2014-12-26 (×6): 1 mg via INTRAVENOUS
  Filled 2014-12-26 (×7): qty 1

## 2014-12-26 MED ORDER — INSULIN ASPART 100 UNIT/ML ~~LOC~~ SOLN
0.0000 [IU] | Freq: Three times a day (TID) | SUBCUTANEOUS | Status: DC
  Administered 2014-12-26 – 2014-12-27 (×2): 5 [IU] via SUBCUTANEOUS
  Administered 2014-12-27: 3 [IU] via SUBCUTANEOUS

## 2014-12-26 MED ORDER — SIMETHICONE 80 MG PO CHEW: 80.0000 mg | CHEWABLE_TABLET | Freq: Four times a day (QID) | ORAL | Status: DC | PRN

## 2014-12-26 MED ORDER — BISACODYL 10 MG RE SUPP
10.0000 mg | Freq: Once | RECTAL | Status: AC
  Administered 2014-12-26: 10 mg via RECTAL
  Filled 2014-12-26: qty 1

## 2014-12-26 NOTE — Progress Notes (Signed)
[facility] PROGRESS NOTE  Monica Allen VQM:[id number] DOB: Oct 15, 1956 DOA: 12/23/2014 PCP: [name]  Summary 59 y.o. female with metastatic invasive adenocarcinoma of the colon status post partial colectomy and on chemotherapy, history of cardiomyopathy last EF 55% 2009, diabetes mellitus, hypertension, admitted from [facility] with complaints of abdominal pain associated with nausea and vomiting ongoing for last 2-3 days before admission found to be due to SBO. CT abdomen/pelvis showed SBO Plan SBO (small bowel obstruction)  Improving. Advanced to full liquids Type 2 diabetes mellitus, uncontrolled  Continue Lantus/Continue SSI. Better controlled Essential hypertension/Chronic CHF (congestive heart failure)  Resume regular meds once patient can take oral consistently. Code Status: Full code. Family Communication: husband Disposition Plan: home when tolerating solids   Consultants:  General surgery.  Antibiotics:  None  HPI/Subjective: tol fulls. Having stools. Some abdominal pain. No dyspnea  Objective: Filed Vitals:   12/26/14 1455  BP: 127/72  Pulse: 88  Temp: 97.6 F (36.4 C)  Resp: 21    Intake/Output Summary (Last 24 hours) at 12/26/14 2152 Last data filed at 12/26/14 1333  Gross per 24 hour  Intake    600 ml  Output      0 ml  Net    600 ml   Filed Weights   12/24/14 0528 12/25/14 0420 12/26/14 0617  Weight: 115.803 kg (255 lb 4.8 oz) 115.985 kg (255 lb 11.2 oz) 115.667 kg (255 lb)    Exam:   General:  Comfortable a and o  Cardiovascular: RRR without MGR  Respiratory: CTA without WRR  Abdomen: soft, NT, ND. Bowel sounds present  Musculoskeletal: No CCE  Data Reviewed: Basic Metabolic Panel:  Recent Labs Lab 12/23/14 1355 12/25/14 0434  NA 136 139  K 4.2 3.7  CL 101 105  CO2 23 24  GLUCOSE 330* 169*  BUN 9 9  CREATININE 0.74 0.66  CALCIUM 9.1 8.1*   Liver Function Tests:  Recent Labs Lab 12/23/14 1355  12/25/14 0434  AST 22 23  ALT 18 15  ALKPHOS 71 56  BILITOT 0.9 1.1  PROT 7.4 6.0  ALBUMIN 4.1 2.9*    Recent Labs Lab 12/23/14 1355  LIPASE 17   No results for input(s): AMMONIA in the last 168 hours. CBC:  Recent Labs Lab 12/23/14 1355 12/25/14 0434  WBC 8.4 6.4  NEUTROABS 5.2  --   HGB 15.3* 13.0  HCT 45.7 39.6  MCV 82.6 82.0  PLT 190 146*   Cardiac Enzymes: No results for input(s): CKTOTAL, CKMB, CKMBINDEX, TROPONINI in the last 168 hours. BNP (last 3 results) No results for input(s): BNP in the last 8760 hours.  ProBNP (last 3 results) No results for input(s): PROBNP in the last 8760 hours.  CBG:  Recent Labs Lab 12/25/14 1800 12/26/14 0049 12/26/14 0610 12/26/14 1148 12/26/14 1658  GLUCAP 240* 218* 191* 191* 168*    No results found for this or any previous visit (from the past 240 hour(s)).   Studies: No results found.  Scheduled Meds: . heparin  5,000 Units Subcutaneous 3 times per day  . insulin aspart  0-15 Units Subcutaneous TID AC & HS  . insulin glargine  8 Units Subcutaneous Daily   Continuous Infusions: . sodium chloride 50 mL/hr at 12/26/14 1706     Time spent: 15 minutes  [facility] [email or web address], password [facility] 12/26/2014, 9:52 PM  LOS: 3 days

## 2014-12-26 NOTE — Progress Notes (Addendum)
Inpatient Diabetes Program Recommendations  AACE/ADA: New Consensus Statement on Inpatient Glycemic Control (2013)  Target Ranges:  Prepandial:   less than 140 mg/dL      Peak postprandial:   less than 180 mg/dL (1-2 hours)      Critically ill patients:  140 - 180 mg/dL   Results for Monica Allen, Monica Allen (MRN [id number]) as of 12/26/2014 17:48  Ref. Range 12/25/2014 04:01 12/25/2014 06:23 12/25/2014 12:09 12/25/2014 18:00 12/26/2014 00:49 12/26/2014 06:10 12/26/2014 11:48 12/26/2014 16:58  Glucose-Capillary Latest Range: 70-99 mg/dL 161 (H) 146 (H) 229 (H) 240 (H) 218 (H) 191 (H) 191 (H) 168 (H)   Current orders for Inpatient glycemic control: Lantus 8 units daily, Novolog 0-15 units Q4H  Inpatient Diabetes Program Recommendations Insulin - Basal: Patient received a total of Novolog 15 units for correction on 2/16 and has received a total of Novolog 9 units so far today for correction. Please consider increasing Lantus to 10 units daily.  Thanks, Barnie Alderman, RN, MSN, CCRN, CDE Diabetes Coordinator Inpatient Diabetes Program [phone] (Team Pager) [phone] ([facility] office) [phone] [facility] office)

## 2014-12-26 NOTE — Progress Notes (Signed)
Allen ID: Monica Allen, female   DOB: May 12, 1956, 59 y.o.   MRN: [id number]     [facility]      [address] [place], [place] [address]    Phone: [phone] FAX: [phone]     Subjective: Passing flatus.  Tolerated clears.  C/o intermittent cramping.  Objective:  Vital signs:  Filed Vitals:   12/25/14 0522 12/25/14 2140 12/26/14 0142 12/26/14 0617  BP: 155/80 156/89 140/68 147/71  Pulse: 85 79 87 88  Temp: 98.4 F (36.9 C) 98.5 F (36.9 C) 98.2 F (36.8 C) 97.7 F (36.5 C)  TempSrc: Oral Oral Oral Oral  Resp: $Remo'16 18 16 16  'osBpn$ Height:      Weight:    255 lb (115.667 kg)  SpO2: 99% 97% 95% 94%    Last BM Date: 12/24/14  Intake/Output   Yesterday:    This shift:    I/O last 3 completed shifts: In: 748.3 [I.V.:748.3] Out: 145 [Emesis/NG output:145]     Physical Exam: General: Pt awake/alert/oriented x4 in no acute distress Abdomen: Soft. Nondistended. ttp right lower quadrant. No evidence of peritonitis. No incarcerated hernias.   Problem List:   Principal Problem:   SBO (small bowel obstruction) Active Problems:   Invasive adenocarcinoma of colon   Type 2 diabetes mellitus   Essential hypertension   Chronic CHF (congestive heart failure)    Results:   Labs: Results for orders placed or performed during the hospital encounter of 12/23/14 (from the past 48 hour(s))  Glucose, capillary     Status: Abnormal   Collection Time: 12/24/14 12:28 PM  Result Value Ref Range   Glucose-Capillary 188 (H) 70 - 99 mg/dL  Glucose, capillary     Status: Abnormal   Collection Time: 12/24/14  5:51 PM  Result Value Ref Range   Glucose-Capillary 190 (H) 70 - 99 mg/dL  Glucose, capillary     Status: Abnormal   Collection Time: 12/24/14  9:48 PM  Result Value Ref Range   Glucose-Capillary 169 (H) 70 - 99 mg/dL  Glucose, capillary     Status: Abnormal   Collection Time: 12/25/14  4:01 AM  Result Value Ref Range    Glucose-Capillary 161 (H) 70 - 99 mg/dL  CBC     Status: Abnormal   Collection Time: 12/25/14  4:34 AM  Result Value Ref Range   WBC 6.4 4.0 - 10.5 K/uL   RBC 4.83 3.87 - 5.11 MIL/uL   Hemoglobin 13.0 12.0 - 15.0 g/dL   HCT 39.6 36.0 - 46.0 %   MCV 82.0 78.0 - 100.0 fL   MCH 26.9 26.0 - 34.0 pg   MCHC 32.8 30.0 - 36.0 g/dL   RDW 14.0 11.5 - 15.5 %   Platelets 146 (L) 150 - 400 K/uL  Comprehensive metabolic panel     Status: Abnormal   Collection Time: 12/25/14  4:34 AM  Result Value Ref Range   Sodium 139 135 - 145 mmol/L   Potassium 3.7 3.5 - 5.1 mmol/L   Chloride 105 96 - 112 mmol/L   CO2 24 19 - 32 mmol/L   Glucose, Bld 169 (H) 70 - 99 mg/dL   BUN 9 6 - 23 mg/dL   Creatinine, Ser 0.66 0.50 - 1.10 mg/dL   Calcium 8.1 (L) 8.4 - 10.5 mg/dL   Total Protein 6.0 6.0 - 8.3 g/dL   Albumin 2.9 (L) 3.5 - 5.2 g/dL   AST 23 0 - 37 U/L  ALT 15 0 - 35 U/L   Alkaline Phosphatase 56 39 - 117 U/L   Total Bilirubin 1.1 0.3 - 1.2 mg/dL   GFR calc non Af Amer >90 >90 mL/min   GFR calc Af Amer >90 >90 mL/min    Comment: (NOTE) The eGFR has been calculated using the CKD EPI equation. This calculation has not been validated in all clinical situations. eGFR's persistently <90 mL/min signify possible Chronic Kidney Disease.    Anion gap 10 5 - 15  Glucose, capillary     Status: Abnormal   Collection Time: 12/25/14  6:23 AM  Result Value Ref Range   Glucose-Capillary 146 (H) 70 - 99 mg/dL  Glucose, capillary     Status: Abnormal   Collection Time: 12/25/14 12:09 PM  Result Value Ref Range   Glucose-Capillary 229 (H) 70 - 99 mg/dL  Glucose, capillary     Status: Abnormal   Collection Time: 12/25/14  6:00 PM  Result Value Ref Range   Glucose-Capillary 240 (H) 70 - 99 mg/dL  Glucose, capillary     Status: Abnormal   Collection Time: 12/26/14 12:49 AM  Result Value Ref Range   Glucose-Capillary 218 (H) 70 - 99 mg/dL  Glucose, capillary     Status: Abnormal   Collection Time: 12/26/14   6:10 AM  Result Value Ref Range   Glucose-Capillary 191 (H) 70 - 99 mg/dL    Imaging / Studies: No results found.  Medications / Allergies:  Scheduled Meds: . bisacodyl  10 mg Rectal Once  . heparin  5,000 Units Subcutaneous 3 times per day  . insulin aspart  0-15 Units Subcutaneous Q6H  . insulin glargine  8 Units Subcutaneous Daily   Continuous Infusions: . 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

## 2014-12-27 LAB — GLUCOSE, CAPILLARY
Glucose-Capillary: 224 mg/dL — ABNORMAL HIGH (ref 70–99)
Glucose-Capillary: 187 mg/dL — ABNORMAL HIGH (ref 70–99)

## 2014-12-27 MED ORDER — TRAMADOL HCL 50 MG PO TABS: 50.0000 mg | ORAL_TABLET | Freq: Two times a day (BID) | ORAL | Status: DC | PRN

## 2014-12-27 MED ORDER — HYDROMORPHONE HCL 1 MG/ML IJ SOLN: 0.5000 mg | INTRAMUSCULAR | Status: DC | PRN

## 2014-12-27 MED ORDER — HEPARIN SOD (PORK) LOCK FLUSH 100 UNIT/ML IV SOLN
500.0000 [IU] | INTRAVENOUS | Status: AC | PRN
  Administered 2014-12-27: 500 [IU]

## 2014-12-27 NOTE — Progress Notes (Signed)
Patient ID: Monica Allen, female   DOB: 05/16/1956, 59 y.o.   MRN: [id number]     [facility]      [address] [place], [place] [address]    Phone: [phone] FAX: [phone]     Subjective: Denies pain.  Has several BMs yesterday after dulcolax.  Passing flatus.  Tolerated clears.   Objective:  Vital signs:  Filed Vitals:   12/26/14 0617 12/26/14 1455 12/26/14 2152 12/27/14 0621  BP: 147/71 127/72 123/71 149/90  Pulse: 88 88 95 93  Temp: 97.7 F (36.5 C) 97.6 F (36.4 C) 98.3 F (36.8 C) 97.8 F (36.6 C)  TempSrc: Oral Oral Oral Oral  Resp: 16 21 18 18   Height:      Weight: 255 lb (115.667 kg)   260 lb 1.6 oz (117.981 kg)  SpO2: 94% 96% 97% 97%    Last BM Date: 12/26/14  Intake/Output   Yesterday:  02/17 0701 - 02/18 0700 In: 600 [P.O.:600] Out: -  This shift:    I/O last 3 completed shifts: In: 600 [P.O.:600] Out: -    Physical Exam: General: Pt awake/alert/oriented x4 in no acute distress Abdomen: Soft. Nondistended. Non tender.  No evidence of peritonitis. No incarcerated hernias.    Problem List:   Principal Problem:   SBO (small bowel obstruction) Active Problems:   Invasive adenocarcinoma of colon   Type 2 diabetes mellitus   Essential hypertension   Chronic CHF (congestive heart failure)    Results:   Labs: Results for orders placed or performed during the hospital encounter of 12/23/14 (from the past 48 hour(s))  Glucose, capillary     Status: Abnormal   Collection Time: 12/25/14 12:09 PM  Result Value Ref Range   Glucose-Capillary 229 (H) 70 - 99 mg/dL  Glucose, capillary     Status: Abnormal   Collection Time: 12/25/14  6:00 PM  Result Value Ref Range   Glucose-Capillary 240 (H) 70 - 99 mg/dL  Glucose, capillary     Status: Abnormal   Collection Time: 12/26/14 12:49 AM  Result Value Ref Range   Glucose-Capillary 218 (H) 70 - 99 mg/dL  Glucose, capillary     Status: Abnormal    Collection Time: 12/26/14  6:10 AM  Result Value Ref Range   Glucose-Capillary 191 (H) 70 - 99 mg/dL  Glucose, capillary     Status: Abnormal   Collection Time: 12/26/14 11:48 AM  Result Value Ref Range   Glucose-Capillary 191 (H) 70 - 99 mg/dL  Glucose, capillary     Status: Abnormal   Collection Time: 12/26/14  4:58 PM  Result Value Ref Range   Glucose-Capillary 168 (H) 70 - 99 mg/dL   Comment 1 Documented in Char   Glucose, capillary     Status: Abnormal   Collection Time: 12/26/14 11:27 PM  Result Value Ref Range   Glucose-Capillary 202 (H) 70 - 99 mg/dL   Comment 1 Notify RN     Imaging / Studies: No results found.  Medications / Allergies:  Scheduled Meds: . heparin  5,000 Units Subcutaneous 3 times per day  . insulin aspart  0-15 Units Subcutaneous TID AC & HS  . 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   Erby Pian, [facility] Pager [phone]) For consults and floor pages call [phone]([id number]:30P)  12/27/2014 8:19 AM

## 2014-12-27 NOTE — Discharge Summary (Signed)
Physician Discharge Summary  Monica Allen CWC:[id number] DOB: 29-Apr-1956 DOA: 12/23/2014  PCP: [name]  Admit date: 12/23/2014 Discharge date: 12/27/2014  Time spent: 35 minutes  Recommendations for Outpatient Follow-up:  1. Needs bowel regimine  Discharge Diagnoses:  Principal Problem:   SBO (small bowel obstruction) Active Problems:   Invasive adenocarcinoma of colon   Type 2 diabetes mellitus   Essential hypertension   Chronic CHF (congestive heart failure)   Discharge Condition: improved  Diet recommendation: soft  Filed Weights   12/25/14 0420 12/26/14 0617 12/27/14 0621  Weight: 115.985 kg (255 lb 11.2 oz) 115.667 kg (255 lb) 117.981 kg (260 lb 1.6 oz)    History of present illness:  Monica Allen is a 59 y.o. female with Past medical history of invasive adenocarcinoma of the colon on status post partial colectomy and chemotherapy, history of cardiomyopathy last EF 55% 2009, diabetes mellitus, hypertension. The patient is presenting with complaints of abdominal pain associated with nausea and vomiting that has been ongoing for last 2-3 days. The pain has been gradually worsening and not improving. Patient did have constipation and she took a stool softener and had a bowel movement last night this was followed by one or 2 loose bowel motion as well. Patient denies any blood or black color bowel movement. Asian at the time of my evaluation does not have any nausea. Denies any fever, chills, cough, chest pain, shortness of breath. Denies any changes in her medication but since last 2 days has not been taking her blood pressure medication as well as diuretic due to ongoing abdominal pain with poor oral intake. She is also reporting her diabetes medication. Denies any rash anywhere. The patient is not on Xeloda at present for last 5 weeks.  The patient is coming from home. And at her baseline independent for most of her ADL.  Hospital Course:  SBO (small bowel  obstruction)  Improving. Soft diet  Type 2 diabetes mellitus, uncontrolled  Resume home meds  Essential hypertension/Chronic CHF (congestive heart failure) Resume home meds   Procedures:  Consultations:  surgery  Discharge Exam: Filed Vitals:   12/27/14 0621  BP: 149/90  Pulse: 93  Temp: 97.8 F (36.6 C)  Resp: 18    General: A+Ox3, NAD Cardiovascular: rrr Respiratory: clear  Discharge Instructions    Current Discharge Medication List    START taking these medications   Details  traMADol (ULTRAM) 50 MG tablet Take 1 tablet (50 mg total) by mouth every 12 (twelve) hours as needed for moderate pain. Qty: 30 tablet, Refills: 0      CONTINUE these medications which have NOT CHANGED   Details  aspirin EC 81 MG tablet Take 81 mg by mouth every morning.     carvedilol (COREG CR) 80 MG 24 hr capsule Take 80 mg by mouth daily with breakfast.     gabapentin (NEURONTIN) 300 MG capsule Take 2 capsule in the morning, 1 capsule midday, and 3 capsules at bedtime daily. Qty: 180 capsule, Refills: 5   Associated Diagnoses: Peripheral neuropathy    lidocaine-prilocaine (EMLA) cream Apply a quarter size amount to port site 1 hour prior to chemo. Do not rub in. Cover with plastic. Qty: 30 g, Refills: 3   Associated Diagnoses: Adenocarcinoma of colon    magnesium citrate SOLN Take 1 Bottle by mouth daily as needed for moderate constipation or severe constipation.    valsartan (DIOVAN) 320 MG tablet Take 320 mg by mouth daily.   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 Qty: 16 g, Refills: 2    furosemide (LASIX) 80 MG tablet Take 80 mg by mouth daily with breakfast.     glipiZIDE (GLUCOTROL) 10 MG tablet Take 10 mg by mouth daily before breakfast.    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     potassium chloride (KLOR-CON) 20 MEQ packet Take 20 mEq by mouth daily.      STOP taking these medications     capecitabine (XELODA) 500 MG tablet      HYDROcodone-acetaminophen (NORCO/VICODIN) 5-325 MG per tablet        Allergies  Allergen Reactions  . Actos [Pioglitazone] Other (See Comments)    Heart doctor told me not to take this drug.   . Ibuprofen Other (See Comments)    "made my stomach hurt"      The results of significant diagnostics from this hospitalization (including imaging, microbiology, ancillary and laboratory) are listed below for reference.    Significant Diagnostic Studies: Ct Abdomen Pelvis Wo Contrast  12/23/2014   CLINICAL DATA:  RIGHT-sided pain. Decreased stool. Nausea and vomiting. Colectomy. Colon cancer. Chemotherapy on going.  EXAM: CT ABDOMEN AND PELVIS WITHOUT CONTRAST  TECHNIQUE: Multidetector CT imaging of the abdomen and pelvis was performed following the standard protocol without IV contrast.  COMPARISON:  11/07/2014.  FINDINGS: Musculoskeletal: Lower lumbar facet arthrosis. No thoracolumbar compression fractures. There is a lesion in the medial LEFT femoral head which has gradually increased in size and lucency compared to 03/14/2014. Although this is nonspecific, this does have permeative osteolysis and could represent metastatic disease. This would be an atypical location for AVN but that is also in the differential considerations. A followup MRI of the bony pelvis with and without contrast is recommended in this patient with malignancy. Stable tiny lucency is present in the LEFT sacral ala at the S1 segment.  Lung Bases: Ground-glass attenuation is present in the RIGHT lower lobe, favored to represent atelectasis. Atypical infection is considered unlikely. No airspace consolidation. Previously seen RIGHT middle lobe pulmonary nodule is not visible.   Liver: Unenhanced CT was performed per clinician order. Lack of IV contrast limits sensitivity and specificity, especially for evaluation of abdominal/pelvic solid viscera. Grossly normal.  Spleen:  Normal.  Gallbladder: High attenuation in the gallbladder, probably representing cholelithiasis. This appears unchanged compared to the most recent CT.  Common bile duct:  Grossly normal.  Pancreas: The pancreas appears grossly normal. Lymph nodes are present around the pancreatic head both anteriorly and posteriorly. Although the is are suboptimally evaluated in the absence of contrast, they appear similar to prior with anterior peripancreatic lymph node measuring 17 mm short axis and posterior lymph node measuring 20 mm short axis.  Adrenal glands: Unchanged LEFT adrenal nodule. RIGHT adrenal normal.  Kidneys: No stones or obstruction. Both ureters appear within normal limits.  Stomach:  Normal.  Distended with contrast.  Small bowel: Duodenum appears normal. There is progressive dilation of small bowel extending into the large ventral hernia. Small bowel obstruction is present with a transition point on image number 50, series 2 at the margin of the hernia. Dilated small bowel proximal to the transition point measures 35 mm. Distal small bowel is decompressed.  Colon: Ascending colectomy  and ileocolonic anastomosis. No contrast within the colon.  Pelvic Genitourinary:  Hysterectomy.  Peritoneum: Small amount of free fluid, likely reactive to small bowel obstruction. Scarring in the RIGHT upper quadrant appears similar to prior. No free air.  Vasculature: Atherosclerosis.  No gross acute abnormality.  Body Wall: Large ventral incisional hernia.  IMPRESSION: 1. Small bowel obstruction with transition point in the RIGHT-sided margin of the large ventral hernia. Obstruction appears high-grade. 2. Ascending colectomy. 3. Stable peripancreatic lymph nodes. 4. Gallstones or sludge.  No change from prior. 5. Radiolucent  lesion in the medial LEFT femoral head has been slowly increasing over time. Metastatic disease is in the differential considerations. Follow-up nonemergent MRI of the pelvis with and without contrast is recommended for further assessment.   Electronically Signed   By: Dereck Ligas M.D.   On: 12/23/2014 16:21   Dg Abd 2 Views  12/24/2014   CLINICAL DATA:  Small bowel obstruction diagnosed yesterday. Right-sided abdominal pain. Decreased bowel movements. Nausea and vomiting. Colectomy.  EXAM: ABDOMEN - 2 VIEW  COMPARISON:  CT 12/23/2014  FINDINGS: Nasogastric tube has its tip in the gastric fundus. Contrast previously administered has now largely passed into the colon. There continue to be some dilated fluid and air-filled loops of small bowel in the central abdomen. No free air. Findings are consistent with partial small bowel obstruction. Difficult to appreciate the fact that there is a hernia based on the frontal view abdominal radiographs.  IMPRESSION: Partial small bowel obstruction. Passage of contrast into the colon. Continued loops of dilated fluid and air-filled loops of small bowel in the central abdomen.   Electronically Signed   By: Nelson Chimes M.D.   On: 12/24/2014 07:15    Microbiology: No results found for this or any previous visit (from the past 240 hour(s)).   Labs: Basic Metabolic Panel:  Recent Labs Lab 12/23/14 1355 12/25/14 0434  NA 136 139  K 4.2 3.7  CL 101 105  CO2 23 24  GLUCOSE 330* 169*  BUN 9 9  CREATININE 0.74 0.66  CALCIUM 9.1 8.1*   Liver Function Tests:  Recent Labs Lab 12/23/14 1355 12/25/14 0434  AST 22 23  ALT 18 15  ALKPHOS 71 56  BILITOT 0.9 1.1  PROT 7.4 6.0  ALBUMIN 4.1 2.9*    Recent Labs Lab 12/23/14 1355  LIPASE 17   No results for input(s): AMMONIA in the last 168 hours. CBC:  Recent Labs Lab 12/23/14 1355 12/25/14 0434  WBC 8.4 6.4  NEUTROABS 5.2  --   HGB 15.3* 13.0  HCT 45.7 39.6  MCV 82.6 82.0  PLT 190 146*    Cardiac Enzymes: No results for input(s): CKTOTAL, CKMB, CKMBINDEX, TROPONINI in the last 168 hours. BNP: BNP (last 3 results) No results for input(s): BNP in the last 8760 hours.  ProBNP (last 3 results) No results for input(s): PROBNP in the last 8760 hours.  CBG:  Recent Labs Lab 12/26/14 1148 12/26/14 1658 12/26/14 2327 12/27/14 0810 12/27/14 1221  GLUCAP 191* 168* 202* 224* 187*       Signed:  Diavion Labrador  [facility] 12/27/2014, 12:59 PM

## 2014-12-27 NOTE — Progress Notes (Signed)
Monica Allen to be D/C'd Home per MD order.  Discussed with the patient and all questions fully answered.  VSS, Skin clean, dry and intact without evidence of skin break down, no evidence of skin tears noted. Porta-cath de-accessed by IV team. No complications at site.  An After Visit Summary was printed and given to the patient. Patient received prescription.  D/c education completed with patient/family including follow up instructions, medication list, d/c activities limitations if indicated, with other d/c instructions as indicated by MD - patient able to verbalize understanding, all questions fully answered.   Patient instructed to return to ED, call 911, or call MD for any changes in condition.   Patient escorted via [place], and D/C home via private auto.  Micki Riley 12/27/2014 1:52 PM

## 2014-12-28 ENCOUNTER — Encounter (HOSPITAL_COMMUNITY): Payer: Self-pay | Admitting: *Deleted

## 2014-12-28 ENCOUNTER — Other Ambulatory Visit (HOSPITAL_COMMUNITY): Payer: Self-pay | Admitting: Hematology & Oncology

## 2014-12-28 ENCOUNTER — Emergency Department (HOSPITAL_COMMUNITY)
Admission: EM | Admit: 2014-12-28 | Discharge: 2014-12-28 | Discharge status: Against Medical Advice | Payer: BLUE CROSS/BLUE SHIELD | Attending: Emergency Medicine | Admitting: Emergency Medicine

## 2014-12-28 DIAGNOSIS — R109 Unspecified abdominal pain: Secondary | ICD-10-CM | POA: Diagnosis not present

## 2014-12-28 DIAGNOSIS — E119 Type 2 diabetes mellitus without complications: Secondary | ICD-10-CM | POA: Diagnosis not present

## 2014-12-28 DIAGNOSIS — I10 Essential (primary) hypertension: Secondary | ICD-10-CM | POA: Diagnosis not present

## 2014-12-28 MED ORDER — POLYETHYLENE GLYCOL 3350 17 GM/SCOOP PO POWD: ORAL | Status: AC

## 2014-12-28 MED ORDER — ONDANSETRON HCL 8 MG PO TABS: 8.0000 mg | ORAL_TABLET | Freq: Three times a day (TID) | ORAL | Status: AC | PRN

## 2014-12-28 MED ORDER — OXYCODONE-ACETAMINOPHEN 5-325 MG PO TABS: ORAL_TABLET | ORAL | Status: DC

## 2014-12-28 NOTE — ED Notes (Signed)
[facility] for SBO, hx of colon cancer.  After left [facility], began having increased abd pain, and pain med given -tramadol- is not relieving, n/v alert. D/C from [facility] last pm

## 2015-01-01 ENCOUNTER — Encounter (HOSPITAL_COMMUNITY): Payer: BLUE CROSS/BLUE SHIELD | Attending: Oncology | Admitting: Hematology & Oncology

## 2015-01-01 ENCOUNTER — Encounter (HOSPITAL_COMMUNITY): Payer: Self-pay | Admitting: Hematology & Oncology

## 2015-01-01 ENCOUNTER — Encounter (HOSPITAL_BASED_OUTPATIENT_CLINIC_OR_DEPARTMENT_OTHER): Payer: BLUE CROSS/BLUE SHIELD

## 2015-01-01 VITALS — BP 155/71 | HR 73 | Temp 97.8°F | Resp 18 | Wt 256.0 lb

## 2015-01-01 DIAGNOSIS — C7962 Secondary malignant neoplasm of left ovary: Secondary | ICD-10-CM

## 2015-01-01 DIAGNOSIS — C189 Malignant neoplasm of colon, unspecified: Secondary | ICD-10-CM

## 2015-01-01 DIAGNOSIS — C182 Malignant neoplasm of ascending colon: Secondary | ICD-10-CM

## 2015-01-01 DIAGNOSIS — C787 Secondary malignant neoplasm of liver and intrahepatic bile duct: Secondary | ICD-10-CM

## 2015-01-01 LAB — CBC WITH DIFFERENTIAL/PLATELET
BASOS ABS: 0 10*3/uL (ref 0.0–0.1)
BASOS PCT: 0 % (ref 0–1)
Eosinophils Absolute: 0.1 10*3/uL (ref 0.0–0.7)
Eosinophils Relative: 2 % (ref 0–5)
HCT: 39.4 % (ref 36.0–46.0)
Hemoglobin: 12.7 g/dL (ref 12.0–15.0)
Lymphocytes Relative: 36 % (ref 12–46)
Lymphs Abs: 2 10*3/uL (ref 0.7–4.0)
MCH: 26.6 pg (ref 26.0–34.0)
MCHC: 32.2 g/dL (ref 30.0–36.0)
MCV: 82.6 fL (ref 78.0–100.0)
Monocytes Absolute: 0.5 10*3/uL (ref 0.1–1.0)
Monocytes Relative: 10 % (ref 3–12)
NEUTROS PCT: 52 % (ref 43–77)
Neutro Abs: 2.9 10*3/uL (ref 1.7–7.7)
PLATELETS: 174 10*3/uL (ref 150–400)
RBC: 4.77 MIL/uL (ref 3.87–5.11)
RDW: 14 % (ref 11.5–15.5)
WBC: 5.5 10*3/uL (ref 4.0–10.5)

## 2015-01-01 LAB — COMPREHENSIVE METABOLIC PANEL
ALT: 18 U/L (ref 0–35)
ANION GAP: 6 (ref 5–15)
AST: 24 U/L (ref 0–37)
Albumin: 3.2 g/dL — ABNORMAL LOW (ref 3.5–5.2)
Alkaline Phosphatase: 58 U/L (ref 39–117)
BILIRUBIN TOTAL: 0.4 mg/dL (ref 0.3–1.2)
BUN: 6 mg/dL (ref 6–23)
CHLORIDE: 103 mmol/L (ref 96–112)
CO2: 29 mmol/L (ref 19–32)
Calcium: 8.5 mg/dL (ref 8.4–10.5)
Creatinine, Ser: 0.67 mg/dL (ref 0.50–1.10)
Glucose, Bld: 128 mg/dL — ABNORMAL HIGH (ref 70–99)
Potassium: 3.4 mmol/L — ABNORMAL LOW (ref 3.5–5.1)
Sodium: 138 mmol/L (ref 135–145)
Total Protein: 6.6 g/dL (ref 6.0–8.3)

## 2015-01-01 MED ORDER — POTASSIUM CHLORIDE 20 MEQ PO PACK: 20.0000 meq | PACK | Freq: Every day | ORAL | Status: DC

## 2015-01-01 NOTE — Progress Notes (Signed)
Labs for cbcd,cmp 

## 2015-01-01 NOTE — Patient Instructions (Addendum)
[place] at [facility] Discharge Instructions  RECOMMENDATIONS MADE BY THE CONSULTANT AND ANY TEST RESULTS WILL BE SENT TO YOUR REFERRING PHYSICIAN.  Exam and discussion by MD.  Plans are to treat you with a different agent. We will start you on Vectibix next week.  Side effects discussed by Dr. Whitney Muse.  Will start chemo next week and see you back the following week to see how you are doing.    Thank you for choosing [place] at [facility] to provide your oncology and hematology care.  To afford each patient quality time with our provider, please arrive at least 15 minutes before your scheduled appointment time.    You need to re-schedule your appointment should you arrive 10 or more minutes late.  We strive to give you quality time with our providers, and arriving late affects you and other patients whose appointments are after yours.  Also, if you no show three or more times for appointments you may be dismissed from the clinic at the providers discretion.     Again, thank you for choosing [facility].  Our hope is that these requests will decrease the amount of time that you wait before being seen by our physicians.       _____________________________________________________________  Should you have questions after your visit to [facility], please contact our office at [phone] between the hours of 8:30 a.m. and 4:30 p.m.  Voicemails left after 4:30 p.m. will not be returned until the following business day.  For prescription refill requests, have your pharmacy contact our office.    Panitumumab Solution for Injection What is this medicine? PANITUMUMAB (pan i TOOM ue mab) is a chemotherapy drug. It targets a specific protein within cancer cells and stops the cells from growing. It is used to treat colorectal cancer. This medicine may be used for other purposes; ask your health care provider or pharmacist  if you have questions. COMMON BRAND NAME(S): Vectibix What should I tell my health care provider before I take this medicine? They need to know if you have any of these conditions: -lung disease, especially lung fibrosis -skin conditions or sensitivity -an unusual or allergic reaction to panitumumab, mouse proteins, other medicines, foods, dyes, or preservatives -pregnant or trying to get pregnant -breast-feeding How should I use this medicine? This drug is given as an infusion into a vein. It is administered in a hospital or clinic by a specially trained health care professional. Talk to your pediatrician regarding the use of this medicine in children. Special care may be needed. Overdosage: If you think you have taken too much of this medicine contact a poison control center or emergency room at once. NOTE: This medicine is only for you. Do not share this medicine with others. What if I miss a dose? It is important not to miss your dose. Call your doctor or health care professional if you are unable to keep an appointment. What may interact with this medicine? -some medicines for cancer This list may not describe all possible interactions. Give your health care provider a list of all the medicines, herbs, non-prescription drugs, or dietary supplements you use. Also tell them if you smoke, drink alcohol, or use illegal drugs. Some items may interact with your medicine. What should I watch for while using this medicine? Visit your doctor for checks on your progress. This drug may make you feel generally unwell. This is not uncommon,  as chemotherapy can affect healthy cells as well as cancer cells. Report any side effects. Continue your course of treatment even though you feel ill unless your doctor tells you to stop. This medicine can make you more sensitive to the sun. Keep out of the sun while receiving this medicine and for 2 months after the last dose. If you cannot avoid being in the sun,  wear protective clothing and use sunscreen. Do not use sun lamps or tanning beds/booths. In some cases, you may be given additional medicines to help with side effects. Follow all directions for their use. Call your doctor or health care professional for advice if you get a fever, chills or sore throat, or other symptoms of a cold or flu. Do not treat yourself. This drug decreases your body's ability to fight infections. Try to avoid being around people who are sick. Avoid taking products that contain aspirin, acetaminophen, ibuprofen, naproxen, or ketoprofen unless instructed by your doctor. These medicines may hide a fever. Do not become pregnant while taking this medicine and for 6 months after the last dose. Women should inform their doctor if they wish to become pregnant or think they might be pregnant. Men should not father a child while taking this medicine and for 6 months after the last dose. There is a potential for serious side effects to an unborn child. Talk to your health care professional or pharmacist for more information. Do not breast-feed an infant while taking this medicine. What side effects may I notice from receiving this medicine? Side effects that you should report to your doctor or health care professional as soon as possible: -allergic reactions like skin rash, itching or hives, swelling of the face, lips, or tongue -breathing problems -changes in vision -fast, irregular heartbeat -feeling faint or lightheaded, falls -fever, chills -mouth sores -swelling of the ankles, feet, hands -unusually weak or tired Side effects that usually do not require medical attention (report to your doctor or health care professional if they continue or are bothersome): -changes in skin like acne, cracks, skin dryness -constipation -diarrhea -eyelash growth -headache -nail changes -nausea, vomiting -stomach upset This list may not describe all possible side effects. Call your doctor  for medical advice about side effects. You may report side effects to FDA at 1-800-FDA-1088. Where should I keep my medicine? This drug is given in a hospital or clinic and will not be stored at home. NOTE: This sheet is a summary. It may not cover all possible information. If you have questions about this medicine, talk to your doctor, pharmacist, or health care provider.  2015, [employer]vier/[employer] Standard. (2011-03-24 11:00:12)

## 2015-01-02 NOTE — Progress Notes (Signed)
Consent signed for Vectibix. Teaching done by Dr. Whitney Muse.

## 2015-01-04 ENCOUNTER — Encounter: Payer: Self-pay | Admitting: Dietician

## 2015-01-04 NOTE — Progress Notes (Signed)
Patient identified to be at risk for malnutrition on the MST secondary to wt loss and poor intake  Contacted Pt by Phone  Wt Readings from Last 10 Encounters:  01/01/15 256 lb (116.121 kg)  12/28/14 260 lb (117.935 kg)  12/27/14 260 lb 1.6 oz (117.981 kg)  11/14/14 266 lb 4.8 oz (120.793 kg)  10/24/14 268 lb (121.564 kg)  10/03/14 267 lb 9.6 oz (121.383 kg)  09/12/14 270 lb 12.8 oz (122.834 kg)  08/24/14 270 lb (122.471 kg)  08/01/14 269 lb (122.018 kg)  07/11/14 266 lb (120.657 kg)     Patient weight has decreased by ~12#s in last 3 months  Patient reports oral intake as improving. She reports no n/v or c/d. She is eating smaller meals now and and is tolerating it ok. She states that she has been drinking "Special K shakes". When talking about her weight loss, she said that she is very happy she is losing some weight and wants to lose more.   Discussed with pt that healthy weight loss (1-2#s a week) is ok for her, but to focus on maintaining her lean body mass, by ensuring she is eating enough protein. Discussed higher protein foods and how drinking an ensure/boost as a meal replacement can ensure she is getting enough protein with each meal.  Pt seemed to be very receptive/thankful for the call.  Mailed my contact info, coupons, and handouts titled "Increasing Calories and Protein"   Burtis Junes RD, LDN Nutrition Pager: [phone] 01/04/2015 9:54 AM

## 2015-01-07 ENCOUNTER — Encounter (HOSPITAL_BASED_OUTPATIENT_CLINIC_OR_DEPARTMENT_OTHER): Payer: BLUE CROSS/BLUE SHIELD

## 2015-01-07 ENCOUNTER — Other Ambulatory Visit (HOSPITAL_COMMUNITY): Payer: Self-pay | Admitting: Oncology

## 2015-01-07 ENCOUNTER — Encounter (HOSPITAL_COMMUNITY): Payer: Self-pay

## 2015-01-07 DIAGNOSIS — C189 Malignant neoplasm of colon, unspecified: Secondary | ICD-10-CM | POA: Diagnosis not present

## 2015-01-07 DIAGNOSIS — C787 Secondary malignant neoplasm of liver and intrahepatic bile duct: Secondary | ICD-10-CM

## 2015-01-07 DIAGNOSIS — Z5112 Encounter for antineoplastic immunotherapy: Secondary | ICD-10-CM

## 2015-01-07 DIAGNOSIS — C182 Malignant neoplasm of ascending colon: Secondary | ICD-10-CM

## 2015-01-07 DIAGNOSIS — D509 Iron deficiency anemia, unspecified: Secondary | ICD-10-CM

## 2015-01-07 LAB — CBC WITH DIFFERENTIAL/PLATELET
BASOS ABS: 0 10*3/uL (ref 0.0–0.1)
BASOS PCT: 0 % (ref 0–1)
EOS ABS: 0.2 10*3/uL (ref 0.0–0.7)
Eosinophils Relative: 3 % (ref 0–5)
HCT: 41.1 % (ref 36.0–46.0)
Hemoglobin: 13.3 g/dL (ref 12.0–15.0)
Lymphocytes Relative: 27 % (ref 12–46)
Lymphs Abs: 1.5 10*3/uL (ref 0.7–4.0)
MCH: 26.6 pg (ref 26.0–34.0)
MCHC: 32.4 g/dL (ref 30.0–36.0)
MCV: 82.2 fL (ref 78.0–100.0)
Monocytes Absolute: 0.5 10*3/uL (ref 0.1–1.0)
Monocytes Relative: 9 % (ref 3–12)
NEUTROS ABS: 3.5 10*3/uL (ref 1.7–7.7)
NEUTROS PCT: 61 % (ref 43–77)
Platelets: 200 10*3/uL (ref 150–400)
RBC: 5 MIL/uL (ref 3.87–5.11)
RDW: 14.5 % (ref 11.5–15.5)
WBC: 5.7 10*3/uL (ref 4.0–10.5)

## 2015-01-07 LAB — FERRITIN: Ferritin: 164 ng/mL (ref 10–291)

## 2015-01-07 LAB — IRON AND TIBC
IRON: 48 ug/dL (ref 42–145)
Saturation Ratios: 16 % — ABNORMAL LOW (ref 20–55)
TIBC: 305 ug/dL (ref 250–470)
UIBC: 257 ug/dL (ref 125–400)

## 2015-01-07 LAB — VITAMIN B12: VITAMIN B 12: 623 pg/mL (ref 211–911)

## 2015-01-07 LAB — MAGNESIUM: MAGNESIUM: 1.6 mg/dL (ref 1.5–2.5)

## 2015-01-07 MED ORDER — MAGNESIUM OXIDE 400 MG PO TABS: 400.0000 mg | ORAL_TABLET | Freq: Every day | ORAL | Status: DC

## 2015-01-07 MED ORDER — HEPARIN SOD (PORK) LOCK FLUSH 100 UNIT/ML IV SOLN
500.0000 [IU] | Freq: Once | INTRAVENOUS | Status: AC | PRN
  Administered 2015-01-07: 500 [IU]
  Filled 2015-01-07: qty 5

## 2015-01-07 MED ORDER — SODIUM CHLORIDE 0.9 % IV SOLN
Freq: Once | INTRAVENOUS | Status: AC
  Administered 2015-01-07: 11:00:00 via INTRAVENOUS

## 2015-01-07 MED ORDER — SODIUM CHLORIDE 0.9 % IV SOLN
6.0000 mg/kg | Freq: Once | INTRAVENOUS | Status: AC
  Administered 2015-01-07: 700 mg via INTRAVENOUS
  Filled 2015-01-07: qty 35

## 2015-01-07 MED ORDER — SODIUM CHLORIDE 0.9 % IJ SOLN: 10.0000 mL | INTRAMUSCULAR | Status: DC | PRN

## 2015-01-07 NOTE — Patient Instructions (Addendum)
[facility] Discharge Instructions for Patients Receiving Chemotherapy  Today you received the following chemotherapy agents vectibix We are going to send a prescription of magnesium to your pharmacy.  Follow up as scheduled.  If you have any questions or concerns please call the clinic.  To help prevent nausea and vomiting after your treatment, we encourage you to take your nausea medication    If you develop nausea and vomiting that is not controlled by your nausea medication, call the clinic. If it is after clinic hours your family physician or the after hours number for the clinic or go to the Emergency Department.   BELOW ARE SYMPTOMS THAT SHOULD BE REPORTED IMMEDIATELY:  *FEVER GREATER THAN 101.0 F  *CHILLS WITH OR WITHOUT FEVER  NAUSEA AND VOMITING THAT IS NOT CONTROLLED WITH YOUR NAUSEA MEDICATION  *UNUSUAL SHORTNESS OF BREATH  *UNUSUAL BRUISING OR BLEEDING  TENDERNESS IN MOUTH AND THROAT WITH OR WITHOUT PRESENCE OF ULCERS  *URINARY PROBLEMS  *BOWEL PROBLEMS  UNUSUAL RASH Items with * indicate a potential emergency and should be followed up as soon as possible.  One of the nurses will contact you 24 hours after your treatment. Please let the nurse know about any problems that you may have experienced. Feel free to call the clinic you have any questions or concerns. The clinic phone number is [phone].   I have been informed and understand all the instructions given to me. I know to contact the clinic, my physician, or go to the Emergency Department if any problems should occur. I do not have any questions at this time, but understand that I may call the clinic during office hours or the Patient Navigator at [phone] should I have any questions or need assistance in obtaining follow up care.    Panitumumab Solution for Injection What is this medicine? PANITUMUMAB (pan i TOOM ue mab) is a chemotherapy drug. It targets a specific protein within  cancer cells and stops the cells from growing. It is used to treat colorectal cancer. This medicine may be used for other purposes; ask your health care provider or pharmacist if you have questions. COMMON BRAND NAME(S): Vectibix What should I tell my health care provider before I take this medicine? They need to know if you have any of these conditions: -lung disease, especially lung fibrosis -skin conditions or sensitivity -an unusual or allergic reaction to panitumumab, mouse proteins, other medicines, foods, dyes, or preservatives -pregnant or trying to get pregnant -breast-feeding How should I use this medicine? This drug is given as an infusion into a vein. It is administered in a hospital or clinic by a specially trained health care professional. Talk to your pediatrician regarding the use of this medicine in children. Special care may be needed. Overdosage: If you think you have taken too much of this medicine contact a poison control center or emergency room at once. NOTE: This medicine is only for you. Do not share this medicine with others. What if I miss a dose? It is important not to miss your dose. Call your doctor or health care professional if you are unable to keep an appointment. What may interact with this medicine? -some medicines for cancer This list may not describe all possible interactions. Give your health care provider a list of all the medicines, herbs, non-prescription drugs, or dietary supplements you use. Also tell them if you smoke, drink alcohol, or use illegal drugs. Some items may interact with your medicine. What should  I watch for while using this medicine? Visit your doctor for checks on your progress. This drug may make you feel generally unwell. This is not uncommon, as chemotherapy can affect healthy cells as well as cancer cells. Report any side effects. Continue your course of treatment even though you feel ill unless your doctor tells you to stop. This  medicine can make you more sensitive to the sun. Keep out of the sun while receiving this medicine and for 2 months after the last dose. If you cannot avoid being in the sun, wear protective clothing and use sunscreen. Do not use sun lamps or tanning beds/booths. In some cases, you may be given additional medicines to help with side effects. Follow all directions for their use. Call your doctor or health care professional for advice if you get a fever, chills or sore throat, or other symptoms of a cold or flu. Do not treat yourself. This drug decreases your body's ability to fight infections. Try to avoid being around people who are sick. Avoid taking products that contain aspirin, acetaminophen, ibuprofen, naproxen, or ketoprofen unless instructed by your doctor. These medicines may hide a fever. Do not become pregnant while taking this medicine and for 6 months after the last dose. Women should inform their doctor if they wish to become pregnant or think they might be pregnant. Men should not father a child while taking this medicine and for 6 months after the last dose. There is a potential for serious side effects to an unborn child. Talk to your health care professional or pharmacist for more information. Do not breast-feed an infant while taking this medicine. What side effects may I notice from receiving this medicine? Side effects that you should report to your doctor or health care professional as soon as possible: -allergic reactions like skin rash, itching or hives, swelling of the face, lips, or tongue -breathing problems -changes in vision -fast, irregular heartbeat -feeling faint or lightheaded, falls -fever, chills -mouth sores -swelling of the ankles, feet, hands -unusually weak or tired Side effects that usually do not require medical attention (report to your doctor or health care professional if they continue or are bothersome): -changes in skin like acne, cracks, skin  dryness -constipation -diarrhea -eyelash growth -headache -nail changes -nausea, vomiting -stomach upset This list may not describe all possible side effects. Call your doctor for medical advice about side effects. You may report side effects to FDA at 1-800-FDA-1088. Where should I keep my medicine? This drug is given in a hospital or clinic and will not be stored at home. NOTE: This sheet is a summary. It may not cover all possible information. If you have questions about this medicine, talk to your doctor, pharmacist, or health care provider.  2015, Elsevier/Gold Standard. (2011-03-24 11:00:12)

## 2015-01-07 NOTE — Progress Notes (Signed)
Monica Allen magnesium level 1.6.  Dr Whitney Muse notified.  Will prescribe some PO magnesium for her  Monica Allen tolerated chemotherapy today.  Discharged ambulatory

## 2015-01-08 ENCOUNTER — Telehealth (HOSPITAL_COMMUNITY): Payer: Self-pay | Admitting: *Deleted

## 2015-01-08 NOTE — Telephone Encounter (Signed)
Patient states she tolerated treatment well. No side-effects

## 2015-01-15 ENCOUNTER — Encounter (HOSPITAL_COMMUNITY): Payer: BLUE CROSS/BLUE SHIELD | Attending: Oncology | Admitting: Hematology & Oncology

## 2015-01-15 ENCOUNTER — Encounter (HOSPITAL_COMMUNITY): Payer: Self-pay | Admitting: Hematology & Oncology

## 2015-01-15 VITALS — BP 145/65 | HR 59 | Resp 16 | Wt 247.1 lb

## 2015-01-15 DIAGNOSIS — C189 Malignant neoplasm of colon, unspecified: Secondary | ICD-10-CM | POA: Insufficient documentation

## 2015-01-15 DIAGNOSIS — C787 Secondary malignant neoplasm of liver and intrahepatic bile duct: Secondary | ICD-10-CM

## 2015-01-15 DIAGNOSIS — C7962 Secondary malignant neoplasm of left ovary: Secondary | ICD-10-CM

## 2015-01-15 DIAGNOSIS — C182 Malignant neoplasm of ascending colon: Secondary | ICD-10-CM

## 2015-01-15 MED ORDER — LIDOCAINE-PRILOCAINE 2.5-2.5 % EX CREA: TOPICAL_CREAM | CUTANEOUS | Status: AC

## 2015-01-15 NOTE — Patient Instructions (Addendum)
Monica Allen at [facility] Discharge Instructions  RECOMMENDATIONS MADE BY THE CONSULTANT AND ANY TEST RESULTS WILL BE SENT TO YOUR REFERRING PHYSICIAN.  Exam and discussion by Dr. Whitney Muse.   Emla cream - prescription e-scribed Will get moisturizer list from [facility]  Nail and Skin Changes Panitumumab has some unique nail and skin side effects that you may develop. Patients may develop a rash. While this rash may look like acne, it is not, and should not be treated with acne medications. The rash may appear red, swollen, crusty, dry and feel sore. You may also develop very dry skin, which may crack, be itchy or become flaky or scaly. The rash typically starts in the first week of treatment, but can occur at any time during treatment. Be sure to notify your provider of any skin changes you develop, as these can become severe.  Tips for managing your skin include: Use a thick, alcohol-free emollient lotion or cream on your skin at least twice a day, including right after bathing. (Udder Cream is excellent) Avoid sun exposure, as it can worsen the rash or cause a severe burn. Use a sunscreen with an SPF of 30 or higher and wear a hat and sunglasses to protect your head and face from the sun.  Maintain sun safety precautions for at least 2 months after the last dose.  Bathe/shower in cool or lukewarm (not hot) water and pat your skin dry.  Use soaps, lotions and laundry detergents without alcohol, perfumes or dyes.  Wear gloves to wash dishes or do housework or gardening.  Drink plenty of water and try not to scratch or rub your skin.  Notify your healthcare team if you develop a rash, as they can provide suggestions to manage the rash and/or prescribe a topical medication to apply to the rash or an oral medication.  If you develop peeling or blistering of the skin, notify your healthcare team right away.   Call with any concerns.  Chemotherapy as scheduled Follow-up  prior to 3rd cycle of chemotherapy.  Thank you for choosing [facility] to provide your oncology and hematology care.  To afford each patient quality time with our provider, please arrive at least 15 minutes before your scheduled appointment time.    You need to re-schedule your appointment should you arrive 10 or more minutes late.  We strive to give you quality time with our providers, and arriving late affects you and other patients whose appointments are after yours.  Also, if you no show three or more times for appointments you may be dismissed from the clinic at the providers discretion.     Again, thank you for choosing [facility].  Our hope is that these requests will decrease the amount of time that you wait before being seen by our physicians.       _____________________________________________________________  Should you have questions after your visit to [facility], please contact our office at [phone] between the hours of 8:30 a.m. and 4:30 p.m.  Voicemails left after 4:30 p.m. will not be returned until the following business day.  For prescription refill requests, have your pharmacy contact our office.

## 2015-01-15 NOTE — Progress Notes (Signed)
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 Aldean Ast on 12/13/2012. At that time he did TAH and BSO with lysis of adhesions, partial omentectomy, and resection of pelvic peritoneal implant. At the completion of the surgical procedure there is no evidence of residual disease. Her CEA is within the normal range. Her cancer however did not mark as ovarian in origin and a subsequent colonoscopy revealed a right ascending colon cancer. This was discovered by Dr. Trinda Pascal on 02/14/2013. It was near circumferential.   Kras Wildtype  CEA not elevated  CURRENT THERAPY: Panitumumab  INTERVAL HISTORY: Monica Allen 59 y.o. female returns for follow-up of her stage IV colon cancer. She is doing very well. She received her first cycle of treatment with Vectibux. She has no major complaints. She describes her energy is very good. She also describes her mood is very good. She has a very mild facial rash. She understands she is to keep the rash moisturized and also understands she needs to use sunscreen. He is had no further problems with abdominal pain, nausea or vomiting. She is eating well.  MEDICAL HISTORY: Past Medical History  Diagnosis Date  . Diabetes mellitus   . Hypertension   . Enlarged heart   . Goiter   . Nonischemic cardiomyopathy   . Anemia   . Abdominal hernia   . 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 Invasive adenocarcinoma of colon 03/08/2013    11/19 LNs POS, THRU TO ADIPOSE TISSUE    has THYROMEGALY; DIABETES MELLITUS, UNCONTROLLED, WITH RENAL COMPLICATIONS; ANXIETY; DEPRESSION; HYPERTENSION; CARDIOMYOPATHY, DILATED; CONGESTIVE HEART FAILURE; SINUSITIS, CHRONIC; ALLERGIC RHINITIS; GERD; ONYCHIA AND PARONYCHIA OF TOE; LOW BACK PAIN; Diabetes mellitus; Invasive adenocarcinoma of colon; SBO (small bowel obstruction); Type 2  diabetes mellitus; Essential hypertension; and Chronic CHF (congestive heart failure) on her problem list.      Invasive adenocarcinoma of colon   01/02/2013 Initial Diagnosis Left ovary and fallopian tube is positive for invasive adenocarcinoma with LVI and positive pelvis biopsy    02/14/2013 Procedure Ascending colon biopsy revealing invasive adenocarcinoma.  KRAS wildtype   03/01/2013 Surgery Left segmental resection showing invasive adenocarcinoma through the muscularis propria into the pericolonoc fat involving the serosa with LVI.  Clear margins.  11/19 positive lymph nodes for disease   04/04/2013 - 09/12/2013 Chemotherapy FOLFOX + Avastin x 12 cycles   09/26/2013 Imaging CT CAP- No definite signs of metastatic disease in the chest, abdomen or pelvis   10/02/2013 - 10/24/2014 Chemotherapy Avastin + Xeloda maintenance.  Avstin dose decreased to 7.5 mg/kg on 01/07/2014 due to poorly controlled HTN.   12/21/2013 Imaging CT CAP- Negative for recurrent or metastatic disease in the chest, abdomen or pelvis.    03/14/2014 Imaging CT CAP- No evidence of thoracic metastasis. No evidence metastasis within the abdomen or pelvis.   07/25/2014 Imaging CT CAP-he slight increase in mediastinal and peripancreatic lymph nodes with a new nodule in the right middle lobe most compatible with inflammatory processes.   11/07/2014 Imaging Mildly increased lymphadenopathy within the chest and abdomen, consistent with metastatic disease.Increased size of right middle lobe pulmonary nodule.Stable multinodular goiter and large anterior abdominal wall hernia.   12/23/2014 - 12/27/2014 Hospital Admission SBO, not felt to be "malignancy related" but secondary to chronically incarcerated ventral incisional hernia   01/07/2015 -  Chemotherapy Panitumumab started  is allergic to actos and ibuprofen.  Monica Allen had no medications administered during this visit.  SURGICAL HISTORY: Past Surgical History  Procedure Laterality Date    . Wisdom tooth extraction    . Laparotomy  12/13/2012    Procedure: EXPLORATORY LAPAROTOMY;  Surgeon: Alvino Chapel, MD;  Location: [facility];  Service: Gynecology;  Laterality: N/A;  . Abdominal hysterectomy  12/13/2012    Procedure: HYSTERECTOMY ABDOMINAL;  Surgeon: Alvino Chapel, MD;  Location: [facility];  Service: Gynecology;  Laterality: N/A;  . Salpingoophorectomy  12/13/2012    Procedure: SALPINGO OOPHORECTOMY;  Surgeon: Alvino Chapel, MD;  Location: [facility];  Service: Gynecology;  Laterality: Bilateral;  . Omentectomy  12/13/2012    Procedure: OMENTECTOMY;  Surgeon: Alvino Chapel, MD;  Location: [facility];  Service: Gynecology;  Laterality: N/A;  . Colonoscopy N/A 02/14/2013    Procedure: COLONOSCOPY;  Surgeon: Danie Binder, MD;  Location: [facility];  Service: Endoscopy;  Laterality: N/A;  2:00 PM-moved to [place] notified pt  . Partial colectomy N/A 03/01/2013    Procedure: PARTIAL COLECTOMY;  Surgeon: Jamesetta So, MD;  Location: [facility];  Service: General;  Laterality: N/A;  . Portacath placement Left 03/01/2013    Procedure: INSERTION PORT-A-CATH;  Surgeon: Jamesetta So, MD;  Location: [facility];  Service: General;  Laterality: Left;    SOCIAL HISTORY: History   Social History  . Marital Status: Married    Spouse Name: N/A  . Number of Children: N/A  . Years of Education: N/A   Occupational History  . self-employed     childcare provider   Social History Main Topics  . Smoking status: Former Smoker -- 1.50 packs/day for 25 years    Types: Cigarettes    Quit date: 12/10/1987  . Smokeless tobacco: Never Used     Comment: quit 22 years ago  . Alcohol Use: No     Comment: last use x1 month.  . Drug Use: No  . Sexual Activity: Yes    Birth Control/ Protection: Surgical   Other Topics Concern  . Not on file   Social History Narrative    FAMILY HISTORY: Family History  Problem Relation Age of Onset  . Diabetes Mother   . Hypertension  Mother   . Diabetes Sister   . Hypertension Sister   . Hypertension Brother   . Stroke Maternal Aunt   . Diabetes Maternal Grandmother   . Hypertension Maternal Grandmother   . Colon cancer Neg Hx     Review of Systems  Constitutional: Negative for fever, chills, weight loss and malaise/fatigue.  HENT: Negative for congestion, hearing loss, nosebleeds, sore throat and tinnitus.   Eyes: Negative for blurred vision, double vision, pain and discharge.  Respiratory: Negative for cough, hemoptysis, sputum production, shortness of breath and wheezing.   Cardiovascular: Negative for chest pain, palpitations, claudication, leg swelling and PND.  Gastrointestinal: Negative for heartburn, nausea, vomiting, abdominal pain, diarrhea, constipation, blood in stool and melena.  Genitourinary: Negative for dysuria, urgency, frequency and hematuria.  Musculoskeletal: Negative for myalgias, joint pain and falls.  Skin: Positive for rash. Negative for itching.  Neurological: Negative for dizziness, tingling, tremors, sensory change, speech change, focal weakness, seizures, loss of consciousness, weakness and headaches.  Endo/Heme/Allergies: Does not bruise/bleed easily.  Psychiatric/Behavioral: Negative for depression, suicidal ideas, memory loss and substance abuse. The patient is not nervous/anxious and does not have insomnia.     PHYSICAL EXAMINATION  ECOG PERFORMANCE STATUS:  1 - Symptomatic but completely ambulatory  Filed Vitals:   01/15/15 1100  BP: 145/65  Pulse: 59  Resp: 16    Physical Exam  Constitutional: She is oriented to person, place, and time and well-developed, well-nourished, and in no distress.  Obese  HENT:  Head: Normocephalic and atraumatic.  Nose: Nose normal.  Mouth/Throat: Oropharynx is clear and moist. No oropharyngeal exudate.  Eyes: Conjunctivae and EOM are normal. Pupils are equal, round, and reactive to light. Right eye exhibits no discharge. Left eye exhibits no  discharge. No scleral icterus.  Neck: Normal range of motion. Neck supple. No tracheal deviation present. No thyromegaly present.  Cardiovascular: Normal rate, regular rhythm and normal heart sounds.  Exam reveals no gallop and no friction rub.   No murmur heard. Pulmonary/Chest: Effort normal and breath sounds normal. She has no wheezes. She has no rales.  Abdominal: Soft. Bowel sounds are normal. She exhibits no distension and no mass. There is no tenderness. There is no rebound and no guarding.  Musculoskeletal: Normal range of motion. She exhibits no edema.  Lymphadenopathy:    She has no cervical adenopathy.  Neurological: She is alert and oriented to person, place, and time. She has normal reflexes. No cranial nerve deficit. Gait normal. Coordination normal.  Skin: Skin is warm and dry. Rash noted.  Very minor rash across nose  Psychiatric: Mood, memory, affect and judgment normal.  Nursing note and vitals reviewed.   LABORATORY DATA:  CBC    Component Value Date/Time   WBC 5.7 01/07/2015 1037   RBC 5.00 01/07/2015 1037   HGB 13.3 01/07/2015 1037   HCT 41.1 01/07/2015 1037   PLT 200 01/07/2015 1037   MCV 82.2 01/07/2015 1037   MCH 26.6 01/07/2015 1037   MCHC 32.4 01/07/2015 1037   RDW 14.5 01/07/2015 1037   LYMPHSABS 1.5 01/07/2015 1037   MONOABS 0.5 01/07/2015 1037   EOSABS 0.2 01/07/2015 1037   BASOSABS 0.0 01/07/2015 1037   CMP     Component Value Date/Time   NA 138 01/01/2015 0951   K 3.4* 01/01/2015 0951   CL 103 01/01/2015 0951   CO2 29 01/01/2015 0951   GLUCOSE 128* 01/01/2015 0951   BUN 6 01/01/2015 0951   CREATININE 0.67 01/01/2015 0951   CALCIUM 8.5 01/01/2015 0951   PROT 6.6 01/01/2015 0951   ALBUMIN 3.2* 01/01/2015 0951   AST 24 01/01/2015 0951   ALT 18 01/01/2015 0951   ALKPHOS 58 01/01/2015 0951   BILITOT 0.4 01/01/2015 0951   GFRNONAA >90 01/01/2015 0951   GFRAA >90 01/01/2015 0951     RADIOGRAPHIC STUDIES: CLINICAL DATA: RIGHT-sided  pain. Decreased stool. Nausea and vomiting. Colectomy. Colon cancer. Chemotherapy on going.  EXAM: CT ABDOMEN AND PELVIS WITHOUT CONTRAST  TECHNIQUE: Multidetector CT imaging of the abdomen and pelvis was performed following the standard protocol without IV contrast.  COMPARISON: 11/07/2014. IMPRESSION: 1. Small bowel obstruction with transition point in the RIGHT-sided margin of the large ventral hernia. Obstruction appears high-grade. 2. Ascending colectomy. 3. Stable peripancreatic lymph nodes. 4. Gallstones or sludge. No change from prior. 5. Radiolucent lesion in the medial LEFT femoral head has been slowly increasing over time. Metastatic disease is in the differential considerations. Follow-up nonemergent MRI of the pelvis with and without contrast is recommended for further assessment.   Electronically Signed  By: Dereck Ligas M.D.  On: 12/23/2014 16:21  ASSESSMENT and THERAPY PLAN:    Invasive adenocarcinoma of colon 59 year old female with stage IV colon  cancer currently on single agent Vectibux. She has done well with her first treatment. We again reviewed the side effects of therapy and care of her skin. She will come back next week for her next cycle, we will see her back prior to cycle 3. We will continue close monitoring of magnesium proceeding forward. I reviewed her CT scans during her hospital stay and the findings in the left femoral head are noted, I will discuss with radiology whether a bone scan may be sufficient. If bone scan will not offer adequate evaluation of the left femoral head we can consider MRI. I will discuss this with Adine at follow-up. Note that she is currently asymptomatic from this imaging finding.   All questions were answered. The patient knows to call the clinic with any problems, questions or concerns. We can certainly see the patient much sooner if necessary.  Molli Hazard 01/15/2015

## 2015-01-15 NOTE — Assessment & Plan Note (Signed)
59 year old female with stage IV colon cancer currently on single agent Vectibux. She has done well with her first treatment. We again reviewed the side effects of therapy and care of her skin. She will come back next week for her next cycle, we will see her back prior to cycle 3. We will continue close monitoring of magnesium proceeding forward. I reviewed her CT scans during her hospital stay and the findings in the left femoral head are noted, I will discuss with radiology whether a bone scan may be sufficient. If bone scan will not offer adequate evaluation of the left femoral head we can consider MRI. I will discuss this with Jaki at follow-up. Note that she is currently asymptomatic from this imaging finding.

## 2015-01-21 ENCOUNTER — Inpatient Hospital Stay (HOSPITAL_COMMUNITY): Payer: BLUE CROSS/BLUE SHIELD

## 2015-01-22 NOTE — Progress Notes (Signed)
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 Loree Fee on 12/13/2012. At that time he did TAH and BSO with lysis of adhesions, partial omentectomy, and resection of pelvic peritoneal implant. At the completion of the surgical procedure there is no evidence of residual disease. Her CEA is within the normal range. Her cancer however did not mark as ovarian in origin and a subsequent colonoscopy revealed a right ascending colon cancer. This was discovered by Dr. Raj Janus on 02/14/2013. It was near circumferential.   Kras Wildtype  CEA not elevated  CURRENT THERAPY:observation  INTERVAL HISTORY: Monica Allen 59 y.o. female returns for follow-up of her stage IV colon cancer. She just had a hospital admission for small bowel obstruction that was felt to be secondary to a hernia. She is on a chemotherapy holiday and we were going to wait a little longer to restart therapy. Her hospitalization has made her want to try treatment sooner. She says a few weeks off from treatment have made an improvement in her overall well-being. She denies any abdominal pain. She has no nausea, no vomiting, and realistically no major complaints today.   MEDICAL HISTORY: Past Medical History  Diagnosis Date  . Diabetes mellitus   . Hypertension   . Enlarged heart   . Goiter   . Nonischemic cardiomyopathy   . Anemia   . Abdominal hernia   . 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 Invasive adenocarcinoma of colon 03/08/2013    11/19 LNs POS, THRU TO ADIPOSE TISSUE    has THYROMEGALY; DIABETES MELLITUS, UNCONTROLLED, WITH RENAL COMPLICATIONS; ANXIETY; DEPRESSION; HYPERTENSION; CARDIOMYOPATHY, DILATED; CONGESTIVE HEART FAILURE; SINUSITIS, CHRONIC; ALLERGIC RHINITIS; GERD; ONYCHIA AND PARONYCHIA OF TOE; LOW BACK PAIN; Diabetes mellitus; Invasive adenocarcinoma of  colon; SBO (small bowel obstruction); Type 2 diabetes mellitus; Essential hypertension; and Chronic CHF (congestive heart failure) on her problem list.      Invasive adenocarcinoma of colon   01/02/2013 Initial Diagnosis Left ovary and fallopian tube is positive for invasive adenocarcinoma with LVI and positive pelvis biopsy    02/14/2013 Procedure Ascending colon biopsy revealing invasive adenocarcinoma.  KRAS wildtype   03/01/2013 Surgery Left segmental resection showing invasive adenocarcinoma through the muscularis propria into the pericolonoc fat involving the serosa with LVI.  Clear margins.  11/19 positive lymph nodes for disease   04/04/2013 - 09/12/2013 Chemotherapy FOLFOX + Avastin x 12 cycles   09/26/2013 Imaging CT CAP- No definite signs of metastatic disease in the chest, abdomen or pelvis   10/02/2013 - 10/24/2014 Chemotherapy Avastin + Xeloda maintenance.  Avstin dose decreased to 7.5 mg/kg on 01/07/2014 due to poorly controlled HTN.   12/21/2013 Imaging CT CAP- Negative for recurrent or metastatic disease in the chest, abdomen or pelvis.    03/14/2014 Imaging CT CAP- No evidence of thoracic metastasis. No evidence metastasis within the abdomen or pelvis.   07/25/2014 Imaging CT CAP-he slight increase in mediastinal and peripancreatic lymph nodes with a new nodule in the right middle lobe most compatible with inflammatory processes.   11/07/2014 Imaging Mildly increased lymphadenopathy within the chest and abdomen, consistent with metastatic disease.Increased size of right middle lobe pulmonary nodule.Stable multinodular goiter and large anterior abdominal wall hernia.   12/23/2014 - 12/27/2014 Hospital Admission SBO, not felt to be "malignancy related" but secondary to chronically incarcerated ventral incisional hernia   01/07/2015 -  Chemotherapy Panitumumab started     is allergic to actos and ibuprofen.  Monica Allen had no medications administered during this visit.  SURGICAL HISTORY: Past  Surgical History  Procedure Laterality Date  . Wisdom tooth extraction    . Laparotomy  12/13/2012    Procedure: EXPLORATORY LAPAROTOMY;  Surgeon: Alvino Chapel, MD;  Location: [facility];  Service: Gynecology;  Laterality: N/A;  . Abdominal hysterectomy  12/13/2012    Procedure: HYSTERECTOMY ABDOMINAL;  Surgeon: Alvino Chapel, MD;  Location: [facility];  Service: Gynecology;  Laterality: N/A;  . Salpingoophorectomy  12/13/2012    Procedure: SALPINGO OOPHORECTOMY;  Surgeon: Alvino Chapel, MD;  Location: [facility];  Service: Gynecology;  Laterality: Bilateral;  . Omentectomy  12/13/2012    Procedure: OMENTECTOMY;  Surgeon: Alvino Chapel, MD;  Location: [facility];  Service: Gynecology;  Laterality: N/A;  . Colonoscopy N/A 02/14/2013    Procedure: COLONOSCOPY;  Surgeon: Danie Binder, MD;  Location: [facility];  Service: Endoscopy;  Laterality: N/A;  2:00 PM-moved to [place] notified pt  . Partial colectomy N/A 03/01/2013    Procedure: PARTIAL COLECTOMY;  Surgeon: Jamesetta So, MD;  Location: [facility];  Service: General;  Laterality: N/A;  . Portacath placement Left 03/01/2013    Procedure: INSERTION PORT-A-CATH;  Surgeon: Jamesetta So, MD;  Location: [facility];  Service: General;  Laterality: Left;    SOCIAL HISTORY: History   Social History  . Marital Status: Married    Spouse Name: N/A  . Number of Children: N/A  . Years of Education: N/A   Occupational History  . self-employed     childcare provider   Social History Main Topics  . Smoking status: Former Smoker -- 1.50 packs/day for 25 years    Types: Cigarettes    Quit date: 12/10/1987  . Smokeless tobacco: Never Used     Comment: quit 22 years ago  . Alcohol Use: No     Comment: last use x1 month.  . Drug Use: No  . Sexual Activity: Yes    Birth Control/ Protection: Surgical   Other Topics Concern  . Not on file   Social History Narrative    FAMILY HISTORY: Family History  Problem Relation Age  of Onset  . Diabetes Mother   . Hypertension Mother   . Diabetes Sister   . Hypertension Sister   . Hypertension Brother   . Stroke Maternal Aunt   . Diabetes Maternal Grandmother   . Hypertension Maternal Grandmother   . Colon cancer Neg Hx     Review of Systems  Constitutional: Negative for fever, chills, weight loss and malaise/fatigue.  HENT: Negative for congestion, hearing loss, nosebleeds, sore throat and tinnitus.   Eyes: Negative for blurred vision, double vision, pain and discharge.  Respiratory: Negative for cough, hemoptysis, sputum production, shortness of breath and wheezing.   Cardiovascular: Negative for chest pain, palpitations, claudication, leg swelling and PND.  Gastrointestinal: Negative for heartburn, nausea, vomiting, abdominal pain, diarrhea, constipation, blood in stool and melena.  Genitourinary: Negative for dysuria, urgency, frequency and hematuria.  Musculoskeletal: Negative for myalgias, joint pain and falls.  Skin: Negative for itching and rash.  Neurological: Negative for dizziness, tingling, tremors, sensory change, speech change, focal weakness, seizures, loss of consciousness, weakness and headaches.  Endo/Heme/Allergies: Does not bruise/bleed easily.  Psychiatric/Behavioral: Negative for depression, suicidal ideas, memory loss and substance abuse. The patient is not nervous/anxious and does not have insomnia.     PHYSICAL  EXAMINATION  ECOG PERFORMANCE STATUS: 1 - Symptomatic but completely ambulatory  Filed Vitals:   01/01/15 0945  BP: 155/71  Pulse: 73  Temp: 97.8 F (36.6 C)  Resp: 18    Physical Exam  Constitutional: She is oriented to person, place, and time and well-developed, well-nourished, and in no distress.  Obese  HENT:  Head: Normocephalic and atraumatic.  Nose: Nose normal.  Mouth/Throat: Oropharynx is clear and moist. No oropharyngeal exudate.  Eyes: Conjunctivae and EOM are normal. Pupils are equal, round, and reactive  to light. Right eye exhibits no discharge. Left eye exhibits no discharge. No scleral icterus.  Neck: Normal range of motion. Neck supple. No tracheal deviation present. No thyromegaly present.  Cardiovascular: Normal rate, regular rhythm and normal heart sounds.  Exam reveals no gallop and no friction rub.   No murmur heard. Pulmonary/Chest: Effort normal and breath sounds normal. She has no wheezes. She has no rales.  Abdominal: Soft. Bowel sounds are normal. She exhibits no distension and no mass. There is no tenderness. There is no rebound and no guarding.  Musculoskeletal: Normal range of motion. She exhibits no edema.  Lymphadenopathy:    She has no cervical adenopathy.  Neurological: She is alert and oriented to person, place, and time. She has normal reflexes. No cranial nerve deficit. Gait normal. Coordination normal.  Skin: Skin is warm and dry. No rash noted.  Psychiatric: Mood, memory, affect and judgment normal.  Nursing note and vitals reviewed.   LABORATORY DATA:  CBC    Component Value Date/Time   WBC 5.7 01/07/2015 1037   RBC 5.00 01/07/2015 1037   HGB 13.3 01/07/2015 1037   HCT 41.1 01/07/2015 1037   PLT 200 01/07/2015 1037   MCV 82.2 01/07/2015 1037   MCH 26.6 01/07/2015 1037   MCHC 32.4 01/07/2015 1037   RDW 14.5 01/07/2015 1037   LYMPHSABS 1.5 01/07/2015 1037   MONOABS 0.5 01/07/2015 1037   EOSABS 0.2 01/07/2015 1037   BASOSABS 0.0 01/07/2015 1037   CMP     Component Value Date/Time   NA 138 01/01/2015 0951   K 3.4* 01/01/2015 0951   CL 103 01/01/2015 0951   CO2 29 01/01/2015 0951   GLUCOSE 128* 01/01/2015 0951   BUN 6 01/01/2015 0951   CREATININE 0.67 01/01/2015 0951   CALCIUM 8.5 01/01/2015 0951   PROT 6.6 01/01/2015 0951   ALBUMIN 3.2* 01/01/2015 0951   AST 24 01/01/2015 0951   ALT 18 01/01/2015 0951   ALKPHOS 58 01/01/2015 0951   BILITOT 0.4 01/01/2015 0951   GFRNONAA >90 01/01/2015 0951   GFRAA >90 01/01/2015 0951     RADIOGRAPHIC  STUDIES: CLINICAL DATA: RIGHT-sided pain. Decreased stool. Nausea and vomiting. Colectomy. Colon cancer. Chemotherapy on going.  EXAM: CT ABDOMEN AND PELVIS WITHOUT CONTRAST IMPRESSION: 1. Small bowel obstruction with transition point in the RIGHT-sided margin of the large ventral hernia. Obstruction appears high-grade. 2. Ascending colectomy. 3. Stable peripancreatic lymph nodes. 4. Gallstones or sludge. No change from prior. 5. Radiolucent lesion in the medial LEFT femoral head has been slowly increasing over time. Metastatic disease is in the differential considerations. Follow-up nonemergent MRI of the pelvis with and without contrast is recommended for further assessment.   Electronically Signed  By: Dereck Ligas M.D.  On: 12/23/2014 16:21   ASSESSMENT and THERAPY PLAN:   Small bowel obstruction  Her small bowel obstruction was felt to be secondary to incarceration in a large ventral incisional hernia. Symptoms have resolved and she  is currently doing well.    Invasive adenocarcinoma of colon Stage IV adenocarcinoma of the colon, she is on a short chemotherapy holiday. We discussed reinitiating treatment at our visit today. She is K-ras wild type and therefore is a candidate for Vectibux. I discussed the risks and benefits of this therapy in detail. I advised the major side effects tend to be rash, other dermatologic toxicity and ocular toxicity. In general it is very well tolerated.  We discussed the possibility of infusion reaction as well.  She is very interested in proceeding with treatment. We will therefore arrange for her to begin next week. I provided her with reading information on single agent vectibux. I will see her back one week post her first treatment to assess tolerance. If she is interim questions or concerns she knows to call.   All questions were answered. The patient knows to call the clinic with any problems, questions or concerns. We can  certainly see the patient much sooner if necessary. This note was signed electronically Molli Hazard 01/22/2015

## 2015-01-22 NOTE — Assessment & Plan Note (Signed)
Stage IV adenocarcinoma of the colon, she is on a short chemotherapy holiday. We discussed reinitiating treatment at our visit today. She is K-ras wild type and therefore is a candidate for Vectibux. I discussed the risks and benefits of this therapy in detail. I advised the major side effects tend to be rash, other dermatologic toxicity and ocular toxicity. In general it is very well tolerated.  We discussed the possibility of infusion reaction as well.  She is very interested in proceeding with treatment. We will therefore arrange for her to begin next week. I provided her with reading information on single agent vectibux. I will see her back one week post her first treatment to assess tolerance. If she is interim questions or concerns she knows to call.

## 2015-02-04 ENCOUNTER — Encounter (HOSPITAL_BASED_OUTPATIENT_CLINIC_OR_DEPARTMENT_OTHER): Payer: BLUE CROSS/BLUE SHIELD | Admitting: Hematology & Oncology

## 2015-02-04 ENCOUNTER — Encounter (HOSPITAL_BASED_OUTPATIENT_CLINIC_OR_DEPARTMENT_OTHER): Payer: BLUE CROSS/BLUE SHIELD

## 2015-02-04 ENCOUNTER — Encounter (HOSPITAL_COMMUNITY): Payer: Self-pay | Admitting: Hematology & Oncology

## 2015-02-04 VITALS — Wt 247.7 lb

## 2015-02-04 DIAGNOSIS — C187 Malignant neoplasm of sigmoid colon: Secondary | ICD-10-CM | POA: Diagnosis not present

## 2015-02-04 DIAGNOSIS — L270 Generalized skin eruption due to drugs and medicaments taken internally: Secondary | ICD-10-CM

## 2015-02-04 DIAGNOSIS — C189 Malignant neoplasm of colon, unspecified: Secondary | ICD-10-CM | POA: Diagnosis not present

## 2015-02-04 DIAGNOSIS — Z5112 Encounter for antineoplastic immunotherapy: Secondary | ICD-10-CM

## 2015-02-04 DIAGNOSIS — C787 Secondary malignant neoplasm of liver and intrahepatic bile duct: Secondary | ICD-10-CM | POA: Diagnosis not present

## 2015-02-04 LAB — CBC WITH DIFFERENTIAL/PLATELET
BASOS PCT: 0 % (ref 0–1)
Basophils Absolute: 0 10*3/uL (ref 0.0–0.1)
Eosinophils Absolute: 0.2 10*3/uL (ref 0.0–0.7)
Eosinophils Relative: 3 % (ref 0–5)
HCT: 40.4 % (ref 36.0–46.0)
Hemoglobin: 12.8 g/dL (ref 12.0–15.0)
Lymphocytes Relative: 34 % (ref 12–46)
Lymphs Abs: 2.3 10*3/uL (ref 0.7–4.0)
MCH: 26.3 pg (ref 26.0–34.0)
MCHC: 31.7 g/dL (ref 30.0–36.0)
MCV: 83.1 fL (ref 78.0–100.0)
Monocytes Absolute: 0.5 10*3/uL (ref 0.1–1.0)
Monocytes Relative: 7 % (ref 3–12)
NEUTROS PCT: 56 % (ref 43–77)
Neutro Abs: 4 10*3/uL (ref 1.7–7.7)
Platelets: 170 10*3/uL (ref 150–400)
RBC: 4.86 MIL/uL (ref 3.87–5.11)
RDW: 14.6 % (ref 11.5–15.5)
WBC: 7 10*3/uL (ref 4.0–10.5)

## 2015-02-04 LAB — MAGNESIUM: MAGNESIUM: 1.8 mg/dL (ref 1.5–2.5)

## 2015-02-04 MED ORDER — DESONIDE 0.05 % EX LOTN: TOPICAL_LOTION | CUTANEOUS | Status: AC

## 2015-02-04 MED ORDER — SODIUM CHLORIDE 0.9 % IJ SOLN
10.0000 mL | INTRAMUSCULAR | Status: DC | PRN
  Administered 2015-02-04: 10 mL
  Filled 2015-02-04: qty 10

## 2015-02-04 MED ORDER — HEPARIN SOD (PORK) LOCK FLUSH 100 UNIT/ML IV SOLN
500.0000 [IU] | Freq: Once | INTRAVENOUS | Status: AC | PRN
  Administered 2015-02-04: 500 [IU]
  Filled 2015-02-04: qty 5

## 2015-02-04 MED ORDER — DOXYCYCLINE HYCLATE 100 MG PO TABS: 100.0000 mg | ORAL_TABLET | Freq: Two times a day (BID) | ORAL | Status: AC

## 2015-02-04 MED ORDER — SODIUM CHLORIDE 0.9 % IV SOLN
Freq: Once | INTRAVENOUS | Status: AC
  Administered 2015-02-04: 12:00:00 via INTRAVENOUS

## 2015-02-04 MED ORDER — SODIUM CHLORIDE 0.9 % IV SOLN
6.0000 mg/kg | Freq: Once | INTRAVENOUS | Status: AC
  Administered 2015-02-04: 700 mg via INTRAVENOUS
  Filled 2015-02-04: qty 35

## 2015-02-04 MED ORDER — OXYCODONE-ACETAMINOPHEN 5-325 MG PO TABS: ORAL_TABLET | ORAL | Status: DC

## 2015-02-04 NOTE — Progress Notes (Signed)
Tolerated chemo well. 

## 2015-02-04 NOTE — Patient Instructions (Signed)
[facility] Discharge Instructions for Patients Receiving Chemotherapy  Today you received the following chemotherapy agents Vectibix. To help prevent nausea and vomiting after your treatment, we encourage you to take your nausea medication as instructed. If you develop nausea and vomiting that is not controlled by your nausea medication, call the clinic. If it is after clinic hours your family physician or the after hours number for the clinic or go to the Emergency Department. BELOW ARE SYMPTOMS THAT SHOULD BE REPORTED IMMEDIATELY:  *FEVER GREATER THAN 101.0 F  *CHILLS WITH OR WITHOUT FEVER  NAUSEA AND VOMITING THAT IS NOT CONTROLLED WITH YOUR NAUSEA MEDICATION  *UNUSUAL SHORTNESS OF BREATH  *UNUSUAL BRUISING OR BLEEDING  TENDERNESS IN MOUTH AND THROAT WITH OR WITHOUT PRESENCE OF ULCERS  *URINARY PROBLEMS  *BOWEL PROBLEMS  UNUSUAL RASH Items with * indicate a potential emergency and should be followed up as soon as possible.  Return as scheduled.  I have been informed and understand all the instructions given to me. I know to contact the clinic, my physician, or go to the Emergency Department if any problems should occur. I do not have any questions at this time, but understand that I may call the clinic during office hours or the Patient Navigator at [phone] should I have any questions or need assistance in obtaining follow up care.    __________________________________________  _____________  __________ Signature of Patient or Authorized Representative            Date                   Time    __________________________________________ Nurse's Signature

## 2015-02-04 NOTE — Progress Notes (Signed)
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 Aldean Ast on 12/13/2012. At that time he did TAH and BSO with lysis of adhesions, partial omentectomy, and resection of pelvic peritoneal implant. At the completion of the surgical procedure there is no evidence of residual disease. Her CEA is within the normal range. Her cancer however did not mark as ovarian in origin and a subsequent colonoscopy revealed a right ascending colon cancer. This was discovered by Dr. Trinda Pascal on 02/14/2013. It was near circumferential.   Kras Wildtype  CEA not elevated  CURRENT THERAPY: Panitumumab  INTERVAL HISTORY: Monica Allen 59 y.o. female returns for follow-up of her stage IV colon cancer. She does note a rash, principally on the cheeks and nose but also involving her chest and back. She is using moisturizing cream on a regular basis. She has not been using sunscreen. He is delayed a week on treatment because she was unable to make it last week. She has no other major complaints or concerns. Her appetite is excellent.    MEDICAL HISTORY: Past Medical History  Diagnosis Date  . Diabetes mellitus   . Hypertension   . Enlarged heart   . Goiter   . Nonischemic cardiomyopathy   . Anemia   . Abdominal hernia   . 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 Invasive adenocarcinoma of colon 03/08/2013    11/19 LNs POS, THRU TO ADIPOSE TISSUE    has THYROMEGALY; DIABETES MELLITUS, UNCONTROLLED, WITH RENAL COMPLICATIONS; ANXIETY; DEPRESSION; HYPERTENSION; CARDIOMYOPATHY, DILATED; CONGESTIVE HEART FAILURE; SINUSITIS, CHRONIC; ALLERGIC RHINITIS; GERD; ONYCHIA AND PARONYCHIA OF TOE; LOW BACK PAIN; Diabetes mellitus; Invasive adenocarcinoma of colon; SBO (small bowel obstruction); Type 2 diabetes mellitus; Essential hypertension; and Chronic CHF (congestive heart  failure) on her problem list.      Invasive adenocarcinoma of colon   01/02/2013 Initial Diagnosis Left ovary and fallopian tube is positive for invasive adenocarcinoma with LVI and positive pelvis biopsy    02/14/2013 Procedure Ascending colon biopsy revealing invasive adenocarcinoma.  KRAS wildtype   03/01/2013 Surgery Left segmental resection showing invasive adenocarcinoma through the muscularis propria into the pericolonoc fat involving the serosa with LVI.  Clear margins.  11/19 positive lymph nodes for disease   04/04/2013 - 09/12/2013 Chemotherapy FOLFOX + Avastin x 12 cycles   09/26/2013 Imaging CT CAP- No definite signs of metastatic disease in the chest, abdomen or pelvis   10/02/2013 - 10/24/2014 Chemotherapy Avastin + Xeloda maintenance.  Avstin dose decreased to 7.5 mg/kg on 01/07/2014 due to poorly controlled HTN.   12/21/2013 Imaging CT CAP- Negative for recurrent or metastatic disease in the chest, abdomen or pelvis.    03/14/2014 Imaging CT CAP- No evidence of thoracic metastasis. No evidence metastasis within the abdomen or pelvis.   07/25/2014 Imaging CT CAP-he slight increase in mediastinal and peripancreatic lymph nodes with a new nodule in the right middle lobe most compatible with inflammatory processes.   11/07/2014 Imaging Mildly increased lymphadenopathy within the chest and abdomen, consistent with metastatic disease.Increased size of right middle lobe pulmonary nodule.Stable multinodular goiter and large anterior abdominal wall hernia.   12/23/2014 - 12/27/2014 Hospital Admission SBO, not felt to be "malignancy related" but secondary to chronically incarcerated ventral incisional hernia   01/07/2015 -  Chemotherapy Panitumumab started     is allergic to actos and ibuprofen.  Ms.  Mizrachi does not currently have medications on file.  SURGICAL HISTORY: Past Surgical History  Procedure Laterality Date  . Wisdom tooth extraction    . Laparotomy  12/13/2012    Procedure: EXPLORATORY  LAPAROTOMY;  Surgeon: Alvino Chapel, MD;  Location: [facility];  Service: Gynecology;  Laterality: N/A;  . Abdominal hysterectomy  12/13/2012    Procedure: HYSTERECTOMY ABDOMINAL;  Surgeon: Alvino Chapel, MD;  Location: [facility];  Service: Gynecology;  Laterality: N/A;  . Salpingoophorectomy  12/13/2012    Procedure: SALPINGO OOPHORECTOMY;  Surgeon: Alvino Chapel, MD;  Location: [facility];  Service: Gynecology;  Laterality: Bilateral;  . Omentectomy  12/13/2012    Procedure: OMENTECTOMY;  Surgeon: Alvino Chapel, MD;  Location: [facility];  Service: Gynecology;  Laterality: N/A;  . Colonoscopy N/A 02/14/2013    Procedure: COLONOSCOPY;  Surgeon: Danie Binder, MD;  Location: [facility];  Service: Endoscopy;  Laterality: N/A;  2:00 PM-moved to [place] notified pt  . Partial colectomy N/A 03/01/2013    Procedure: PARTIAL COLECTOMY;  Surgeon: Jamesetta So, MD;  Location: [facility];  Service: General;  Laterality: N/A;  . Portacath placement Left 03/01/2013    Procedure: INSERTION PORT-A-CATH;  Surgeon: Jamesetta So, MD;  Location: [facility];  Service: General;  Laterality: Left;    SOCIAL HISTORY: History   Social History  . Marital Status: Married    Spouse Name: N/A  . Number of Children: N/A  . Years of Education: N/A   Occupational History  . self-employed     childcare provider   Social History Main Topics  . Smoking status: Former Smoker -- 1.50 packs/day for 25 years    Types: Cigarettes    Quit date: 12/10/1987  . Smokeless tobacco: Never Used     Comment: quit 22 years ago  . Alcohol Use: No     Comment: last use x1 month.  . Drug Use: No  . Sexual Activity: Yes    Birth Control/ Protection: Surgical   Other Topics Concern  . Not on file   Social History Narrative    FAMILY HISTORY: Family History  Problem Relation Age of Onset  . Diabetes Mother   . Hypertension Mother   . Diabetes Sister   . Hypertension Sister   . Hypertension Brother     . Stroke Maternal Aunt   . Diabetes Maternal Grandmother   . Hypertension Maternal Grandmother   . Colon cancer Neg Hx     Review of Systems  Constitutional: Negative for fever, chills, weight loss and malaise/fatigue.  HENT: Negative for congestion, hearing loss, nosebleeds, sore throat and tinnitus.   Eyes: Negative for blurred vision, double vision, pain and discharge.  Respiratory: Negative for cough, hemoptysis, sputum production, shortness of breath and wheezing.   Cardiovascular: Negative for chest pain, palpitations, claudication, leg swelling and PND.  Gastrointestinal: Negative for heartburn, nausea, vomiting, abdominal pain, diarrhea, constipation, blood in stool and melena.  Genitourinary: Negative for dysuria, urgency, frequency and hematuria.  Musculoskeletal: Negative for myalgias, joint pain and falls.  Skin: Positive for itching and rash.  Neurological: Negative for dizziness, tingling, tremors, sensory change, speech change, focal weakness, seizures, loss of consciousness, weakness and headaches.  Endo/Heme/Allergies: Does not bruise/bleed easily.  Psychiatric/Behavioral: Negative for depression, suicidal ideas, memory loss and substance abuse. The patient is not nervous/anxious and does not have insomnia.     PHYSICAL EXAMINATION  ECOG PERFORMANCE STATUS: 1 - Symptomatic but completely ambulatory  There were  no vitals filed for this visit.  Physical Exam  Constitutional: She is oriented to person, place, and time and well-developed, well-nourished, and in no distress.  Obese  HENT:  Head: Normocephalic and atraumatic.  Nose: Nose normal.  Mouth/Throat: Oropharynx is clear and moist. No oropharyngeal exudate.  Eyes: Conjunctivae and EOM are normal. Pupils are equal, round, and reactive to light. Right eye exhibits no discharge. Left eye exhibits no discharge. No scleral icterus.  Neck: Normal range of motion. Neck supple. No tracheal deviation present. No  thyromegaly present.  Cardiovascular: Normal rate, regular rhythm and normal heart sounds.  Exam reveals no gallop and no friction rub.   No murmur heard. Pulmonary/Chest: Effort normal and breath sounds normal. She has no wheezes. She has no rales.  Abdominal: Soft. Bowel sounds are normal. She exhibits no distension and no mass. There is no tenderness. There is no rebound and no guarding.  Musculoskeletal: Normal range of motion. She exhibits no edema.  Lymphadenopathy:    She has no cervical adenopathy.  Neurological: She is alert and oriented to person, place, and time. She has normal reflexes. No cranial nerve deficit. Gait normal. Coordination normal.  Skin: Skin is warm and dry. Rash noted.  Grade 1 to 2 rash on face, chest and back. No eye involvement or nail involvement  Psychiatric: Mood, memory, affect and judgment normal.  Nursing note and vitals reviewed.   LABORATORY DATA:  CBC    Component Value Date/Time   WBC 7.0 02/04/2015 1050   RBC 4.86 02/04/2015 1050   HGB 12.8 02/04/2015 1050   HCT 40.4 02/04/2015 1050   PLT 170 02/04/2015 1050   MCV 83.1 02/04/2015 1050   MCH 26.3 02/04/2015 1050   MCHC 31.7 02/04/2015 1050   RDW 14.6 02/04/2015 1050   LYMPHSABS 2.3 02/04/2015 1050   MONOABS 0.5 02/04/2015 1050   EOSABS 0.2 02/04/2015 1050   BASOSABS 0.0 02/04/2015 1050   CMP     Component Value Date/Time   NA 138 01/01/2015 0951   K 3.4* 01/01/2015 0951   CL 103 01/01/2015 0951   CO2 29 01/01/2015 0951   GLUCOSE 128* 01/01/2015 0951   BUN 6 01/01/2015 0951   CREATININE 0.67 01/01/2015 0951   CALCIUM 8.5 01/01/2015 0951   PROT 6.6 01/01/2015 0951   ALBUMIN 3.2* 01/01/2015 0951   AST 24 01/01/2015 0951   ALT 18 01/01/2015 0951   ALKPHOS 58 01/01/2015 0951   BILITOT 0.4 01/01/2015 0951   GFRNONAA >90 01/01/2015 0951   GFRAA >90 01/01/2015 0951     RADIOGRAPHIC STUDIES: CLINICAL DATA: RIGHT-sided pain. Decreased stool. Nausea and vomiting. Colectomy.  Colon cancer. Chemotherapy on going.  EXAM: CT ABDOMEN AND PELVIS WITHOUT CONTRAST  TECHNIQUE: Multidetector CT imaging of the abdomen and pelvis was performed following the standard protocol without IV contrast.  COMPARISON: 11/07/2014. IMPRESSION: 1. Small bowel obstruction with transition point in the RIGHT-sided margin of the large ventral hernia. Obstruction appears high-grade. 2. Ascending colectomy. 3. Stable peripancreatic lymph nodes. 4. Gallstones or sludge. No change from prior. 5. Radiolucent lesion in the medial LEFT femoral head has been slowly increasing over time. Metastatic disease is in the differential considerations. Follow-up nonemergent MRI of the pelvis with and without contrast is recommended for further assessment.   Electronically Signed  By: Dereck Ligas M.D.  On: 12/23/2014 16:21  ASSESSMENT and THERAPY PLAN:   Stage IV colon cancer, K-ras wild type  59 year old female with stage IV colon cancer currently on single  agent Vectibux. Other than a grade 2 rash she has done well with treatment. We will continue forward at the current dose monitoring her skin closely for toxicity and the need for dose reduction.  Drug-induced rash  She will be picking up doxycycline and also a steroid cream which I have called in for her to [facility]. I have instructed her on appropriate use. I have again emphasized the importance of sun protection. We will see how she does after the treatment today and if needed may need to consider dose reduction depending on the severity of her rash.  All questions were answered. The patient knows to call the clinic with any problems, questions or concerns. We can certainly see the patient much sooner if necessary.  Molli Hazard 02/04/2015

## 2015-02-04 NOTE — Patient Instructions (Addendum)
..  [place] at [facility] Discharge Instructions  RECOMMENDATIONS MADE BY THE CONSULTANT AND ANY TEST RESULTS WILL BE SENT TO YOUR REFERRING PHYSICIAN.  Exam per Dr. Whitney Muse today Prescriptions given for rash: Doxycycline and topical cleocin  Return in 2 weeks  Thank you for choosing [place] at [facility] to provide your oncology and hematology care.  To afford each patient quality time with our provider, please arrive at least 15 minutes before your scheduled appointment time.    You need to re-schedule your appointment should you arrive 10 or more minutes late.  We strive to give you quality time with our providers, and arriving late affects you and other patients whose appointments are after yours.  Also, if you no show three or more times for appointments you may be dismissed from the clinic at the providers discretion.     Again, thank you for choosing [facility].  Our hope is that these requests will decrease the amount of time that you wait before being seen by our physicians.       _____________________________________________________________  Should you have questions after your visit to [facility], please contact our office at [phone] between the hours of 8:30 a.m. and 4:30 p.m.  Voicemails left after 4:30 p.m. will not be returned until the following business day.  For prescription refill requests, have your pharmacy contact our office.

## 2015-02-18 ENCOUNTER — Encounter (HOSPITAL_BASED_OUTPATIENT_CLINIC_OR_DEPARTMENT_OTHER): Payer: BLUE CROSS/BLUE SHIELD | Admitting: Oncology

## 2015-02-18 ENCOUNTER — Encounter (HOSPITAL_COMMUNITY): Payer: BLUE CROSS/BLUE SHIELD | Attending: Oncology

## 2015-02-18 VITALS — BP 135/45 | HR 67 | Temp 98.1°F | Resp 16 | Wt 253.5 lb

## 2015-02-18 DIAGNOSIS — C189 Malignant neoplasm of colon, unspecified: Secondary | ICD-10-CM | POA: Insufficient documentation

## 2015-02-18 DIAGNOSIS — Z5112 Encounter for antineoplastic immunotherapy: Secondary | ICD-10-CM | POA: Diagnosis not present

## 2015-02-18 DIAGNOSIS — C182 Malignant neoplasm of ascending colon: Secondary | ICD-10-CM | POA: Diagnosis not present

## 2015-02-18 DIAGNOSIS — C787 Secondary malignant neoplasm of liver and intrahepatic bile duct: Secondary | ICD-10-CM

## 2015-02-18 DIAGNOSIS — C187 Malignant neoplasm of sigmoid colon: Secondary | ICD-10-CM | POA: Diagnosis not present

## 2015-02-18 LAB — COMPREHENSIVE METABOLIC PANEL
ALT: 21 U/L (ref 0–35)
AST: 21 U/L (ref 0–37)
Albumin: 3.4 g/dL — ABNORMAL LOW (ref 3.5–5.2)
Alkaline Phosphatase: 56 U/L (ref 39–117)
Anion gap: 8 (ref 5–15)
BUN: 14 mg/dL (ref 6–23)
CO2: 25 mmol/L (ref 19–32)
Calcium: 8.8 mg/dL (ref 8.4–10.5)
Chloride: 107 mmol/L (ref 96–112)
Creatinine, Ser: 0.6 mg/dL (ref 0.50–1.10)
GFR calc Af Amer: >90 mL/min (ref >90)
GFR calc non Af Amer: >90 mL/min (ref >90)
Glucose, Bld: 153 mg/dL — ABNORMAL HIGH (ref 70–99)
Potassium: 3.7 mmol/L (ref 3.5–5.1)
Sodium: 140 mmol/L (ref 135–145)
Total Bilirubin: 0.4 mg/dL (ref 0.3–1.2)
Total Protein: 6.7 g/dL (ref 6.0–8.3)

## 2015-02-18 LAB — CBC WITH DIFFERENTIAL/PLATELET
Basophils Absolute: 0 10*3/uL (ref 0.0–0.1)
Basophils Relative: 0 % (ref 0–1)
Eosinophils Absolute: 0.2 10*3/uL (ref 0.0–0.7)
Eosinophils Relative: 3 % (ref 0–5)
HCT: 37.3 % (ref 36.0–46.0)
Hemoglobin: 11.9 g/dL — ABNORMAL LOW (ref 12.0–15.0)
Lymphocytes Relative: 32 % (ref 12–46)
Lymphs Abs: 2 10*3/uL (ref 0.7–4.0)
MCH: 26.7 pg (ref 26.0–34.0)
MCHC: 31.9 g/dL (ref 30.0–36.0)
MCV: 83.6 fL (ref 78.0–100.0)
Monocytes Absolute: 0.5 10*3/uL (ref 0.1–1.0)
Monocytes Relative: 9 % (ref 3–12)
Neutro Abs: 3.5 10*3/uL (ref 1.7–7.7)
Neutrophils Relative %: 56 % (ref 43–77)
Platelets: 141 10*3/uL — ABNORMAL LOW (ref 150–400)
RBC: 4.46 MIL/uL (ref 3.87–5.11)
RDW: 14.9 % (ref 11.5–15.5)
WBC: 6.2 10*3/uL (ref 4.0–10.5)

## 2015-02-18 LAB — MAGNESIUM: Magnesium: 1.7 mg/dL (ref 1.5–2.5)

## 2015-02-18 MED ORDER — SODIUM CHLORIDE 0.9 % IV SOLN
Freq: Once | INTRAVENOUS | Status: AC
  Administered 2015-02-18: 13:00:00 via INTRAVENOUS

## 2015-02-18 MED ORDER — SODIUM CHLORIDE 0.9 % IV SOLN
6.0000 mg/kg | Freq: Once | INTRAVENOUS | Status: AC
  Administered 2015-02-18: 700 mg via INTRAVENOUS
  Filled 2015-02-18: qty 35

## 2015-02-18 MED ORDER — SODIUM CHLORIDE 0.9 % IJ SOLN: 10.0000 mL | INTRAMUSCULAR | Status: DC | PRN

## 2015-02-18 MED ORDER — HEPARIN SOD (PORK) LOCK FLUSH 100 UNIT/ML IV SOLN
INTRAVENOUS | Status: AC
  Filled 2015-02-18: qty 5

## 2015-02-18 MED ORDER — HEPARIN SOD (PORK) LOCK FLUSH 100 UNIT/ML IV SOLN
500.0000 [IU] | Freq: Once | INTRAVENOUS | Status: AC | PRN
  Administered 2015-02-18: 500 [IU]

## 2015-02-18 NOTE — Patient Instructions (Signed)
..  [place] at [facility] Discharge Instructions  RECOMMENDATIONS MADE BY THE CONSULTANT AND ANY TEST RESULTS WILL BE SENT TO YOUR REFERRING PHYSICIAN.  Continue chemo treatments as scheduled. Return in 2 wks for chemo and follow up appt with Kirby Crigler, PA. Pre-chemo labs as scheduled.   Thank you for choosing [place] at [facility] to provide your oncology and hematology care.  To afford each patient quality time with our provider, please arrive at least 15 minutes before your scheduled appointment time.    You need to re-schedule your appointment should you arrive 10 or more minutes late.  We strive to give you quality time with our providers, and arriving late affects you and other patients whose appointments are after yours.  Also, if you no show three or more times for appointments you may be dismissed from the clinic at the providers discretion.     Again, thank you for choosing [facility].  Our hope is that these requests will decrease the amount of time that you wait before being seen by our physicians.       _____________________________________________________________  Should you have questions after your visit to [facility], please contact our office at [phone] between the hours of 8:30 a.m. and 4:30 p.m.  Voicemails left after 4:30 p.m. will not be returned until the following business day.  For prescription refill requests, have your pharmacy contact our office.

## 2015-02-18 NOTE — Progress Notes (Signed)
Sharlotte Alamo Tolerated chemotherapy well today.  Discharged ambulatory

## 2015-02-18 NOTE — Patient Instructions (Signed)
[facility] Discharge Instructions for Patients Receiving Chemotherapy  Today you received the following chemotherapy agents vectibex Please follow up as scheduled.  Call the clinic if you have any questions or concerns  To help prevent nausea and vomiting after your treatment, we encourage you to take your nausea medication  If you develop nausea and vomiting that is not controlled by your nausea medication, call the clinic. If it is after clinic hours your family physician or the after hours number for the clinic or go to the Emergency Department.   BELOW ARE SYMPTOMS THAT SHOULD BE REPORTED IMMEDIATELY:  *FEVER GREATER THAN 101.0 F  *CHILLS WITH OR WITHOUT FEVER  NAUSEA AND VOMITING THAT IS NOT CONTROLLED WITH YOUR NAUSEA MEDICATION  *UNUSUAL SHORTNESS OF BREATH  *UNUSUAL BRUISING OR BLEEDING  TENDERNESS IN MOUTH AND THROAT WITH OR WITHOUT PRESENCE OF ULCERS  *URINARY PROBLEMS  *BOWEL PROBLEMS  UNUSUAL RASH Items with * indicate a potential emergency and should be followed up as soon as possible.  One of the nurses will contact you 24 hours after your treatment. Please let the nurse know about any problems that you may have experienced. Feel free to call the clinic you have any questions or concerns. The clinic phone number is [phone].   I have been informed and understand all the instructions given to me. I know to contact the clinic, my physician, or go to the Emergency Department if any problems should occur. I do not have any questions at this time, but understand that I may call the clinic during office hours or the Patient Navigator at [phone] should I have any questions or need assistance in obtaining follow up care.

## 2015-02-18 NOTE — Assessment & Plan Note (Addendum)
59 year old female with stage IV colon cancer currently on single agent Vectibux.   She experienced a Grade 2, Panitumumab induced acne rash that is improving with Doxycycline and steroid cream.  It is down to a grade 1.  She reports signigicant improvement in rash.  No role for dose reduction at this time as this rash is expected with Panitumumab.  She is on appropriate treatment with improvement.   Pre-chemotherapy labs as planned today.  Return in 2 weeks for follow-up, pre-chemo labs, and treatment.

## 2015-02-18 NOTE — Progress Notes (Signed)
[name], PA-C [place] [address]  Invasive adenocarcinoma of colon - Plan: Comprehensive metabolic panel  CURRENT THERAPY: Panitumumab  INTERVAL HISTORY: Monica Allen 59 y.o. 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 Aldean Ast on 12/13/2012. At that time he did TAH and BSO with lysis of adhesions, partial omentectomy, and resection of pelvic peritoneal implant. At the completion of the surgical procedure there is no evidence of residual disease. Her CEA is within the normal range. Her cancer however did not mark as ovarian in origin and a subsequent colonoscopy revealed a right ascending colon cancer. This was discovered by Dr. Barney Drain on 02/14/2013. It was near circumferential.      Invasive adenocarcinoma of colon   01/02/2013 Initial Diagnosis Left ovary and fallopian tube is positive for invasive adenocarcinoma with LVI and positive pelvis biopsy    02/14/2013 Procedure Ascending colon biopsy revealing invasive adenocarcinoma.  KRAS wildtype   03/01/2013 Surgery Left segmental resection showing invasive adenocarcinoma through the muscularis propria into the pericolonoc fat involving the serosa with LVI.  Clear margins.  11/19 positive lymph nodes for disease   04/04/2013 - 09/12/2013 Chemotherapy FOLFOX + Avastin x 12 cycles   09/26/2013 Imaging CT CAP- No definite signs of metastatic disease in the chest, abdomen or pelvis   10/02/2013 - 10/24/2014 Chemotherapy Avastin + Xeloda maintenance.  Avstin dose decreased to 7.5 mg/kg on 01/07/2014 due to poorly controlled HTN.   12/21/2013 Imaging CT CAP- Negative for recurrent or metastatic disease in the chest, abdomen or pelvis.    03/14/2014 Imaging CT CAP- No evidence of thoracic metastasis. No evidence metastasis within the abdomen or pelvis.   07/25/2014 Imaging CT CAP-he slight  increase in mediastinal and peripancreatic lymph nodes with a new nodule in the right middle lobe most compatible with inflammatory processes.   11/07/2014 Imaging Mildly increased lymphadenopathy within the chest and abdomen, consistent with metastatic disease.Increased size of right middle lobe pulmonary nodule.Stable multinodular goiter and large anterior abdominal wall hernia.   12/23/2014 - 12/27/2014 Hospital Admission SBO, not felt to be "malignancy related" but secondary to chronically incarcerated ventral incisional hernia   01/07/2015 -  Chemotherapy Panitumumab started   02/04/2015 Adverse Reaction Panitumumab-induced acne rash, expected with treatment.    I personally reviewed and went over laboratory results with the patient.  The results are noted within this dictation.  She is tolerating therapy well.  Her acne-rash from Panitumumab is improving with treatment.  She reports it is much improved and is not affecting her QOL.    Her BP is 134/45.  She is concerned about the diastolic pressure.  She is completely asymptomatic from a hypotensive standpoint.  She reports that she feels great and I am not sure anything needs done regarding a BP standpoint at this time.  We will monitor throughout her treatment today.  Oncologically, she denies any complaints and ROS questioning is negative.  Past Medical History  Diagnosis Date  . Diabetes mellitus   . Hypertension   . Enlarged heart   . Goiter   . Nonischemic cardiomyopathy   . Anemia   . Abdominal hernia   . 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 Invasive adenocarcinoma of colon 03/08/2013    11/19 LNs POS, THRU TO ADIPOSE TISSUE    has THYROMEGALY; DIABETES MELLITUS, UNCONTROLLED, WITH  RENAL COMPLICATIONS; ANXIETY; DEPRESSION; HYPERTENSION; CARDIOMYOPATHY, DILATED; CONGESTIVE HEART FAILURE; SINUSITIS, CHRONIC; ALLERGIC RHINITIS; GERD; ONYCHIA AND PARONYCHIA OF TOE; LOW BACK PAIN; Diabetes mellitus; Invasive adenocarcinoma of  colon; SBO (small bowel obstruction); Type 2 diabetes mellitus; Essential hypertension; and Chronic CHF (congestive heart failure) on her problem list.     is allergic to actos and ibuprofen.  Ms. Meske does not currently have medications on file.  Past Surgical History  Procedure Laterality Date  . Wisdom tooth extraction    . Laparotomy  12/13/2012    Procedure: EXPLORATORY LAPAROTOMY;  Surgeon: Alvino Chapel, MD;  Location: [facility];  Service: Gynecology;  Laterality: N/A;  . Abdominal hysterectomy  12/13/2012    Procedure: HYSTERECTOMY ABDOMINAL;  Surgeon: Alvino Chapel, MD;  [facility]: [facility];  Service: Gynecology;  Laterality: N/A;  . Salpingoophorectomy  12/13/2012    Procedure: SALPINGO OOPHORECTOMY;  Surgeon: Alvino Chapel, MD;  [facility];  Service: Gynecology;  Laterality: Bilateral;  . Omentectomy  12/13/2012    Procedure: OMENTECTOMY;  Surgeon: Alvino Chapel, MD;  [facility];  Service: Gynecology;  Laterality: N/A;  . Colonoscopy N/A 02/14/2013    Procedure: COLONOSCOPY;  Surgeon: Danie Binder, MD;  [facility];  Service: Endoscopy;  Laterality: N/A;  2:00 PM-moved to [place] notified pt  . Partial colectomy N/A 03/01/2013    Procedure: PARTIAL COLECTOMY;  Surgeon: Jamesetta So, MD;  Location: [facility];  Service: General;  Laterality: N/A;  . Portacath placement Left 03/01/2013    Procedure: INSERTION PORT-A-CATH;  Surgeon: Jamesetta So, MD;  Location: [facility];  Service: General;  Laterality: Left;    Denies any headaches, dizziness, double vision, fevers, chills, night sweats, nausea, vomiting, diarrhea, constipation, chest pain, heart palpitations, shortness of breath, blood in stool, black tarry stool, urinary pain, urinary burning, urinary frequency, hematuria.   PHYSICAL EXAMINATION  ECOG PERFORMANCE STATUS: 0 - Asymptomatic  Filed Vitals:   02/18/15 1032  BP: 135/45  Pulse: 67  Temp: 98.1 F (36.7 C)  Resp:  16    GENERAL:alert, no distress, well nourished, well developed, comfortable, cooperative, obese and smiling SKIN: skin color, texture, turgor are normal, positive AYO:[id number] acne rash of face, chest, and upper back. HEAD: Normocephalic, No masses, lesions, tenderness or abnormalities, rash as described above. EYES: normal, PERRLA, EOMI, Conjunctiva are pink and non-injected EARS: External ears normal OROPHARYNX:lips, buccal mucosa, and tongue normal and mucous membranes are moist  NECK: supple, no adenopathy, thyroid normal size, non-tender, without nodularity, no stridor, non-tender, trachea midline LYMPH:  no palpable lymphadenopathy BREAST:not examined LUNGS: clear to auscultation and percussion HEART: regular rate & rhythm, no murmurs, no gallops, S1 normal and S2 normal ABDOMEN:abdomen soft, non-tender, obese, normal bowel sounds and no masses or organomegaly BACK: Back symmetric, no curvature., No CVA tenderness EXTREMITIES:less then 2 second capillary refill, no joint deformities, effusion, or inflammation, no skin discoloration, no clubbing, no cyanosis  NEURO: alert & oriented x 3 with fluent speech, no focal motor/sensory deficits, gait normal   LABORATORY DATA: CBC    Component Value Date/Time   WBC 7.0 02/04/2015 1050   RBC 4.86 02/04/2015 1050   HGB 12.8 02/04/2015 1050   HCT 40.4 02/04/2015 1050   PLT 170 02/04/2015 1050   MCV 83.1 02/04/2015 1050   MCH 26.3 02/04/2015 1050   MCHC 31.7 02/04/2015 1050   RDW 14.6 02/04/2015 1050   LYMPHSABS 2.3 02/04/2015 1050   MONOABS 0.5 02/04/2015 1050  EOSABS 0.2 02/04/2015 1050   BASOSABS 0.0 02/04/2015 1050      Chemistry      Component Value Date/Time   NA 138 01/01/2015 0951   K 3.4* 01/01/2015 0951   CL 103 01/01/2015 0951   CO2 29 01/01/2015 0951   BUN 6 01/01/2015 0951   CREATININE 0.67 01/01/2015 0951      Component Value Date/Time   CALCIUM 8.5 01/01/2015 0951   ALKPHOS 58 01/01/2015 0951     AST 24 01/01/2015 0951   ALT 18 01/01/2015 0951   BILITOT 0.4 01/01/2015 0951        ASSESSMENT AND PLAN:  Invasive adenocarcinoma of colon 59 year old female with stage IV colon cancer currently on single agent Vectibux.   She experienced a Grade 2, Panitumumab induced acne rash that is improving with Doxycycline and steroid cream.  It is down to a grade 1.  She reports signigicant improvement in rash.  No role for dose reduction at this time as this rash is expected with Panitumumab.  She is on appropriate treatment with improvement.   Pre-chemotherapy labs as planned today.  Return in 2 weeks for follow-up, pre-chemo labs, and treatment.    THERAPY PLAN:  Continue with therapy as planned and monitor for toxicities of treatment and progression of disease.  All questions were answered. The patient knows to call the clinic with any problems, questions or concerns. We can certainly see the patient much sooner if necessary.  Patient and plan discussed with Dr. Ancil Linsey and she is in agreement with the aforementioned.   This note is electronically signed by: Robynn Pane 02/18/2015 11:11 AM

## 2015-03-02 NOTE — Progress Notes (Signed)
Monica Cheadle, PA-C [address] [place] [address]  Invasive adenocarcinoma of colon - Plan: oxyCODONE-acetaminophen (PERCOCET/ROXICET) 5-325 MG per tablet  CURRENT THERAPY: Panitumumab  INTERVAL HISTORY: Monica Allen 59 y.o. 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 Aldean Ast on 12/13/2012. At that time he did TAH and BSO with lysis of adhesions, partial omentectomy, and resection of pelvic peritoneal implant. At the completion of the surgical procedure there is no evidence of residual disease. Her CEA is within the normal range. Her cancer however did not mark as ovarian in origin and a subsequent colonoscopy revealed a right ascending colon cancer. This was discovered by Dr. Barney Drain on 02/14/2013. It was near circumferential.     Invasive adenocarcinoma of colon   01/02/2013 Initial Diagnosis Left ovary and fallopian tube is positive for invasive adenocarcinoma with LVI and positive pelvis biopsy    02/14/2013 Procedure Ascending colon biopsy revealing invasive adenocarcinoma.  KRAS wildtype   03/01/2013 Surgery Left segmental resection showing invasive adenocarcinoma through the muscularis propria into the pericolonoc fat involving the serosa with LVI.  Clear margins.  11/19 positive lymph nodes for disease   04/04/2013 - 09/12/2013 Chemotherapy FOLFOX + Avastin x 12 cycles   09/26/2013 Imaging CT CAP- No definite signs of metastatic disease in the chest, abdomen or pelvis   10/02/2013 - 10/24/2014 Chemotherapy Avastin + Xeloda maintenance.  Avstin dose decreased to 7.5 mg/kg on 01/07/2014 due to poorly controlled HTN.   12/21/2013 Imaging CT CAP- Negative for recurrent or metastatic disease in the chest, abdomen or pelvis.    03/14/2014 Imaging CT CAP- No evidence of thoracic metastasis. No evidence metastasis within the abdomen or pelvis.   07/25/2014 Imaging CT CAP-he slight increase in mediastinal and peripancreatic lymph nodes with a new nodule in the right middle lobe most compatible with inflammatory processes.   11/07/2014 Imaging Mildly increased lymphadenopathy within the chest and abdomen, consistent with metastatic disease.Increased size of right middle lobe pulmonary nodule.Stable multinodular goiter and large anterior abdominal wall hernia.   12/23/2014 - 12/27/2014 Hospital Admission SBO, not felt to be "malignancy related" but secondary to chronically incarcerated ventral incisional hernia   01/07/2015 -  Chemotherapy Panitumumab started   02/04/2015 Adverse Reaction Panitumumab-induced acne rash, expected with treatment.     I personally reviewed and went over laboratory results with the patient.  The results are noted within this dictation.  She is tolerating therapy well.  Her acne-rash is noted and is secondary to treatment.  She is using her [prescriptions appropriately for this side effect.  It is not grade 3 and therefore does not require dose modification at this time.    Oncologically, she denies any complaints and ROS questioning is negative.  Past Medical History  Diagnosis Date  . Diabetes mellitus   . Hypertension   . Enlarged heart   . Goiter   . Nonischemic cardiomyopathy   . Anemia   . Abdominal hernia   . 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 Invasive adenocarcinoma of colon 03/08/2013    11/19 LNs POS, THRU TO ADIPOSE TISSUE    has THYROMEGALY; DIABETES MELLITUS, UNCONTROLLED, WITH RENAL COMPLICATIONS; ANXIETY; DEPRESSION; HYPERTENSION; CARDIOMYOPATHY, DILATED; CONGESTIVE HEART FAILURE; SINUSITIS, CHRONIC; ALLERGIC RHINITIS; GERD; ONYCHIA AND PARONYCHIA OF TOE; LOW BACK PAIN; Diabetes mellitus; Invasive adenocarcinoma of colon; SBO (small bowel obstruction); Type 2  diabetes mellitus; Essential hypertension; and Chronic CHF (congestive heart failure) on her problem list.     is allergic to actos  and ibuprofen.  Monica Allen had no medications administered during this visit.  Past Surgical History  Procedure Laterality Date  . Wisdom tooth extraction    . Laparotomy  12/13/2012    Procedure: EXPLORATORY LAPAROTOMY;  Surgeon: Alvino Chapel, MD;  [facility];  Service: Gynecology;  Laterality: N/A;  . Abdominal hysterectomy  12/13/2012    Procedure: HYSTERECTOMY ABDOMINAL;  Surgeon: Alvino Chapel, MD;  [facility];  Service: Gynecology;  Laterality: N/A;  . Salpingoophorectomy  12/13/2012    Procedure: SALPINGO OOPHORECTOMY;  Surgeon: Alvino Chapel, MD;  [facility];  Service: Gynecology;  Laterality: Bilateral;  . Omentectomy  12/13/2012    Procedure: OMENTECTOMY;  Surgeon: Alvino Chapel, MD;  [facility];  Service: Gynecology;  Laterality: N/A;  . Colonoscopy N/A 02/14/2013    Procedure: COLONOSCOPY;  Surgeon: Danie Binder, MD;  [facility];  Service: Endoscopy;  Laterality: N/A;  2:00 PM-moved to [place] notified pt  . Partial colectomy N/A 03/01/2013    Procedure: PARTIAL COLECTOMY;  Surgeon: Jamesetta So, MD;  Location: [facility];  Service: General;  Laterality: N/A;  . Portacath placement Left 03/01/2013    Procedure: INSERTION PORT-A-CATH;  Surgeon: Jamesetta So, MD;  Location: [facility];  Service: General;  Laterality: Left;    Denies any headaches, dizziness, double vision, fevers, chills, night sweats, nausea, vomiting, diarrhea, constipation, chest pain, heart palpitations, shortness of breath, blood in stool, black tarry stool, urinary pain, urinary burning, urinary frequency, hematuria.   PHYSICAL EXAMINATION  ECOG PERFORMANCE STATUS: 0 - Asymptomatic  Filed Vitals:   03/04/15 0956  BP: 149/56  Pulse: 55  Temp: 98.7 F (37.1 C)  Resp: 20    GENERAL:alert, no distress, well nourished, well developed, comfortable, cooperative, obese, smiling and accompanied by husband. SKIN: skin color, texture, turgor  are normal, no rashes or significant lesions, acne facial and upper chest rash HEAD: Normocephalic, No masses, lesions, tenderness or abnormalities EYES: normal, PERRLA EARS: External ears normal OROPHARYNX:lips, buccal mucosa, and tongue normal and mucous membranes are moist  NECK: supple, no adenopathy, thyroid normal size, non-tender, without nodularity, no stridor, non-tender, trachea midline LYMPH:  no palpable lymphadenopathy BREAST:not examined LUNGS: clear to auscultation and percussion HEART: regular rate & rhythm, no murmurs, no gallops, S1 normal and S2 normal ABDOMEN:abdomen soft, non-tender, obese, normal bowel sounds and no masses or organomegaly BACK: Back symmetric, no curvature., No CVA tenderness EXTREMITIES:less then 2 second capillary refill, no joint deformities, effusion, or inflammation, no skin discoloration, no clubbing, no cyanosis  NEURO: alert & oriented x 3 with fluent speech, no focal motor/sensory deficits, gait normal   LABORATORY DATA: CBC    Component Value Date/Time   WBC 6.2 02/18/2015 1115   RBC 4.46 02/18/2015 1115   HGB 11.9* 02/18/2015 1115   HCT 37.3 02/18/2015 1115   PLT 141* 02/18/2015 1115   MCV 83.6 02/18/2015 1115   MCH 26.7 02/18/2015 1115   MCHC 31.9 02/18/2015 1115   RDW 14.9 02/18/2015 1115   LYMPHSABS 2.0 02/18/2015 1115   MONOABS 0.5 02/18/2015 1115   EOSABS 0.2 02/18/2015 1115   BASOSABS 0.0 02/18/2015 1115      Chemistry      Component Value Date/Time   NA 140 02/18/2015 1128   K 3.7 02/18/2015 1128   CL 107 02/18/2015  1128   CO2 25 02/18/2015 1128   BUN 14 02/18/2015 1128   CREATININE 0.60 02/18/2015 1128      Component Value Date/Time   CALCIUM 8.8 02/18/2015 1128   ALKPHOS 56 02/18/2015 1128   AST 21 02/18/2015 1128   ALT 21 02/18/2015 1128   BILITOT 0.4 02/18/2015 1128     Lab Results  Component Value Date   CEA 2.8 10/03/2014     ASSESSMENT AND PLAN:  Invasive adenocarcinoma of colon 59 year old  female with stage IV colon cancer currently on single agent Vectibux.   She experienced a Grade 2, Panitumumab induced acne rash that is improving with Doxycycline and steroid cream.  It is down to a grade 1.  She reports significant improvement in rash.  No role for dose reduction at this time as this rash is expected with Panitumumab.  She is on appropriate treatment with improvement.   Pre-chemotherapy labs as planned today.  Return in 2 weeks for follow-up, pre-chemo labs, and treatment.    THERAPY PLAN:  Continue with therapy as planned and monitor for toxicities of treatment and progression of disease.  All questions were answered. The patient knows to call the clinic with any problems, questions or concerns. We can certainly see the patient much sooner if necessary.  Patient and plan discussed with Dr. Ancil Linsey and she is in agreement with the aforementioned.   This note is electronically signed by: Robynn Pane 03/04/2015 10:37 AM

## 2015-03-02 NOTE — Assessment & Plan Note (Addendum)
59 year old female with stage IV colon cancer currently on single agent Vectibux.   She experienced a Grade 2, Panitumumab induced acne rash that is improving with Doxycycline and steroid cream.  It is down to a grade 1.  She reports significant improvement in rash.  No role for dose reduction at this time as this rash is expected with Panitumumab.  She is on appropriate treatment with improvement.   Pre-chemotherapy labs as planned today.  Refill on Percocet today.  Return in 2 weeks for follow-up, pre-chemo labs, and treatment.

## 2015-03-04 ENCOUNTER — Encounter (HOSPITAL_COMMUNITY): Payer: Self-pay | Admitting: Oncology

## 2015-03-04 ENCOUNTER — Encounter (HOSPITAL_BASED_OUTPATIENT_CLINIC_OR_DEPARTMENT_OTHER): Payer: BLUE CROSS/BLUE SHIELD | Admitting: Oncology

## 2015-03-04 ENCOUNTER — Encounter (HOSPITAL_BASED_OUTPATIENT_CLINIC_OR_DEPARTMENT_OTHER): Payer: BLUE CROSS/BLUE SHIELD

## 2015-03-04 VITALS — BP 149/56 | HR 55 | Temp 98.7°F | Resp 20 | Wt 253.8 lb

## 2015-03-04 VITALS — BP 134/51 | HR 58 | Temp 98.4°F | Resp 16

## 2015-03-04 DIAGNOSIS — C187 Malignant neoplasm of sigmoid colon: Secondary | ICD-10-CM

## 2015-03-04 DIAGNOSIS — I10 Essential (primary) hypertension: Secondary | ICD-10-CM

## 2015-03-04 DIAGNOSIS — C189 Malignant neoplasm of colon, unspecified: Secondary | ICD-10-CM

## 2015-03-04 DIAGNOSIS — Z5112 Encounter for antineoplastic immunotherapy: Secondary | ICD-10-CM | POA: Diagnosis not present

## 2015-03-04 LAB — CBC WITH DIFFERENTIAL/PLATELET
Basophils Absolute: 0 10*3/uL (ref 0.0–0.1)
Basophils Relative: 0 % (ref 0–1)
EOS ABS: 0.3 10*3/uL (ref 0.0–0.7)
Eosinophils Relative: 4 % (ref 0–5)
HCT: 38.5 % (ref 36.0–46.0)
Hemoglobin: 12.2 g/dL (ref 12.0–15.0)
LYMPHS ABS: 2.6 10*3/uL (ref 0.7–4.0)
LYMPHS PCT: 39 % (ref 12–46)
MCH: 26.4 pg (ref 26.0–34.0)
MCHC: 31.7 g/dL (ref 30.0–36.0)
MCV: 83.3 fL (ref 78.0–100.0)
MONO ABS: 0.5 10*3/uL (ref 0.1–1.0)
Monocytes Relative: 7 % (ref 3–12)
Neutro Abs: 3.4 10*3/uL (ref 1.7–7.7)
Neutrophils Relative %: 50 % (ref 43–77)
PLATELETS: 160 10*3/uL (ref 150–400)
RBC: 4.62 MIL/uL (ref 3.87–5.11)
RDW: 15.3 % (ref 11.5–15.5)
WBC: 6.7 10*3/uL (ref 4.0–10.5)

## 2015-03-04 LAB — COMPREHENSIVE METABOLIC PANEL
ALK PHOS: 52 U/L (ref 39–117)
ALT: 17 U/L (ref 0–35)
AST: 18 U/L (ref 0–37)
Albumin: 3.4 g/dL — ABNORMAL LOW (ref 3.5–5.2)
Anion gap: 8 (ref 5–15)
BUN: 11 mg/dL (ref 6–23)
CALCIUM: 8.7 mg/dL (ref 8.4–10.5)
CHLORIDE: 105 mmol/L (ref 96–112)
CO2: 26 mmol/L (ref 19–32)
Creatinine, Ser: 0.5 mg/dL (ref 0.50–1.10)
GFR calc non Af Amer: >90 mL/min (ref >90)
GLUCOSE: 124 mg/dL — AB (ref 70–99)
Potassium: 3.8 mmol/L (ref 3.5–5.1)
Sodium: 139 mmol/L (ref 135–145)
TOTAL PROTEIN: 6.6 g/dL (ref 6.0–8.3)
Total Bilirubin: 0.5 mg/dL (ref 0.3–1.2)

## 2015-03-04 LAB — MAGNESIUM: MAGNESIUM: 1.7 mg/dL (ref 1.5–2.5)

## 2015-03-04 MED ORDER — PANITUMUMAB CHEMO INJECTION 100 MG/5ML
6.0000 mg/kg | Freq: Once | INTRAVENOUS | Status: AC
  Administered 2015-03-04: 700 mg via INTRAVENOUS
  Filled 2015-03-04: qty 35

## 2015-03-04 MED ORDER — RAMIPRIL 10 MG PO CAPS: 10.0000 mg | ORAL_CAPSULE | Freq: Every day | ORAL | Status: AC

## 2015-03-04 MED ORDER — SODIUM CHLORIDE 0.9 % IV SOLN
Freq: Once | INTRAVENOUS | Status: AC
  Administered 2015-03-04: 12:00:00 via INTRAVENOUS

## 2015-03-04 MED ORDER — OXYCODONE-ACETAMINOPHEN 5-325 MG PO TABS: ORAL_TABLET | ORAL | Status: DC

## 2015-03-04 MED ORDER — HEPARIN SOD (PORK) LOCK FLUSH 100 UNIT/ML IV SOLN
INTRAVENOUS | Status: AC
  Filled 2015-03-04: qty 5

## 2015-03-04 MED ORDER — HEPARIN SOD (PORK) LOCK FLUSH 100 UNIT/ML IV SOLN
500.0000 [IU] | Freq: Once | INTRAVENOUS | Status: AC | PRN
  Administered 2015-03-04: 500 [IU]

## 2015-03-04 MED ORDER — SODIUM CHLORIDE 0.9 % IJ SOLN
10.0000 mL | INTRAMUSCULAR | Status: DC | PRN
  Administered 2015-03-04: 10 mL
  Filled 2015-03-04: qty 10

## 2015-03-04 NOTE — Patient Instructions (Signed)
.  [facility] Discharge Instructions for Patients Receiving Chemotherapy  Today you received the following chemotherapy agents vectibex Please call the clinic if you have any questions or concerns  To help prevent nausea and vomiting after your treatment, we encourage you to take your nausea medication .   If you develop nausea and vomiting, or diarrhea that is not controlled by your medication, call the clinic.  The clinic phone number is [phone]. Office hours are Monday-Friday 8:30am-5:00pm.  BELOW ARE SYMPTOMS THAT SHOULD BE REPORTED IMMEDIATELY:  *FEVER GREATER THAN 101.0 F  *CHILLS WITH OR WITHOUT FEVER  NAUSEA AND VOMITING THAT IS NOT CONTROLLED WITH YOUR NAUSEA MEDICATION  *UNUSUAL SHORTNESS OF BREATH  *UNUSUAL BRUISING OR BLEEDING  TENDERNESS IN MOUTH AND THROAT WITH OR WITHOUT PRESENCE OF ULCERS  *URINARY PROBLEMS  *BOWEL PROBLEMS  UNUSUAL RASH Items with * indicate a potential emergency and should be followed up as soon as possible. If you have an emergency after office hours please contact your primary care physician or go to the nearest emergency department.  Please call the clinic during office hours if you have any questions or concerns.   You may also contact the Patient Navigator at [phone] should you have any questions or need assistance in obtaining follow up care. _____________________________________________________________________ Have you asked about our STAR program?    STAR stands for Survivorship Training and Rehabilitation, and this is a nationally recognized cancer care program that focuses on survivorship and rehabilitation.  Cancer and cancer treatments may cause problems, such as, pain, making you feel tired and keeping you from doing the things that you need or want to do. Cancer rehabilitation can help. Our goal is to reduce these troubling effects and help you have the best quality of life possible.  You may receive  a survey from a nurse that asks questions about your current state of health.  Based on the survey results, all eligible patients will be referred to the [facility] program for an evaluation so we can better serve you! A frequently asked questions sheet is available upon request.

## 2015-03-04 NOTE — Patient Instructions (Signed)
..  [place] at [facility] Discharge Instructions  RECOMMENDATIONS MADE BY THE CONSULTANT AND ANY TEST RESULTS WILL BE SENT TO YOUR REFERRING PHYSICIAN.  Exam and discussion with Robynn Pane, PA-C. Continue treatment plan as scheduled. Return May 9. 2016 to see Dr. Whitney Muse. Refill for pain meds given.  Please take as directed. Call for any new symptoms, questions, or concerns.   Thank you for choosing [place] at [facility] to provide your oncology and hematology care.  To afford each patient quality time with our provider, please arrive at least 15 minutes before your scheduled appointment time.    You need to re-schedule your appointment should you arrive 10 or more minutes late.  We strive to give you quality time with our providers, and arriving late affects you and other patients whose appointments are after yours.  Also, if you no show three or more times for appointments you may be dismissed from the clinic at the providers discretion.     Again, thank you for choosing [facility].  Our hope is that these requests will decrease the amount of time that you wait before being seen by our physicians.       _____________________________________________________________  Should you have questions after your visit to [facility], please contact our office at [phone] between the hours of 8:30 a.m. and 4:30 p.m.  Voicemails left after 4:30 p.m. will not be returned until the following business day.  For prescription refill requests, have your pharmacy contact our office.

## 2015-03-04 NOTE — Progress Notes (Signed)
Monica Allen Tolerated chemotherapy well today.  Discharged ambulatory

## 2015-03-04 NOTE — Addendum Note (Signed)
Addended by: Baird Cancer on: 03/04/2015 01:43 PM   Modules accepted: Orders

## 2015-03-18 ENCOUNTER — Encounter (HOSPITAL_COMMUNITY): Payer: Self-pay | Admitting: Hematology & Oncology

## 2015-03-18 ENCOUNTER — Encounter (HOSPITAL_BASED_OUTPATIENT_CLINIC_OR_DEPARTMENT_OTHER): Payer: BLUE CROSS/BLUE SHIELD | Admitting: Hematology & Oncology

## 2015-03-18 ENCOUNTER — Encounter (HOSPITAL_COMMUNITY): Payer: BLUE CROSS/BLUE SHIELD | Attending: Oncology

## 2015-03-18 ENCOUNTER — Other Ambulatory Visit (HOSPITAL_COMMUNITY): Payer: Self-pay | Admitting: Oncology

## 2015-03-18 VITALS — BP 133/58 | HR 56 | Temp 98.9°F | Resp 16 | Wt 255.3 lb

## 2015-03-18 VITALS — BP 149/49 | HR 64 | Temp 98.4°F | Resp 16

## 2015-03-18 DIAGNOSIS — C182 Malignant neoplasm of ascending colon: Secondary | ICD-10-CM | POA: Diagnosis not present

## 2015-03-18 DIAGNOSIS — C787 Secondary malignant neoplasm of liver and intrahepatic bile duct: Secondary | ICD-10-CM | POA: Diagnosis not present

## 2015-03-18 DIAGNOSIS — C189 Malignant neoplasm of colon, unspecified: Secondary | ICD-10-CM

## 2015-03-18 DIAGNOSIS — Z5112 Encounter for antineoplastic immunotherapy: Secondary | ICD-10-CM

## 2015-03-18 DIAGNOSIS — R300 Dysuria: Secondary | ICD-10-CM

## 2015-03-18 DIAGNOSIS — M5489 Other dorsalgia: Secondary | ICD-10-CM | POA: Diagnosis not present

## 2015-03-18 DIAGNOSIS — R21 Rash and other nonspecific skin eruption: Secondary | ICD-10-CM

## 2015-03-18 DIAGNOSIS — G629 Polyneuropathy, unspecified: Secondary | ICD-10-CM

## 2015-03-18 DIAGNOSIS — C187 Malignant neoplasm of sigmoid colon: Secondary | ICD-10-CM | POA: Diagnosis not present

## 2015-03-18 LAB — URINALYSIS, ROUTINE W REFLEX MICROSCOPIC
Bilirubin Urine: NEGATIVE
GLUCOSE, UA: NEGATIVE mg/dL
Hgb urine dipstick: NEGATIVE
Ketones, ur: NEGATIVE mg/dL
LEUKOCYTES UA: NEGATIVE
NITRITE: NEGATIVE
Specific Gravity, Urine: >1.03 — ABNORMAL HIGH (ref 1.005–1.030)
UROBILINOGEN UA: 0.2 mg/dL (ref 0.0–1.0)
pH: 6 (ref 5.0–8.0)

## 2015-03-18 LAB — MAGNESIUM: Magnesium: 1.8 mg/dL (ref 1.7–2.4)

## 2015-03-18 LAB — COMPREHENSIVE METABOLIC PANEL
ALT: 19 U/L (ref 14–54)
ANION GAP: 7 (ref 5–15)
AST: 22 U/L (ref 15–41)
Albumin: 3.5 g/dL (ref 3.5–5.0)
Alkaline Phosphatase: 61 U/L (ref 38–126)
BUN: 17 mg/dL (ref 6–20)
CHLORIDE: 105 mmol/L (ref 101–111)
CO2: 28 mmol/L (ref 22–32)
CREATININE: 0.75 mg/dL (ref 0.44–1.00)
Calcium: 8.9 mg/dL (ref 8.9–10.3)
GLUCOSE: 185 mg/dL — AB (ref 70–99)
Potassium: 4.2 mmol/L (ref 3.5–5.1)
Sodium: 140 mmol/L (ref 135–145)
Total Bilirubin: 0.5 mg/dL (ref 0.3–1.2)
Total Protein: 7 g/dL (ref 6.5–8.1)

## 2015-03-18 LAB — CBC WITH DIFFERENTIAL/PLATELET
BASOS ABS: 0 10*3/uL (ref 0.0–0.1)
Basophils Relative: 0 % (ref 0–1)
EOS PCT: 3 % (ref 0–5)
Eosinophils Absolute: 0.2 10*3/uL (ref 0.0–0.7)
HCT: 38.9 % (ref 36.0–46.0)
Hemoglobin: 12.3 g/dL (ref 12.0–15.0)
LYMPHS PCT: 34 % (ref 12–46)
Lymphs Abs: 2.5 10*3/uL (ref 0.7–4.0)
MCH: 26.2 pg (ref 26.0–34.0)
MCHC: 31.6 g/dL (ref 30.0–36.0)
MCV: 82.8 fL (ref 78.0–100.0)
Monocytes Absolute: 0.4 10*3/uL (ref 0.1–1.0)
Monocytes Relative: 6 % (ref 3–12)
NEUTROS PCT: 57 % (ref 43–77)
Neutro Abs: 4.2 10*3/uL (ref 1.7–7.7)
PLATELETS: 151 10*3/uL (ref 150–400)
RBC: 4.7 MIL/uL (ref 3.87–5.11)
RDW: 15.4 % (ref 11.5–15.5)
WBC: 7.3 10*3/uL (ref 4.0–10.5)

## 2015-03-18 LAB — URINE MICROSCOPIC-ADD ON

## 2015-03-18 MED ORDER — SODIUM CHLORIDE 0.9 % IV SOLN
6.0000 mg/kg | Freq: Once | INTRAVENOUS | Status: AC
  Administered 2015-03-18: 700 mg via INTRAVENOUS
  Filled 2015-03-18: qty 35

## 2015-03-18 MED ORDER — OXYCODONE-ACETAMINOPHEN 5-325 MG PO TABS: ORAL_TABLET | ORAL | Status: DC

## 2015-03-18 MED ORDER — HEPARIN SOD (PORK) LOCK FLUSH 100 UNIT/ML IV SOLN
500.0000 [IU] | Freq: Once | INTRAVENOUS | Status: AC | PRN
  Administered 2015-03-18: 500 [IU]
  Filled 2015-03-18: qty 5

## 2015-03-18 MED ORDER — SODIUM CHLORIDE 0.9 % IJ SOLN
10.0000 mL | INTRAMUSCULAR | Status: DC | PRN
  Administered 2015-03-18: 10 mL
  Filled 2015-03-18: qty 10

## 2015-03-18 MED ORDER — GABAPENTIN 300 MG PO CAPS: 900.0000 mg | ORAL_CAPSULE | Freq: Two times a day (BID) | ORAL | Status: AC

## 2015-03-18 MED ORDER — SODIUM CHLORIDE 0.9 % IV SOLN
Freq: Once | INTRAVENOUS | Status: AC
  Administered 2015-03-18: 11:00:00 via INTRAVENOUS

## 2015-03-18 NOTE — Patient Instructions (Signed)
..  [place] at [facility] Discharge Instructions  RECOMMENDATIONS MADE BY THE CONSULTANT AND ANY TEST RESULTS WILL BE SENT TO YOUR REFERRING PHYSICIAN.  Continue with treatment plan as scheduled. Return in one month for follow up with Dr. Whitney Muse. Call for any new or worsening symptoms, questions, or concerns.   Thank you for choosing [place] at [facility] to provide your oncology and hematology care.  To afford each patient quality time with our provider, please arrive at least 15 minutes before your scheduled appointment time.    You need to re-schedule your appointment should you arrive 10 or more minutes late.  We strive to give you quality time with our providers, and arriving late affects you and other patients whose appointments are after yours.  Also, if you no show three or more times for appointments you may be dismissed from the clinic at the providers discretion.     Again, thank you for choosing [facility].  Our hope is that these requests will decrease the amount of time that you wait before being seen by our physicians.       _____________________________________________________________  Should you have questions after your visit to [facility], please contact our office at [phone] between the hours of 8:30 a.m. and 4:30 p.m.  Voicemails left after 4:30 p.m. will not be returned until the following business day.  For prescription refill requests, have your pharmacy contact our office.

## 2015-03-18 NOTE — Progress Notes (Signed)
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 Aldean Ast on 12/13/2012. At that time he did TAH and BSO with lysis of adhesions, partial omentectomy, and resection of pelvic peritoneal implant. At the completion of the surgical procedure there is no evidence of residual disease. Her CEA is within the normal range. Her cancer however did not mark as ovarian in origin and a subsequent colonoscopy revealed a right ascending colon cancer. This was discovered by Dr. Trinda Pascal on 02/14/2013. It was near circumferential.   Kras Wildtype  CEA not elevated   DIAGNOSIS:  Stage IV Colon cancer   CURRENT THERAPY: Panitumumab   INTERVAL HISTORY: Monica Allen 59 y.o. 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 Aldean Ast on 12/13/2012. At that time he did TAH and BSO with lysis of adhesions, partial omentectomy, and resection of pelvic peritoneal implant. At the completion of the surgical procedure there is no evidence of residual disease. Her CEA is within the normal range. Her cancer however did not mark as ovarian in origin and a subsequent colonoscopy revealed a right ascending colon cancer. This was discovered by Dr. Barney Drain on 02/14/2013. It was near circumferential.   She has visible drug related rash on her face, chest, and back. She says her eyebrows are becoming more coarse. She has not had any changes to her nails or eyelashes.  She has been experiencing back pain for the last month which makes doing laundry and dishes troublesome. She denies any pain during urination. She feels as though she had more diarrhea after her last treatment. She did not have any immodium left. Her diarrhea has resolved.      Invasive  adenocarcinoma of colon   01/02/2013 Initial Diagnosis Left ovary and fallopian tube is positive for invasive adenocarcinoma with LVI and positive pelvis biopsy    02/14/2013 Procedure Ascending colon biopsy revealing invasive adenocarcinoma.  KRAS wildtype   03/01/2013 Surgery Left segmental resection showing invasive adenocarcinoma through the muscularis propria into the pericolonoc fat involving the serosa with LVI.  Clear margins.  11/19 positive lymph nodes for disease   04/04/2013 - 09/12/2013 Chemotherapy FOLFOX + Avastin x 12 cycles   09/26/2013 Imaging CT CAP- No definite signs of metastatic disease in the chest, abdomen or pelvis   10/02/2013 - 10/24/2014 Chemotherapy Avastin + Xeloda maintenance.  Avstin dose decreased to 7.5 mg/kg on 01/07/2014 due to poorly controlled HTN.   12/21/2013 Imaging CT CAP- Negative for recurrent or metastatic disease in the chest, abdomen or pelvis.    03/14/2014 Imaging CT CAP- No evidence of thoracic metastasis. No evidence metastasis within the abdomen or pelvis.   07/25/2014 Imaging CT CAP-he slight increase in mediastinal and peripancreatic lymph nodes with a new nodule in the right middle lobe most compatible with inflammatory processes.   11/07/2014 Imaging Mildly increased lymphadenopathy within the chest and abdomen, consistent with metastatic disease.Increased size of right middle lobe pulmonary nodule.Stable multinodular goiter and large anterior abdominal wall hernia.   12/23/2014 - 12/27/2014 Hospital Admission SBO, not felt to be "malignancy related" but secondary to chronically incarcerated ventral incisional hernia   01/07/2015 -  Chemotherapy Panitumumab started   02/04/2015 Adverse Reaction Panitumumab-induced acne rash, expected with treatment.  Past Medical History  Diagnosis Date  . Diabetes mellitus   . Hypertension   . Enlarged heart   . Goiter   . Nonischemic cardiomyopathy   . Anemia   . Abdominal hernia   . Cancer 2013    right colon-  ovarian  . 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     is allergic to actos and ibuprofen.  Ms. Charon had no medications administered during this visit.  Past Surgical History  Procedure Laterality Date  . Wisdom tooth extraction    . Laparotomy  12/13/2012    Procedure: EXPLORATORY LAPAROTOMY;  Surgeon: Alvino Chapel, MD;  Location: [facility];  Service: Gynecology;  Laterality: N/A;  . Abdominal hysterectomy  12/13/2012    Procedure: HYSTERECTOMY ABDOMINAL;  Surgeon: Alvino Chapel, MD;  Location: [facility];  Service: Gynecology;  Laterality: N/A;  . Salpingoophorectomy  12/13/2012    Procedure: SALPINGO OOPHORECTOMY;  Surgeon: Alvino Chapel, MD;  Location: [facility];  Service: Gynecology;  Laterality: Bilateral;  . Omentectomy  12/13/2012    Procedure: OMENTECTOMY;  Surgeon: Alvino Chapel, MD;  Location: [facility];  Service: Gynecology;  Laterality: N/A;  . Colonoscopy N/A 02/14/2013    Procedure: COLONOSCOPY;  Surgeon: Danie Binder, MD;  Location: [facility];  Service: Endoscopy;  Laterality: N/A;  2:00 PM-moved to [place] notified pt  . Partial colectomy N/A 03/01/2013    Procedure: PARTIAL COLECTOMY;  Surgeon: Jamesetta So, MD;  Location: [facility];  Service: General;  Laterality: N/A;  . Portacath placement Left 03/01/2013    Procedure: INSERTION PORT-A-CATH;  Surgeon: Jamesetta So, MD;  Location: [facility];  Service: General;  Laterality: Left;    Denies any headaches,  dizziness, double vision, fevers, chills, night sweats, nausea, vomiting, diarrhea, constipation, chest pain, heart palpitations, shortness of breath, blood in stool, black tarry stool, urinary pain, urinary burning, urinary frequency, hematuria. Positive for back pain.  Positive for rash     Drug related rash on face, chest, and back.   PHYSICAL EXAMINATION  ECOG PERFORMANCE STATUS: 0 - Asymptomatic  Filed Vitals:   03/18/15 0856  BP: 133/58  Pulse: 56  Temp: 98.9 F (37.2 C)  Resp: 16    GENERAL:alert, no distress, well nourished, well developed, comfortable, cooperative, obese and smiling SKIN: skin color, texture, turgor are normal, positive GUY:[id number] acne rash of face, chest, and upper back. HEAD: Normocephalic, No masses, lesions, tenderness or abnormalities, rash as described above.     Pustular rash on face (chin, forehead, nose and cheeks) EYES: normal, PERRLA, EOMI, Conjunctiva are pink and non-injected EARS: External ears normal OROPHARYNX:lips, buccal mucosa, and tongue normal and mucous membranes are moist  NECK: supple, no adenopathy, thyroid normal size, non-tender, without nodularity, no stridor, non-tender, trachea midline LYMPH:  no palpable lymphadenopathy BREAST:not examined LUNGS: clear to auscultation and percussion HEART: regular rate & rhythm, no murmurs, no gallops, S1 normal and S2 normal ABDOMEN:abdomen soft, non-tender, obese, normal bowel sounds and no masses or organomegaly BACK: Back symmetric, no curvature., No CVA tenderness EXTREMITIES:less then 2 second capillary refill, no  joint deformities, effusion, or inflammation,  no clubbing, no cyanosis  NEURO: alert & oriented x 3 with fluent speech, no focal motor/sensory deficits, gait normal   LABORATORY DATA: CBC    Component Value Date/Time   WBC 7.3 03/18/2015 1005   RBC 4.70 03/18/2015 1005   HGB 12.3 03/18/2015 1005   HCT 38.9 03/18/2015 1005   PLT 151 03/18/2015 1005    MCV 82.8 03/18/2015 1005   MCH 26.2 03/18/2015 1005   MCHC 31.6 03/18/2015 1005   RDW 15.4 03/18/2015 1005   LYMPHSABS 2.5 03/18/2015 1005   MONOABS 0.4 03/18/2015 1005   EOSABS 0.2 03/18/2015 1005   BASOSABS 0.0 03/18/2015 1005      Chemistry      Component Value Date/Time   NA 140 03/18/2015 1005   K 4.2 03/18/2015 1005   CL 105 03/18/2015 1005   CO2 28 03/18/2015 1005   BUN 17 03/18/2015 1005   CREATININE 0.75 03/18/2015 1005      Component Value Date/Time   CALCIUM 8.9 03/18/2015 1005   ALKPHOS 61 03/18/2015 1005   AST 22 03/18/2015 1005   ALT 19 03/18/2015 1005   BILITOT 0.5 03/18/2015 1005      ASSESSMENT AND PLAN:  Stage IV CRC Drug Rash, grade 1/2 Low back pain  We will continue with current therapy. She is cleared for treatment today. She will return again in 1 month for a formal office visit with labs and chemotherapy. Treatment will be ongoing at 2 week intervals. I again advised her to use moisturizer she will continue on her oral doxycycline and use steroid cream as needed. She has no evidence of nail changes.  I will obtain a UA today given her low back pain. If her urine is negative then we will start with plain films of the back. In regards to additional imaging we will discuss this at her next visit. Her tumor marker, CEA, has never been elevated. Therefore, it has not been a reliable marker to follow her response to therapy.  She is to continue her magnesium supplement. I emphasized this to her today.   Continue with therapy as planned and monitor for toxicities of treatment and progression of disease.   All questions were answered. The patient knows to call the clinic with any problems, questions or concerns. We can certainly see the patient much sooner if necessary.  This document serves as a record of services personally performed by Ancil Linsey, MD. It was created on her behalf by Arlyce Harman, a trained medical scribe. The creation of this  record is based on the scribe's personal observations and the provider's statements to them. This document has been checked and approved by the attending provider.  I have reviewed the above documentation for accuracy and completeness, and I agree with the above. This note was electronically signed.  Ancil Linsey, MD

## 2015-03-18 NOTE — Progress Notes (Signed)
Tolerated chemo well. 

## 2015-03-18 NOTE — Patient Instructions (Signed)
[facility] Discharge Instructions for Patients Receiving Chemotherapy  Today you received the following chemotherapy agents Vectibix.  To help prevent nausea and vomiting after your treatment, we encourage you to take your nausea medication as instructed. If you develop nausea and vomiting that is not controlled by your nausea medication, call the clinic. If it is after clinic hours your family physician or the after hours number for the clinic or go to the Emergency Department. BELOW ARE SYMPTOMS THAT SHOULD BE REPORTED IMMEDIATELY:  *FEVER GREATER THAN 101.0 F  *CHILLS WITH OR WITHOUT FEVER  NAUSEA AND VOMITING THAT IS NOT CONTROLLED WITH YOUR NAUSEA MEDICATION  *UNUSUAL SHORTNESS OF BREATH  *UNUSUAL BRUISING OR BLEEDING  TENDERNESS IN MOUTH AND THROAT WITH OR WITHOUT PRESENCE OF ULCERS  *URINARY PROBLEMS  *BOWEL PROBLEMS  UNUSUAL RASH Items with * indicate a potential emergency and should be followed up as soon as possible. Return as scheduled. I have been informed and understand all the instructions given to me. I know to contact the clinic, my physician, or go to the Emergency Department if any problems should occur. I do not have any questions at this time, but understand that I may call the clinic during office hours or the Patient Navigator at [phone] should I have any questions or need assistance in obtaining follow up care.    __________________________________________  _____________  __________ Signature of Patient or Authorized Representative            Date                   Time    __________________________________________ Nurse's Signature

## 2015-03-19 LAB — URINE CULTURE: Colony Count: 6000

## 2015-04-01 ENCOUNTER — Encounter (HOSPITAL_COMMUNITY): Payer: BLUE CROSS/BLUE SHIELD | Attending: Hematology & Oncology

## 2015-04-01 ENCOUNTER — Encounter (HOSPITAL_COMMUNITY): Payer: Self-pay

## 2015-04-01 VITALS — BP 137/84 | HR 61 | Temp 97.7°F | Resp 18 | Wt 257.8 lb

## 2015-04-01 DIAGNOSIS — C189 Malignant neoplasm of colon, unspecified: Secondary | ICD-10-CM

## 2015-04-01 DIAGNOSIS — Z5112 Encounter for antineoplastic immunotherapy: Secondary | ICD-10-CM

## 2015-04-01 DIAGNOSIS — C182 Malignant neoplasm of ascending colon: Secondary | ICD-10-CM

## 2015-04-01 LAB — CBC WITH DIFFERENTIAL/PLATELET
BASOS PCT: 0 % (ref 0–1)
Basophils Absolute: 0 10*3/uL (ref 0.0–0.1)
EOS PCT: 3 % (ref 0–5)
Eosinophils Absolute: 0.2 10*3/uL (ref 0.0–0.7)
HCT: 38.4 % (ref 36.0–46.0)
Hemoglobin: 12.2 g/dL (ref 12.0–15.0)
Lymphocytes Relative: 45 % (ref 12–46)
Lymphs Abs: 3.1 10*3/uL (ref 0.7–4.0)
MCH: 26.2 pg (ref 26.0–34.0)
MCHC: 31.8 g/dL (ref 30.0–36.0)
MCV: 82.4 fL (ref 78.0–100.0)
Monocytes Absolute: 0.4 10*3/uL (ref 0.1–1.0)
Monocytes Relative: 6 % (ref 3–12)
Neutro Abs: 3.2 10*3/uL (ref 1.7–7.7)
Neutrophils Relative %: 46 % (ref 43–77)
Platelets: 167 10*3/uL (ref 150–400)
RBC: 4.66 MIL/uL (ref 3.87–5.11)
RDW: 15.3 % (ref 11.5–15.5)
WBC: 6.9 10*3/uL (ref 4.0–10.5)

## 2015-04-01 LAB — COMPREHENSIVE METABOLIC PANEL
ALT: 16 U/L (ref 14–54)
AST: 21 U/L (ref 15–41)
Albumin: 3.7 g/dL (ref 3.5–5.0)
Alkaline Phosphatase: 56 U/L (ref 38–126)
Anion gap: 10 (ref 5–15)
BUN: 12 mg/dL (ref 6–20)
CO2: 27 mmol/L (ref 22–32)
Calcium: 8.9 mg/dL (ref 8.9–10.3)
Chloride: 102 mmol/L (ref 101–111)
Creatinine, Ser: 0.75 mg/dL (ref 0.44–1.00)
Glucose, Bld: 190 mg/dL — ABNORMAL HIGH (ref 65–99)
Potassium: 3.9 mmol/L (ref 3.5–5.1)
Sodium: 139 mmol/L (ref 135–145)
Total Bilirubin: 0.4 mg/dL (ref 0.3–1.2)
Total Protein: 7 g/dL (ref 6.5–8.1)

## 2015-04-01 LAB — MAGNESIUM: Magnesium: 1.7 mg/dL (ref 1.7–2.4)

## 2015-04-01 MED ORDER — SODIUM CHLORIDE 0.9 % IV SOLN
6.0000 mg/kg | Freq: Once | INTRAVENOUS | Status: AC
  Administered 2015-04-01: 700 mg via INTRAVENOUS
  Filled 2015-04-01: qty 35

## 2015-04-01 MED ORDER — HEPARIN SOD (PORK) LOCK FLUSH 100 UNIT/ML IV SOLN
500.0000 [IU] | Freq: Once | INTRAVENOUS | Status: AC | PRN
  Administered 2015-04-01: 500 [IU]
  Filled 2015-04-01: qty 5

## 2015-04-01 MED ORDER — SODIUM CHLORIDE 0.9 % IV SOLN
Freq: Once | INTRAVENOUS | Status: AC
  Administered 2015-04-01: 11:00:00 via INTRAVENOUS

## 2015-04-01 MED ORDER — SODIUM CHLORIDE 0.9 % IJ SOLN: 10.0000 mL | INTRAMUSCULAR | Status: DC | PRN

## 2015-04-01 NOTE — Patient Instructions (Signed)
[facility] Discharge Instructions for Patients Receiving Chemotherapy  Today you received the following chemotherapy agents vectibix Please follow up as scheduled Call the clinic if you have any questions or concerns  To help prevent nausea and vomiting after your treatment, we encourage you to take your nausea medication .   If you develop nausea and vomiting, or diarrhea that is not controlled by your medication, call the clinic.  The clinic phone number is [phone]. Office hours are Monday-Friday 8:30am-5:00pm.  BELOW ARE SYMPTOMS THAT SHOULD BE REPORTED IMMEDIATELY:  *FEVER GREATER THAN 101.0 F  *CHILLS WITH OR WITHOUT FEVER  NAUSEA AND VOMITING THAT IS NOT CONTROLLED WITH YOUR NAUSEA MEDICATION  *UNUSUAL SHORTNESS OF BREATH  *UNUSUAL BRUISING OR BLEEDING  TENDERNESS IN MOUTH AND THROAT WITH OR WITHOUT PRESENCE OF ULCERS  *URINARY PROBLEMS  *BOWEL PROBLEMS  UNUSUAL RASH Items with * indicate a potential emergency and should be followed up as soon as possible. If you have an emergency after office hours please contact your primary care physician or go to the nearest emergency department.  Please call the clinic during office hours if you have any questions or concerns.   You may also contact the Patient Navigator at [phone] should you have any questions or need assistance in obtaining follow up care. _____________________________________________________________________ Have you asked about our STAR program?    STAR stands for Survivorship Training and Rehabilitation, and this is a nationally recognized cancer care program that focuses on survivorship and rehabilitation.  Cancer and cancer treatments may cause problems, such as, pain, making you feel tired and keeping you from doing the things that you need or want to do. Cancer rehabilitation can help. Our goal is to reduce these troubling effects and help you have the best quality of life  possible.  You may receive a survey from a nurse that asks questions about your current state of health.  Based on the survey results, all eligible patients will be referred to the [facility] program for an evaluation so we can better serve you! A frequently asked questions sheet is available upon request.

## 2015-04-01 NOTE — Progress Notes (Signed)
Monica Allen Tolerated chemotherapy well today Discharged ambulatory

## 2015-04-15 ENCOUNTER — Ambulatory Visit (HOSPITAL_COMMUNITY): Payer: BLUE CROSS/BLUE SHIELD | Admitting: Hematology & Oncology

## 2015-04-15 ENCOUNTER — Encounter (HOSPITAL_COMMUNITY): Payer: BLUE CROSS/BLUE SHIELD

## 2015-04-17 ENCOUNTER — Ambulatory Visit (HOSPITAL_COMMUNITY): Payer: BLUE CROSS/BLUE SHIELD | Admitting: Hematology & Oncology

## 2015-04-17 ENCOUNTER — Encounter (HOSPITAL_COMMUNITY): Payer: BLUE CROSS/BLUE SHIELD

## 2015-04-18 ENCOUNTER — Encounter (HOSPITAL_COMMUNITY): Payer: Self-pay | Admitting: Hematology & Oncology

## 2015-04-18 ENCOUNTER — Encounter (HOSPITAL_COMMUNITY): Payer: BLUE CROSS/BLUE SHIELD | Attending: Oncology | Admitting: Hematology & Oncology

## 2015-04-18 ENCOUNTER — Encounter (HOSPITAL_COMMUNITY): Payer: BLUE CROSS/BLUE SHIELD | Attending: Hematology & Oncology

## 2015-04-18 VITALS — BP 158/90 | HR 62 | Temp 98.0°F | Resp 18

## 2015-04-18 DIAGNOSIS — C7960 Secondary malignant neoplasm of unspecified ovary: Secondary | ICD-10-CM | POA: Diagnosis not present

## 2015-04-18 DIAGNOSIS — C189 Malignant neoplasm of colon, unspecified: Secondary | ICD-10-CM | POA: Insufficient documentation

## 2015-04-18 DIAGNOSIS — C182 Malignant neoplasm of ascending colon: Secondary | ICD-10-CM | POA: Diagnosis not present

## 2015-04-18 DIAGNOSIS — Z5112 Encounter for antineoplastic immunotherapy: Secondary | ICD-10-CM

## 2015-04-18 DIAGNOSIS — M545 Low back pain: Secondary | ICD-10-CM | POA: Diagnosis not present

## 2015-04-18 DIAGNOSIS — L270 Generalized skin eruption due to drugs and medicaments taken internally: Secondary | ICD-10-CM

## 2015-04-18 LAB — COMPREHENSIVE METABOLIC PANEL
ALT: 16 U/L (ref 14–54)
AST: 17 U/L (ref 15–41)
Albumin: 3.6 g/dL (ref 3.5–5.0)
Alkaline Phosphatase: 64 U/L (ref 38–126)
Anion gap: 9 (ref 5–15)
BILIRUBIN TOTAL: 0.5 mg/dL (ref 0.3–1.2)
BUN: 9 mg/dL (ref 6–20)
CALCIUM: 8.6 mg/dL — AB (ref 8.9–10.3)
CHLORIDE: 103 mmol/L (ref 101–111)
CO2: 27 mmol/L (ref 22–32)
Creatinine, Ser: 0.49 mg/dL (ref 0.44–1.00)
Glucose, Bld: 269 mg/dL — ABNORMAL HIGH (ref 65–99)
Potassium: 4.1 mmol/L (ref 3.5–5.1)
SODIUM: 139 mmol/L (ref 135–145)
Total Protein: 6.7 g/dL (ref 6.5–8.1)

## 2015-04-18 LAB — CBC WITH DIFFERENTIAL/PLATELET
BASOS PCT: 0 % (ref 0–1)
Basophils Absolute: 0 10*3/uL (ref 0.0–0.1)
EOS PCT: 2 % (ref 0–5)
Eosinophils Absolute: 0.1 10*3/uL (ref 0.0–0.7)
HCT: 37.7 % (ref 36.0–46.0)
Hemoglobin: 12.2 g/dL (ref 12.0–15.0)
LYMPHS ABS: 2.3 10*3/uL (ref 0.7–4.0)
LYMPHS PCT: 43 % (ref 12–46)
MCH: 26.6 pg (ref 26.0–34.0)
MCHC: 32.4 g/dL (ref 30.0–36.0)
MCV: 82.1 fL (ref 78.0–100.0)
MONOS PCT: 8 % (ref 3–12)
Monocytes Absolute: 0.4 10*3/uL (ref 0.1–1.0)
NEUTROS ABS: 2.5 10*3/uL (ref 1.7–7.7)
NEUTROS PCT: 47 % (ref 43–77)
PLATELETS: 141 10*3/uL — AB (ref 150–400)
RBC: 4.59 MIL/uL (ref 3.87–5.11)
RDW: 14.7 % (ref 11.5–15.5)
WBC: 5.4 10*3/uL (ref 4.0–10.5)

## 2015-04-18 LAB — MAGNESIUM: MAGNESIUM: 1.5 mg/dL — AB (ref 1.7–2.4)

## 2015-04-18 MED ORDER — SODIUM CHLORIDE 0.9 % IV SOLN
Freq: Once | INTRAVENOUS | Status: AC
  Administered 2015-04-18: 11:00:00 via INTRAVENOUS

## 2015-04-18 MED ORDER — MAGNESIUM OXIDE 400 (241.3 MG) MG PO TABS
400.0000 mg | ORAL_TABLET | Freq: Once | ORAL | Status: AC
  Administered 2015-04-18: 400 mg via ORAL
  Filled 2015-04-18: qty 1

## 2015-04-18 MED ORDER — MAGNESIUM OXIDE 400 MG PO TABS: ORAL_TABLET | ORAL | Status: DC

## 2015-04-18 MED ORDER — SODIUM CHLORIDE 0.9 % IJ SOLN
10.0000 mL | INTRAMUSCULAR | Status: DC | PRN
  Administered 2015-04-18: 10 mL
  Filled 2015-04-18: qty 10

## 2015-04-18 MED ORDER — HEPARIN SOD (PORK) LOCK FLUSH 100 UNIT/ML IV SOLN
500.0000 [IU] | Freq: Once | INTRAVENOUS | Status: AC | PRN
  Administered 2015-04-18: 500 [IU]

## 2015-04-18 MED ORDER — HEPARIN SOD (PORK) LOCK FLUSH 100 UNIT/ML IV SOLN
INTRAVENOUS | Status: AC
  Filled 2015-04-18: qty 5

## 2015-04-18 MED ORDER — SODIUM CHLORIDE 0.9 % IV SOLN
6.0000 mg/kg | Freq: Once | INTRAVENOUS | Status: AC
  Administered 2015-04-18: 700 mg via INTRAVENOUS
  Filled 2015-04-18: qty 35

## 2015-04-18 MED ORDER — POTASSIUM CHLORIDE 20 MEQ PO PACK: 20.0000 meq | PACK | Freq: Every day | ORAL | Status: AC

## 2015-04-18 NOTE — Patient Instructions (Signed)
..  [place] at [facility] Discharge Instructions  RECOMMENDATIONS MADE BY THE CONSULTANT AND ANY TEST RESULTS WILL BE SENT TO YOUR REFERRING PHYSICIAN.  Follow up appointment with the MD in one month, chemotherapy every two weeks as scheduled.    Thank you for choosing [place] at [facility] to provide your oncology and hematology care.  To afford each patient quality time with our provider, please arrive at least 15 minutes before your scheduled appointment time.    You need to re-schedule your appointment should you arrive 10 or more minutes late.  We strive to give you quality time with our providers, and arriving late affects you and other patients whose appointments are after yours.  Also, if you no show three or more times for appointments you may be dismissed from the clinic at the providers discretion.     Again, thank you for choosing [facility].  Our hope is that these requests will decrease the amount of time that you wait before being seen by our physicians.       _____________________________________________________________  Should you have questions after your visit to [facility], please contact our office at [phone] between the hours of 8:30 a.m. and 4:30 p.m.  Voicemails left after 4:30 p.m. will not be returned until the following business day.  For prescription refill requests, have your pharmacy contact our office.

## 2015-04-18 NOTE — Patient Instructions (Signed)
[facility] Discharge Instructions for Patients Receiving Chemotherapy  Today you received the following chemotherapy agents Vectibix.  To help prevent nausea and vomiting after your treatment, we encourage you to take your nausea medication as instructed. If you develop nausea and vomiting that is not controlled by your nausea medication, call the clinic. If it is after clinic hours your family physician or the after hours number for the clinic or go to the Emergency Department. BELOW ARE SYMPTOMS THAT SHOULD BE REPORTED IMMEDIATELY:  *FEVER GREATER THAN 101.0 F  *CHILLS WITH OR WITHOUT FEVER  NAUSEA AND VOMITING THAT IS NOT CONTROLLED WITH YOUR NAUSEA MEDICATION  *UNUSUAL SHORTNESS OF BREATH  *UNUSUAL BRUISING OR BLEEDING  TENDERNESS IN MOUTH AND THROAT WITH OR WITHOUT PRESENCE OF ULCERS  *URINARY PROBLEMS  *BOWEL PROBLEMS  UNUSUAL RASH Items with * indicate a potential emergency and should be followed up as soon as possible.  Return as scheduled.  I have been informed and understand all the instructions given to me. I know to contact the clinic, my physician, or go to the Emergency Department if any problems should occur. I do not have any questions at this time, but understand that I may call the clinic during office hours or the Patient Navigator at [phone] should I have any questions or need assistance in obtaining follow up care.    __________________________________________  _____________  __________ Signature of Patient or Authorized Representative            Date                   Time    __________________________________________ Nurse's Signature

## 2015-04-18 NOTE — Progress Notes (Signed)
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 Aldean Ast on 12/13/2012. At that time he did TAH and BSO with lysis of adhesions, partial omentectomy, and resection of pelvic peritoneal implant. At the completion of the surgical procedure there is no evidence of residual disease. Her CEA is within the normal range. Her cancer however did not mark as ovarian in origin and a subsequent colonoscopy revealed a right ascending colon cancer. This was discovered by Dr. Trinda Pascal on 02/14/2013. It was near circumferential.   Kras Wildtype  CEA not elevated   DIAGNOSIS:  Stage IV Colon cancer   CURRENT THERAPY: Panitumumab   INTERVAL HISTORY: Monica Allen 59 y.o. 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 Aldean Ast on 12/13/2012. At that time he did TAH and BSO with lysis of adhesions, partial omentectomy, and resection of pelvic peritoneal implant. At the completion of the surgical procedure there is no evidence of residual disease. Her CEA is within the normal range. Her cancer however did not mark as ovarian in origin and a subsequent colonoscopy revealed a right ascending colon cancer. This was discovered by Dr. Barney Drain on 02/14/2013. It was near circumferential.   She has visible drug related rash on her face, chest, and back. She says her eyebrows are becoming more coarse. She has not had any changes to her nails or eyelashes.  Today she is here with her husband. She did not take her BP meds yesterday nor today and has elevated pressures but says she will take it 'in a little while'. She says that when she stands or walks a lot, she has lower back pain. This is chronic. She does all of her ADL's. She just returned from  [place].  She takes her magnesium pills 1 time per day.    Invasive adenocarcinoma of colon   01/02/2013 Initial Diagnosis Left ovary and fallopian tube is positive for invasive adenocarcinoma with LVI and positive pelvis biopsy    02/14/2013 Procedure Ascending colon biopsy revealing invasive adenocarcinoma.  KRAS wildtype   03/01/2013 Surgery Left segmental resection showing invasive adenocarcinoma through the muscularis propria into the pericolonoc fat involving the serosa with LVI.  Clear margins.  11/19 positive lymph nodes for disease   04/04/2013 - 09/12/2013 Chemotherapy FOLFOX + Avastin x 12 cycles   09/26/2013 Imaging CT CAP- No definite signs of metastatic disease in the chest, abdomen or pelvis   10/02/2013 - 10/24/2014 Chemotherapy Avastin + Xeloda maintenance.  Avstin dose decreased to 7.5 mg/kg on 01/07/2014 due to poorly controlled HTN.   12/21/2013 Imaging CT CAP- Negative for recurrent or metastatic disease in the chest, abdomen or pelvis.    03/14/2014 Imaging CT CAP- No evidence of thoracic metastasis. No evidence metastasis within the abdomen or pelvis.   07/25/2014 Imaging CT CAP-he slight increase in mediastinal and peripancreatic lymph nodes with a new nodule in the right middle lobe most compatible with inflammatory processes.   11/07/2014 Imaging Mildly increased lymphadenopathy within the chest and abdomen, consistent with metastatic disease.Increased size of right middle lobe pulmonary nodule.Stable multinodular goiter and large anterior abdominal wall hernia.   12/23/2014 - 12/27/2014 Hospital Admission SBO, not felt to be "malignancy related" but secondary to chronically incarcerated ventral incisional hernia  01/07/2015 -  Chemotherapy Panitumumab started   02/04/2015 Adverse Reaction Panitumumab-induced acne rash, expected with treatment.     Past Medical History  Diagnosis Date  . Diabetes mellitus   . Hypertension   . Enlarged heart   . Goiter   . Nonischemic  cardiomyopathy   . Anemia   . Abdominal hernia   . 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     is allergic to actos and ibuprofen.  Monica Allen had no medications administered during this visit.  Past Surgical History  Procedure Laterality Date  . Wisdom tooth extraction    . Laparotomy  12/13/2012    Procedure: EXPLORATORY LAPAROTOMY;  Surgeon: Alvino Chapel, MD;  Location: [facility];  Service: Gynecology;  Laterality: N/A;  . Abdominal hysterectomy  12/13/2012    Procedure: HYSTERECTOMY ABDOMINAL;  Surgeon: Alvino Chapel, MD;  Location: [facility];  Service: Gynecology;  Laterality: N/A;  . Salpingoophorectomy  12/13/2012    Procedure: SALPINGO OOPHORECTOMY;  Surgeon: Alvino Chapel, MD;  Location: [facility];  Service: Gynecology;  Laterality: Bilateral;  . Omentectomy  12/13/2012    Procedure: OMENTECTOMY;  Surgeon: Alvino Chapel, MD;  Location: [facility];  Service: Gynecology;  Laterality: N/A;  . Colonoscopy N/A 02/14/2013    Procedure: COLONOSCOPY;  Surgeon: Danie Binder, MD;  Location: [facility];  Service: Endoscopy;  Laterality: N/A;  2:00 PM-moved to [place] notified pt  . Partial colectomy N/A 03/01/2013    Procedure: PARTIAL COLECTOMY;  Surgeon: Jamesetta So, MD;  Location: [facility];  Service: General;  Laterality: N/A;  . Portacath placement Left 03/01/2013    Procedure: INSERTION PORT-A-CATH;  Surgeon: Jamesetta So, MD;   Location: [facility];  Service: General;  Laterality: Left;    Denies any headaches, dizziness, double vision, fevers, chills, night sweats, nausea, vomiting, diarrhea, constipation, chest pain, heart palpitations, shortness of breath, blood in stool, black tarry stool, urinary pain, urinary burning, urinary frequency, hematuria. Positive for back pain. Alleviated by pain meds  Positive for rash     Drug related rash on face, chest, and back, beginning to dry up 14 point review of systems was performed and is negative except as detailed under history of present illness and above   PHYSICAL EXAMINATION  ECOG PERFORMANCE STATUS: 0 - Asymptomatic  Filed Vitals:   04/18/15 0923  BP: 180/75  Pulse: 63  Temp: 98.4 F (36.9 C)  Resp: 14    GENERAL:alert, no distress, well nourished, well developed, comfortable, cooperative, obese and smiling. Well goomed SKIN: skin color, texture, turgor are normal, positive VEL:[id number] acne rash of face, chest, and upper back. HEAD: Normocephalic, No masses, lesions, tenderness or abnormalities, rash as described above.     Pustular rash on face (chin, forehead, nose and cheeks) Wearing makeup today therefore rash is well concealed EYES: normal, PERRLA, EOMI, Conjunctiva are pink and non-injected EARS: External ears normal OROPHARYNX:lips, buccal mucosa, and tongue normal and mucous membranes are moist  NECK: supple, no adenopathy, thyroid normal size, non-tender, without nodularity,  no stridor, non-tender, trachea midline LYMPH:  no palpable lymphadenopathy BREAST:not examined LUNGS: clear to auscultation and percussion HEART: regular rate & rhythm, no murmurs, no gallops, S1 normal and S2 normal ABDOMEN:abdomen soft, non-tender, obese, normal bowel sounds and no masses or organomegaly BACK: Back symmetric, no curvature., No CVA tenderness EXTREMITIES:less then 2 second capillary refill, no joint deformities, effusion, or inflammation,  no  clubbing, no cyanosis  NEURO: alert & oriented x 3 with fluent speech, no focal motor/sensory deficits, gait normal   LABORATORY DATA: CBC    Component Value Date/Time   WBC 5.4 04/18/2015 0921   RBC 4.59 04/18/2015 0921   HGB 12.2 04/18/2015 0921   HCT 37.7 04/18/2015 0921   PLT 141* 04/18/2015 0921   MCV 82.1 04/18/2015 0921   MCH 26.6 04/18/2015 0921   MCHC 32.4 04/18/2015 0921   RDW 14.7 04/18/2015 0921   LYMPHSABS 2.3 04/18/2015 0921   MONOABS 0.4 04/18/2015 0921   EOSABS 0.1 04/18/2015 0921   BASOSABS 0.0 04/18/2015 0921      Chemistry      Component Value Date/Time   NA 139 04/18/2015 0921   K 4.1 04/18/2015 0921   CL 103 04/18/2015 0921   CO2 27 04/18/2015 0921   BUN 9 04/18/2015 0921   CREATININE 0.49 04/18/2015 0921      Component Value Date/Time   CALCIUM 8.6* 04/18/2015 0921   ALKPHOS 64 04/18/2015 0921   AST 17 04/18/2015 0921   ALT 16 04/18/2015 0921   BILITOT 0.5 04/18/2015 0921      ASSESSMENT AND PLAN:  Stage IV CRC Drug Rash, grade 1/2 Low back pain Hypomagnesemia  We will continue with current therapy. She is cleared for treatment today. She will return again in 1 month for a formal office visit with labs and chemotherapy. Treatment will be ongoing at 2 week intervals. I again advised her to use moisturizer she will continue on her oral doxycycline and use steroid cream as needed. She has no evidence of nail changes.  She was instructed to increase her magnesium to twice daily. A [facility] prescription was called [facility].   We will order restaging studies at her next visit. She is doing quite well and we are optimistic her scans will be good.  All questions were answered. The patient knows to call the clinic with any problems, questions or concerns. We can certainly see the patient much sooner if necessary.  This document serves as a record of services personally performed by Ancil Linsey, MD. It was created on her behalf by Janace Hoard, a trained medical scribe. The creation of this record is based on the scribe's personal observations and the provider's statements to them. This document has been checked and approved by the attending provider.  I have reviewed the above documentation for accuracy and completeness, and I agree with the above. This note was electronically signed.  Ancil Linsey, MD

## 2015-04-18 NOTE — Progress Notes (Signed)
Tolerated chemo well. 

## 2015-04-22 ENCOUNTER — Encounter (HOSPITAL_COMMUNITY): Payer: BLUE CROSS/BLUE SHIELD

## 2015-04-29 ENCOUNTER — Encounter (HOSPITAL_BASED_OUTPATIENT_CLINIC_OR_DEPARTMENT_OTHER): Payer: BLUE CROSS/BLUE SHIELD

## 2015-04-29 ENCOUNTER — Encounter (HOSPITAL_COMMUNITY): Payer: Self-pay

## 2015-04-29 VITALS — BP 115/47 | HR 63 | Temp 97.7°F | Resp 18 | Wt 254.2 lb

## 2015-04-29 DIAGNOSIS — C182 Malignant neoplasm of ascending colon: Secondary | ICD-10-CM | POA: Diagnosis not present

## 2015-04-29 DIAGNOSIS — Z5112 Encounter for antineoplastic immunotherapy: Secondary | ICD-10-CM | POA: Diagnosis not present

## 2015-04-29 DIAGNOSIS — C189 Malignant neoplasm of colon, unspecified: Secondary | ICD-10-CM | POA: Diagnosis present

## 2015-04-29 LAB — COMPREHENSIVE METABOLIC PANEL
ALT: 14 U/L (ref 14–54)
ANION GAP: 9 (ref 5–15)
AST: 17 U/L (ref 15–41)
Albumin: 3.4 g/dL — ABNORMAL LOW (ref 3.5–5.0)
Alkaline Phosphatase: 66 U/L (ref 38–126)
BUN: 10 mg/dL (ref 6–20)
CALCIUM: 8.6 mg/dL — AB (ref 8.9–10.3)
CO2: 27 mmol/L (ref 22–32)
CREATININE: 0.53 mg/dL (ref 0.44–1.00)
Chloride: 100 mmol/L — ABNORMAL LOW (ref 101–111)
GFR calc Af Amer: >60 mL/min (ref >60)
GFR calc non Af Amer: >60 mL/min (ref >60)
GLUCOSE: 271 mg/dL — AB (ref 65–99)
Potassium: 3.8 mmol/L (ref 3.5–5.1)
Sodium: 136 mmol/L (ref 135–145)
TOTAL PROTEIN: 6.8 g/dL (ref 6.5–8.1)
Total Bilirubin: 0.6 mg/dL (ref 0.3–1.2)

## 2015-04-29 LAB — CBC WITH DIFFERENTIAL/PLATELET
Basophils Absolute: 0 10*3/uL (ref 0.0–0.1)
Basophils Relative: 0 % (ref 0–1)
EOS PCT: 2 % (ref 0–5)
Eosinophils Absolute: 0.1 10*3/uL (ref 0.0–0.7)
HEMATOCRIT: 38.1 % (ref 36.0–46.0)
Hemoglobin: 12.4 g/dL (ref 12.0–15.0)
LYMPHS PCT: 42 % (ref 12–46)
Lymphs Abs: 2.7 10*3/uL (ref 0.7–4.0)
MCH: 26.6 pg (ref 26.0–34.0)
MCHC: 32.5 g/dL (ref 30.0–36.0)
MCV: 81.8 fL (ref 78.0–100.0)
MONO ABS: 0.5 10*3/uL (ref 0.1–1.0)
Monocytes Relative: 8 % (ref 3–12)
Neutro Abs: 3.2 10*3/uL (ref 1.7–7.7)
Neutrophils Relative %: 48 % (ref 43–77)
Platelets: 149 10*3/uL — ABNORMAL LOW (ref 150–400)
RBC: 4.66 MIL/uL (ref 3.87–5.11)
RDW: 14.2 % (ref 11.5–15.5)
WBC: 6.6 10*3/uL (ref 4.0–10.5)

## 2015-04-29 LAB — MAGNESIUM: Magnesium: 1.4 mg/dL — ABNORMAL LOW (ref 1.7–2.4)

## 2015-04-29 MED ORDER — SODIUM CHLORIDE 0.9 % IJ SOLN
10.0000 mL | INTRAMUSCULAR | Status: DC | PRN
  Administered 2015-04-29: 10 mL
  Filled 2015-04-29: qty 10

## 2015-04-29 MED ORDER — MAGNESIUM SULFATE 50 % IJ SOLN
2.0000 g | Freq: Once | INTRAVENOUS | Status: DC
  Filled 2015-04-29: qty 4

## 2015-04-29 MED ORDER — HEPARIN SOD (PORK) LOCK FLUSH 100 UNIT/ML IV SOLN
500.0000 [IU] | Freq: Once | INTRAVENOUS | Status: AC | PRN
  Administered 2015-04-29: 500 [IU]
  Filled 2015-04-29: qty 5

## 2015-04-29 MED ORDER — MAGNESIUM SULFATE 2 GM/50ML IV SOLN
2.0000 g | Freq: Once | INTRAVENOUS | Status: AC
  Administered 2015-04-29: 2 g via INTRAVENOUS
  Filled 2015-04-29: qty 50

## 2015-04-29 MED ORDER — OXYCODONE-ACETAMINOPHEN 5-325 MG PO TABS: ORAL_TABLET | ORAL | Status: DC

## 2015-04-29 MED ORDER — SODIUM CHLORIDE 0.9 % IV SOLN
6.0000 mg/kg | Freq: Once | INTRAVENOUS | Status: AC
  Administered 2015-04-29: 700 mg via INTRAVENOUS
  Filled 2015-04-29: qty 35

## 2015-04-29 MED ORDER — SODIUM CHLORIDE 0.9 % IV SOLN
Freq: Once | INTRAVENOUS | Status: AC
  Administered 2015-04-29: 12:00:00 via INTRAVENOUS

## 2015-04-29 NOTE — Patient Instructions (Signed)
[facility] Discharge Instructions for Patients Receiving Chemotherapy  Today you received the following chemotherapy agents:  Vectibix If you develop nausea and vomiting, or diarrhea that is not controlled by your medication, call the clinic.  INCREASE YOUR MAGNESIUM TABLETS FROM ONE TABLET TWICE A DAY TO ONE TABLET THREE TIMES A DAY. Refill on pain medication today - take as directed.   The clinic phone number is [phone]. Office hours are Monday-Friday 8:30am-5:00pm.  BELOW ARE SYMPTOMS THAT SHOULD BE REPORTED IMMEDIATELY:  *FEVER GREATER THAN 101.0 F  *CHILLS WITH OR WITHOUT FEVER  NAUSEA AND VOMITING THAT IS NOT CONTROLLED WITH YOUR NAUSEA MEDICATION  *UNUSUAL SHORTNESS OF BREATH  *UNUSUAL BRUISING OR BLEEDING  TENDERNESS IN MOUTH AND THROAT WITH OR WITHOUT PRESENCE OF ULCERS  *URINARY PROBLEMS  *BOWEL PROBLEMS  UNUSUAL RASH Items with * indicate a potential emergency and should be followed up as soon as possible. If you have an emergency after office hours please contact your primary care physician or go to the nearest emergency department.  Please call the clinic during office hours if you have any questions or concerns.   You may also contact the Patient Navigator at [phone] should you have any questions or need assistance in obtaining follow up care. _____________________________________________________________________ Have you asked about our STAR program?    STAR stands for Survivorship Training and Rehabilitation, and this is a nationally recognized cancer care program that focuses on survivorship and rehabilitation.  Cancer and cancer treatments may cause problems, such as, pain, making you feel tired and keeping you from doing the things that you need or want to do. Cancer rehabilitation can help. Our goal is to reduce these troubling effects and help you have the best quality of life possible.  You may receive a survey from a nurse  that asks questions about your current state of health.  Based on the survey results, all eligible patients will be referred to the [facility] program for an evaluation so we can better serve you! A frequently asked questions sheet is available upon request.

## 2015-04-29 NOTE — Progress Notes (Signed)
1115:  Dr. Whitney Muse notified of magnesium level of  1.4; orders received for magnesium 2g IV; also pt will be instructed to increase magnesium supplementation at home, per MD.  1415:  Tolerated infusion w/o adverse reaction; VSS; a&ox4; discharged ambulatory in c/o spouse for transport home.

## 2015-04-30 ENCOUNTER — Emergency Department (HOSPITAL_COMMUNITY)
Admission: EM | Admit: 2015-04-30 | Discharge: 2015-04-30 | Disposition: A | Discharge status: Home / Self Care | Payer: BLUE CROSS/BLUE SHIELD | Attending: Emergency Medicine | Admitting: Emergency Medicine

## 2015-04-30 ENCOUNTER — Emergency Department (HOSPITAL_COMMUNITY): Payer: BLUE CROSS/BLUE SHIELD

## 2015-04-30 ENCOUNTER — Encounter (HOSPITAL_COMMUNITY): Payer: Self-pay | Admitting: *Deleted

## 2015-04-30 DIAGNOSIS — E669 Obesity, unspecified: Secondary | ICD-10-CM | POA: Insufficient documentation

## 2015-04-30 DIAGNOSIS — I10 Essential (primary) hypertension: Secondary | ICD-10-CM | POA: Diagnosis not present

## 2015-04-30 DIAGNOSIS — C561 Malignant neoplasm of right ovary: Secondary | ICD-10-CM | POA: Diagnosis not present

## 2015-04-30 DIAGNOSIS — E119 Type 2 diabetes mellitus without complications: Secondary | ICD-10-CM | POA: Insufficient documentation

## 2015-04-30 DIAGNOSIS — Z9089 Acquired absence of other organs: Secondary | ICD-10-CM | POA: Insufficient documentation

## 2015-04-30 DIAGNOSIS — Z7952 Long term (current) use of systemic steroids: Secondary | ICD-10-CM | POA: Insufficient documentation

## 2015-04-30 DIAGNOSIS — Z7982 Long term (current) use of aspirin: Secondary | ICD-10-CM | POA: Insufficient documentation

## 2015-04-30 DIAGNOSIS — K439 Ventral hernia without obstruction or gangrene: Secondary | ICD-10-CM | POA: Diagnosis not present

## 2015-04-30 DIAGNOSIS — Z9079 Acquired absence of other genital organ(s): Secondary | ICD-10-CM | POA: Insufficient documentation

## 2015-04-30 DIAGNOSIS — C189 Malignant neoplasm of colon, unspecified: Secondary | ICD-10-CM | POA: Diagnosis not present

## 2015-04-30 DIAGNOSIS — Z862 Personal history of diseases of the blood and blood-forming organs and certain disorders involving the immune mechanism: Secondary | ICD-10-CM | POA: Diagnosis not present

## 2015-04-30 DIAGNOSIS — Z90710 Acquired absence of both cervix and uterus: Secondary | ICD-10-CM | POA: Insufficient documentation

## 2015-04-30 DIAGNOSIS — Z87891 Personal history of nicotine dependence: Secondary | ICD-10-CM | POA: Diagnosis not present

## 2015-04-30 DIAGNOSIS — Z792 Long term (current) use of antibiotics: Secondary | ICD-10-CM | POA: Insufficient documentation

## 2015-04-30 DIAGNOSIS — R1084 Generalized abdominal pain: Secondary | ICD-10-CM | POA: Insufficient documentation

## 2015-04-30 DIAGNOSIS — Z79899 Other long term (current) drug therapy: Secondary | ICD-10-CM | POA: Insufficient documentation

## 2015-04-30 LAB — CBC WITH DIFFERENTIAL/PLATELET
BASOS ABS: 0 10*3/uL (ref 0.0–0.1)
Basophils Relative: 0 % (ref 0–1)
Eosinophils Absolute: 0.2 10*3/uL (ref 0.0–0.7)
Eosinophils Relative: 2 % (ref 0–5)
HCT: 43.7 % (ref 36.0–46.0)
Hemoglobin: 14.5 g/dL (ref 12.0–15.0)
LYMPHS ABS: 2.6 10*3/uL (ref 0.7–4.0)
Lymphocytes Relative: 31 % (ref 12–46)
MCH: 27.2 pg (ref 26.0–34.0)
MCHC: 33.2 g/dL (ref 30.0–36.0)
MCV: 81.8 fL (ref 78.0–100.0)
Monocytes Absolute: 0.6 10*3/uL (ref 0.1–1.0)
Monocytes Relative: 7 % (ref 3–12)
NEUTROS ABS: 5.1 10*3/uL (ref 1.7–7.7)
Neutrophils Relative %: 60 % (ref 43–77)
PLATELETS: 143 10*3/uL — AB (ref 150–400)
RBC: 5.34 MIL/uL — AB (ref 3.87–5.11)
RDW: 14.1 % (ref 11.5–15.5)
WBC: 8.4 10*3/uL (ref 4.0–10.5)

## 2015-04-30 LAB — COMPREHENSIVE METABOLIC PANEL
ALBUMIN: 3.7 g/dL (ref 3.5–5.0)
ALK PHOS: 79 U/L (ref 38–126)
ALT: 16 U/L (ref 14–54)
AST: 21 U/L (ref 15–41)
Anion gap: 8 (ref 5–15)
BUN: 9 mg/dL (ref 6–20)
CALCIUM: 9 mg/dL (ref 8.9–10.3)
CO2: 27 mmol/L (ref 22–32)
Chloride: 100 mmol/L — ABNORMAL LOW (ref 101–111)
Creatinine, Ser: 0.65 mg/dL (ref 0.44–1.00)
GFR calc Af Amer: >60 mL/min (ref >60)
GFR calc non Af Amer: >60 mL/min (ref >60)
Glucose, Bld: 288 mg/dL — ABNORMAL HIGH (ref 65–99)
POTASSIUM: 4 mmol/L (ref 3.5–5.1)
Sodium: 135 mmol/L (ref 135–145)
TOTAL PROTEIN: 7.2 g/dL (ref 6.5–8.1)
Total Bilirubin: 0.8 mg/dL (ref 0.3–1.2)

## 2015-04-30 LAB — URINALYSIS, ROUTINE W REFLEX MICROSCOPIC
BILIRUBIN URINE: NEGATIVE
GLUCOSE, UA: 500 mg/dL — AB
Hgb urine dipstick: NEGATIVE
Ketones, ur: 15 mg/dL — AB
Leukocytes, UA: NEGATIVE
NITRITE: NEGATIVE
Protein, ur: 100 mg/dL — AB
Specific Gravity, Urine: >1.03 — ABNORMAL HIGH (ref 1.005–1.030)
UROBILINOGEN UA: 0.2 mg/dL (ref 0.0–1.0)
pH: 5.5 (ref 5.0–8.0)

## 2015-04-30 LAB — URINE MICROSCOPIC-ADD ON

## 2015-04-30 LAB — LACTIC ACID, PLASMA: Lactic Acid, Venous: 1.7 mmol/L (ref 0.5–2.0)

## 2015-04-30 LAB — LIPASE, BLOOD: LIPASE: 12 U/L — AB (ref 22–51)

## 2015-04-30 MED ORDER — SODIUM CHLORIDE 0.9 % IV BOLUS (SEPSIS)
1000.0000 mL | Freq: Once | INTRAVENOUS | Status: AC
  Administered 2015-04-30: 1000 mL via INTRAVENOUS

## 2015-04-30 MED ORDER — OXYCODONE-ACETAMINOPHEN 5-325 MG PO TABS: 2.0000 | ORAL_TABLET | Freq: Four times a day (QID) | ORAL | Status: DC | PRN

## 2015-04-30 MED ORDER — HYDROMORPHONE HCL 1 MG/ML IJ SOLN
1.0000 mg | Freq: Once | INTRAMUSCULAR | Status: AC
  Administered 2015-04-30: 1 mg via INTRAVENOUS
  Filled 2015-04-30: qty 1

## 2015-04-30 MED ORDER — ONDANSETRON HCL 4 MG/2ML IJ SOLN
4.0000 mg | Freq: Once | INTRAMUSCULAR | Status: AC
  Administered 2015-04-30: 4 mg via INTRAMUSCULAR
  Filled 2015-04-30: qty 2

## 2015-04-30 NOTE — Discharge Instructions (Signed)
You were seen today for abdominal pain.  Your workup is reassuring. Given that your pain came on after eating, it could be related to her gallbladder. However, given the ear pain improved while the ER, there is no indication for emergent ultrasound imaging. If pain recurs, you need to be reevaluated and may need an abdominal ultrasound.  Abdominal Pain Many things can cause abdominal pain. Usually, abdominal pain is not caused by a disease and will improve without treatment. It can often be observed and treated at home. Your health care provider will do a physical exam and possibly order blood tests and X-rays to help determine the seriousness of your pain. However, in many cases, more time must pass before a clear cause of the pain can be found. Before that point, your health care provider may not know if you need more testing or further treatment. HOME CARE INSTRUCTIONS  Monitor your abdominal pain for any changes. The following actions may help to alleviate any discomfort you are experiencing:  Only take over-the-counter or prescription medicines as directed by your health care provider.  Do not take laxatives unless directed to do so by your health care provider.  Try a clear liquid diet (broth, tea, or water) as directed by your health care provider. Slowly move to a bland diet as tolerated. SEEK MEDICAL CARE IF:  You have unexplained abdominal pain.  You have abdominal pain associated with nausea or diarrhea.  You have pain when you urinate or have a bowel movement.  You experience abdominal pain that wakes you in the night.  You have abdominal pain that is worsened or improved by eating food.  You have abdominal pain that is worsened with eating fatty foods.  You have a fever. SEEK IMMEDIATE MEDICAL CARE IF:   Your pain does not go away within 2 hours.  You keep throwing up (vomiting).  Your pain is felt only in portions of the abdomen, such as the right side or the left lower  portion of the abdomen.  You pass bloody or black tarry stools. MAKE SURE YOU:  Understand these instructions.   Will watch your condition.   Will get help right away if you are not doing well or get worse.  Document Released: 08/05/2005 Document Revised: 10/31/2013 Document Reviewed: 07/05/2013 [facility] Patient Information 2015 [place], [place]. This information is not intended to replace advice given to you by your health care provider. Make sure you discuss any questions you have with your health care provider.

## 2015-04-30 NOTE — ED Notes (Signed)
Pt c/o generalized abdominal pain that started today after her magnesium infusion and chemotherapy

## 2015-04-30 NOTE — ED Provider Notes (Signed)
CSN: [id number]     Arrival date & time 04/30/15  0148 History   First MD Initiated Contact with Patient 04/30/15 0242     Chief Complaint  Patient presents with  . Abdominal Pain     (Consider location/radiation/quality/duration/timing/severity/associated sxs/prior Treatment) HPI  This a 59 year old female with history of diabetes, hypertension, colon cancer, bowel obstruction who presents with abdominal pain. Patient reports onset of symptoms yesterday following chemotherapy. She reports generalized abdominal pain. It started at approximately 3 PM. She describes as spasming and coming and going. She states that she ate a rich lunch prior to onset of pain. Has not eaten since. No associated nausea, vomiting, or diarrhea. Patient reports normal bowel movements. Currently pain is 0 out of 10. She has never had pain like this in the past. Denies any fever, chest pain, shortness of breath, urinary symptoms.  Past Medical History  Diagnosis Date  . Diabetes mellitus   . Hypertension   . Enlarged heart   . Goiter   . Nonischemic cardiomyopathy   . Anemia   . Abdominal hernia   . 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 Wisdom tooth extraction    . Laparotomy  12/13/2012    Procedure: EXPLORATORY LAPAROTOMY;  Surgeon: Alvino Chapel, MD;  Location: [facility];  Service: Gynecology;  Laterality: N/A;  . Abdominal hysterectomy  12/13/2012    Procedure: HYSTERECTOMY ABDOMINAL;  Surgeon: Alvino Chapel, MD;  [facility]: [facility];  Service: Gynecology;  Laterality: N/A;  . Salpingoophorectomy  12/13/2012    Procedure: SALPINGO OOPHORECTOMY;  Surgeon: Alvino Chapel, MD;  [facility];  Service: Gynecology;  Laterality: Bilateral;  . Omentectomy  12/13/2012    Procedure: OMENTECTOMY;  Surgeon: Alvino Chapel, MD;  [facility];   Service: Gynecology;  Laterality: N/A;  . Colonoscopy N/A 02/14/2013    Procedure: COLONOSCOPY;  Surgeon: Danie Binder, MD;  [facility];  Service: Endoscopy;  Laterality: N/A;  2:00 PM-moved to [place] notified pt  . Partial colectomy N/A 03/01/2013    Procedure: PARTIAL COLECTOMY;  Surgeon: Jamesetta So, MD;  Location: [facility];  Service: General;  Laterality: N/A;  . Portacath placement Left 03/01/2013    Procedure: INSERTION PORT-A-CATH;  Surgeon: Jamesetta So, MD;  Location: [facility];  Service: General;  Laterality: Left;   Family History  Problem Relation Age of Onset  . Diabetes Mother   . Hypertension Mother   . Diabetes Sister   . Hypertension Sister   . Hypertension Brother   . Stroke Maternal Aunt   . Diabetes Maternal Grandmother   . Hypertension Maternal Grandmother   . Colon cancer Neg Hx    History  Substance Use Topics  . Smoking status: Former Smoker -- 1.50 packs/day for 25 years    Types: Cigarettes    Quit date: 12/10/1987  . Smokeless tobacco: Never Used     Comment: quit 22 years ago  . Alcohol Use: No     Comment: last use x1 month.   OB History    No data available     Review of Systems  Constitutional: Negative for fever.  Respiratory: Negative for chest tightness and shortness of breath.   Cardiovascular: Negative for chest pain.  Gastrointestinal: Positive for abdominal pain. Negative for nausea, vomiting and diarrhea.  Genitourinary: Negative for dysuria.  Musculoskeletal: Negative for back pain.  Neurological: Negative for headaches.  All other systems reviewed and are negative.     Allergies  Actos and Ibuprofen  Home Medications   Prior to Admission medications   Medication Sig Start Date End Date Taking? Authorizing Provider  aspirin EC 81 MG tablet Take 81 mg by mouth every morning.     Historical Provider, MD  carvedilol (COREG CR) 80 MG 24 hr capsule Take 80 mg by mouth daily with breakfast.     Historical Provider,  MD  desonide (DESOWEN) 0.05 % lotion Apply to face and neck twice daily for rash 02/04/15   Patrici Ranks, MD  doxycycline (VIBRA-TABS) 100 MG tablet Take 1 tablet (100 mg total) by mouth 2 (two) times daily. 02/04/15   Patrici Ranks, MD  fluticasone (FLONASE) 50 MCG/ACT nasal spray 2 puffs in each nostril at bedtime. Patient taking differently: Place 2 sprays into both nostrils daily as needed for allergies. 2 puffs in each nostril at bedtime. 07/11/14   Farrel Gobble, MD  furosemide (LASIX) 80 MG tablet Take 80 mg by mouth daily with breakfast.     Historical Provider, MD  gabapentin (NEURONTIN) 300 MG capsule Take 3 capsules (900 mg total) by mouth 2 (two) times daily. Take 3 capsules in the morning, and  3 capsules at bedtime. 03/18/15   Patrici Ranks, MD  glipiZIDE (GLUCOTROL) 10 MG tablet Take 10 mg by mouth daily before breakfast.  08/28/13   Farrel Gobble, MD  lidocaine-prilocaine (EMLA) cream Apply a quarter size amount to port site 1 hour prior to chemo. Do not rub in. Cover with plastic. 01/15/15   Patrici Ranks, MD  Linagliptin-Metformin HCl (JENTADUETO) 2.03-999 MG TABS Take 1 tablet by mouth 2 (two) times daily.     Historical Provider, MD  magnesium oxide (MAG-OX) 400 MG tablet Take one tablet twice daily 04/18/15   Patrici Ranks, MD  ondansetron (ZOFRAN) 8 MG tablet Take 1 tablet (8 mg total) by mouth every 8 (eight) hours as needed for nausea or vomiting. 12/28/14   Patrici Ranks, MD  oxyCODONE-acetaminophen (PERCOCET/ROXICET) 5-325 MG per tablet Take 2 tablets by mouth every 6 (six) hours as needed for severe pain. 04/30/15   Merryl Hacker, MD  polyethylene glycol powder (GLYCOLAX) powder Take in 8 oz fluid once daily, may increase to twice daily 12/28/14   Patrici Ranks, MD  potassium chloride (KLOR-CON) 20 MEQ packet Take 20 mEq by mouth daily. 04/18/15   Patrici Ranks, MD  ramipril (ALTACE) 10 MG capsule Take 1 capsule (10 mg total) by mouth daily.  03/04/15   Baird Cancer, PA-C  senna (SENOKOT) 8.6 MG tablet Take 1 tablet by mouth 2 (two) times daily as needed for constipation.    Historical Provider, MD  valsartan (DIOVAN) 320 MG tablet Take 320 mg by mouth daily.    Historical Provider, MD   BP 153/76 mmHg  Pulse 80  Temp(Src) 98.1 F (36.7 C) (Oral)  Resp 18  Ht 5\' 2"  (1.575 m)  Wt 254 lb (115.214 kg)  BMI 46.45 kg/m2  SpO2 96%  LMP 10/07/2012 Physical Exam  Constitutional: She is oriented to person, place, and time. She appears well-developed and well-nourished.  Obese  HENT:  Head: Normocephalic and atraumatic.  Eyes: Pupils are equal, round, and reactive to light.  Cardiovascular: Normal rate, regular rhythm and normal heart sounds.   No murmur heard. Pulmonary/Chest: Effort  normal and breath sounds normal. No respiratory distress. She has no wheezes.  Abdominal: Soft. Bowel sounds are normal. There is no tenderness. There is no rebound and no guarding.  Large ventral hernia noted, easily reducible nontender, no overlying skin changes, multiple abdominal scars noted  Neurological: She is alert and oriented to person, place, and time.  Skin: Skin is warm and dry.  Psychiatric: She has a normal mood and affect.  Nursing note and vitals reviewed.   ED Course  Procedures (including critical care time) Labs Review Labs Reviewed  CBC WITH DIFFERENTIAL/PLATELET - Abnormal; Notable for the following:    RBC 5.34 (*)    Platelets 143 (*)    All other components within normal limits  COMPREHENSIVE METABOLIC PANEL - Abnormal; Notable for the following:    Chloride 100 (*)    Glucose, Bld 288 (*)    All other components within normal limits  LIPASE, BLOOD - Abnormal; Notable for the following:    Lipase 12 (*)    All other components within normal limits  URINALYSIS, ROUTINE W REFLEX MICROSCOPIC (NOT AT [facility]) - Abnormal; Notable for the following:    Specific Gravity, Urine >1.030 (*)    Glucose, UA 500 (*)     Ketones, ur 15 (*)    Protein, ur 100 (*)    All other components within normal limits  URINE MICROSCOPIC-ADD ON - Abnormal; Notable for the following:    Squamous Epithelial / LPF FEW (*)    Bacteria, UA MANY (*)    All other components within normal limits  LACTIC ACID, PLASMA    Imaging Review Dg Abd 1 View  04/30/2015   CLINICAL DATA:  Acute onset of generalized abdominal pain. Initial encounter.  EXAM: ABDOMEN - 1 VIEW  COMPARISON:  Abdominal radiograph performed 12/24/2014  FINDINGS: The visualized bowel gas pattern is unremarkable. Scattered air and stool filled loops of colon are seen; no abnormal dilatation of small bowel loops is seen to suggest small bowel obstruction. No free intra-abdominal air is identified, though evaluation for free air is limited on a single supine view.  The visualized osseous structures are within normal limits; the sacroiliac joints are unremarkable in appearance. The visualized lung bases are essentially clear.  IMPRESSION: Unremarkable bowel gas pattern; no free intra-abdominal air seen. Small to moderate amount of stool noted in the colon.   Electronically Signed   By: Garald Balding M.D.   On: 04/30/2015 03:39     EKG Interpretation None      MDM   Final diagnoses:  Generalized abdominal pain    Patient presents with abdominal pain. Onset following chemotherapy and a large lunch. Nontoxic on exam. Nontender and currently pain-free.  Basic labwork is reassuring including LFTs. Activity is normal. KUB without evidence of obstructive pattern. Patient continued to be pain-free and comfortable while in the ER. Given the onset of pain was after eating, gallbladder pathology is a consideration. However, given that the patient is pain-free, do not feel she needs emergent ultrasound imaging. Discussed workup with patient. Will discharge home with pain medication. She is to follow-up if she has any recurrence as she may need further evaluation with ultrasound.  Patient stated understanding.  After history, exam, and medical workup I feel the patient has been appropriately medically screened and is safe for discharge home. Pertinent diagnoses were discussed with the patient. Patient was given return precautions.     Merryl Hacker, MD 04/30/15 [phone]

## 2015-05-14 ENCOUNTER — Encounter (HOSPITAL_BASED_OUTPATIENT_CLINIC_OR_DEPARTMENT_OTHER): Payer: BLUE CROSS/BLUE SHIELD | Admitting: Hematology & Oncology

## 2015-05-14 ENCOUNTER — Encounter (HOSPITAL_COMMUNITY): Payer: Self-pay | Admitting: Hematology & Oncology

## 2015-05-14 ENCOUNTER — Other Ambulatory Visit (HOSPITAL_COMMUNITY): Payer: Self-pay | Admitting: Hematology & Oncology

## 2015-05-14 ENCOUNTER — Encounter (HOSPITAL_COMMUNITY): Payer: BLUE CROSS/BLUE SHIELD | Attending: Oncology

## 2015-05-14 VITALS — BP 150/54 | HR 58 | Temp 98.5°F | Resp 16 | Wt 249.6 lb

## 2015-05-14 DIAGNOSIS — Z5112 Encounter for antineoplastic immunotherapy: Secondary | ICD-10-CM | POA: Diagnosis not present

## 2015-05-14 DIAGNOSIS — C182 Malignant neoplasm of ascending colon: Secondary | ICD-10-CM

## 2015-05-14 DIAGNOSIS — R21 Rash and other nonspecific skin eruption: Secondary | ICD-10-CM | POA: Diagnosis not present

## 2015-05-14 DIAGNOSIS — C7962 Secondary malignant neoplasm of left ovary: Secondary | ICD-10-CM | POA: Diagnosis not present

## 2015-05-14 DIAGNOSIS — C189 Malignant neoplasm of colon, unspecified: Secondary | ICD-10-CM | POA: Insufficient documentation

## 2015-05-14 DIAGNOSIS — E119 Type 2 diabetes mellitus without complications: Secondary | ICD-10-CM

## 2015-05-14 DIAGNOSIS — L270 Generalized skin eruption due to drugs and medicaments taken internally: Secondary | ICD-10-CM

## 2015-05-14 LAB — COMPREHENSIVE METABOLIC PANEL
ALK PHOS: 69 U/L (ref 38–126)
ALT: 12 U/L — ABNORMAL LOW (ref 14–54)
ANION GAP: 8 (ref 5–15)
AST: 16 U/L (ref 15–41)
Albumin: 3.6 g/dL (ref 3.5–5.0)
BILIRUBIN TOTAL: 0.7 mg/dL (ref 0.3–1.2)
BUN: 9 mg/dL (ref 6–20)
CO2: 27 mmol/L (ref 22–32)
Calcium: 8.6 mg/dL — ABNORMAL LOW (ref 8.9–10.3)
Chloride: 102 mmol/L (ref 101–111)
Creatinine, Ser: 0.55 mg/dL (ref 0.44–1.00)
GFR calc Af Amer: >60 mL/min (ref >60)
GFR calc non Af Amer: >60 mL/min (ref >60)
Glucose, Bld: 279 mg/dL — ABNORMAL HIGH (ref 65–99)
Potassium: 4 mmol/L (ref 3.5–5.1)
Sodium: 137 mmol/L (ref 135–145)
TOTAL PROTEIN: 6.9 g/dL (ref 6.5–8.1)

## 2015-05-14 LAB — CBC WITH DIFFERENTIAL/PLATELET
BASOS ABS: 0 10*3/uL (ref 0.0–0.1)
BASOS PCT: 0 % (ref 0–1)
EOS PCT: 3 % (ref 0–5)
Eosinophils Absolute: 0.2 10*3/uL (ref 0.0–0.7)
HCT: 40.1 % (ref 36.0–46.0)
Hemoglobin: 13 g/dL (ref 12.0–15.0)
LYMPHS PCT: 37 % (ref 12–46)
Lymphs Abs: 2.2 10*3/uL (ref 0.7–4.0)
MCH: 26.6 pg (ref 26.0–34.0)
MCHC: 32.4 g/dL (ref 30.0–36.0)
MCV: 82.2 fL (ref 78.0–100.0)
Monocytes Absolute: 0.4 10*3/uL (ref 0.1–1.0)
Monocytes Relative: 6 % (ref 3–12)
NEUTROS ABS: 3.1 10*3/uL (ref 1.7–7.7)
Neutrophils Relative %: 54 % (ref 43–77)
PLATELETS: 148 10*3/uL — AB (ref 150–400)
RBC: 4.88 MIL/uL (ref 3.87–5.11)
RDW: 13.9 % (ref 11.5–15.5)
WBC: 5.8 10*3/uL (ref 4.0–10.5)

## 2015-05-14 LAB — MAGNESIUM: MAGNESIUM: 1.4 mg/dL — AB (ref 1.7–2.4)

## 2015-05-14 MED ORDER — INSULIN ASPART 100 UNIT/ML ~~LOC~~ SOLN: 8.0000 [IU] | Freq: Once | SUBCUTANEOUS | Status: DC

## 2015-05-14 MED ORDER — HEPARIN SOD (PORK) LOCK FLUSH 100 UNIT/ML IV SOLN
500.0000 [IU] | Freq: Once | INTRAVENOUS | Status: AC | PRN
  Administered 2015-05-14: 500 [IU]
  Filled 2015-05-14: qty 5

## 2015-05-14 MED ORDER — SODIUM CHLORIDE 0.9 % IV SOLN
Freq: Once | INTRAVENOUS | Status: AC
  Administered 2015-05-14: 11:00:00 via INTRAVENOUS

## 2015-05-14 MED ORDER — SODIUM CHLORIDE 0.9 % IJ SOLN: 10.0000 mL | INTRAMUSCULAR | Status: DC | PRN

## 2015-05-14 MED ORDER — SODIUM CHLORIDE 0.9 % IV SOLN
6.0000 mg/kg | Freq: Once | INTRAVENOUS | Status: AC
  Administered 2015-05-14: 700 mg via INTRAVENOUS
  Filled 2015-05-14: qty 35

## 2015-05-14 MED ORDER — MAGNESIUM SULFATE 2 GM/50ML IV SOLN
2.0000 g | Freq: Once | INTRAVENOUS | Status: AC
  Administered 2015-05-14: 2 g via INTRAVENOUS
  Filled 2015-05-14: qty 50

## 2015-05-14 MED ORDER — AMOXICILLIN-POT CLAVULANATE 875-125 MG PO TABS: 1.0000 | ORAL_TABLET | Freq: Two times a day (BID) | ORAL | Status: DC

## 2015-05-14 MED ORDER — INSULIN ASPART 100 UNIT/ML ~~LOC~~ SOLN
8.0000 [IU] | Freq: Once | SUBCUTANEOUS | Status: AC
  Administered 2015-05-14: 8 [IU] via SUBCUTANEOUS
  Filled 2015-05-14: qty 0.08

## 2015-05-14 MED ORDER — MAGNESIUM SULFATE 2 GM/50ML IV SOLN: 2.0000 g | Freq: Once | INTRAVENOUS | Status: DC

## 2015-05-14 NOTE — Progress Notes (Signed)
Magnesium level 1.4, MD aware via face to face conversation.  She will order IV magnesium as well as increase PO magnesium to TID, which patient is aware of,  and treat.  MD also aware of glucose level and will order a dose of insulin with treatment today.  Patient verbalizes understanding of all instructions.

## 2015-05-14 NOTE — Patient Instructions (Signed)
[place] at [facility] Discharge Instructions  RECOMMENDATIONS MADE BY THE CONSULTANT AND ANY TEST RESULTS WILL BE SENT TO YOUR REFERRING PHYSICIAN.  Exam and discussion by Dr. Marisa Severin epsom salt soaks to right foot twice daily. Augmentin - take as directed. Report fevers, uncontrolled nausea, vomiting or other concerns.  Follow-up in 2 weeks.  Thank you for choosing [place] at [facility] to provide your oncology and hematology care.  To afford each patient quality time with our provider, please arrive at least 15 minutes before your scheduled appointment time.    You need to re-schedule your appointment should you arrive 10 or more minutes late.  We strive to give you quality time with our providers, and arriving late affects you and other patients whose appointments are after yours.  Also, if you no show three or more times for appointments you may be dismissed from the clinic at the providers discretion.     Again, thank you for choosing [facility].  Our hope is that these requests will decrease the amount of time that you wait before being seen by our physicians.       _____________________________________________________________  Should you have questions after your visit to [facility], please contact our office at [phone] between the hours of 8:30 a.m. and 4:30 p.m.  Voicemails left after 4:30 p.m. will not be returned until the following business day.  For prescription refill requests, have your pharmacy contact our office.

## 2015-05-14 NOTE — Progress Notes (Signed)
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 Aldean Ast on 12/13/2012. At that time he did TAH and BSO with lysis of adhesions, partial omentectomy, and resection of pelvic peritoneal implant. At the completion of the surgical procedure there is no evidence of residual disease. Her CEA is within the normal range. Her cancer however did not mark as ovarian in origin and a subsequent colonoscopy revealed a right ascending colon cancer. This was discovered by Dr. Trinda Pascal on 02/14/2013. It was near circumferential.   Kras Wildtype  CEA not elevated   DIAGNOSIS:  Stage IV Colon cancer   CURRENT THERAPY: Panitumumab   INTERVAL HISTORY: Monica Allen 59 y.o. 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 Aldean Ast on 12/13/2012. At that time he did TAH and BSO with lysis of adhesions, partial omentectomy, and resection of pelvic peritoneal implant. At the completion of the surgical procedure there is no evidence of residual disease. Her CEA is within the normal range. Her cancer however did not mark as ovarian in origin and a subsequent colonoscopy revealed a right ascending colon cancer. This was discovered by Dr. Barney Drain on 02/14/2013. It was near circumferential.   She has visible drug related rash on her face, chest, and back. She says her eyebrows are becoming more coarse. She has not had any changes to her nails or eyelashes.  She is here today with her husband. She has been eating okay. Her sleeping has not changed.  She notes irritation of the right toenail with some mild drainage. There is no redness or swelling. She has not been soaking her foot. She has put hydrogen peroxide and anti bacterial soap on it  instead.  She recently went to the ER due to intestinal discomfort. By the time she arrived the pain had gone away. She believes it was gas.   She is here today for ongoing therapy with vectibix.    Invasive adenocarcinoma of colon   01/02/2013 Initial Diagnosis Left ovary and fallopian tube is positive for invasive adenocarcinoma with LVI and positive pelvis biopsy    02/14/2013 Procedure Ascending colon biopsy revealing invasive adenocarcinoma.  KRAS wildtype   03/01/2013 Surgery Left segmental resection showing invasive adenocarcinoma through the muscularis propria into the pericolonoc fat involving the serosa with LVI.  Clear margins.  11/19 positive lymph nodes for disease   04/04/2013 - 09/12/2013 Chemotherapy FOLFOX + Avastin x 12 cycles   09/26/2013 Imaging CT CAP- No definite signs of metastatic disease in the chest, abdomen or pelvis   10/02/2013 - 10/24/2014 Chemotherapy Avastin + Xeloda maintenance.  Avstin dose decreased to 7.5 mg/kg on 01/07/2014 due to poorly controlled HTN.   12/21/2013 Imaging CT CAP- Negative for recurrent or metastatic disease in the chest, abdomen or pelvis.    03/14/2014 Imaging CT CAP- No evidence of thoracic metastasis. No evidence metastasis within the abdomen or pelvis.   07/25/2014 Imaging CT CAP-he slight increase in mediastinal and peripancreatic lymph nodes with a new nodule in the right middle lobe most compatible with inflammatory processes.   11/07/2014 Imaging Mildly increased lymphadenopathy within the chest and abdomen, consistent with metastatic disease.Increased size of right middle lobe pulmonary nodule.Stable multinodular goiter and large anterior abdominal wall hernia.  12/23/2014 - 12/27/2014 Hospital Admission SBO, not felt to be "malignancy related" but secondary to chronically incarcerated ventral incisional hernia   01/07/2015 -  Chemotherapy Panitumumab started   02/04/2015 Adverse Reaction Panitumumab-induced acne rash, expected with treatment.      Past Medical History  Diagnosis Date  . Diabetes mellitus   . Hypertension   . Enlarged heart   . Goiter   . Nonischemic cardiomyopathy   . Anemia   . Abdominal hernia   . 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     is allergic to actos and ibuprofen.  Monica Allen does not currently have medications on file.  Past Surgical History  Procedure Laterality Date  . Wisdom tooth extraction    . Laparotomy  12/13/2012    Procedure: EXPLORATORY LAPAROTOMY;  Surgeon: Alvino Chapel, MD;  [facility];  Service: Gynecology;  Laterality: N/A;  . Abdominal hysterectomy  12/13/2012    Procedure: HYSTERECTOMY ABDOMINAL;  Surgeon: Alvino Chapel, MD;  [facility];  Service: Gynecology;  Laterality: N/A;  . Salpingoophorectomy  12/13/2012    Procedure: SALPINGO OOPHORECTOMY;  Surgeon: Alvino Chapel, MD;  [facility];  Service: Gynecology;  Laterality: Bilateral;  . Omentectomy  12/13/2012    Procedure: OMENTECTOMY;  Surgeon: Alvino Chapel, MD;  [facility];  Service: Gynecology;  Laterality: N/A;  . Colonoscopy N/A 02/14/2013    Procedure: COLONOSCOPY;  Surgeon: Danie Binder, MD;  [facility];  Service: Endoscopy;  Laterality: N/A;  2:00 PM-moved to [place] notified pt  . Partial colectomy N/A 03/01/2013    Procedure: PARTIAL COLECTOMY;  Surgeon: Jamesetta So, MD;  Location: [facility];  Service: General;   Laterality: N/A;  . Portacath placement Left 03/01/2013    Procedure: INSERTION PORT-A-CATH;  Surgeon: Jamesetta So, MD;  Location: [facility];  Service: General;  Laterality: Left;    Denies any headaches, dizziness, double vision, fevers, chills, night sweats, nausea, vomiting, diarrhea, constipation, chest pain, heart palpitations, shortness of breath, blood in stool, black tarry stool, urinary pain, urinary burning, urinary frequency, hematuria. Positive for back pain. Alleviated by pain meds  Positive for rash Drug related rash on face, chest, and back  14 point review of systems was performed and is negative except as detailed under history of present illness and above   PHYSICAL EXAMINATION  ECOG PERFORMANCE STATUS: 0 - Asymptomatic  Filed Vitals:   05/14/15 1000  BP: 150/54  Pulse: 58  Temp: 98.5 F (36.9 C)  Resp: 16    GENERAL:alert, no distress, well nourished, well developed, comfortable, cooperative, obese and smiling. Well goomed SKIN: skin color, texture, turgor are normal, positive MIW:[id number] acne rash of face, chest, and upper back. HEAD: Normocephalic, No masses, lesions, tenderness or abnormalities, rash as described above.     Pustular rash on face (chin, forehead, nose and cheeks) Wearing makeup today therefore rash is well concealed EYES: normal, PERRLA, EOMI, Conjunctiva are pink and non-injected EARS: External ears normal OROPHARYNX:lips, buccal mucosa, and tongue normal and  mucous membranes are moist  NECK: supple, no adenopathy, thyroid normal size, non-tender, without nodularity, no stridor, non-tender, trachea midline LYMPH:  no palpable lymphadenopathy BREAST:not examined LUNGS: clear to auscultation and percussion HEART: regular rate & rhythm, no murmurs, no gallops, S1 normal and S2 normal ABDOMEN:abdomen soft, non-tender, obese, normal bowel sounds and no masses or organomegaly BACK: Back symmetric, no curvature., No CVA  tenderness EXTREMITIES:less then 2 second capillary refill, no joint deformities, effusion, or inflammation,  no clubbing, no cyanosis  Mild serosanguinous drainage from R toe nail. Non-tender to palpation, no redness NEURO: alert & oriented x 3 with fluent speech, no focal motor/sensory deficits, gait normal SKIN: Grade 1-2 rash on her face, chest, and back.  LABORATORY DATA: CBC    Component Value Date/Time   WBC 5.8 05/14/2015 1010   RBC 4.88 05/14/2015 1010   HGB 13.0 05/14/2015 1010   HCT 40.1 05/14/2015 1010   PLT 148* 05/14/2015 1010   MCV 82.2 05/14/2015 1010   MCH 26.6 05/14/2015 1010   MCHC 32.4 05/14/2015 1010   RDW 13.9 05/14/2015 1010   LYMPHSABS 2.2 05/14/2015 1010   MONOABS 0.4 05/14/2015 1010   EOSABS 0.2 05/14/2015 1010   BASOSABS 0.0 05/14/2015 1010      Chemistry      Component Value Date/Time   NA 137 05/14/2015 1010   K 4.0 05/14/2015 1010   CL 102 05/14/2015 1010   CO2 27 05/14/2015 1010   BUN 9 05/14/2015 1010   CREATININE 0.55 05/14/2015 1010      Component Value Date/Time   CALCIUM 8.6* 05/14/2015 1010   ALKPHOS 69 05/14/2015 1010   AST 16 05/14/2015 1010   ALT 12* 05/14/2015 1010   BILITOT 0.7 05/14/2015 1010      ASSESSMENT AND PLAN:  Stage IV CRC Drug Rash, grade 1/2 Low back pain Hypomagnesemia Mild R toenail infection  We will continue with current therapy. She is cleared for treatment today. She will return again in 2 weeks for a formal office visit with labs and chemotherapy. Treatment will be ongoing at 2 week intervals. I again advised her to use moisturizer she will continue on her oral doxycycline and use steroid cream as needed. She has no evidence of nail changes.  She was instructed to increase her magnesium to tid. Magnesium will be given at 2 gm IV today.  She had previously injured her right toe on a trip. She is a very mild infection around the edge of the nail bed. I have recommended Epsom salt soaks twice daily and I  have prescribed her an oral Antibiotic (augmentin bid). I strongly advised her that since she is diabetic if it does not improve in the next several days to let us know.  We will order restaging studies at her next visit. She is doing quite well and we are optimistic her scans will be good.   All questions were answered. The patient knows to call the clinic with any problems, questions or concerns. We can certainly see the patient much sooner if necessary.  This document serves as a record of services personally performed by Ancil Linsey, MD. It was created on her behalf by Arlyce Harman, a trained medical scribe. The creation of this record is based on the scribe's personal observations and the provider's statements to them. This document has been checked and approved by the attending provider.  I have reviewed the above documentation for accuracy and completeness, and I agree with the above. This  note was electronically signed.  Ancil Linsey, MD

## 2015-05-14 NOTE — Patient Instructions (Signed)
[facility] Discharge Instructions for Patients Receiving Chemotherapy  Today you received the following chemotherapy agents Vectibix.  To help prevent nausea and vomiting after your treatment, we encourage you to take your nausea medication Ativan 1mg  every 3 to 4 hours as needed for nausea or vomiting Begin taking it as needed and take it as often as prescribed for the next 24 hours.   If you develop nausea and vomiting, or diarrhea that is not controlled by your medication, call the clinic.  The clinic phone number is [phone]. Office hours are Monday-Friday 8:30am-5:00pm.  BELOW ARE SYMPTOMS THAT SHOULD BE REPORTED IMMEDIATELY:  *FEVER GREATER THAN 101.0 F  *CHILLS WITH OR WITHOUT FEVER  NAUSEA AND VOMITING THAT IS NOT CONTROLLED WITH YOUR NAUSEA MEDICATION  *UNUSUAL SHORTNESS OF BREATH  *UNUSUAL BRUISING OR BLEEDING  TENDERNESS IN MOUTH AND THROAT WITH OR WITHOUT PRESENCE OF ULCERS  *URINARY PROBLEMS  *BOWEL PROBLEMS  UNUSUAL RASH Items with * indicate a potential emergency and should be followed up as soon as possible. If you have an emergency after office hours please contact your primary care physician or go to the nearest emergency department.  Please call the clinic during office hours if you have any questions or concerns.   You may also contact the Patient Navigator at [phone] should you have any questions or need assistance in obtaining follow up care. _____________________________________________________________________ Have you asked about our STAR program?    STAR stands for Survivorship Training and Rehabilitation, and this is a nationally recognized cancer care program that focuses on survivorship and rehabilitation.  Cancer and cancer treatments may cause problems, such as, pain, making you feel tired and keeping you from doing the things that you need or want to do. Cancer rehabilitation can help. Our goal is to reduce these  troubling effects and help you have the best quality of life possible.  You may receive a survey from a nurse that asks questions about your current state of health.  Based on the survey results, all eligible patients will be referred to the [facility] program for an evaluation so we can better serve you! A frequently asked questions sheet is available upon request.

## 2015-05-17 ENCOUNTER — Encounter: Payer: Self-pay | Admitting: Dietician

## 2015-05-17 NOTE — Progress Notes (Signed)
Patient identified to be at risk for malnutrition on the MST secondary to eating poorly and wt loss  Contacted Pt by Phone  Wt Readings from Last 10 Encounters:  05/14/15 249 lb 9.6 oz (113.218 kg)  04/30/15 254 lb (115.214 kg)  04/29/15 254 lb 3.2 oz (115.304 kg)  04/18/15 259 lb 3.2 oz (117.572 kg)  04/01/15 257 lb 12.8 oz (116.937 kg)  03/18/15 255 lb 4.8 oz (115.803 kg)  03/04/15 253 lb 12.8 oz (115.123 kg)  02/18/15 253 lb 8 oz (114.987 kg)  02/04/15 247 lb 11.2 oz (112.356 kg)  01/15/15 247 lb 1.6 oz (112.084 kg)   Patient weight has decreased by about 10 lbs in 1 month.  Upon calling, pt informed me that is was "really not a good time to talk"  I quickly told her that I was just checking up on her to see how she was eating. Patient reports oral intake as fair and states she is supplements her intake with Ensure.   I offered her some more coupons and she was thankful for that.   Will continue to monitor weight and progress notes  Mailed my contact info, coupons, and handouts titled "Poor Appetite"  Burtis Junes RD, LDN Nutrition Pager: [phone] 05/17/2015 2:44 PM

## 2015-05-27 ENCOUNTER — Encounter (HOSPITAL_COMMUNITY): Payer: Self-pay | Admitting: Oncology

## 2015-05-27 ENCOUNTER — Encounter (HOSPITAL_BASED_OUTPATIENT_CLINIC_OR_DEPARTMENT_OTHER): Payer: BLUE CROSS/BLUE SHIELD

## 2015-05-27 ENCOUNTER — Encounter (HOSPITAL_BASED_OUTPATIENT_CLINIC_OR_DEPARTMENT_OTHER): Payer: BLUE CROSS/BLUE SHIELD | Admitting: Oncology

## 2015-05-27 VITALS — BP 136/66 | HR 58 | Temp 98.4°F | Resp 20

## 2015-05-27 VITALS — BP 156/65 | HR 54 | Temp 98.1°F | Resp 18 | Wt 246.0 lb

## 2015-05-27 DIAGNOSIS — C787 Secondary malignant neoplasm of liver and intrahepatic bile duct: Secondary | ICD-10-CM

## 2015-05-27 DIAGNOSIS — Z5112 Encounter for antineoplastic immunotherapy: Secondary | ICD-10-CM | POA: Diagnosis not present

## 2015-05-27 DIAGNOSIS — C182 Malignant neoplasm of ascending colon: Secondary | ICD-10-CM | POA: Diagnosis not present

## 2015-05-27 DIAGNOSIS — L270 Generalized skin eruption due to drugs and medicaments taken internally: Secondary | ICD-10-CM | POA: Diagnosis not present

## 2015-05-27 DIAGNOSIS — C189 Malignant neoplasm of colon, unspecified: Secondary | ICD-10-CM

## 2015-05-27 LAB — COMPREHENSIVE METABOLIC PANEL
ALK PHOS: 68 U/L (ref 38–126)
ALT: 15 U/L (ref 14–54)
ANION GAP: 8 (ref 5–15)
AST: 18 U/L (ref 15–41)
Albumin: 3.7 g/dL (ref 3.5–5.0)
BUN: 9 mg/dL (ref 6–20)
CO2: 28 mmol/L (ref 22–32)
Calcium: 8.3 mg/dL — ABNORMAL LOW (ref 8.9–10.3)
Chloride: 103 mmol/L (ref 101–111)
Creatinine, Ser: 0.52 mg/dL (ref 0.44–1.00)
GFR calc Af Amer: >60 mL/min (ref >60)
GFR calc non Af Amer: >60 mL/min (ref >60)
GLUCOSE: 214 mg/dL — AB (ref 65–99)
Potassium: 3.8 mmol/L (ref 3.5–5.1)
Sodium: 139 mmol/L (ref 135–145)
TOTAL PROTEIN: 6.7 g/dL (ref 6.5–8.1)
Total Bilirubin: 0.5 mg/dL (ref 0.3–1.2)

## 2015-05-27 LAB — CBC WITH DIFFERENTIAL/PLATELET
Basophils Absolute: 0 10*3/uL (ref 0.0–0.1)
Basophils Relative: 0 % (ref 0–1)
Eosinophils Absolute: 0.2 10*3/uL (ref 0.0–0.7)
Eosinophils Relative: 4 % (ref 0–5)
HEMATOCRIT: 39.6 % (ref 36.0–46.0)
HEMOGLOBIN: 13.1 g/dL (ref 12.0–15.0)
LYMPHS ABS: 2.4 10*3/uL (ref 0.7–4.0)
Lymphocytes Relative: 44 % (ref 12–46)
MCH: 26.9 pg (ref 26.0–34.0)
MCHC: 33.1 g/dL (ref 30.0–36.0)
MCV: 81.3 fL (ref 78.0–100.0)
MONOS PCT: 7 % (ref 3–12)
Monocytes Absolute: 0.4 10*3/uL (ref 0.1–1.0)
Neutro Abs: 2.4 10*3/uL (ref 1.7–7.7)
Neutrophils Relative %: 45 % (ref 43–77)
Platelets: 136 10*3/uL — ABNORMAL LOW (ref 150–400)
RBC: 4.87 MIL/uL (ref 3.87–5.11)
RDW: 13.4 % (ref 11.5–15.5)
WBC: 5.3 10*3/uL (ref 4.0–10.5)

## 2015-05-27 LAB — MAGNESIUM: MAGNESIUM: 1.6 mg/dL — AB (ref 1.7–2.4)

## 2015-05-27 MED ORDER — PANITUMUMAB CHEMO INJECTION 100 MG/5ML
6.0000 mg/kg | Freq: Once | INTRAVENOUS | Status: AC
  Administered 2015-05-27: 700 mg via INTRAVENOUS
  Filled 2015-05-27: qty 35

## 2015-05-27 MED ORDER — SODIUM CHLORIDE 0.9 % IJ SOLN: 10.0000 mL | INTRAMUSCULAR | Status: DC | PRN

## 2015-05-27 MED ORDER — SODIUM CHLORIDE 0.9 % IV SOLN
Freq: Once | INTRAVENOUS | Status: AC
  Administered 2015-05-27: 13:00:00 via INTRAVENOUS

## 2015-05-27 MED ORDER — HEPARIN SOD (PORK) LOCK FLUSH 100 UNIT/ML IV SOLN
500.0000 [IU] | Freq: Once | INTRAVENOUS | Status: AC | PRN
  Administered 2015-05-27: 500 [IU]
  Filled 2015-05-27: qty 5

## 2015-05-27 MED ORDER — OXYCODONE-ACETAMINOPHEN 5-325 MG PO TABS: ORAL_TABLET | ORAL | Status: AC

## 2015-05-27 NOTE — Progress Notes (Signed)
Patient tolerated infusion well.  VSS.   

## 2015-05-27 NOTE — Patient Instructions (Signed)
[place] at [facility] Discharge Instructions  RECOMMENDATIONS MADE BY THE CONSULTANT AND ANY TEST RESULTS WILL BE SENT TO YOUR REFERRING PHYSICIAN.  Exam completed by Kirby Crigler today Chemotherapy as scheduled today Refill on pain medication Continue taking magnesium Continue soaking foot for one more week Please follow up as scheduled    Thank you for choosing [place] at [facility] to provide your oncology and hematology care.  To afford each patient quality time with our provider, please arrive at least 15 minutes before your scheduled appointment time.    You need to re-schedule your appointment should you arrive 10 or more minutes late.  We strive to give you quality time with our providers, and arriving late affects you and other patients whose appointments are after yours.  Also, if you no show three or more times for appointments you may be dismissed from the clinic at the providers discretion.     Again, thank you for choosing [facility].  Our hope is that these requests will decrease the amount of time that you wait before being seen by our physicians.       _____________________________________________________________  Should you have questions after your visit to [facility], please contact our office at [phone] between the hours of 8:30 a.m. and 4:30 p.m.  Voicemails left after 4:30 p.m. will not be returned until the following business day.  For prescription refill requests, have your pharmacy contact our office.

## 2015-05-27 NOTE — Progress Notes (Signed)
Monica Cheadle, PA-C [address] [place] [address]  Invasive adenocarcinoma of colon - Plan: oxyCODONE-acetaminophen (PERCOCET/ROXICET) 5-325 MG per tablet  CURRENT THERAPY: Panitumumab  INTERVAL HISTORY: Monica Allen 59 y.o. 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 Aldean Ast on 12/13/2012. At that time he did TAH and BSO with lysis of adhesions, partial omentectomy, and resection of pelvic peritoneal implant. At the completion of the surgical procedure there is no evidence of residual disease. Her CEA is within the normal range. Her cancer however did not mark as ovarian in origin and a subsequent colonoscopy revealed a right ascending colon cancer. This was discovered by Dr. Barney Drain on 02/14/2013. It was near circumferential.     Invasive adenocarcinoma of colon   01/02/2013 Initial Diagnosis Left ovary and fallopian tube is positive for invasive adenocarcinoma with LVI and positive pelvis biopsy    02/14/2013 Procedure Ascending colon biopsy revealing invasive adenocarcinoma.  KRAS wildtype   03/01/2013 Surgery Left segmental resection showing invasive adenocarcinoma through the muscularis propria into the pericolonoc fat involving the serosa with LVI.  Clear margins.  11/19 positive lymph nodes for disease   04/04/2013 - 09/12/2013 Chemotherapy FOLFOX + Avastin x 12 cycles   09/26/2013 Imaging CT CAP- No definite signs of metastatic disease in the chest, abdomen or pelvis   10/02/2013 - 10/24/2014 Chemotherapy Avastin + Xeloda maintenance.  Avstin dose decreased to 7.5 mg/kg on 01/07/2014 due to poorly controlled HTN.   12/21/2013 Imaging CT CAP- Negative for recurrent or metastatic disease in the chest, abdomen or pelvis.    03/14/2014 Imaging CT CAP- No evidence of thoracic metastasis. No evidence metastasis within the abdomen or pelvis.   07/25/2014 Imaging CT CAP-he slight increase in mediastinal and peripancreatic lymph nodes with a new nodule in the right middle lobe most compatible with inflammatory processes.   11/07/2014 Imaging Mildly increased lymphadenopathy within the chest and abdomen, consistent with metastatic disease.Increased size of right middle lobe pulmonary nodule.Stable multinodular goiter and large anterior abdominal wall hernia.   12/23/2014 - 12/27/2014 Hospital Admission SBO, not felt to be "malignancy related" but secondary to chronically incarcerated ventral incisional hernia   01/07/2015 -  Chemotherapy Panitumumab started   02/04/2015 Adverse Reaction Panitumumab-induced acne rash, expected with treatment.     I personally reviewed and went over laboratory results with the patient.  The results are noted within this dictation.  She is tolerating therapy well.  Her acne-rash is noted and is secondary to treatment.  She is using her prescriptions appropriately for this side effect.  It is not grade 3 and therefore does not require dose modification at this time.   Her right #1 toe is much improved.  She does not have any open areas.  Skin is not erythematous.  No discharge.  No pain to palpation.   Oncologically, she denies any complaints and ROS questioning is negative.  Past Medical History  Diagnosis Date  . Diabetes mellitus   . Hypertension   . Enlarged heart   . Goiter   . Nonischemic cardiomyopathy   . Anemia   . Abdominal hernia   . 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 Invasive adenocarcinoma of colon 03/08/2013    11/19 LNs POS, THRU TO ADIPOSE TISSUE    has THYROMEGALY; DIABETES MELLITUS, UNCONTROLLED, WITH RENAL COMPLICATIONS; ANXIETY; DEPRESSION; HYPERTENSION; CARDIOMYOPATHY,  DILATED; CONGESTIVE HEART FAILURE; SINUSITIS, CHRONIC; ALLERGIC RHINITIS; GERD; ONYCHIA AND PARONYCHIA OF TOE; LOW BACK PAIN; Diabetes mellitus; Invasive adenocarcinoma of colon; SBO (small bowel obstruction); Type  2 diabetes mellitus; Essential hypertension; and Chronic CHF (congestive heart failure) on her problem list.     is allergic to actos and ibuprofen.  [name] had no medications administered during this visit.  Past Surgical History  Procedure Laterality Date  . Wisdom tooth extraction    . Laparotomy  12/13/2012    Procedure: EXPLORATORY LAPAROTOMY;  Surgeon: Alvino Chapel, MD;  Location: [facility];  Service: Gynecology;  Laterality: N/A;  . Abdominal hysterectomy  12/13/2012    Procedure: HYSTERECTOMY ABDOMINAL;  Surgeon: Alvino Chapel, MD;  Location: [facility];  Service: Gynecology;  Laterality: N/A;  . Salpingoophorectomy  12/13/2012    Procedure: SALPINGO OOPHORECTOMY;  Surgeon: Alvino Chapel, MD;  Location: [facility];  Service: Gynecology;  Laterality: Bilateral;  . Omentectomy  12/13/2012    Procedure: OMENTECTOMY;  Surgeon: Alvino Chapel, MD;  Location: [facility];  Service: Gynecology;  Laterality: N/A;  . Colonoscopy N/A 02/14/2013    Procedure: COLONOSCOPY;  Surgeon: Danie Binder, MD;  Location: [facility];  Service: Endoscopy;  Laterality: N/A;  2:00 PM-moved to [place] notified pt  . Partial colectomy N/A 03/01/2013    Procedure: PARTIAL COLECTOMY;  Surgeon: Jamesetta So, MD;  Location: [facility];  Service: General;  Laterality: N/A;  . Portacath placement Left 03/01/2013    Procedure: INSERTION PORT-A-CATH;  Surgeon: Jamesetta So, MD;  Location: [facility];  Service: General;  Laterality: Left;    Denies any headaches, dizziness, double vision, fevers, chills, night sweats, nausea, vomiting, diarrhea, constipation, chest pain, heart palpitations, shortness of breath, blood in stool, black tarry stool, urinary pain, urinary burning, urinary frequency, hematuria.   PHYSICAL EXAMINATION  ECOG PERFORMANCE STATUS: 0 - Asymptomatic  Filed Vitals:   05/27/15 1100  BP: 136/66  Pulse: 58  Temp: 98.4 F (36.9 C)  Resp: 20    GENERAL:alert, no distress,  well nourished, well developed, comfortable, cooperative, obese, smiling and accompanied by husband. SKIN: skin color, texture, turgor are normal, no rashes or significant lesions, acne facial and upper chest rash HEAD: Normocephalic, No masses, lesions, tenderness or abnormalities EYES: normal, PERRLA EARS: External ears normal OROPHARYNX:lips, buccal mucosa, and tongue normal and mucous membranes are moist  NECK: supple, no adenopathy, thyroid normal size, non-tender, without nodularity, no stridor, non-tender, trachea midline LYMPH:  no palpable lymphadenopathy BREAST:not examined LUNGS: clear to auscultation and percussion HEART: regular rate & rhythm, no murmurs, no gallops, S1 normal and S2 normal ABDOMEN:abdomen soft, non-tender, obese, normal bowel sounds and no masses or organomegaly BACK: Back symmetric, no curvature., No CVA tenderness EXTREMITIES:less then 2 second capillary refill, no joint deformities, effusion, or inflammation, no skin discoloration, no clubbing, no cyanosis.  Right #1 toe is nontender without discharge or erythema. NEURO: alert & oriented x 3 with fluent speech, no focal motor/sensory deficits, gait normal   LABORATORY DATA: CBC    Component Value Date/Time   WBC 5.3 05/27/2015 1110   RBC 4.87 05/27/2015 1110   HGB 13.1 05/27/2015 1110   HCT 39.6 05/27/2015 1110   PLT 136* 05/27/2015 1110   MCV 81.3 05/27/2015 1110   MCH 26.9 05/27/2015 1110   MCHC 33.1 05/27/2015 1110   RDW 13.4 05/27/2015 1110   LYMPHSABS 2.4 05/27/2015 1110   MONOABS 0.4 05/27/2015 1110   EOSABS 0.2 05/27/2015  1110   BASOSABS 0.0 05/27/2015 1110      Chemistry      Component Value Date/Time   NA 139 05/27/2015 1110   K 3.8 05/27/2015 1110   CL 103 05/27/2015 1110   CO2 28 05/27/2015 1110   BUN 9 05/27/2015 1110   CREATININE 0.52 05/27/2015 1110      Component Value Date/Time   CALCIUM 8.3* 05/27/2015 1110   ALKPHOS 68 05/27/2015 1110   AST 18 05/27/2015 1110   ALT  15 05/27/2015 1110   BILITOT 0.5 05/27/2015 1110     Lab Results  Component Value Date   CEA 2.8 10/03/2014     ASSESSMENT AND PLAN:  Invasive adenocarcinoma of colon 59 year old female with stage IV colon cancer currently on single agent Vectibux.   Pre-chemotherapy labs as planned today.  Restaging scans in about 1 weeks: CT CAP  I recommend continued Epsom salts baths for right toe.  She also can apply antibiotic ointment .  Return in 2 weeks for follow-up, pre-chemo labs, review of imaging tests, and treatment.     THERAPY PLAN:  Continue with therapy as planned and monitor for toxicities of treatment and progression of disease.  All questions were answered. The patient knows to call the clinic with any problems, questions or concerns. We can certainly see the patient much sooner if necessary.  Patient and plan discussed with Dr. Ancil Linsey and she is in agreement with the aforementioned.   This note is electronically signed by: Robynn Pane 05/27/2015 3:55 PM

## 2015-05-27 NOTE — Assessment & Plan Note (Addendum)
59 year old female with stage IV colon cancer currently on single agent Vectibux.   Pre-chemotherapy labs as planned today.  Restaging scans in about 1 weeks: CT CAP  I recommend continued Epsom salts baths for right toe.  She also can apply antibiotic ointment .  Return in 2 weeks for follow-up, pre-chemo labs, review of imaging tests, and treatment.

## 2015-06-10 ENCOUNTER — Encounter (HOSPITAL_BASED_OUTPATIENT_CLINIC_OR_DEPARTMENT_OTHER): Payer: BLUE CROSS/BLUE SHIELD | Admitting: Oncology

## 2015-06-10 ENCOUNTER — Other Ambulatory Visit (HOSPITAL_COMMUNITY): Payer: Self-pay | Admitting: Oncology

## 2015-06-10 ENCOUNTER — Encounter (HOSPITAL_COMMUNITY): Payer: BLUE CROSS/BLUE SHIELD | Attending: Oncology

## 2015-06-10 ENCOUNTER — Encounter (HOSPITAL_COMMUNITY): Payer: Self-pay | Admitting: Oncology

## 2015-06-10 VITALS — BP 153/76 | HR 56 | Temp 97.8°F | Resp 20 | Wt 242.6 lb

## 2015-06-10 VITALS — BP 157/71 | HR 58 | Temp 97.5°F | Resp 18

## 2015-06-10 DIAGNOSIS — C787 Secondary malignant neoplasm of liver and intrahepatic bile duct: Secondary | ICD-10-CM | POA: Diagnosis not present

## 2015-06-10 DIAGNOSIS — C182 Malignant neoplasm of ascending colon: Secondary | ICD-10-CM

## 2015-06-10 DIAGNOSIS — Z5112 Encounter for antineoplastic immunotherapy: Secondary | ICD-10-CM

## 2015-06-10 DIAGNOSIS — I10 Essential (primary) hypertension: Secondary | ICD-10-CM

## 2015-06-10 DIAGNOSIS — C189 Malignant neoplasm of colon, unspecified: Secondary | ICD-10-CM

## 2015-06-10 LAB — CBC WITH DIFFERENTIAL/PLATELET
BASOS PCT: 0 % (ref 0–1)
Basophils Absolute: 0 10*3/uL (ref 0.0–0.1)
EOS PCT: 4 % (ref 0–5)
Eosinophils Absolute: 0.2 10*3/uL (ref 0.0–0.7)
HEMATOCRIT: 39.5 % (ref 36.0–46.0)
Hemoglobin: 12.9 g/dL (ref 12.0–15.0)
Lymphocytes Relative: 41 % (ref 12–46)
Lymphs Abs: 2.5 10*3/uL (ref 0.7–4.0)
MCH: 26.5 pg (ref 26.0–34.0)
MCHC: 32.7 g/dL (ref 30.0–36.0)
MCV: 81.1 fL (ref 78.0–100.0)
Monocytes Absolute: 0.4 10*3/uL (ref 0.1–1.0)
Monocytes Relative: 7 % (ref 3–12)
NEUTROS PCT: 48 % (ref 43–77)
Neutro Abs: 2.9 10*3/uL (ref 1.7–7.7)
Platelets: 137 10*3/uL — ABNORMAL LOW (ref 150–400)
RBC: 4.87 MIL/uL (ref 3.87–5.11)
RDW: 13.1 % (ref 11.5–15.5)
WBC: 6 10*3/uL (ref 4.0–10.5)

## 2015-06-10 LAB — COMPREHENSIVE METABOLIC PANEL
ALK PHOS: 65 U/L (ref 38–126)
ALT: 12 U/L — ABNORMAL LOW (ref 14–54)
ANION GAP: 9 (ref 5–15)
AST: 15 U/L (ref 15–41)
Albumin: 3.7 g/dL (ref 3.5–5.0)
BUN: 10 mg/dL (ref 6–20)
CO2: 28 mmol/L (ref 22–32)
Calcium: 9.3 mg/dL (ref 8.9–10.3)
Chloride: 102 mmol/L (ref 101–111)
Creatinine, Ser: 0.53 mg/dL (ref 0.44–1.00)
GFR calc Af Amer: >60 mL/min (ref >60)
GFR calc non Af Amer: >60 mL/min (ref >60)
Glucose, Bld: 282 mg/dL — ABNORMAL HIGH (ref 65–99)
Potassium: 3.6 mmol/L (ref 3.5–5.1)
SODIUM: 139 mmol/L (ref 135–145)
Total Bilirubin: 0.5 mg/dL (ref 0.3–1.2)
Total Protein: 6.8 g/dL (ref 6.5–8.1)

## 2015-06-10 LAB — MAGNESIUM: Magnesium: 1.3 mg/dL — ABNORMAL LOW (ref 1.7–2.4)

## 2015-06-10 MED ORDER — MAGNESIUM SULFATE 2 GM/50ML IV SOLN
2.0000 g | Freq: Once | INTRAVENOUS | Status: DC
  Administered 2015-06-10: 2 g via INTRAVENOUS
  Filled 2015-06-10: qty 50

## 2015-06-10 MED ORDER — HEPARIN SOD (PORK) LOCK FLUSH 100 UNIT/ML IV SOLN
500.0000 [IU] | Freq: Once | INTRAVENOUS | Status: AC | PRN
  Administered 2015-06-10: 500 [IU]
  Filled 2015-06-10: qty 5

## 2015-06-10 MED ORDER — PANITUMUMAB CHEMO INJECTION 100 MG/5ML
6.0000 mg/kg | Freq: Once | INTRAVENOUS | Status: AC
  Administered 2015-06-10: 700 mg via INTRAVENOUS
  Filled 2015-06-10: qty 35

## 2015-06-10 MED ORDER — MAGNESIUM OXIDE 400 MG PO TABS: ORAL_TABLET | ORAL | Status: AC

## 2015-06-10 MED ORDER — SODIUM CHLORIDE 0.9 % IV SOLN
Freq: Once | INTRAVENOUS | Status: AC
  Administered 2015-06-10: 10:00:00 via INTRAVENOUS

## 2015-06-10 MED ORDER — SODIUM CHLORIDE 0.9 % IJ SOLN
10.0000 mL | INTRAMUSCULAR | Status: DC | PRN
  Administered 2015-06-10: 10 mL
  Filled 2015-06-10: qty 10

## 2015-06-10 NOTE — Progress Notes (Signed)
Tolerated chemo and magnesium infusion well.

## 2015-06-10 NOTE — Addendum Note (Signed)
Addended by: Baird Cancer on: 06/10/2015 09:50 AM   Modules accepted: Orders, Level of Service

## 2015-06-10 NOTE — Patient Instructions (Signed)
[facility] Discharge Instructions for Patients Receiving Chemotherapy  Today you received the following chemotherapy agents Vectibix and a magnesium infusion.  If you develop nausea and vomiting that is not controlled by your nausea medication, call the clinic. If it is after clinic hours your family physician or the after hours number for the clinic or go to the Emergency Department.   BELOW ARE SYMPTOMS THAT SHOULD BE REPORTED IMMEDIATELY:  *FEVER GREATER THAN 101.0 F  *CHILLS WITH OR WITHOUT FEVER  NAUSEA AND VOMITING THAT IS NOT CONTROLLED WITH YOUR NAUSEA MEDICATION  *UNUSUAL SHORTNESS OF BREATH  *UNUSUAL BRUISING OR BLEEDING  TENDERNESS IN MOUTH AND THROAT WITH OR WITHOUT PRESENCE OF ULCERS  *URINARY PROBLEMS  *BOWEL PROBLEMS  UNUSUAL RASH Items with * indicate a potential emergency and should be followed up as soon as possible.  Return as scheduled.  I have been informed and understand all the instructions given to me. I know to contact the clinic, my physician, or go to the Emergency Department if any problems should occur. I do not have any questions at this time, but understand that I may call the clinic during office hours or the Patient Navigator at [phone] should I have any questions or need assistance in obtaining follow up care.    __________________________________________  _____________  __________ Signature of Patient or Authorized Representative            Date                   Time    __________________________________________ Nurse's Signature

## 2015-06-10 NOTE — Progress Notes (Addendum)
Monica Cheadle, PA-C [place] [id number]  Invasive adenocarcinoma of colon  Hypomagnesemia - Plan: magnesium oxide (MAG-OX) 400 MG tablet, magnesium sulfate IVPB 2 g 50 mL  CURRENT THERAPY: Panitumumab  INTERVAL HISTORY: Monica Allen 59 y.o. 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 Aldean Ast on 12/13/2012. At that time he did TAH and BSO with lysis of adhesions, partial omentectomy, and resection of pelvic peritoneal implant. At the completion of the surgical procedure there is no evidence of residual disease. Her CEA is within the normal range. Her cancer however did not mark as ovarian in origin and a subsequent colonoscopy revealed a right ascending colon cancer. This was discovered by Dr. Barney Drain on 02/14/2013. It was near circumferential.     Invasive adenocarcinoma of colon   01/02/2013 Initial Diagnosis Left ovary and fallopian tube is positive for invasive adenocarcinoma with LVI and positive pelvis biopsy    02/14/2013 Procedure Ascending colon biopsy revealing invasive adenocarcinoma.  KRAS wildtype   03/01/2013 Surgery Left segmental resection showing invasive adenocarcinoma through the muscularis propria into the pericolonoc fat involving the serosa with LVI.  Clear margins.  11/19 positive lymph nodes for disease   04/04/2013 - 09/12/2013 Chemotherapy FOLFOX + Avastin x 12 cycles   09/26/2013 Imaging CT CAP- No definite signs of metastatic disease in the chest, abdomen or pelvis   10/02/2013 - 10/24/2014 Chemotherapy Avastin + Xeloda maintenance.  Avstin dose decreased to 7.5 mg/kg on 01/07/2014 due to poorly controlled HTN.   12/21/2013 Imaging CT CAP- Negative for recurrent or metastatic disease in the chest, abdomen or pelvis.    03/14/2014 Imaging CT CAP- No evidence of thoracic metastasis. No evidence metastasis within the  abdomen or pelvis.   07/25/2014 Imaging CT CAP-he slight increase in mediastinal and peripancreatic lymph nodes with a new nodule in the right middle lobe most compatible with inflammatory processes.   11/07/2014 Imaging Mildly increased lymphadenopathy within the chest and abdomen, consistent with metastatic disease.Increased size of right middle lobe pulmonary nodule.Stable multinodular goiter and large anterior abdominal wall hernia.   12/23/2014 - 12/27/2014 Hospital Admission SBO, not felt to be "malignancy related" but secondary to chronically incarcerated ventral incisional hernia   01/07/2015 -  Chemotherapy Panitumumab started   02/04/2015 Adverse Reaction Panitumumab-induced acne rash, expected with treatment.     I personally reviewed and went over laboratory results with the patient.  The results are noted within this dictation.  She reports that her toe continues to improve.  It is back to baseline.  She is tolerating therapy well without any therapy-related complaints.  Past Medical History  Diagnosis Date  . Diabetes mellitus   . Hypertension   . Enlarged heart   . Goiter   . Nonischemic cardiomyopathy   . Anemia   . Abdominal hernia   . 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     is allergic to  actos and ibuprofen.  Monica Allen had no medications administered during this visit.  Past Surgical History  Procedure Laterality Date  . Wisdom tooth  extraction    . Laparotomy  12/13/2012    Procedure: EXPLORATORY LAPAROTOMY;  Surgeon: Alvino Chapel, MD;  Location: [facility];  Service: Gynecology;  Laterality: N/A;  . Abdominal hysterectomy  12/13/2012    Procedure: HYSTERECTOMY ABDOMINAL;  Surgeon: Alvino Chapel, MD;  Location: [facility];  Service: Gynecology;  Laterality: N/A;  . Salpingoophorectomy  12/13/2012    Procedure: SALPINGO OOPHORECTOMY;  Surgeon: Alvino Chapel, MD;  Location: [facility];  Service: Gynecology;  Laterality: Bilateral;  . Omentectomy  12/13/2012    Procedure: OMENTECTOMY;  Surgeon: Alvino Chapel, MD;  Location: [facility];  Service: Gynecology;  Laterality: N/A;  . Colonoscopy N/A 02/14/2013    Procedure: COLONOSCOPY;  Surgeon: Danie Binder, MD;  Location: [facility];  Service: Endoscopy;  Laterality: N/A;  2:00 PM-moved to [place] notified pt  . Partial colectomy N/A 03/01/2013    Procedure: PARTIAL COLECTOMY;  Surgeon: Jamesetta So, MD;  Location: [facility];  Service: General;  Laterality: N/A;  . Portacath placement Left 03/01/2013    Procedure: INSERTION PORT-A-CATH;  Surgeon: Jamesetta So, MD;  Location: [facility];  Service: General;  Laterality: Left;    Denies any headaches, dizziness, double vision, fevers, chills, night sweats, nausea, vomiting, diarrhea, constipation, chest pain, heart palpitations, shortness of breath, blood in stool, black tarry stool, urinary pain, urinary burning, urinary frequency, hematuria.   PHYSICAL EXAMINATION  ECOG PERFORMANCE STATUS: 0 - Asymptomatic  Filed Vitals:   06/10/15 0800  BP: 153/76  Pulse: 56  Temp: 97.8 F (36.6 C)  Resp: 20    GENERAL:alert, no distress, well nourished, well developed, comfortable, cooperative, obese, smiling and accompanied by husband. SKIN: skin color, texture, turgor are normal, no rashes or significant lesions, acne-like rash on face. HEAD: Normocephalic, No masses, lesions, tenderness or abnormalities EYES:  normal, PERRLA EARS: External ears normal OROPHARYNX:lips, buccal mucosa, and tongue normal and mucous membranes are moist  NECK: supple, no adenopathy, thyroid normal size, non-tender, without nodularity, no stridor, non-tender, trachea midline LYMPH:  no palpable lymphadenopathy BREAST:not examined LUNGS: clear to auscultation and percussion HEART: regular rate & rhythm, no murmurs, no gallops, S1 normal and S2 normal ABDOMEN:abdomen soft, non-tender, obese, normal bowel sounds and no masses or organomegaly BACK: Back symmetric, no curvature., No CVA tenderness EXTREMITIES:less then 2 second capillary refill, no joint deformities, effusion, or inflammation, no skin discoloration, no clubbing, no cyanosis.  Right #1 toe is nontender without discharge or erythema. NEURO: alert & oriented x 3 with fluent speech, no focal motor/sensory deficits, gait normal   LABORATORY DATA: CBC    Component Value Date/Time   WBC 6.0 06/10/2015 0850   RBC 4.87 06/10/2015 0850   HGB 12.9 06/10/2015 0850   HCT 39.5 06/10/2015 0850   PLT 137* 06/10/2015 0850   MCV 81.1 06/10/2015 0850   MCH 26.5 06/10/2015 0850   MCHC 32.7 06/10/2015 0850   RDW 13.1 06/10/2015 0850   LYMPHSABS 2.5 06/10/2015 0850   MONOABS 0.4 06/10/2015 0850   EOSABS 0.2 06/10/2015 0850   BASOSABS 0.0 06/10/2015 0850      Chemistry      Component Value Date/Time   NA 139 06/10/2015 0850   K 3.6 06/10/2015 0850   CL 102 06/10/2015 0850   CO2 28 06/10/2015 0850   BUN 10 06/10/2015 0850  CREATININE 0.53 06/10/2015 0850      Component Value Date/Time   CALCIUM 9.3 06/10/2015 0850   ALKPHOS 65 06/10/2015 0850   AST 15 06/10/2015 0850   ALT 12* 06/10/2015 0850   BILITOT 0.5 06/10/2015 0850     Lab Results  Component Value Date   CEA 2.8 10/03/2014     ASSESSMENT AND PLAN:  Invasive adenocarcinoma of colon 59 year old female with stage IV colon cancer currently on single agent Vectibux.   Pre-chemotherapy labs as  planned today.  Restaging scans in about 1 weeks: CT CAP  She requests a refill on Glipizide and Jentadueto.  Both of these medication are managed by her primary care Kendra Woolford.  Therefore, I will defer refills to her primary care Doloras Tellado.  Magnesium refilled and escribed to her pharmacy.  Hypomagnesemia noted today.  Will give 2 g IV Mag today.  Return in 2 weeks for follow-up, pre-chemo labs, review of imaging tests, and treatment.     THERAPY PLAN:  Continue with therapy as planned and monitor for toxicities of treatment and progression of disease.  All questions were answered. The patient knows to call the clinic with any problems, questions or concerns. We can certainly see the patient much sooner if necessary.  Patient and plan discussed with Dr. Ancil Linsey and she is in agreement with the aforementioned.   This note is electronically signed by: Robynn Pane 06/10/2015 9:50 AM

## 2015-06-10 NOTE — Patient Instructions (Signed)
[place] [facility] Discharge Instructions  RECOMMENDATIONS MADE BY THE CONSULTANT AND ANY TEST RESULTS WILL BE SENT TO YOUR REFERRING PHYSICIAN.  Return in 2 weeks for chemo and find out scan results. Your meds were refilled. CT scan in 1-2 weeks.  Thank you for choosing [facility] to provide your oncology and hematology care.  To afford each patient quality time with our provider, please arrive at least 15 minutes before your scheduled appointment time.    You need to re-schedule your appointment should you arrive 10 or more minutes late.  We strive to give you quality time with our providers, and arriving late affects you and other patients whose appointments are after yours.  Also, if you no show three or more times for appointments you may be dismissed from the clinic at the providers discretion.     Again, thank you for choosing [facility].  Our hope is that these requests will decrease the amount of time that you wait before being seen by our physicians.       _____________________________________________________________  Should you have questions after your visit to [facility], please contact our office at [phone] between the hours of 8:30 a.m. and 4:30 p.m.  Voicemails left after 4:30 p.m. will not be returned until the following business day.  For prescription refill requests, have your pharmacy contact our office.

## 2015-06-10 NOTE — Assessment & Plan Note (Addendum)
59 year old female with stage IV colon cancer currently on single agent Vectibux.   Pre-chemotherapy labs as planned today.  Restaging scans in about 1 weeks: CT CAP  She requests a refill on Glipizide and Jentadueto.  Both of these medication are managed by her primary care provider.  Therefore, I will defer refills to her primary care provider.  Magnesium refilled and escribed to her pharmacy.  Hypomagnesemia noted today.  Will give 2 g IV Mag today.  Return in 2 weeks for follow-up, pre-chemo labs, review of imaging tests, and treatment.

## 2015-06-17 ENCOUNTER — Ambulatory Visit (HOSPITAL_COMMUNITY)
Admission: RE | Admit: 2015-06-17 | Discharge: 2015-06-17 | Disposition: A | Discharge status: Home / Self Care | Payer: BLUE CROSS/BLUE SHIELD | Source: Ambulatory Visit | Attending: Hematology & Oncology | Admitting: Hematology & Oncology

## 2015-06-17 DIAGNOSIS — C7800 Secondary malignant neoplasm of unspecified lung: Secondary | ICD-10-CM | POA: Diagnosis not present

## 2015-06-17 DIAGNOSIS — R188 Other ascites: Secondary | ICD-10-CM | POA: Insufficient documentation

## 2015-06-17 DIAGNOSIS — C7972 Secondary malignant neoplasm of left adrenal gland: Secondary | ICD-10-CM | POA: Insufficient documentation

## 2015-06-17 DIAGNOSIS — Z08 Encounter for follow-up examination after completed treatment for malignant neoplasm: Secondary | ICD-10-CM | POA: Insufficient documentation

## 2015-06-17 DIAGNOSIS — C7971 Secondary malignant neoplasm of right adrenal gland: Secondary | ICD-10-CM | POA: Diagnosis not present

## 2015-06-17 DIAGNOSIS — Z9049 Acquired absence of other specified parts of digestive tract: Secondary | ICD-10-CM | POA: Diagnosis not present

## 2015-06-17 DIAGNOSIS — C778 Secondary and unspecified malignant neoplasm of lymph nodes of multiple regions: Secondary | ICD-10-CM | POA: Insufficient documentation

## 2015-06-17 DIAGNOSIS — C189 Malignant neoplasm of colon, unspecified: Secondary | ICD-10-CM | POA: Diagnosis not present

## 2015-06-17 DIAGNOSIS — Z9221 Personal history of antineoplastic chemotherapy: Secondary | ICD-10-CM | POA: Insufficient documentation

## 2015-06-17 MED ORDER — IOHEXOL 300 MG/ML  SOLN
100.0000 mL | Freq: Once | INTRAMUSCULAR | Status: AC | PRN
  Administered 2015-06-17: 100 mL via INTRAVENOUS

## 2015-06-22 ENCOUNTER — Emergency Department (HOSPITAL_COMMUNITY): Payer: BLUE CROSS/BLUE SHIELD

## 2015-06-22 ENCOUNTER — Inpatient Hospital Stay (HOSPITAL_COMMUNITY)
Admission: EM | Admit: 2015-06-22 | Discharge: 2015-07-11 | DRG: 354 | Disposition: E | Payer: BLUE CROSS/BLUE SHIELD | Attending: Internal Medicine | Admitting: Internal Medicine

## 2015-06-22 ENCOUNTER — Encounter (HOSPITAL_COMMUNITY): Payer: Self-pay | Admitting: *Deleted

## 2015-06-22 DIAGNOSIS — Z87891 Personal history of nicotine dependence: Secondary | ICD-10-CM | POA: Diagnosis not present

## 2015-06-22 DIAGNOSIS — I472 Ventricular tachycardia: Secondary | ICD-10-CM | POA: Diagnosis not present

## 2015-06-22 DIAGNOSIS — I469 Cardiac arrest, cause unspecified: Secondary | ICD-10-CM | POA: Diagnosis not present

## 2015-06-22 DIAGNOSIS — E119 Type 2 diabetes mellitus without complications: Secondary | ICD-10-CM | POA: Diagnosis present

## 2015-06-22 DIAGNOSIS — I429 Cardiomyopathy, unspecified: Secondary | ICD-10-CM | POA: Diagnosis present

## 2015-06-22 DIAGNOSIS — Z7982 Long term (current) use of aspirin: Secondary | ICD-10-CM

## 2015-06-22 DIAGNOSIS — E1142 Type 2 diabetes mellitus with diabetic polyneuropathy: Secondary | ICD-10-CM | POA: Diagnosis present

## 2015-06-22 DIAGNOSIS — R600 Localized edema: Secondary | ICD-10-CM

## 2015-06-22 DIAGNOSIS — K56609 Unspecified intestinal obstruction, unspecified as to partial versus complete obstruction: Secondary | ICD-10-CM | POA: Diagnosis present

## 2015-06-22 DIAGNOSIS — E669 Obesity, unspecified: Secondary | ICD-10-CM | POA: Diagnosis present

## 2015-06-22 DIAGNOSIS — R188 Other ascites: Secondary | ICD-10-CM | POA: Diagnosis present

## 2015-06-22 DIAGNOSIS — Z6841 Body Mass Index (BMI) 40.0 and over, adult: Secondary | ICD-10-CM

## 2015-06-22 DIAGNOSIS — I4891 Unspecified atrial fibrillation: Secondary | ICD-10-CM | POA: Diagnosis not present

## 2015-06-22 DIAGNOSIS — Z823 Family history of stroke: Secondary | ICD-10-CM | POA: Diagnosis not present

## 2015-06-22 DIAGNOSIS — Z833 Family history of diabetes mellitus: Secondary | ICD-10-CM | POA: Diagnosis not present

## 2015-06-22 DIAGNOSIS — I10 Essential (primary) hypertension: Secondary | ICD-10-CM | POA: Diagnosis present

## 2015-06-22 DIAGNOSIS — C189 Malignant neoplasm of colon, unspecified: Secondary | ICD-10-CM

## 2015-06-22 DIAGNOSIS — I471 Supraventricular tachycardia: Secondary | ICD-10-CM | POA: Diagnosis not present

## 2015-06-22 DIAGNOSIS — C786 Secondary malignant neoplasm of retroperitoneum and peritoneum: Secondary | ICD-10-CM | POA: Diagnosis present

## 2015-06-22 DIAGNOSIS — K430 Incisional hernia with obstruction, without gangrene: Principal | ICD-10-CM | POA: Diagnosis present

## 2015-06-22 DIAGNOSIS — Z9049 Acquired absence of other specified parts of digestive tract: Secondary | ICD-10-CM | POA: Diagnosis present

## 2015-06-22 DIAGNOSIS — K439 Ventral hernia without obstruction or gangrene: Secondary | ICD-10-CM | POA: Insufficient documentation

## 2015-06-22 DIAGNOSIS — R092 Respiratory arrest: Secondary | ICD-10-CM | POA: Diagnosis not present

## 2015-06-22 DIAGNOSIS — Z978 Presence of other specified devices: Secondary | ICD-10-CM

## 2015-06-22 DIAGNOSIS — Z9221 Personal history of antineoplastic chemotherapy: Secondary | ICD-10-CM | POA: Diagnosis not present

## 2015-06-22 DIAGNOSIS — G629 Polyneuropathy, unspecified: Secondary | ICD-10-CM

## 2015-06-22 DIAGNOSIS — E860 Dehydration: Secondary | ICD-10-CM | POA: Diagnosis present

## 2015-06-22 DIAGNOSIS — R109 Unspecified abdominal pain: Secondary | ICD-10-CM | POA: Diagnosis present

## 2015-06-22 DIAGNOSIS — Z8249 Family history of ischemic heart disease and other diseases of the circulatory system: Secondary | ICD-10-CM

## 2015-06-22 DIAGNOSIS — R112 Nausea with vomiting, unspecified: Secondary | ICD-10-CM

## 2015-06-22 DIAGNOSIS — K5669 Other intestinal obstruction: Secondary | ICD-10-CM | POA: Diagnosis not present

## 2015-06-22 DIAGNOSIS — C182 Malignant neoplasm of ascending colon: Secondary | ICD-10-CM | POA: Diagnosis present

## 2015-06-22 LAB — COMPREHENSIVE METABOLIC PANEL
ALK PHOS: 74 U/L (ref 38–126)
ALT: 16 U/L (ref 14–54)
AST: 21 U/L (ref 15–41)
Albumin: 3.7 g/dL (ref 3.5–5.0)
Anion gap: 13 (ref 5–15)
BUN: 12 mg/dL (ref 6–20)
CALCIUM: 8.7 mg/dL — AB (ref 8.9–10.3)
CO2: 23 mmol/L (ref 22–32)
Chloride: 99 mmol/L — ABNORMAL LOW (ref 101–111)
Creatinine, Ser: 0.6 mg/dL (ref 0.44–1.00)
Glucose, Bld: 279 mg/dL — ABNORMAL HIGH (ref 65–99)
Potassium: 3.8 mmol/L (ref 3.5–5.1)
SODIUM: 135 mmol/L (ref 135–145)
Total Bilirubin: 1.4 mg/dL — ABNORMAL HIGH (ref 0.3–1.2)
Total Protein: 7 g/dL (ref 6.5–8.1)

## 2015-06-22 LAB — URINALYSIS, ROUTINE W REFLEX MICROSCOPIC
GLUCOSE, UA: 500 mg/dL — AB
HGB URINE DIPSTICK: NEGATIVE
Ketones, ur: >80 mg/dL — AB
LEUKOCYTES UA: NEGATIVE
NITRITE: NEGATIVE
PROTEIN: 100 mg/dL — AB
Urobilinogen, UA: 1 mg/dL (ref 0.0–1.0)
pH: 6 (ref 5.0–8.0)

## 2015-06-22 LAB — CBC WITH DIFFERENTIAL/PLATELET
BASOS PCT: 0 % (ref 0–1)
Basophils Absolute: 0 10*3/uL (ref 0.0–0.1)
Eosinophils Absolute: 0.1 10*3/uL (ref 0.0–0.7)
Eosinophils Relative: 1 % (ref 0–5)
HCT: 46.7 % — ABNORMAL HIGH (ref 36.0–46.0)
HEMOGLOBIN: 15.5 g/dL — AB (ref 12.0–15.0)
Lymphocytes Relative: 34 % (ref 12–46)
Lymphs Abs: 2.6 10*3/uL (ref 0.7–4.0)
MCH: 26.5 pg (ref 26.0–34.0)
MCHC: 33.2 g/dL (ref 30.0–36.0)
MCV: 80 fL (ref 78.0–100.0)
MONO ABS: 0.5 10*3/uL (ref 0.1–1.0)
MONOS PCT: 7 % (ref 3–12)
Neutro Abs: 4.4 10*3/uL (ref 1.7–7.7)
Neutrophils Relative %: 58 % (ref 43–77)
PLATELETS: 164 10*3/uL (ref 150–400)
RBC: 5.84 MIL/uL — AB (ref 3.87–5.11)
RDW: 13.2 % (ref 11.5–15.5)
WBC: 7.6 10*3/uL (ref 4.0–10.5)

## 2015-06-22 LAB — URINE MICROSCOPIC-ADD ON

## 2015-06-22 LAB — GLUCOSE, CAPILLARY: Glucose-Capillary: 276 mg/dL — ABNORMAL HIGH (ref 65–99)

## 2015-06-22 LAB — LIPASE, BLOOD: Lipase: <10 U/L — ABNORMAL LOW (ref 22–51)

## 2015-06-22 MED ORDER — HYDROMORPHONE HCL 1 MG/ML IJ SOLN
1.0000 mg | Freq: Once | INTRAMUSCULAR | Status: AC
  Administered 2015-06-22: 1 mg via INTRAVENOUS
  Filled 2015-06-22: qty 1

## 2015-06-22 MED ORDER — ONDANSETRON HCL 4 MG/2ML IJ SOLN
4.0000 mg | Freq: Once | INTRAMUSCULAR | Status: DC
  Filled 2015-06-22 (×2): qty 2

## 2015-06-22 MED ORDER — HEPARIN SODIUM (PORCINE) 5000 UNIT/ML IJ SOLN
5000.0000 [IU] | Freq: Three times a day (TID) | INTRAMUSCULAR | Status: DC
  Administered 2015-06-22 – 2015-06-23 (×3): 5000 [IU] via SUBCUTANEOUS
  Filled 2015-06-22 (×4): qty 1

## 2015-06-22 MED ORDER — HYDROMORPHONE HCL 1 MG/ML IJ SOLN
1.0000 mg | INTRAMUSCULAR | Status: DC | PRN
  Administered 2015-06-22 – 2015-06-24 (×9): 1 mg via INTRAVENOUS
  Filled 2015-06-22 (×9): qty 1

## 2015-06-22 MED ORDER — SODIUM CHLORIDE 0.45 % IV SOLN
INTRAVENOUS | Status: DC
  Administered 2015-06-22 – 2015-06-23 (×2): via INTRAVENOUS

## 2015-06-22 MED ORDER — ONDANSETRON HCL 4 MG/2ML IJ SOLN: 4.0000 mg | Freq: Once | INTRAMUSCULAR | Status: DC

## 2015-06-22 MED ORDER — FLUTICASONE PROPIONATE 50 MCG/ACT NA SUSP: 2.0000 | Freq: Every day | NASAL | Status: DC | PRN

## 2015-06-22 MED ORDER — INSULIN ASPART 100 UNIT/ML ~~LOC~~ SOLN
0.0000 [IU] | Freq: Every day | SUBCUTANEOUS | Status: DC
  Administered 2015-06-22: 3 [IU] via SUBCUTANEOUS
  Administered 2015-06-23: 2 [IU] via SUBCUTANEOUS

## 2015-06-22 MED ORDER — SODIUM CHLORIDE 0.9 % IV BOLUS (SEPSIS)
1000.0000 mL | Freq: Once | INTRAVENOUS | Status: AC
  Administered 2015-06-22: 1000 mL via INTRAVENOUS

## 2015-06-22 MED ORDER — MORPHINE SULFATE 4 MG/ML IJ SOLN: 4.0000 mg | Freq: Once | INTRAMUSCULAR | Status: DC

## 2015-06-22 MED ORDER — ONDANSETRON HCL 4 MG/2ML IJ SOLN: 4.0000 mg | Freq: Three times a day (TID) | INTRAMUSCULAR | Status: DC | PRN

## 2015-06-22 MED ORDER — POTASSIUM CHLORIDE 20 MEQ PO PACK
20.0000 meq | PACK | Freq: Every day | ORAL | Status: DC
  Administered 2015-06-23: 20 meq via ORAL
  Filled 2015-06-22: qty 1

## 2015-06-22 MED ORDER — MORPHINE SULFATE 4 MG/ML IJ SOLN
4.0000 mg | INTRAMUSCULAR | Status: DC | PRN
  Administered 2015-06-22: 4 mg via INTRAVENOUS
  Filled 2015-06-22: qty 1

## 2015-06-22 MED ORDER — MORPHINE SULFATE 4 MG/ML IJ SOLN
8.0000 mg | Freq: Once | INTRAMUSCULAR | Status: AC
  Administered 2015-06-22: 8 mg via INTRAMUSCULAR
  Filled 2015-06-22: qty 2

## 2015-06-22 MED ORDER — GABAPENTIN 300 MG PO CAPS
900.0000 mg | ORAL_CAPSULE | Freq: Two times a day (BID) | ORAL | Status: DC
  Administered 2015-06-23 – 2015-06-25 (×3): 900 mg via ORAL
  Filled 2015-06-22 (×5): qty 3

## 2015-06-22 MED ORDER — ONDANSETRON HCL 4 MG/2ML IJ SOLN
4.0000 mg | Freq: Four times a day (QID) | INTRAMUSCULAR | Status: DC | PRN
  Administered 2015-06-22 – 2015-06-24 (×5): 4 mg via INTRAVENOUS
  Filled 2015-06-22 (×5): qty 2

## 2015-06-22 MED ORDER — ONDANSETRON HCL 4 MG PO TABS: 4.0000 mg | ORAL_TABLET | Freq: Four times a day (QID) | ORAL | Status: DC | PRN

## 2015-06-22 MED ORDER — IOHEXOL 300 MG/ML  SOLN
100.0000 mL | Freq: Once | INTRAMUSCULAR | Status: AC | PRN
  Administered 2015-06-22: 100 mL via INTRAVENOUS

## 2015-06-22 MED ORDER — INSULIN ASPART 100 UNIT/ML ~~LOC~~ SOLN
0.0000 [IU] | Freq: Three times a day (TID) | SUBCUTANEOUS | Status: DC
  Administered 2015-06-23 (×2): 8 [IU] via SUBCUTANEOUS
  Administered 2015-06-23 – 2015-06-24 (×2): 5 [IU] via SUBCUTANEOUS
  Administered 2015-06-24: 3 [IU] via SUBCUTANEOUS
  Administered 2015-06-25 (×3): 5 [IU] via SUBCUTANEOUS

## 2015-06-22 MED ORDER — HYDRALAZINE HCL 20 MG/ML IJ SOLN: 5.0000 mg | INTRAMUSCULAR | Status: DC | PRN

## 2015-06-22 MED ORDER — MORPHINE SULFATE 4 MG/ML IJ SOLN
4.0000 mg | Freq: Once | INTRAMUSCULAR | Status: AC
  Administered 2015-06-22: 4 mg via INTRAVENOUS
  Filled 2015-06-22: qty 1

## 2015-06-22 MED ORDER — IOHEXOL 300 MG/ML  SOLN
25.0000 mL | Freq: Once | INTRAMUSCULAR | Status: AC | PRN
  Administered 2015-06-22: 25 mL via ORAL

## 2015-06-22 NOTE — Progress Notes (Signed)
10 Spoke with Dr.Merrell regarding patient's request for different pain medication d/t morphine being ineffective. New orders given to d/c morphine, give dilaudid 1mg  IV injection Q4H PRN for severe pain, adjust zofran to 4mg -8mg  PO/IV Q6H PRN for nausea/vomiting/upset stomach. Patient aware.

## 2015-06-22 NOTE — ED Notes (Signed)
Patient escorted to restroom.

## 2015-06-22 NOTE — ED Notes (Signed)
PA at bedside.

## 2015-06-22 NOTE — H&P (Signed)
[facility] History and Physical  AKIA MONTALBAN BZJ:[id number] DOB: 16-May-1956 DOA: 07/02/2015  Referring physician: Evalee Jefferson Piedra PCP: [name]   Chief Complaint: abd pain  HPI: SHIAN GOODNOW is a 59 y.o. female  Abdominal pain. Started 2 days ago. Intermittent at first but now constant.. Generalized.  Getting worse. Very little oral intake during this period time due to general anorexia. Associated with nausea and emesis 1 (after lunch). Denies any fevers or chills, hematochezia, hematemesis. Nothing improves her pain. Nothing makes it worse. No flatus or BM since onset of symptoms (typically daily BMs).  Review of Systems:  Constitutional:  No, night sweats, Fevers, chills, HEENT:  No headaches, Difficulty swallowing,Tooth/dental problems,Sore throat,  No sneezing, itching, ear ache, nasal congestion, post nasal drip,  Cardio-vascular:  No chest pain, Orthopnea, PND, swelling in lower extremities, anasarca, dizziness, palpitations  GI: Per HPI Resp:   No shortness of breath with exertion or at rest. No excess mucus, no productive cough, No non-productive cough, No coughing up of blood.No change in color of mucus.No wheezing.No chest wall deformity  Skin:  no rash or lesions.  GU:  no dysuria, change in color of urine, no urgency or frequency. No flank pain.  Musculoskeletal:   No joint pain or swelling. No decreased range of motion. No back pain.  Psych:  No change in mood or affect. No depression or anxiety. No memory loss.   Past Medical History  Diagnosis Date  . Diabetes mellitus   . Hypertension   . Enlarged heart   . Goiter   . Nonischemic cardiomyopathy   . Anemia   . Abdominal hernia   . 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 Wisdom tooth extraction    . Laparotomy  12/13/2012    Procedure:  EXPLORATORY LAPAROTOMY;  Surgeon: Alvino Chapel, MD;  Location: [facility];  Service: Gynecology;  Laterality: N/A;  . Abdominal hysterectomy  12/13/2012    Procedure: HYSTERECTOMY ABDOMINAL;  Surgeon: Alvino Chapel, MD;  Location: [facility];  Service: Gynecology;  Laterality: N/A;  . Salpingoophorectomy  12/13/2012    Procedure: SALPINGO OOPHORECTOMY;  Surgeon: Alvino Chapel, MD;  Location: [facility];  Service: Gynecology;  Laterality: Bilateral;  . Omentectomy  12/13/2012    Procedure: OMENTECTOMY;  Surgeon: Alvino Chapel, MD;  Location: [facility];  Service: Gynecology;  Laterality: N/A;  . Colonoscopy N/A 02/14/2013    Procedure: COLONOSCOPY;  Surgeon: Danie Binder, MD;  Location: [facility];  Service: Endoscopy;  Laterality: N/A;  2:00 PM-moved to [place] notified pt  . Partial colectomy N/A 03/01/2013    Procedure: PARTIAL COLECTOMY;  Surgeon: Jamesetta So, MD;  Location: [facility];  Service: General;  Laterality: N/A;  . Portacath placement Left 03/01/2013    Procedure: INSERTION PORT-A-CATH;  Surgeon: Jamesetta So, MD;  Location: [facility];  Service: General;  Laterality: Left;   Social History:  reports that she quit smoking about 27 years ago. Her smoking use included Cigarettes. She has a 37.5 pack-year smoking history. She has never used smokeless tobacco. She reports that she does not drink alcohol or use illicit drugs.  Allergies  Allergen Reactions  . Actos [Pioglitazone] Other (See Comments)    Heart doctor told me not to take this drug.   Marland Kitchen  Ibuprofen Other (See Comments)    "made my stomach hurt"    Family History  Problem Relation Age of Onset  . Diabetes Mother   . Hypertension Mother   . Diabetes Sister   . Hypertension Sister   . Hypertension Brother   . Stroke Maternal Aunt   . Diabetes Maternal Grandmother   . Hypertension Maternal Grandmother   . Colon cancer Neg Hx      Prior to Admission medications   Medication Sig Start Date End  Date Taking? Authorizing Provider  aspirin EC 81 MG tablet Take 81 mg by mouth every morning.    Yes Historical Provider, MD  carvedilol (COREG CR) 80 MG 24 hr capsule Take 80 mg by mouth daily with breakfast.    Yes Historical Provider, MD  desonide (DESOWEN) 0.05 % lotion Apply to face and neck twice daily for rash Patient taking differently: Apply 1 application topically 2 (two) times daily. Apply to face and neck twice daily for rash 02/04/15  Yes Patrici Ranks, MD  doxycycline (VIBRA-TABS) 100 MG tablet Take 1 tablet (100 mg total) by mouth 2 (two) times daily. 02/04/15  Yes Patrici Ranks, MD  furosemide (LASIX) 80 MG tablet Take 80 mg by mouth daily with breakfast.    Yes Historical Provider, MD  gabapentin (NEURONTIN) 300 MG capsule Take 3 capsules (900 mg total) by mouth 2 (two) times daily. Take 3 capsules in the morning, and  3 capsules at bedtime. 03/18/15  Yes Patrici Ranks, MD  magnesium oxide (MAG-OX) 400 MG tablet Take one tablet twice daily Patient taking differently: Take 400 mg by mouth 3 (three) times daily.  06/10/15  Yes Manon Hilding Kefalas, PA-C  potassium chloride (KLOR-CON) 20 MEQ packet Take 20 mEq by mouth daily. 04/18/15  Yes Patrici Ranks, MD  ramipril (ALTACE) 10 MG capsule Take 1 capsule (10 mg total) by mouth daily. 03/04/15  Yes Manon Hilding Kefalas, PA-C  senna (SENOKOT) 8.6 MG tablet Take 1 tablet by mouth 2 (two) times daily as needed for constipation.   Yes Historical Provider, MD  valsartan (DIOVAN) 320 MG tablet Take 320 mg by mouth daily.   Yes Historical Provider, MD  fluticasone (FLONASE) 50 MCG/ACT nasal spray 2 puffs in each nostril at bedtime. Patient taking differently: Place 2 sprays into both nostrils daily as needed for allergies. 2 puffs in each nostril at bedtime. 07/11/14   Farrel Gobble, MD  glipiZIDE (GLUCOTROL) 10 MG tablet Take 10 mg by mouth daily before breakfast.  08/28/13   Farrel Gobble, MD  lidocaine-prilocaine (EMLA) cream Apply a  quarter size amount to port site 1 hour prior to chemo. Do not rub in. Cover with plastic. 01/15/15   Patrici Ranks, MD  Linagliptin-Metformin HCl (JENTADUETO) 2.03-999 MG TABS Take 1 tablet by mouth 2 (two) times daily.     Historical Provider, MD  ondansetron (ZOFRAN) 8 MG tablet Take 1 tablet (8 mg total) by mouth every 8 (eight) hours as needed for nausea or vomiting. 12/28/14   Patrici Ranks, MD  oxyCODONE-acetaminophen (PERCOCET/ROXICET) 5-325 MG per tablet Take one to two tablets every 4 hours as needed for pain Patient not taking: Reported on 06/12/2015 05/27/15   Manon Hilding Kefalas, PA-C  polyethylene glycol powder (GLYCOLAX) powder Take in 8 oz fluid once daily, may increase to twice daily Patient taking differently: Take 1 Container by mouth daily as needed for mild constipation. Take in 8 oz fluid once daily, may increase to  twice daily 12/28/14   Patrici Ranks, MD   Physical Exam: Filed Vitals:   06/24/2015 1130 07/09/2015 1300 06/21/2015 1548 07/01/2015 1629  BP: 151/124 137/83 133/73 149/87  Pulse: 94 104 117   Temp:   97.9 F (36.6 C)   TempSrc:   Oral   Resp:   16   Height:      Weight:      SpO2: 99% 98% 93%     Wt Readings from Last 3 Encounters:  06/10/2015 109.77 kg (242 lb)  06/10/15 110.043 kg (242 lb 9.6 oz)  05/27/15 111.585 kg (246 lb)    General:  Appears calm and comfortable Eyes:  PERRL, normal lids, irises & conjunctiva ENT:  grossly normal hearing, lips & tongue Neck:  no LAD, R thyroid nodule Cardiovascular:  RRR, no m/r/g. traceLE edema. Telemetry:  SR, no arrhythmias  Respiratory:  CTA bilaterally, no w/r/r. Normal respiratory effort. Abdomen: Obese, hypoactive BS, minimal diffuse ttp Skin:  no rash or induration seen on limited exam Musculoskeletal:  grossly normal tone BUE/BLE Psychiatric:  grossly normal mood and affect, speech fluent and appropriate Neurologic:  grossly non-focal.          Labs on Admission:  Basic Metabolic Panel:  Recent  Labs Lab 06/14/2015 1116  NA 135  K 3.8  CL 99*  CO2 23  GLUCOSE 279*  BUN 12  CREATININE 0.60  CALCIUM 8.7*   Liver Function Tests:  Recent Labs Lab 06/25/2015 1116  AST 21  ALT 16  ALKPHOS 74  BILITOT 1.4*  PROT 7.0  ALBUMIN 3.7    Recent Labs Lab 06/21/2015 1116  LIPASE <10*   No results for input(s): AMMONIA in the last 168 hours. CBC:  Recent Labs Lab 06/11/2015 1116  WBC 7.6  NEUTROABS 4.4  HGB 15.5*  HCT 46.7*  MCV 80.0  PLT 164   Cardiac Enzymes: No results for input(s): CKTOTAL, CKMB, CKMBINDEX, TROPONINI in the last 168 hours.  BNP (last 3 results) No results for input(s): BNP in the last 8760 hours.  ProBNP (last 3 results) No results for input(s): PROBNP in the last 8760 hours.  CBG: No results for input(s): GLUCAP in the last 168 hours.  Radiological Exams on Admission: Ct Abdomen Pelvis W Contrast  07/10/2015   CLINICAL DATA:  59 year old female with lower abdominal pain and vomiting for 3 days. Current history of stage IV colon cancer currently undergoing chemotherapy. Subsequent encounter.  EXAM: CT ABDOMEN AND PELVIS WITH CONTRAST  TECHNIQUE: Multidetector CT imaging of the abdomen and pelvis was performed using the standard protocol following bolus administration of intravenous contrast.  CONTRAST:  21mL OMNIPAQUE IOHEXOL 300 MG/ML SOLN, 192mL OMNIPAQUE IOHEXOL 300 MG/ML SOLN  COMPARISON:  CT chest abdomen and pelvis 06/17/2015, and earlier  FINDINGS: Stable and negative lung bases. No pericardial or pleural effusion. 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  No acute or suspicious osseous lesion identified.  Increased volume of pelvic free fluid, still overall small. Left perirectal soft tissue nodularity re- demonstrated (series 2, image 79). Diminutive urinary bladder. Gas and stool in the distal colon. Decompressed left colon and transverse colon. Small to large bowel anastomosis in the right upper quadrant.  The small  bowel now is dilated throughout the abdomen and pelvis. Proximal and mid small bowel loops dilated up to 3.5 cm diameter. There is a transition point where the distal small bowel loops inter the right paracentral complex ventral abdominal hernia on series 2, image  51. Small bowel within the complex appearing hernia then is decompressed, and small bowel leaving the hernia and tracking toward the anastomosis also appears decompressed. Continued small volume of free fluid in the hernia sac. No definite fat stranding within the herniated mesenteric. There is mesenteric stranding and interloop fluid within the abdomen.  No pneumoperitoneum. Small volume of perihepatic free fluid is increased. No liver mass. Decompressed gallbladder with probable vicarious contrast excretion. Spleen and pancreas remain normal. Bilateral adrenal metastases are stable measuring 2.7-3.3 cm diameter. Portal venous system is patent. Aortoiliac calcified atherosclerosis noted. Major arterial structures in the abdomen and pelvis are patent. No lymphadenopathy identified. Renal enhancement and contrast excretion appears symmetric and within normal limits.  IMPRESSION: 1. Acute small bowel obstruction, and transition point appears to be the complex ventral abdominal hernia. Relatively high-grade of obstruction with mesenteric stranding and interloop fluid, but no perforation or free air. 2. Mildly increased ascites since the recent restaging study on 06/17/2015. 3. Adrenal metastases and perirectal peritoneal nodularity are stable.   Electronically Signed   By: Genevie Ann M.D.   On: 06/16/2015 14:42   Dg Abd Acute W/chest  07/06/2015   CLINICAL DATA:  Acute generalized abdominal pain.  EXAM: DG ABDOMEN ACUTE W/ 1V CHEST  COMPARISON:  April 30, 2015.  FINDINGS: There is no evidence of free intraperitoneal air. Mildly dilated small bowel loops are noted in the left and epigastric regions. No colonic dilatation is noted. No radiopaque calculi or other  significant radiographic abnormality is seen. Heart size and mediastinal contours are within normal limits. Both lungs are clear.  IMPRESSION: Mildly dilated small bowel loops are noted concerning for ileus or possibly distal small bowel obstruction. Continued radiographic follow-up is recommended.   Electronically Signed   By: Marijo Conception, M.D.   On: 06/20/2015 12:32      Assessment/Plan Principal Problem:   SBO (small bowel obstruction) Active Problems:   Type 2 diabetes mellitus   Essential hypertension   Adenocarcinoma, colon   Lower extremity edema   Hypomagnesemia   Peripheral neuropathy   Abdominal pain: Likely secondary to small bowel obstruction. CT showing dilated small bowel loops, with transition point at area of ventral abdominal hernia. No hernia incarceration noted. Patient with ongoing treatment for right colon cancer, and history of abdominal surgeries including, exploratory laparoscopy, abdominal hysterectomy, partial colectomy, all done in 2014. Patient has had an SBO 1 time before. - MedSurg - Insert NG tube if patient develops emesis. - Nothing by mouth - IVF - 1/2NS 144ml/hr - Dr. Arnoldo Morale of general surgery already consult did and appreciate his recommendations.  Cancer: Stage IV adenocarcinoma of the colon. Last chemotherapy on 06/10/2015 withVectibux. Status post hemicolectomy in 2014. Patient states next chemotherapy scheduled for 07/09/2015. - Oncology team consultation via Epic  Peripheral neuropathy: - Continue Neurontin  Hypertension: Normotensive - Continue ramipril, valsartan, carvedilol when able to take by mouth -Hydralazine when necessary SBP >180, DBP >100   Hypomag:  - Magnesium level - IV mag as necessary for repletion  Diabetes: - A1c - SSI  LE edema: at baseline. No echo.   - change Lasix to IV 40mg  Qam    Code Status: FULL DVT Prophylaxis: Hep Family Communication: None Disposition Plan: pending improvemetn  Johnesha Acheampong,  Mckenna Boruff Lenna Sciara, MD Family Medicine [facility] [email or web address] Password [email or web address]

## 2015-06-22 NOTE — ED Provider Notes (Signed)
CSN: [id number]     Arrival date & time 07/06/2015  6237 History   First MD Initiated Contact with Patient 06/18/2015 1012     Chief Complaint  Patient presents with  . Abdominal Pain     (Consider location/radiation/quality/duration/timing/severity/associated sxs/prior Treatment) The history is provided by the patient.   Monica Allen is a 59 y.o. female with a chronic history of diabetes, hypertension and known abdominal hernia who is currently being treated for colon ca last chemotherapy completed on August 1 presenting with a 3 day history of left lower abdominal pain along with nausea, anorexia and emesis 1 2 days ago.  She has had no oral intake other than sips of fluid for 48 hours.  She denies fevers or chills.  Pain is constant, it comes in waves but does not completely resolve.  Surgical history is significant for abdominal hysterectomy, laparotomy with right hemicolectomy in 2014.  She has found no alleviators for her symptoms.    Past Medical History  Diagnosis Date  . Diabetes mellitus   . Hypertension   . Enlarged heart   . Goiter   . Nonischemic cardiomyopathy   . Anemia   . Abdominal hernia   . 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 Wisdom tooth extraction    . Laparotomy  12/13/2012    Procedure: EXPLORATORY LAPAROTOMY;  Surgeon: Alvino Chapel, MD;  Location: [facility];  Service: Gynecology;  Laterality: N/A;  . Abdominal hysterectomy  12/13/2012    Procedure: HYSTERECTOMY ABDOMINAL;  Surgeon: Alvino Chapel, MD;  Location: [facility];  Service: Gynecology;  Laterality: N/A;  . Salpingoophorectomy  12/13/2012    Procedure: SALPINGO OOPHORECTOMY;  Surgeon: Alvino Chapel, MD;  Location: [facility];  Service: Gynecology;  Laterality: Bilateral;  . Omentectomy  12/13/2012    Procedure: OMENTECTOMY;  Surgeon: Alvino Chapel, MD;  Location: [facility];  Service: Gynecology;  Laterality: N/A;  . Colonoscopy N/A 02/14/2013    Procedure: COLONOSCOPY;  Surgeon: Danie Binder, MD;  Location: [facility];  Service: Endoscopy;  Laterality: N/A;  2:00 PM-moved to [place] notified pt  . Partial colectomy N/A 03/01/2013    Procedure: PARTIAL COLECTOMY;  Surgeon: Jamesetta So, MD;  Location: [facility];  Service: General;  Laterality: N/A;  . Portacath placement Left 03/01/2013    Procedure: INSERTION PORT-A-CATH;  Surgeon: Jamesetta So, MD;  Location: [facility];  Service: General;  Laterality: Left;   Family History  Problem Relation Age of Onset  . Diabetes Mother   . Hypertension Mother   . Diabetes Sister   . Hypertension Sister   . Hypertension Brother   . Stroke Maternal Aunt   . Diabetes Maternal Grandmother   . Hypertension Maternal Grandmother   . Colon cancer Neg Hx    Social History  Substance Use Topics  . Smoking status: Former Smoker -- 1.50 packs/day for 25 years    Types: Cigarettes    Quit date: 12/10/1987  . Smokeless tobacco: Never Used     Comment: quit 22 years ago  . Alcohol Use: No     Comment: last use x1 month.   OB History    No data available     Review of Systems  Constitutional: Positive for appetite change. Negative for fever and chills.  HENT:  Negative for congestion and sore throat.   Eyes: Negative.   Respiratory: Negative for chest tightness and shortness of breath.   Cardiovascular: Negative for chest pain.  Gastrointestinal: Positive for nausea, vomiting and abdominal pain. Negative for diarrhea, constipation, blood in stool and rectal pain.  Genitourinary: Negative.   Musculoskeletal: Negative for joint swelling, arthralgias and neck pain.  Skin: Negative.  Negative for rash and wound.  Neurological: Negative for dizziness, weakness, light-headedness, numbness and headaches.  Psychiatric/Behavioral: Negative.       Allergies  Actos and Ibuprofen  Home  Medications   Prior to Admission medications   Medication Sig Start Date End Date Taking? Authorizing Provider  aspirin EC 81 MG tablet Take 81 mg by mouth every morning.    Yes Historical Provider, MD  carvedilol (COREG CR) 80 MG 24 hr capsule Take 80 mg by mouth daily with breakfast.    Yes Historical Provider, MD  desonide (DESOWEN) 0.05 % lotion Apply to face and neck twice daily for rash Patient taking differently: Apply 1 application topically 2 (two) times daily. Apply to face and neck twice daily for rash 02/04/15  Yes Patrici Ranks, MD  doxycycline (VIBRA-TABS) 100 MG tablet Take 1 tablet (100 mg total) by mouth 2 (two) times daily. 02/04/15  Yes Patrici Ranks, MD  furosemide (LASIX) 80 MG tablet Take 80 mg by mouth daily with breakfast.    Yes Historical Provider, MD  gabapentin (NEURONTIN) 300 MG capsule Take 3 capsules (900 mg total) by mouth 2 (two) times daily. Take 3 capsules in the morning, and  3 capsules at bedtime. 03/18/15  Yes Patrici Ranks, MD  magnesium oxide (MAG-OX) 400 MG tablet Take one tablet twice daily Patient taking differently: Take 400 mg by mouth 3 (three) times daily.  06/10/15  Yes Manon Hilding Kefalas, PA-C  potassium chloride (KLOR-CON) 20 MEQ packet Take 20 mEq by mouth daily. 04/18/15  Yes Patrici Ranks, MD  ramipril (ALTACE) 10 MG capsule Take 1 capsule (10 mg total) by mouth daily. 03/04/15  Yes Manon Hilding Kefalas, PA-C  senna (SENOKOT) 8.6 MG tablet Take 1 tablet by mouth 2 (two) times daily as needed for constipation.   Yes Historical Provider, MD  valsartan (DIOVAN) 320 MG tablet Take 320 mg by mouth daily.   Yes Historical Provider, MD  fluticasone (FLONASE) 50 MCG/ACT nasal spray 2 puffs in each nostril at bedtime. Patient taking differently: Place 2 sprays into both nostrils daily as needed for allergies. 2 puffs in each nostril at bedtime. 07/11/14   Farrel Gobble, MD  glipiZIDE (GLUCOTROL) 10 MG tablet Take 10 mg by mouth daily before breakfast.   08/28/13   Farrel Gobble, MD  lidocaine-prilocaine (EMLA) cream Apply a quarter size amount to port site 1 hour prior to chemo. Do not rub in. Cover with plastic. 01/15/15   Patrici Ranks, MD  Linagliptin-Metformin HCl (JENTADUETO) 2.03-999 MG TABS Take 1 tablet by mouth 2 (two) times daily.     Historical Provider, MD  ondansetron (ZOFRAN) 8 MG tablet Take 1 tablet (8 mg total) by mouth every 8 (eight) hours as needed for nausea or vomiting. 12/28/14   Patrici Ranks, MD  oxyCODONE-acetaminophen (PERCOCET/ROXICET) 5-325 MG per tablet Take one to two tablets every 4 hours as needed for pain Patient not taking: Reported on 06/10/2015 05/27/15   Manon Hilding Kefalas, PA-C  polyethylene glycol powder (GLYCOLAX) powder Take in 8 oz fluid once daily, may increase to twice daily Patient  taking differently: Take 1 Container by mouth daily as needed for mild constipation. Take in 8 oz fluid once daily, may increase to twice daily 12/28/14   Patrici Ranks, MD   BP 137/83 mmHg  Pulse 104  Temp(Src) 97.7 F (36.5 C) (Oral)  Resp 16  Ht 5' 2.5" (1.588 m)  Wt 242 lb (109.77 kg)  BMI 43.53 kg/m2  SpO2 98%  LMP 10/07/2012 Physical Exam  Constitutional: She appears well-developed and well-nourished.  HENT:  Head: Normocephalic and atraumatic.  Eyes: Conjunctivae are normal.  Neck: Normal range of motion.  Cardiovascular: Normal rate, regular rhythm, normal heart sounds and intact distal pulses.   Pulmonary/Chest: Effort normal and breath sounds normal. She has no wheezes.  Abdominal: Soft. There is no hepatosplenomegaly. There is tenderness in the periumbilical area and left lower quadrant. There is rebound and guarding. There is no rigidity and no CVA tenderness.  Rebound in right upper quadrant.  Musculoskeletal: Normal range of motion.  Neurological: She is alert.  Skin: Skin is warm and dry.  Psychiatric: She has a normal mood and affect.  Nursing note and vitals reviewed.   ED Course   Procedures (including critical care time) Labs Review Labs Reviewed  LIPASE, BLOOD - Abnormal; Notable for the following:    Lipase <10 (*)    All other components within normal limits  COMPREHENSIVE METABOLIC PANEL - Abnormal; Notable for the following:    Chloride 99 (*)    Glucose, Bld 279 (*)    Calcium 8.7 (*)    Total Bilirubin 1.4 (*)    All other components within normal limits  CBC WITH DIFFERENTIAL/PLATELET - Abnormal; Notable for the following:    RBC 5.84 (*)    Hemoglobin 15.5 (*)    HCT 46.7 (*)    All other components within normal limits  URINALYSIS, ROUTINE W REFLEX MICROSCOPIC (NOT AT [facility]) - Abnormal; Notable for the following:    Color, Urine AMBER (*)    APPearance HAZY (*)    Specific Gravity, Urine >1.030 (*)    Glucose, UA 500 (*)    Bilirubin Urine MODERATE (*)    Ketones, ur >80 (*)    Protein, ur 100 (*)    All other components within normal limits  URINE MICROSCOPIC-ADD ON - Abnormal; Notable for the following:    Squamous Epithelial / LPF MANY (*)    Bacteria, UA MANY (*)    All other components within normal limits  URINE CULTURE    Imaging Review Ct Abdomen Pelvis W Contrast  06/29/2015   CLINICAL DATA:  59 year old female with lower abdominal pain and vomiting for 3 days. Current history of stage IV colon cancer currently undergoing chemotherapy. Subsequent encounter.  EXAM: CT ABDOMEN AND PELVIS WITH CONTRAST  TECHNIQUE: Multidetector CT imaging of the abdomen and pelvis was performed using the standard protocol following bolus administration of intravenous contrast.  CONTRAST:  65mL OMNIPAQUE IOHEXOL 300 MG/ML SOLN, 160mL OMNIPAQUE IOHEXOL 300 MG/ML SOLN  COMPARISON:  CT chest abdomen and pelvis 06/17/2015, and earlier  FINDINGS: Stable and negative lung bases. No pericardial or pleural effusion. 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  No acute or suspicious osseous lesion identified.  Increased volume of pelvic free  fluid, still overall small. Left perirectal soft tissue nodularity re- demonstrated (series 2, image 79). Diminutive urinary bladder. Gas and stool in the distal colon. Decompressed left colon and transverse colon. Small to large bowel anastomosis in the right upper quadrant.  The small bowel now is dilated throughout the abdomen and pelvis. Proximal and mid small bowel loops dilated up to 3.5 cm diameter. There is a transition point where the distal small bowel loops inter the right paracentral complex ventral abdominal hernia on series 2, image 51. Small bowel within the complex appearing hernia then is decompressed, and small bowel leaving the hernia and tracking toward the anastomosis also appears decompressed. Continued small volume of free fluid in the hernia sac. No definite fat stranding within the herniated mesenteric. There is mesenteric stranding and interloop fluid within the abdomen.  No pneumoperitoneum. Small volume of perihepatic free fluid is increased. No liver mass. Decompressed gallbladder with probable vicarious contrast excretion. Spleen and pancreas remain normal. Bilateral adrenal metastases are stable measuring 2.7-3.3 cm diameter. Portal venous system is patent. Aortoiliac calcified atherosclerosis noted. Major arterial structures in the abdomen and pelvis are patent. No lymphadenopathy identified. Renal enhancement and contrast excretion appears symmetric and within normal limits.  IMPRESSION: 1. Acute small bowel obstruction, and transition point appears to be the complex ventral abdominal hernia. Relatively high-grade of obstruction with mesenteric stranding and interloop fluid, but no perforation or free air. 2. Mildly increased ascites since the recent restaging study on 06/17/2015. 3. Adrenal metastases and perirectal peritoneal nodularity are stable.   Electronically Signed   By: Genevie Ann M.D.   On: 06/28/2015 14:42   Dg Abd Acute W/chest  06/11/2015   CLINICAL DATA:  Acute  generalized abdominal pain.  EXAM: DG ABDOMEN ACUTE W/ 1V CHEST  COMPARISON:  April 30, 2015.  FINDINGS: There is no evidence of free intraperitoneal air. Mildly dilated small bowel loops are noted in the left and epigastric regions. No colonic dilatation is noted. No radiopaque calculi or other significant radiographic abnormality is seen. Heart size and mediastinal contours are within normal limits. Both lungs are clear.  IMPRESSION: Mildly dilated small bowel loops are noted concerning for ileus or possibly distal small bowel obstruction. Continued radiographic follow-up is recommended.   Electronically Signed   By: Marijo Conception, M.D.   On: 07/01/2015 12:32    EKG Interpretation None      MDM   Final diagnoses:  SBO (small bowel obstruction)  Hernia of abdominal wall  Dehydration    Patients labs and/or radiological studies were reviewed and considered during the medical decision making and disposition process. Results were also discussed with patient.   Imaging was viewed, interpreted and I agree with radiologists reading.    Medications  ondansetron (ZOFRAN) injection 4 mg (4 mg Intramuscular Not Given 07/03/2015 1123)  sodium chloride 0.9 % bolus 1,000 mL (not administered)  morphine 4 MG/ML injection 8 mg (8 mg Intramuscular Given 07/01/2015 1123)  morphine 4 MG/ML injection 4 mg (4 mg Intravenous Given 07/04/2015 1310)  sodium chloride 0.9 % bolus 1,000 mL (1,000 mLs Intravenous New Bag/Given 07/07/2015 1313)  iohexol (OMNIPAQUE) 300 MG/ML solution 25 mL (25 mLs Oral Contrast Given 06/23/2015 1350)  iohexol (OMNIPAQUE) 300 MG/ML solution 100 mL (100 mLs Intravenous Contrast Given 06/13/2015 1351)  HYDROmorphone (DILAUDID) injection 1 mg (1 mg Intravenous Given 06/20/2015 1337)  HYDROmorphone (DILAUDID) injection 1 mg (1 mg Intravenous Given 06/24/2015 1522)    Pt given multiple doses of pain medicines for comfort.  NS IV x 2 L for rehydration.     Pt with acute sbo.  Discussed with Dr. Arnoldo Morale  who will see pt in ed. Per Dr. Arnoldo Morale will follow pt, hospital admission requested.   Almyra Free  Glori Machnik, PA-C 06/18/2015 [place], PA-C 06/21/2015 1659  Milton Ferguson, MD 06/23/15 [phone]

## 2015-06-22 NOTE — ED Notes (Signed)
Pt states lower abdominal pain x 3 days. Currently taking chemotherapy for colon CA. Next tx is due Monday. Pt vomited Thursday, no appetite, and stated "contraction pain" to lower abdomen.  LNBM was Thursday.

## 2015-06-23 DIAGNOSIS — K5669 Other intestinal obstruction: Secondary | ICD-10-CM

## 2015-06-23 DIAGNOSIS — C189 Malignant neoplasm of colon, unspecified: Secondary | ICD-10-CM

## 2015-06-23 LAB — CBC
HCT: 45 % (ref 36.0–46.0)
Hemoglobin: 15.1 g/dL — ABNORMAL HIGH (ref 12.0–15.0)
MCH: 27.2 pg (ref 26.0–34.0)
MCHC: 33.6 g/dL (ref 30.0–36.0)
MCV: 80.9 fL (ref 78.0–100.0)
PLATELETS: 186 10*3/uL (ref 150–400)
RBC: 5.56 MIL/uL — ABNORMAL HIGH (ref 3.87–5.11)
RDW: 13.3 % (ref 11.5–15.5)
WBC: 8.2 10*3/uL (ref 4.0–10.5)

## 2015-06-23 LAB — BASIC METABOLIC PANEL
Anion gap: 12 (ref 5–15)
BUN: 16 mg/dL (ref 6–20)
CALCIUM: 8.3 mg/dL — AB (ref 8.9–10.3)
CHLORIDE: 101 mmol/L (ref 101–111)
CO2: 24 mmol/L (ref 22–32)
CREATININE: 0.75 mg/dL (ref 0.44–1.00)
GFR calc Af Amer: >60 mL/min (ref >60)
GFR calc non Af Amer: >60 mL/min (ref >60)
Glucose, Bld: 284 mg/dL — ABNORMAL HIGH (ref 65–99)
POTASSIUM: 4.4 mmol/L (ref 3.5–5.1)
SODIUM: 137 mmol/L (ref 135–145)

## 2015-06-23 LAB — GLUCOSE, CAPILLARY
GLUCOSE-CAPILLARY: 263 mg/dL — AB (ref 65–99)
GLUCOSE-CAPILLARY: 244 mg/dL — AB (ref 65–99)
GLUCOSE-CAPILLARY: 240 mg/dL — AB (ref 65–99)
Glucose-Capillary: 264 mg/dL — ABNORMAL HIGH (ref 65–99)

## 2015-06-23 LAB — MAGNESIUM: MAGNESIUM: 1.4 mg/dL — AB (ref 1.7–2.4)

## 2015-06-23 MED ORDER — CHLORHEXIDINE GLUCONATE 4 % EX LIQD
1.0000 "application " | Freq: Once | CUTANEOUS | Status: AC
  Administered 2015-06-23: 1 via TOPICAL
  Filled 2015-06-23: qty 15

## 2015-06-23 MED ORDER — MAGNESIUM SULFATE 2 GM/50ML IV SOLN
2.0000 g | Freq: Once | INTRAVENOUS | Status: AC
  Administered 2015-06-23: 2 g via INTRAVENOUS
  Filled 2015-06-23: qty 50

## 2015-06-23 MED ORDER — CEFAZOLIN SODIUM-DEXTROSE 2-3 GM-% IV SOLR
2.0000 g | INTRAVENOUS | Status: AC
  Administered 2015-06-24: 2 g via INTRAVENOUS

## 2015-06-23 NOTE — Consult Note (Signed)
Reason for Consult: Nausea and vomiting Referring Physician: Hospitalist  Monica Allen is an 59 y.o. female.  HPI: Patient is a 60 year old black female who was admitted to the hospital for 24-hour history of worsening nausea and vomiting. CT scan of the abdomen revealed an incisional hernia with a partial small bowel traction. She is status post both a right hemicolectomy as well as TAH, BSO, omentectomy for advanced colon cancer. This was done in 2014. She has been receiving chemotherapy, her last dose was 06/10/2015. She did have a bowel movement this morning.  Past Medical History  Diagnosis Date  . Diabetes mellitus   . Hypertension   . Enlarged heart   . Goiter   . Nonischemic cardiomyopathy   . Anemia   . Abdominal hernia   . 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 Wisdom tooth extraction    . Laparotomy  12/13/2012    Procedure: EXPLORATORY LAPAROTOMY;  Surgeon: Alvino Chapel, MD;  Location: [facility];  Service: Gynecology;  Laterality: N/A;  . Abdominal hysterectomy  12/13/2012    Procedure: HYSTERECTOMY ABDOMINAL;  Surgeon: Alvino Chapel, MD;  Location: [facility];  Service: Gynecology;  Laterality: N/A;  . Salpingoophorectomy  12/13/2012    Procedure: SALPINGO OOPHORECTOMY;  Surgeon: Alvino Chapel, MD;  Location: [facility];  Service: Gynecology;  Laterality: Bilateral;  . Omentectomy  12/13/2012    Procedure: OMENTECTOMY;  Surgeon: Alvino Chapel, MD;  Location: [facility];  Service: Gynecology;  Laterality: N/A;  . Colonoscopy N/A 02/14/2013    Procedure: COLONOSCOPY;  Surgeon: Danie Binder, MD;  Location: [facility];  Service: Endoscopy;  Laterality: N/A;  2:00 PM-moved to [place] notified pt  . Partial colectomy N/A 03/01/2013    Procedure: PARTIAL COLECTOMY;  Surgeon: Jamesetta So, MD;  Location: [facility];   Service: General;  Laterality: N/A;  . Portacath placement Left 03/01/2013    Procedure: INSERTION PORT-A-CATH;  Surgeon: Jamesetta So, MD;  Location: [facility];  Service: General;  Laterality: Left;    Family History  Problem Relation Age of Onset  . Diabetes Mother   . Hypertension Mother   . Diabetes Sister   . Hypertension Sister   . Hypertension Brother   . Stroke Maternal Aunt   . Diabetes Maternal Grandmother   . Hypertension Maternal Grandmother   . Colon cancer Neg Hx     Social History:  reports that she quit smoking about 27 years ago. Her smoking use included Cigarettes. She has a 37.5 pack-year smoking history. She has never used smokeless tobacco. She reports that she does not drink alcohol or use illicit drugs.  Allergies:  Allergies  Allergen Reactions  . Actos [Pioglitazone] Other (See Comments)    Heart doctor told me not to take this drug.   . Ibuprofen Other (See Comments)    "made my stomach hurt"    Medications: I have reviewed the patient's current medications.  Results for orders placed or performed during the hospital encounter of 07/10/2015 (from the past 48 hour(s))  Lipase, blood     Status: Abnormal   Collection Time: 07/10/2015 11:16 AM  Result Value Ref Range   Lipase <10 (L) 22 - 51 U/L  Comprehensive metabolic panel     Status: Abnormal   Collection Time: 07/04/2015 11:16  AM  Result Value Ref Range   Sodium 135 135 - 145 mmol/L   Potassium 3.8 3.5 - 5.1 mmol/L   Chloride 99 (L) 101 - 111 mmol/L   CO2 23 22 - 32 mmol/L   Glucose, Bld 279 (H) 65 - 99 mg/dL   BUN 12 6 - 20 mg/dL   Creatinine, Ser 0.60 0.44 - 1.00 mg/dL   Calcium 8.7 (L) 8.9 - 10.3 mg/dL   Total Protein 7.0 6.5 - 8.1 g/dL   Albumin 3.7 3.5 - 5.0 g/dL   AST 21 15 - 41 U/L   ALT 16 14 - 54 U/L   Alkaline Phosphatase 74 38 - 126 U/L   Total Bilirubin 1.4 (H) 0.3 - 1.2 mg/dL   GFR calc non Af Amer >60 >60 mL/min   GFR calc Af Amer >60 >60 mL/min    Comment: (NOTE) The eGFR has  been calculated using the CKD EPI equation. This calculation has not been validated in all clinical situations. eGFR's persistently <60 mL/min signify possible Chronic Kidney Disease.    Anion gap 13 5 - 15  CBC with Differential     Status: Abnormal   Collection Time: 07/01/2015 11:16 AM  Result Value Ref Range   WBC 7.6 4.0 - 10.5 K/uL   RBC 5.84 (H) 3.87 - 5.11 MIL/uL   Hemoglobin 15.5 (H) 12.0 - 15.0 g/dL   HCT 46.7 (H) 36.0 - 46.0 %   MCV 80.0 78.0 - 100.0 fL   MCH 26.5 26.0 - 34.0 pg   MCHC 33.2 30.0 - 36.0 g/dL   RDW 13.2 11.5 - 15.5 %   Platelets 164 150 - 400 K/uL   Neutrophils Relative % 58 43 - 77 %   Neutro Abs 4.4 1.7 - 7.7 K/uL   Lymphocytes Relative 34 12 - 46 %   Lymphs Abs 2.6 0.7 - 4.0 K/uL   Monocytes Relative 7 3 - 12 %   Monocytes Absolute 0.5 0.1 - 1.0 K/uL   Eosinophils Relative 1 0 - 5 %   Eosinophils Absolute 0.1 0.0 - 0.7 K/uL   Basophils Relative 0 0 - 1 %   Basophils Absolute 0.0 0.0 - 0.1 K/uL  Urinalysis, Routine w reflex microscopic (not at [facility])     Status: Abnormal   Collection Time: 06/28/2015 12:17 PM  Result Value Ref Range   Color, Urine AMBER (A) YELLOW    Comment: BIOCHEMICALS MAY BE AFFECTED BY COLOR   APPearance HAZY (A) CLEAR   Specific Gravity, Urine >1.030 (H) 1.005 - 1.030   pH 6.0 5.0 - 8.0   Glucose, UA 500 (A) NEGATIVE mg/dL   Hgb urine dipstick NEGATIVE NEGATIVE   Bilirubin Urine MODERATE (A) NEGATIVE   Ketones, ur >80 (A) NEGATIVE mg/dL   Protein, ur 100 (A) NEGATIVE mg/dL   Urobilinogen, UA 1.0 0.0 - 1.0 mg/dL   Nitrite NEGATIVE NEGATIVE   Leukocytes, UA NEGATIVE NEGATIVE  Urine microscopic-add on     Status: Abnormal   Collection Time: 06/30/2015 12:17 PM  Result Value Ref Range   Squamous Epithelial / LPF MANY (A) RARE   WBC, UA 7-10 <3 WBC/hpf   RBC / HPF 0-2 <3 RBC/hpf   Bacteria, UA MANY (A) RARE  Glucose, capillary     Status: Abnormal   Collection Time: 06/27/2015 10:20 PM  Result Value Ref Range    Glucose-Capillary 276 (H) 65 - 99 mg/dL   Comment 1 Notify RN    Comment 2 Document in  Chart   CBC     Status: Abnormal   Collection Time: 06/23/15  5:02 AM  Result Value Ref Range   WBC 8.2 4.0 - 10.5 K/uL   RBC 5.56 (H) 3.87 - 5.11 MIL/uL   Hemoglobin 15.1 (H) 12.0 - 15.0 g/dL   HCT 45.0 36.0 - 46.0 %   MCV 80.9 78.0 - 100.0 fL   MCH 27.2 26.0 - 34.0 pg   MCHC 33.6 30.0 - 36.0 g/dL   RDW 13.3 11.5 - 15.5 %   Platelets 186 150 - 400 K/uL  Basic metabolic panel     Status: Abnormal   Collection Time: 06/23/15  5:02 AM  Result Value Ref Range   Sodium 137 135 - 145 mmol/L   Potassium 4.4 3.5 - 5.1 mmol/L   Chloride 101 101 - 111 mmol/L   CO2 24 22 - 32 mmol/L   Glucose, Bld 284 (H) 65 - 99 mg/dL   BUN 16 6 - 20 mg/dL   Creatinine, Ser 0.75 0.44 - 1.00 mg/dL   Calcium 8.3 (L) 8.9 - 10.3 mg/dL   GFR calc non Af Amer >60 >60 mL/min   GFR calc Af Amer >60 >60 mL/min    Comment: (NOTE) The eGFR has been calculated using the CKD EPI equation. This calculation has not been validated in all clinical situations. eGFR's persistently <60 mL/min signify possible Chronic Kidney Disease.    Anion gap 12 5 - 15  Glucose, capillary     Status: Abnormal   Collection Time: 06/23/15  7:28 AM  Result Value Ref Range   Glucose-Capillary 263 (H) 65 - 99 mg/dL   Comment 1 Notify RN     Ct Abdomen Pelvis W Contrast  07/09/2015   CLINICAL DATA:  59 year old female with lower abdominal pain and vomiting for 3 days. Current history of stage IV colon cancer currently undergoing chemotherapy. Subsequent encounter.  EXAM: CT ABDOMEN AND PELVIS WITH CONTRAST  TECHNIQUE: Multidetector CT imaging of the abdomen and pelvis was performed using the standard protocol following bolus administration of intravenous contrast.  CONTRAST:  76mL OMNIPAQUE IOHEXOL 300 MG/ML SOLN, 172mL OMNIPAQUE IOHEXOL 300 MG/ML SOLN  COMPARISON:  CT chest abdomen and pelvis 06/17/2015, and earlier  FINDINGS: Stable and negative lung  bases. No pericardial or pleural effusion. 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  No acute or suspicious osseous lesion identified.  Increased volume of pelvic free fluid, still overall small. Left perirectal soft tissue nodularity re- demonstrated (series 2, image 79). Diminutive urinary bladder. Gas and stool in the distal colon. Decompressed left colon and transverse colon. Small to large bowel anastomosis in the right upper quadrant.  The small bowel now is dilated throughout the abdomen and pelvis. Proximal and mid small bowel loops dilated up to 3.5 cm diameter. There is a transition point where the distal small bowel loops inter the right paracentral complex ventral abdominal hernia on series 2, image 51. Small bowel within the complex appearing hernia then is decompressed, and small bowel leaving the hernia and tracking toward the anastomosis also appears decompressed. Continued small volume of free fluid in the hernia sac. No definite fat stranding within the herniated mesenteric. There is mesenteric stranding and interloop fluid within the abdomen.  No pneumoperitoneum. Small volume of perihepatic free fluid is increased. No liver mass. Decompressed gallbladder with probable vicarious contrast excretion. Spleen and pancreas remain normal. Bilateral adrenal metastases are stable measuring 2.7-3.3 cm diameter. Portal venous system is patent. Aortoiliac  calcified atherosclerosis noted. Major arterial structures in the abdomen and pelvis are patent. No lymphadenopathy identified. Renal enhancement and contrast excretion appears symmetric and within normal limits.  IMPRESSION: 1. Acute small bowel obstruction, and transition point appears to be the complex ventral abdominal hernia. Relatively high-grade of obstruction with mesenteric stranding and interloop fluid, but no perforation or free air. 2. Mildly increased ascites since the recent restaging study on 06/17/2015. 3. Adrenal  metastases and perirectal peritoneal nodularity are stable.   Electronically Signed   By: Genevie Ann M.D.   On: 06/16/2015 14:42   Dg Abd Acute W/chest  06/25/2015   CLINICAL DATA:  Acute generalized abdominal pain.  EXAM: DG ABDOMEN ACUTE W/ 1V CHEST  COMPARISON:  April 30, 2015.  FINDINGS: There is no evidence of free intraperitoneal air. Mildly dilated small bowel loops are noted in the left and epigastric regions. No colonic dilatation is noted. No radiopaque calculi or other significant radiographic abnormality is seen. Heart size and mediastinal contours are within normal limits. Both lungs are clear.  IMPRESSION: Mildly dilated small bowel loops are noted concerning for ileus or possibly distal small bowel obstruction. Continued radiographic follow-up is recommended.   Electronically Signed   By: Marijo Conception, M.D.   On: 07/09/2015 12:32    ROS: See chart Blood pressure 139/77, pulse 109, temperature 97.5 F (36.4 C), temperature source Oral, resp. rate 16, height 5' 2.5" (1.588 m), weight 109.77 kg (242 lb), last menstrual period 10/07/2012, SpO2 99 %. Physical Exam: Pleasant black female in no acute distress. Lungs clear to auscultation with equal breath sounds bilaterally. Heart examination reveals a regular rate and rhythm without S3, S4, murmurs. Abdomen is soft with an obvious incisional hernia present at the umbilical level. It is partially reducible. No rigidity is noted.   Assessment/Plan: Impression: Incisional hernia causing partial small bowel obstruction. Currently receiving chemotherapy. Plan: Patient will need an incisional herniorrhaphy with mesh. This will be scheduled for tomorrow after discussion with oncology concerning the timing of surgery. She is also had a history of hypomagnesemia which will be addressed. The risks and benefits of the procedure including bleeding, infection, cardiopulmonary difficulties, and the possibility of recurrence of the hernia were fully  explained to the patient, who gave informed consent.  Monica Allen A 06/23/2015, 10:18 AM

## 2015-06-23 NOTE — Progress Notes (Signed)
[facility] PROGRESS NOTE  Monica Allen TIR:[id number] DOB: August 22, 1956 DOA: 06/10/2015 PCP: [name]  Assessment/Plan: Small bowel obstruction -Partial she had a bowel movement today. -CT shows dilated small bowel loops with transition point at area of ventral abdominal hernia. -Have requested consultation with surgery, Dr. Arnoldo Morale. He has seen patient today and is recommending hernia repair tomorrow. -We'll hold NG tube at present she is not very symptomatic.  Stage IV adenocarcinoma of the colon -Continue chemotherapy as scheduled with oncology.  Hypomagnesemia -Magnesium level is 1.4, will order IV repletion with mag sulfate.  Hypertension -Well-controlled, continue home medications.  Diabetes -Fair control, continue to adjust regimen as necessary.  Code Status: Full code Family Communication: Patient only, all questions answered  Disposition Plan: To be determined, likely OR in a.m.   Consultants:  Surgery, Dr. Arnoldo Morale   Antibiotics:  None   Subjective: Still complains of nausea and abdominal discomfort  Objective: Filed Vitals:   06/10/2015 1548 06/13/2015 1629 06/15/2015 2217 06/23/15 0631  BP: 133/73 149/87 134/86 139/77  Pulse: 117  125 109  Temp: 97.9 F (36.6 C)  97.9 F (36.6 C) 97.5 F (36.4 C)  TempSrc: Oral  Oral Oral  Resp: 16   16  Height:      Weight:      SpO2: 93%  100% 99%    Intake/Output Summary (Last 24 hours) at 06/23/15 1209 Last data filed at 06/12/2015 1856  Gross per 24 hour  Intake      0 ml  Output    250 ml  Net   -250 ml   Filed Weights   07/06/2015 0958  Weight: 109.77 kg (242 lb)    Exam:   General:  Alert, awake, oriented 3  Cardiovascular: Regular rate and rhythm, no murmurs, rubs or gallops  Respiratory: Clear to auscultation bilaterally  Abdomen: Soft, nontender, nondistended, positive bowel sounds, hernia located in the left para-central area  Extremities: No clubbing, cyanosis or  edema, positive pulses   Neurologic:  Grossly intact and nonfocal  Data Reviewed: Basic Metabolic Panel:  Recent Labs Lab 06/12/2015 1116 06/23/15 0502  NA 135 137  K 3.8 4.4  CL 99* 101  CO2 23 24  GLUCOSE 279* 284*  BUN 12 16  CREATININE 0.60 0.75  CALCIUM 8.7* 8.3*  MG  --  1.4*   Liver Function Tests:  Recent Labs Lab 06/27/2015 1116  AST 21  ALT 16  ALKPHOS 74  BILITOT 1.4*  PROT 7.0  ALBUMIN 3.7    Recent Labs Lab 07/10/2015 1116  LIPASE <10*   No results for input(s): AMMONIA in the last 168 hours. CBC:  Recent Labs Lab 06/25/2015 1116 06/23/15 0502  WBC 7.6 8.2  NEUTROABS 4.4  --   HGB 15.5* 15.1*  HCT 46.7* 45.0  MCV 80.0 80.9  PLT 164 186   Cardiac Enzymes: No results for input(s): CKTOTAL, CKMB, CKMBINDEX, TROPONINI in the last 168 hours. BNP (last 3 results) No results for input(s): BNP in the last 8760 hours.  ProBNP (last 3 results) No results for input(s): PROBNP in the last 8760 hours.  CBG:  Recent Labs Lab 06/28/2015 2220 06/23/15 0728 06/23/15 1159  GLUCAP 276* 263* 244*    No results found for this or any previous visit (from the past 240 hour(s)).   Studies: Ct Abdomen Pelvis W Contrast  06/11/2015   CLINICAL DATA:  59 year old female with lower abdominal pain and vomiting for 3 days. Current history of stage  IV colon cancer currently undergoing chemotherapy. Subsequent encounter.  EXAM: CT ABDOMEN AND PELVIS WITH CONTRAST  TECHNIQUE: Multidetector CT imaging of the abdomen and pelvis was performed using the standard protocol following bolus administration of intravenous contrast.  CONTRAST:  65mL OMNIPAQUE IOHEXOL 300 MG/ML SOLN, 172mL OMNIPAQUE IOHEXOL 300 MG/ML SOLN  COMPARISON:  CT chest abdomen and pelvis 06/17/2015, and earlier  FINDINGS: Stable and negative lung bases. No pericardial or pleural effusion. 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  No acute or suspicious osseous lesion identified.   Increased volume of pelvic free fluid, still overall small. Left perirectal soft tissue nodularity re- demonstrated (series 2, image 79). Diminutive urinary bladder. Gas and stool in the distal colon. Decompressed left colon and transverse colon. Small to large bowel anastomosis in the right upper quadrant.  The small bowel now is dilated throughout the abdomen and pelvis. Proximal and mid small bowel loops dilated up to 3.5 cm diameter. There is a transition point where the distal small bowel loops inter the right paracentral complex ventral abdominal hernia on series 2, image 51. Small bowel within the complex appearing hernia then is decompressed, and small bowel leaving the hernia and tracking toward the anastomosis also appears decompressed. Continued small volume of free fluid in the hernia sac. No definite fat stranding within the herniated mesenteric. There is mesenteric stranding and interloop fluid within the abdomen.  No pneumoperitoneum. Small volume of perihepatic free fluid is increased. No liver mass. Decompressed gallbladder with probable vicarious contrast excretion. Spleen and pancreas remain normal. Bilateral adrenal metastases are stable measuring 2.7-3.3 cm diameter. Portal venous system is patent. Aortoiliac calcified atherosclerosis noted. Major arterial structures in the abdomen and pelvis are patent. No lymphadenopathy identified. Renal enhancement and contrast excretion appears symmetric and within normal limits.  IMPRESSION: 1. Acute small bowel obstruction, and transition point appears to be the complex ventral abdominal hernia. Relatively high-grade of obstruction with mesenteric stranding and interloop fluid, but no perforation or free air. 2. Mildly increased ascites since the recent restaging study on 06/17/2015. 3. Adrenal metastases and perirectal peritoneal nodularity are stable.   Electronically Signed   By: Genevie Ann M.D.   On: 06/15/2015 14:42   Dg Abd Acute W/chest  06/20/2015    CLINICAL DATA:  Acute generalized abdominal pain.  EXAM: DG ABDOMEN ACUTE W/ 1V CHEST  COMPARISON:  April 30, 2015.  FINDINGS: There is no evidence of free intraperitoneal air. Mildly dilated small bowel loops are noted in the left and epigastric regions. No colonic dilatation is noted. No radiopaque calculi or other significant radiographic abnormality is seen. Heart size and mediastinal contours are within normal limits. Both lungs are clear.  IMPRESSION: Mildly dilated small bowel loops are noted concerning for ileus or possibly distal small bowel obstruction. Continued radiographic follow-up is recommended.   Electronically Signed   By: Marijo Conception, M.D.   On: 06/13/2015 12:32    Scheduled Meds: . gabapentin  900 mg Oral BID  . heparin  5,000 Units Subcutaneous 3 times per day  . insulin aspart  0-15 Units Subcutaneous TID WC  . insulin aspart  0-5 Units Subcutaneous QHS  . ondansetron  4 mg Intramuscular Once  . potassium chloride  20 mEq Oral Daily   Continuous Infusions: . sodium chloride 100 mL/hr at 06/23/15 8127    Principal Problem:   SBO (small bowel obstruction) Active Problems:   Type 2 diabetes mellitus   Essential hypertension   Adenocarcinoma, colon  Lower extremity edema   Hypomagnesemia   Peripheral neuropathy    Time spent: 35 minutes. Greater than 50% of this time was spent in direct contact with the patient coordinating care.    Lelon Frohlich  [facility] Pager [phone]  If 7PM-7AM, please contact night-coverage at [email or web address], password [facility] 06/23/2015, 12:09 PM  LOS: 1 day

## 2015-06-24 ENCOUNTER — Inpatient Hospital Stay (HOSPITAL_COMMUNITY): Payer: BLUE CROSS/BLUE SHIELD

## 2015-06-24 ENCOUNTER — Ambulatory Visit (HOSPITAL_COMMUNITY): Payer: BLUE CROSS/BLUE SHIELD | Admitting: Hematology & Oncology

## 2015-06-24 ENCOUNTER — Encounter (HOSPITAL_COMMUNITY): Payer: Self-pay | Admitting: *Deleted

## 2015-06-24 ENCOUNTER — Encounter (HOSPITAL_COMMUNITY): Admission: EM | Disposition: E | Payer: Self-pay | Source: Home / Self Care | Attending: Internal Medicine

## 2015-06-24 ENCOUNTER — Inpatient Hospital Stay (HOSPITAL_COMMUNITY): Payer: BLUE CROSS/BLUE SHIELD | Admitting: Anesthesiology

## 2015-06-24 HISTORY — PX: INCISIONAL HERNIA REPAIR: SHX193

## 2015-06-24 HISTORY — PX: INSERTION OF MESH: SHX5868

## 2015-06-24 LAB — BASIC METABOLIC PANEL
Anion gap: 11 (ref 5–15)
BUN: 20 mg/dL (ref 6–20)
CALCIUM: 8.2 mg/dL — AB (ref 8.9–10.3)
CHLORIDE: 96 mmol/L — AB (ref 101–111)
CO2: 27 mmol/L (ref 22–32)
CREATININE: 0.74 mg/dL (ref 0.44–1.00)
Glucose, Bld: 263 mg/dL — ABNORMAL HIGH (ref 65–99)
Potassium: 4.1 mmol/L (ref 3.5–5.1)
SODIUM: 134 mmol/L — AB (ref 135–145)

## 2015-06-24 LAB — URINE CULTURE: Special Requests: NORMAL

## 2015-06-24 LAB — GLUCOSE, CAPILLARY
GLUCOSE-CAPILLARY: 213 mg/dL — AB (ref 65–99)
GLUCOSE-CAPILLARY: 200 mg/dL — AB (ref 65–99)
GLUCOSE-CAPILLARY: 161 mg/dL — AB (ref 65–99)
Glucose-Capillary: 248 mg/dL — ABNORMAL HIGH (ref 65–99)
Glucose-Capillary: 193 mg/dL — ABNORMAL HIGH (ref 65–99)

## 2015-06-24 LAB — SURGICAL PCR SCREEN
MRSA, PCR: NEGATIVE
STAPHYLOCOCCUS AUREUS: NEGATIVE

## 2015-06-24 LAB — CBC
HCT: 41.5 % (ref 36.0–46.0)
Hemoglobin: 13.7 g/dL (ref 12.0–15.0)
MCH: 26.6 pg (ref 26.0–34.0)
MCHC: 33 g/dL (ref 30.0–36.0)
MCV: 80.6 fL (ref 78.0–100.0)
PLATELETS: 171 10*3/uL (ref 150–400)
RBC: 5.15 MIL/uL — AB (ref 3.87–5.11)
RDW: 13.2 % (ref 11.5–15.5)
WBC: 5.7 10*3/uL (ref 4.0–10.5)

## 2015-06-24 LAB — MAGNESIUM: MAGNESIUM: 1.6 mg/dL — AB (ref 1.7–2.4)

## 2015-06-24 SURGERY — REPAIR, HERNIA, INCISIONAL
Anesthesia: General | Site: Abdomen

## 2015-06-24 MED ORDER — ONDANSETRON HCL 4 MG/2ML IJ SOLN
INTRAMUSCULAR | Status: AC
  Filled 2015-06-24: qty 2

## 2015-06-24 MED ORDER — LINAGLIPTIN-METFORMIN HCL 2.5-1000 MG PO TABS: 1.0000 | ORAL_TABLET | Freq: Two times a day (BID) | ORAL | Status: DC

## 2015-06-24 MED ORDER — FENTANYL CITRATE (PF) 100 MCG/2ML IJ SOLN
INTRAMUSCULAR | Status: AC
  Filled 2015-06-24: qty 4

## 2015-06-24 MED ORDER — HYDROMORPHONE HCL 1 MG/ML IJ SOLN
1.0000 mg | INTRAMUSCULAR | Status: DC | PRN
  Administered 2015-06-24 – 2015-06-25 (×8): 1 mg via INTRAVENOUS
  Filled 2015-06-24 (×8): qty 1

## 2015-06-24 MED ORDER — NEOSTIGMINE METHYLSULFATE 10 MG/10ML IV SOLN
INTRAVENOUS | Status: DC | PRN
  Administered 2015-06-24: 4 mg via INTRAVENOUS

## 2015-06-24 MED ORDER — METFORMIN HCL 500 MG PO TABS: 1000.0000 mg | ORAL_TABLET | Freq: Two times a day (BID) | ORAL | Status: DC

## 2015-06-24 MED ORDER — POVIDONE-IODINE 10 % EX OINT
TOPICAL_OINTMENT | CUTANEOUS | Status: AC
  Filled 2015-06-24: qty 2

## 2015-06-24 MED ORDER — MIDAZOLAM HCL 2 MG/2ML IJ SOLN
1.0000 mg | INTRAMUSCULAR | Status: DC | PRN
  Administered 2015-06-24: 2 mg via INTRAVENOUS

## 2015-06-24 MED ORDER — PHENYLEPHRINE 40 MCG/ML (10ML) SYRINGE FOR IV PUSH (FOR BLOOD PRESSURE SUPPORT)
PREFILLED_SYRINGE | INTRAVENOUS | Status: AC
  Filled 2015-06-24: qty 10

## 2015-06-24 MED ORDER — FENTANYL CITRATE (PF) 100 MCG/2ML IJ SOLN
INTRAMUSCULAR | Status: DC | PRN
  Administered 2015-06-24: 50 ug via INTRAVENOUS
  Administered 2015-06-24: 25 ug via INTRAVENOUS
  Administered 2015-06-24: 50 ug via INTRAVENOUS
  Administered 2015-06-24 (×3): 25 ug via INTRAVENOUS

## 2015-06-24 MED ORDER — CARVEDILOL PHOSPHATE ER 80 MG PO CP24
80.0000 mg | ORAL_CAPSULE | Freq: Every day | ORAL | Status: DC
  Filled 2015-06-24 (×2): qty 1

## 2015-06-24 MED ORDER — GLYCOPYRROLATE 0.2 MG/ML IJ SOLN
INTRAMUSCULAR | Status: AC
  Filled 2015-06-24: qty 3

## 2015-06-24 MED ORDER — HYDROCORTISONE 1 % EX CREA
TOPICAL_CREAM | Freq: Two times a day (BID) | CUTANEOUS | Status: DC
  Administered 2015-06-24 – 2015-06-25 (×2): 1 via TOPICAL
  Filled 2015-06-24 (×3): qty 28
  Filled 2015-06-24: qty 1.5
  Filled 2015-06-24 (×11): qty 28
  Filled 2015-06-24: qty 1.5
  Filled 2015-06-24 (×2): qty 28

## 2015-06-24 MED ORDER — PANTOPRAZOLE SODIUM 40 MG PO TBEC
40.0000 mg | DELAYED_RELEASE_TABLET | Freq: Every day | ORAL | Status: DC
  Administered 2015-06-25: 40 mg via ORAL
  Filled 2015-06-24: qty 1

## 2015-06-24 MED ORDER — ACETAMINOPHEN 325 MG PO TABS: 650.0000 mg | ORAL_TABLET | Freq: Four times a day (QID) | ORAL | Status: DC | PRN

## 2015-06-24 MED ORDER — SUCCINYLCHOLINE CHLORIDE 20 MG/ML IJ SOLN
INTRAMUSCULAR | Status: DC | PRN
  Administered 2015-06-24: 140 mg via INTRAVENOUS

## 2015-06-24 MED ORDER — POVIDONE-IODINE 10 % OINT PACKET
TOPICAL_OINTMENT | CUTANEOUS | Status: DC | PRN
  Administered 2015-06-24: 2 via TOPICAL

## 2015-06-24 MED ORDER — RAMIPRIL 5 MG PO CAPS
10.0000 mg | ORAL_CAPSULE | Freq: Every day | ORAL | Status: DC
  Administered 2015-06-24 – 2015-06-25 (×2): 10 mg via ORAL
  Filled 2015-06-24 (×2): qty 2

## 2015-06-24 MED ORDER — ONDANSETRON 4 MG PO TBDP
4.0000 mg | ORAL_TABLET | Freq: Four times a day (QID) | ORAL | Status: DC | PRN
  Filled 2015-06-24: qty 1

## 2015-06-24 MED ORDER — FENTANYL CITRATE (PF) 100 MCG/2ML IJ SOLN
INTRAMUSCULAR | Status: AC
  Filled 2015-06-24: qty 2

## 2015-06-24 MED ORDER — DIPHENHYDRAMINE HCL 12.5 MG/5ML PO ELIX: 12.5000 mg | ORAL_SOLUTION | Freq: Four times a day (QID) | ORAL | Status: DC | PRN

## 2015-06-24 MED ORDER — ONDANSETRON HCL 4 MG/2ML IJ SOLN
4.0000 mg | Freq: Once | INTRAMUSCULAR | Status: AC
  Administered 2015-06-24: 4 mg via INTRAVENOUS

## 2015-06-24 MED ORDER — FENTANYL CITRATE (PF) 100 MCG/2ML IJ SOLN
25.0000 ug | INTRAMUSCULAR | Status: AC
  Administered 2015-06-24 (×2): 25 ug via INTRAVENOUS

## 2015-06-24 MED ORDER — LACTATED RINGERS IV SOLN
INTRAVENOUS | Status: DC
  Administered 2015-06-24 (×2): via INTRAVENOUS

## 2015-06-24 MED ORDER — GLYCOPYRROLATE 0.2 MG/ML IJ SOLN
INTRAMUSCULAR | Status: DC | PRN
  Administered 2015-06-24: 0.6 mg via INTRAVENOUS

## 2015-06-24 MED ORDER — PHENYLEPHRINE HCL 10 MG/ML IJ SOLN
INTRAMUSCULAR | Status: DC | PRN
  Administered 2015-06-24: 80 ug via INTRAVENOUS

## 2015-06-24 MED ORDER — FENTANYL CITRATE (PF) 100 MCG/2ML IJ SOLN
25.0000 ug | INTRAMUSCULAR | Status: DC | PRN
  Administered 2015-06-24 (×2): 50 ug via INTRAVENOUS

## 2015-06-24 MED ORDER — LIDOCAINE HCL (CARDIAC) 10 MG/ML IV SOLN
INTRAVENOUS | Status: DC | PRN
  Administered 2015-06-24: 50 mg via INTRAVENOUS

## 2015-06-24 MED ORDER — PROPOFOL 10 MG/ML IV BOLUS
INTRAVENOUS | Status: DC | PRN
  Administered 2015-06-24: 150 mg via INTRAVENOUS

## 2015-06-24 MED ORDER — ACETAMINOPHEN 650 MG RE SUPP: 650.0000 mg | Freq: Four times a day (QID) | RECTAL | Status: DC | PRN

## 2015-06-24 MED ORDER — ONDANSETRON HCL 4 MG/2ML IJ SOLN
4.0000 mg | Freq: Four times a day (QID) | INTRAMUSCULAR | Status: DC | PRN
  Administered 2015-06-24 – 2015-06-25 (×3): 4 mg via INTRAVENOUS
  Filled 2015-06-24 (×2): qty 2

## 2015-06-24 MED ORDER — FUROSEMIDE 80 MG PO TABS: 80.0000 mg | ORAL_TABLET | Freq: Every day | ORAL | Status: DC

## 2015-06-24 MED ORDER — SENNA 8.6 MG PO TABS: 1.0000 | ORAL_TABLET | Freq: Two times a day (BID) | ORAL | Status: DC | PRN

## 2015-06-24 MED ORDER — ROCURONIUM BROMIDE 100 MG/10ML IV SOLN
INTRAVENOUS | Status: DC | PRN
  Administered 2015-06-24 (×2): 5 mg via INTRAVENOUS
  Administered 2015-06-24: 10 mg via INTRAVENOUS
  Administered 2015-06-24: 25 mg via INTRAVENOUS

## 2015-06-24 MED ORDER — GLYCOPYRROLATE 0.2 MG/ML IJ SOLN
INTRAMUSCULAR | Status: AC
  Filled 2015-06-24: qty 1

## 2015-06-24 MED ORDER — 0.9 % SODIUM CHLORIDE (POUR BTL) OPTIME
TOPICAL | Status: DC | PRN
  Administered 2015-06-24: 1000 mL

## 2015-06-24 MED ORDER — NEOSTIGMINE METHYLSULFATE 10 MG/10ML IV SOLN
INTRAVENOUS | Status: AC
  Filled 2015-06-24: qty 1

## 2015-06-24 MED ORDER — CEFAZOLIN SODIUM-DEXTROSE 2-3 GM-% IV SOLR
2.0000 g | Freq: Three times a day (TID) | INTRAVENOUS | Status: AC
  Administered 2015-06-24 – 2015-06-25 (×2): 2 g via INTRAVENOUS
  Filled 2015-06-24 (×2): qty 50

## 2015-06-24 MED ORDER — DIPHENHYDRAMINE HCL 50 MG/ML IJ SOLN: 12.5000 mg | Freq: Four times a day (QID) | INTRAMUSCULAR | Status: DC | PRN

## 2015-06-24 MED ORDER — BUPIVACAINE LIPOSOME 1.3 % IJ SUSP
INTRAMUSCULAR | Status: DC | PRN
  Administered 2015-06-24: 20 mL

## 2015-06-24 MED ORDER — PROPOFOL 10 MG/ML IV BOLUS
INTRAVENOUS | Status: AC
  Filled 2015-06-24: qty 20

## 2015-06-24 MED ORDER — LORAZEPAM 2 MG/ML IJ SOLN: 1.0000 mg | INTRAMUSCULAR | Status: DC | PRN

## 2015-06-24 MED ORDER — SUCCINYLCHOLINE CHLORIDE 20 MG/ML IJ SOLN
INTRAMUSCULAR | Status: AC
  Filled 2015-06-24: qty 1

## 2015-06-24 MED ORDER — BUPIVACAINE LIPOSOME 1.3 % IJ SUSP
INTRAMUSCULAR | Status: AC
  Filled 2015-06-24: qty 20

## 2015-06-24 MED ORDER — ROCURONIUM BROMIDE 50 MG/5ML IV SOLN
INTRAVENOUS | Status: AC
  Filled 2015-06-24: qty 1

## 2015-06-24 MED ORDER — METOPROLOL TARTRATE 1 MG/ML IV SOLN
INTRAVENOUS | Status: DC | PRN
  Administered 2015-06-24 (×2): .5 mg via INTRAVENOUS

## 2015-06-24 MED ORDER — LIDOCAINE HCL (PF) 1 % IJ SOLN
INTRAMUSCULAR | Status: AC
  Filled 2015-06-24: qty 5

## 2015-06-24 MED ORDER — METOPROLOL TARTRATE 1 MG/ML IV SOLN
INTRAVENOUS | Status: AC
  Filled 2015-06-24: qty 10

## 2015-06-24 MED ORDER — LINAGLIPTIN 5 MG PO TABS
2.5000 mg | ORAL_TABLET | Freq: Two times a day (BID) | ORAL | Status: DC
  Administered 2015-06-25 (×2): 2.5 mg via ORAL
  Filled 2015-06-24 (×2): qty 1

## 2015-06-24 MED ORDER — ONDANSETRON HCL 4 MG/2ML IJ SOLN: 4.0000 mg | Freq: Once | INTRAMUSCULAR | Status: DC | PRN

## 2015-06-24 MED ORDER — MIDAZOLAM HCL 2 MG/2ML IJ SOLN
INTRAMUSCULAR | Status: AC
  Filled 2015-06-24: qty 2

## 2015-06-24 MED ORDER — ENOXAPARIN SODIUM 60 MG/0.6ML ~~LOC~~ SOLN
50.0000 mg | SUBCUTANEOUS | Status: DC
Start: 2015-06-25 — End: 2015-06-26
  Administered 2015-06-25: 50 mg via SUBCUTANEOUS
  Filled 2015-06-24: qty 0.6

## 2015-06-24 MED ORDER — LACTATED RINGERS IV SOLN
INTRAVENOUS | Status: DC
  Administered 2015-06-24: 17:00:00 via INTRAVENOUS

## 2015-06-24 MED ORDER — IRBESARTAN 300 MG PO TABS
300.0000 mg | ORAL_TABLET | Freq: Every day | ORAL | Status: DC
  Administered 2015-06-24 – 2015-06-25 (×2): 300 mg via ORAL
  Filled 2015-06-24 (×2): qty 1

## 2015-06-24 MED ORDER — ARTIFICIAL TEARS OP OINT
TOPICAL_OINTMENT | OPHTHALMIC | Status: AC
  Filled 2015-06-24: qty 3.5

## 2015-06-24 MED ORDER — OXYCODONE-ACETAMINOPHEN 5-325 MG PO TABS
1.0000 | ORAL_TABLET | ORAL | Status: DC | PRN
  Administered 2015-06-25 (×3): 2 via ORAL
  Filled 2015-06-24 (×3): qty 2

## 2015-06-24 SURGICAL SUPPLY — 52 items
BAG HAMPER (MISCELLANEOUS) ×3 IMPLANT
BLADE SURG SZ11 CARB STEEL (BLADE) ×3 IMPLANT
CHLORAPREP W/TINT 26ML (MISCELLANEOUS) ×3 IMPLANT
CLOTH BEACON ORANGE TIMEOUT ST (SAFETY) ×3 IMPLANT
COVER LIGHT HANDLE STERIS (MISCELLANEOUS) ×6 IMPLANT
DECANTER SPIKE VIAL GLASS SM (MISCELLANEOUS) IMPLANT
ELECT REM PT RETURN 9FT ADLT (ELECTROSURGICAL) ×3
ELECTRODE REM PT RTRN 9FT ADLT (ELECTROSURGICAL) ×1 IMPLANT
EVACUATOR DRAINAGE 10X20 100CC (DRAIN) IMPLANT
EVACUATOR SILICONE 100CC (DRAIN) ×3
FORMALIN 10 PREFIL 480ML (MISCELLANEOUS) ×3 IMPLANT
GAUZE SPONGE 4X4 12PLY STRL (GAUZE/BANDAGES/DRESSINGS) ×3 IMPLANT
GLOVE BIO SURGEON STRL SZ 6.5 (GLOVE) ×1 IMPLANT
GLOVE BIO SURGEONS STRL SZ 6.5 (GLOVE) ×1
GLOVE BIOGEL PI IND STRL 7.0 (GLOVE) IMPLANT
GLOVE BIOGEL PI IND STRL 7.5 (GLOVE) IMPLANT
GLOVE BIOGEL PI INDICATOR 7.0 (GLOVE) ×4
GLOVE BIOGEL PI INDICATOR 7.5 (GLOVE) ×2
GLOVE ECLIPSE 6.5 STRL STRAW (GLOVE) ×2 IMPLANT
GLOVE SURG SS PI 7.5 STRL IVOR (GLOVE) ×3 IMPLANT
GOWN STRL REUS W/ TWL XL LVL3 (GOWN DISPOSABLE) ×1 IMPLANT
GOWN STRL REUS W/TWL LRG LVL3 (GOWN DISPOSABLE) ×6 IMPLANT
GOWN STRL REUS W/TWL XL LVL3 (GOWN DISPOSABLE) ×3
INST SET MAJOR GENERAL (KITS) ×3 IMPLANT
KIT ROOM TURNOVER APOR (KITS) ×3 IMPLANT
MANIFOLD NEPTUNE II (INSTRUMENTS) ×3 IMPLANT
MESH VENTRALIGHT ST 4X6IN (Mesh General) ×2 IMPLANT
NDL HYPO 21X1.5 SAFETY (NEEDLE) ×1 IMPLANT
NEEDLE HYPO 21X1.5 SAFETY (NEEDLE) ×3 IMPLANT
NS IRRIG 1000ML POUR BTL (IV SOLUTION) ×3 IMPLANT
PACK ABDOMINAL MAJOR (CUSTOM PROCEDURE TRAY) ×3 IMPLANT
PAD ARMBOARD 7.5X6 YLW CONV (MISCELLANEOUS) ×3 IMPLANT
SET BASIN LINEN APH (SET/KITS/TRAYS/PACK) ×3 IMPLANT
SPONGE GAUZE 4X4 12PLY (GAUZE/BANDAGES/DRESSINGS) ×1 IMPLANT
STAPLER VISISTAT (STAPLE) ×2 IMPLANT
SUCTION POOLE TIP (SUCTIONS) ×2 IMPLANT
SUT ETHIBOND NAB MO 7 #0 18IN (SUTURE) IMPLANT
SUT ETHILON 3 0 FSL (SUTURE) ×2 IMPLANT
SUT NOVA NAB GS-21 1 T12 (SUTURE) IMPLANT
SUT NOVA NAB GS-22 2 2-0 T-19 (SUTURE) IMPLANT
SUT NOVA NAB GS-26 0 60 (SUTURE) ×4 IMPLANT
SUT PROLENE 0 CT 1 CR/8 (SUTURE) ×2 IMPLANT
SUT SILK 2 0 (SUTURE)
SUT SILK 2-0 18XBRD TIE 12 (SUTURE) IMPLANT
SUT SILK 3 0 SH CR/8 (SUTURE) ×2 IMPLANT
SUT VIC AB 2-0 CT1 27 (SUTURE) ×3
SUT VIC AB 2-0 CT1 TAPERPNT 27 (SUTURE) ×1 IMPLANT
SUT VIC AB 3-0 SH 27 (SUTURE)
SUT VIC AB 3-0 SH 27X BRD (SUTURE) ×1 IMPLANT
SUT VIC AB 4-0 PS2 27 (SUTURE) IMPLANT
SYR 20CC LL (SYRINGE) ×3 IMPLANT
TAPE CLOTH SURG 4X10 WHT LF (GAUZE/BANDAGES/DRESSINGS) ×2 IMPLANT

## 2015-06-24 NOTE — Anesthesia Postprocedure Evaluation (Signed)
  Anesthesia Post-op Note  Patient: Monica Allen  Procedure(s) Performed: Procedure(s): INCISIONAL HERNIORRHAPHY WITH MESH (N/A) INSERTION OF MESH (N/A)  Patient Location: PACU  Anesthesia Type:General  Level of Consciousness: awake, alert , oriented and patient cooperative  Airway and Oxygen Therapy: Patient Spontanous Breathing and Patient connected to face mask oxygen  Post-op Pain: none  Post-op Assessment: Post-op Vital signs reviewed, Patient's Cardiovascular Status Stable, Respiratory Function Stable, Patent Airway, No signs of Nausea or vomiting and Pain level controlled              Post-op Vital Signs: Reviewed and stable  Last Vitals:  Filed Vitals:   06/25/2015 1415  BP: 146/90  Pulse: 94  Temp:   Resp: 20    Complications: No apparent anesthesia complications

## 2015-06-24 NOTE — Progress Notes (Signed)
[facility] PROGRESS NOTE  Monica Allen YIA:[id number] DOB: 04-05-1956 DOA: 06/20/2015 PCP: [name]  Assessment/Plan: Small bowel obstruction -CT shows dilated small bowel loops with transition point at area of ventral abdominal hernia. -Evaluated by Dr Arnoldo Morale, General Surgery, who recommends hernia repair surgery that will be done today.  Stage IV adenocarcinoma of the colon -Continue chemotherapy as scheduled with oncology.  Hypomagnesemia -Magnesium level is 1.6,  -Continue supplementation once returns from OR.  Hypertension -Well-controlled, continue home medications.  Diabetes -Fair control, continue to adjust regimen as necessary.  Code Status: Full code DVT prophylaxis: Heparin  Family Communication: Patient only, all questions answered  Disposition Plan: To be determined after surgery.   Consultants:  General Surgery, Dr. Arnoldo Morale  Antibiotics:  None   Subjective: Seen briefly prior to surgery today. Anxious about upcoming surgery.  Objective: Filed Vitals:   06/12/2015 2217 06/23/15 0631 06/23/15 1404 06/23/15 2142  BP: 134/86 139/77 132/84 137/80  Pulse: 125 109 110 109  Temp: 97.9 F (36.6 C) 97.5 F (36.4 C) 98.1 F (36.7 C) 98.3 F (36.8 C)  TempSrc: Oral Oral  Axillary  Resp:  16 18 20   Height:      Weight:      SpO2: 100% 99% 98% 99%    Intake/Output Summary (Last 24 hours) at 07/01/2015 0706 Last data filed at 06/23/15 2300  Gross per 24 hour  Intake    240 ml  Output    200 ml  Net     40 ml   Filed Weights   06/13/2015 0958  Weight: 109.77 kg (242 lb)    Exam:   General:  AA Ox3, had some nausea overnight  Cardiovascular: deferred  Respiratory: deferred  Abdomen: deferred  Extremities: deferred  Neurologic:  deferred  Data Reviewed: Basic Metabolic Panel:  Recent Labs Lab 06/21/2015 1116 06/23/15 0502  NA 135 137  K 3.8 4.4  CL 99* 101  CO2 23 24  GLUCOSE 279* 284*  BUN 12 16    CREATININE 0.60 0.75  CALCIUM 8.7* 8.3*  MG  --  1.4*   Liver Function Tests:  Recent Labs Lab 07/03/2015 1116  AST 21  ALT 16  ALKPHOS 74  BILITOT 1.4*  PROT 7.0  ALBUMIN 3.7    Recent Labs Lab 07/06/2015 1116  LIPASE <10*   No results for input(s): AMMONIA in the last 168 hours. CBC:  Recent Labs Lab 06/30/2015 1116 06/23/15 0502  WBC 7.6 8.2  NEUTROABS 4.4  --   HGB 15.5* 15.1*  HCT 46.7* 45.0  MCV 80.0 80.9  PLT 164 186   Cardiac Enzymes: No results for input(s): CKTOTAL, CKMB, CKMBINDEX, TROPONINI in the last 168 hours. BNP (last 3 results) No results for input(s): BNP in the last 8760 hours.  ProBNP (last 3 results) No results for input(s): PROBNP in the last 8760 hours.  CBG:  Recent Labs Lab 07/07/2015 2220 06/23/15 0728 06/23/15 1159 06/23/15 1622 06/23/15 2141  GLUCAP 276* 263* 244* 264* 240*    Recent Results (from the past 240 hour(s))  Urine culture     Status: None (Preliminary result)   Collection Time: 07/09/2015 12:17 PM  Result Value Ref Range Status   Specimen Description URINE, CLEAN CATCH  Final   Special Requests Normal  Final   Culture   Final    CULTURE REINCUBATED FOR BETTER GROWTH Performed at [facility]    Report Status PENDING  Incomplete  Surgical pcr screen  Status: None   Collection Time: 06/16/2015  3:50 AM  Result Value Ref Range Status   MRSA, PCR NEGATIVE NEGATIVE Final   Staphylococcus aureus NEGATIVE NEGATIVE Final     Studies: Ct Abdomen Pelvis W Contrast  06/11/2015   CLINICAL DATA:  59 year old female with lower abdominal pain and vomiting for 3 days. Current history of stage IV colon cancer currently undergoing chemotherapy. Subsequent encounter.  EXAM: CT ABDOMEN AND PELVIS WITH CONTRAST  TECHNIQUE: Multidetector CT imaging of the abdomen and pelvis was performed using the standard protocol following bolus administration of intravenous contrast.  CONTRAST:  59mL OMNIPAQUE IOHEXOL 300 MG/ML SOLN,  154mL OMNIPAQUE IOHEXOL 300 MG/ML SOLN  COMPARISON:  CT chest abdomen and pelvis 06/17/2015, and earlier  FINDINGS: Stable and negative lung bases. No pericardial or pleural effusion. 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  No acute or suspicious osseous lesion identified.  Increased volume of pelvic free fluid, still overall small. Left perirectal soft tissue nodularity re- demonstrated (series 2, image 79). Diminutive urinary bladder. Gas and stool in the distal colon. Decompressed left colon and transverse colon. Small to large bowel anastomosis in the right upper quadrant.  The small bowel now is dilated throughout the abdomen and pelvis. Proximal and mid small bowel loops dilated up to 3.5 cm diameter. There is a transition point where the distal small bowel loops inter the right paracentral complex ventral abdominal hernia on series 2, image 51. Small bowel within the complex appearing hernia then is decompressed, and small bowel leaving the hernia and tracking toward the anastomosis also appears decompressed. Continued small volume of free fluid in the hernia sac. No definite fat stranding within the herniated mesenteric. There is mesenteric stranding and interloop fluid within the abdomen.  No pneumoperitoneum. Small volume of perihepatic free fluid is increased. No liver mass. Decompressed gallbladder with probable vicarious contrast excretion. Spleen and pancreas remain normal. Bilateral adrenal metastases are stable measuring 2.7-3.3 cm diameter. Portal venous system is patent. Aortoiliac calcified atherosclerosis noted. Major arterial structures in the abdomen and pelvis are patent. No lymphadenopathy identified. Renal enhancement and contrast excretion appears symmetric and within normal limits.  IMPRESSION: 1. Acute small bowel obstruction, and transition point appears to be the complex ventral abdominal hernia. Relatively high-grade of obstruction with mesenteric stranding  and interloop fluid, but no perforation or free air. 2. Mildly increased ascites since the recent restaging study on 06/17/2015. 3. Adrenal metastases and perirectal peritoneal nodularity are stable.   Electronically Signed   By: Genevie Ann M.D.   On: 07/09/2015 14:42   Dg Abd Acute W/chest  06/25/2015   CLINICAL DATA:  Acute generalized abdominal pain.  EXAM: DG ABDOMEN ACUTE W/ 1V CHEST  COMPARISON:  April 30, 2015.  FINDINGS: There is no evidence of free intraperitoneal air. Mildly dilated small bowel loops are noted in the left and epigastric regions. No colonic dilatation is noted. No radiopaque calculi or other significant radiographic abnormality is seen. Heart size and mediastinal contours are within normal limits. Both lungs are clear.  IMPRESSION: Mildly dilated small bowel loops are noted concerning for ileus or possibly distal small bowel obstruction. Continued radiographic follow-up is recommended.   Electronically Signed   By: Marijo Conception, M.D.   On: 06/15/2015 12:32    Scheduled Meds: .  ceFAZolin (ANCEF) IV  2 g Intravenous On Call to OR  . gabapentin  900 mg Oral BID  . heparin  5,000 Units Subcutaneous 3 times per  day  . insulin aspart  0-15 Units Subcutaneous TID WC  . insulin aspart  0-5 Units Subcutaneous QHS  . ondansetron  4 mg Intramuscular Once  . potassium chloride  20 mEq Oral Daily   Continuous Infusions: . sodium chloride 100 mL/hr at 06/23/15 3007    Principal Problem:   SBO (small bowel obstruction) Active Problems:   Type 2 diabetes mellitus   Essential hypertension   Adenocarcinoma, colon   Lower extremity edema   Hypomagnesemia   Peripheral neuropathy    Time spent: 15 minutes. Greater than 50% of this time was spent in direct contact with the patient coordinating care.    Domingo Mend, MD   [facility] Pager [phone]  If 7PM-7AM, please contact night-coverage at [email or web address], password [facility] 06/27/2015, 7:06 AM  LOS: 2 days    I,  Jessica D. Leonie Green, acting as scribe, recorded this note contemporaneously in the presence of Dr. Lelon Frohlich, M.D. on 06/12/2015.    I have reviewed the above documentation for accuracy and completeness, and I agree with the above.  Domingo Mend, MD [facility] Pager: [phone]

## 2015-06-24 NOTE — Progress Notes (Signed)
Pt's preprocedure EKG abnormal. EKG resulted ST with 1deg AV block. Dr. Arnoldo Morale notified and made aware. No further instructions at this time.

## 2015-06-24 NOTE — Transfer of Care (Signed)
Immediate Anesthesia Transfer of Care Note  Patient: Sharlotte Alamo  Procedure(s) Performed: Procedure(s): INCISIONAL HERNIORRHAPHY WITH MESH (N/A) INSERTION OF MESH  Patient Location: PACU  Anesthesia Type:General  Level of Consciousness: awake, oriented and patient cooperative  Airway & Oxygen Therapy: Patient Spontanous Breathing  Post-op Assessment: Report given to RN and Post -op Vital signs reviewed and stable  Post vital signs: Reviewed and stable  Last Vitals:  Filed Vitals:   07/06/2015 1220  BP: 136/81  Pulse:   Temp:   Resp: 19    Complications: No apparent anesthesia complications

## 2015-06-24 NOTE — Progress Notes (Signed)
Inpatient Diabetes Program Recommendations  AACE/ADA: New Consensus Statement on Inpatient Glycemic Control (2013)  Target Ranges:  Prepandial:   less than 140 mg/dL      Peak postprandial:   less than 180 mg/dL (1-2 hours)      Critically ill patients:  140 - 180 mg/dL   Results for JAKAILA, NORMENT (MRN [id number]) as of 06/25/2015 10:29  Ref. Range 06/23/2015 07:28 06/23/2015 11:59 06/23/2015 16:22 06/23/2015 21:41 06/25/2015 07:20  Glucose-Capillary Latest Ref Range: 65-99 mg/dL 263 (H) 244 (H) 264 (H) 240 (H) 248 (H)   Reason for Admission: Abdominal Pain/SBO  Diabetes history: DM 2 Outpatient Diabetes medications: Glipizide 10 mg Daily, Jentadueto (Tradjenta 2.5mg  - Metformin 1,000 mg) BID Current orders for Inpatient glycemic control: Novolog Moderate + HS scale  Inpatient Diabetes Program Recommendations Insulin - Basal: Glucose ranging from 240's-260's. Please consider Starting Lantus 10 units Q 24hrs.  Thanks,  Tama Headings RN, MSN, [facility] Inpatient Diabetes Coordinator Team Pager [phone]

## 2015-06-24 NOTE — Anesthesia Preprocedure Evaluation (Signed)
Anesthesia Evaluation  Patient identified by MRN, date of birth, ID band Patient awake    Reviewed: Allergy & Precautions, H&P , NPO status , Patient's Chart, lab work & pertinent test results  Airway Mallampati: II  TM Distance: >3 FB Neck ROM: Full    Dental no notable dental hx. (+) Teeth Intact   Pulmonary neg pulmonary ROS, former smoker,  breath sounds clear to auscultation  Pulmonary exam normal       Cardiovascular hypertension, Pt. on medications +CHF ( Nonischemic cardiomyopathy) negative cardio ROS  Rhythm:Regular Rate:Normal     Neuro/Psych PSYCHIATRIC DISORDERS Anxiety Depression negative neurological ROS  negative psych ROS   GI/Hepatic negative GI ROS, Neg liver ROS,   Endo/Other  negative endocrine ROSdiabetes, Type 2, Oral Hypoglycemic Agents  Renal/GU negative Renal ROS  negative genitourinary   Musculoskeletal negative musculoskeletal ROS (+)   Abdominal   Peds negative pediatric ROS (+)  Hematology negative hematology ROS (+)   Anesthesia Other Findings   Reproductive/Obstetrics negative OB ROS                             Anesthesia Physical Anesthesia Plan  ASA: III  Anesthesia Plan: General   Post-op Pain Management:    Induction: Intravenous, Rapid sequence and Cricoid pressure planned  Airway Management Planned: Oral ETT  Additional Equipment:   Intra-op Plan:   Post-operative Plan: Extubation in OR  Informed Consent: I have reviewed the patients History and Physical, chart, labs and discussed the procedure including the risks, benefits and alternatives for the proposed anesthesia with the patient or authorized representative who has indicated his/her understanding and acceptance.     Plan Discussed with:   Anesthesia Plan Comments:         Anesthesia Quick Evaluation

## 2015-06-24 NOTE — Care Management Note (Signed)
Case Management Note  Patient Details  Name: Monica Allen MRN: [id number] Date of Birth: [birth year]  Subjective/Objective:                  Pt admitted from home with SBO. Pt lives with her husband and will return home at discharge.  Action/Plan: Pt is having surgery today. Will continue to follow for discharge planning needs post op.  Expected Discharge Date:                  Expected Discharge Plan:  Home/Self Care  In-House Referral:  NA  Discharge planning Services  CM Consult  Post Acute Care Choice:  NA Choice offered to:  NA  DME Arranged:    DME Agency:     HH Arranged:    HH Agency:     Status of Service:  In process, will continue to follow  Medicare Important Message Given:    Date Medicare IM Given:    Medicare IM give by:    Date Additional Medicare IM Given:    Additional Medicare Important Message give by:     If discussed at [place] of Stay Meetings, dates discussed:    Additional Comments:  Joylene Draft, RN 07/09/2015, 1:31 PM

## 2015-06-24 NOTE — Clinical Documentation Improvement (Signed)
MD's, NP's, and PA's  Patient has BMI of 43.53.  Please document clinical condition pertaining to BMI?  Thank you    Possible Clinical conditions  Morbid Obesity   Obesity  Other condition  Cannot clinically determine   Treatment: evaluated  Thank You, Ree Kida ,RN Clinical Documentation Specialist:  [facility]

## 2015-06-24 NOTE — Anesthesia Procedure Notes (Signed)
Procedure Name: Intubation Date/Time: 06/23/2015 12:37 PM Performed by: Vista Deck Pre-anesthesia Checklist: Patient identified, Patient being monitored, Timeout performed, Emergency Drugs available and Suction available Patient Re-evaluated:Patient Re-evaluated prior to inductionOxygen Delivery Method: Circle System Utilized Preoxygenation: Pre-oxygenation with 100% oxygen Intubation Type: IV induction, Rapid sequence and Cricoid Pressure applied Laryngoscope Size: Miller and 2 Grade View: Grade II Tube type: Oral Tube size: 7.0 mm Number of attempts: 1 Airway Equipment and Method: Stylet and Oral airway Placement Confirmation: ETT inserted through vocal cords under direct vision,  positive ETCO2 and breath sounds checked- equal and bilateral Secured at: 21 cm Tube secured with: Tape Dental Injury: Teeth and Oropharynx as per pre-operative assessment

## 2015-06-24 NOTE — Op Note (Signed)
Patient:  Monica Allen  DOB:  1956-06-22  MRN:  [id number]   Preop Diagnosis:  Partial small bowel obstruction, incisional hernia  Postop Diagnosis:  Same, carcinomatosis  Procedure:  Incisional herniorrhaphy with mesh, biopsy of peritoneal implants  Surgeon:  Aviva Signs, M.D.  Anes:  Gen. endotracheal  Indications:  Patient is a 59 year old black female status post a right hemicolectomy as well as TAH-BSO with omentectomy in 2014 4.0 bili was colon carcinoma who now presents with a partial small bowel obstruction due to an incisional hernia through her midline incision. She has been undergoing chemotherapy for her cancer. The risks and benefits of the procedure including bleeding, infection, cardiopulmonary difficulties, and the possibility of recurrence of the hernia were fully explained to the patient, who gave informed consent.  Procedure note:  Patient is placed the supine position. After induction of general endotracheal anesthesia, the abdomen was prepped and draped using usual sterile technique with DuraPrep. Surgical site confirmation was performed.  A midline incision was made through the previous midline incision site. This was taken down to the fascia. The patient had a large hernia sac with an 8 cm longitudinal hernia defect. There are multiple peritoneal implants present in the hernia sac and a portion of these were sent to pathology further examination. The patient had multiple adhesions of small bowel to the anterior abdominal wall which was also covered with peritoneal implants. There was ascites present. Given the extent of the carcinomatosis, I was only able to free up approximately 80% of the anterior abdominal wall from the adhesions. The right superior margin of the hernia defect had several loops of small intestine that were densely adherent to the abdominal wall. I did not try to take these down. A 10 x 15 cm ventrax DualMesh was then inserted and secured at various  points circumferentially using 00 proline interrupted sutures. The remaining hernia sac was then closed over this mesh with both oh proline interrupted sutures as well as a running looped 0 Novafil suture. The subcutaneous layer was irrigated normal saline. A #10 flat Jackson-Pratt drain was placed into the subcutaneous tissue and brought through separate stab wound just inferior to the incision line. It was secured at the skin level using 3-0 nylon interrupted suture. The skin was closed using staples. Betadine ointment and dry sterile dressing were applied. An abdominal binder was given be applied in the recovery room.  All tape and needle counts were correct at the end of the procedure. Patient was extubated in the operating room and transferred to PACU in stable condition.  Complications:  None  EBL:  Less than 100 mL  Specimen:  Peritoneal implants, hernia sac

## 2015-06-25 ENCOUNTER — Encounter (HOSPITAL_COMMUNITY): Payer: Self-pay | Admitting: General Surgery

## 2015-06-25 ENCOUNTER — Inpatient Hospital Stay (HOSPITAL_COMMUNITY): Payer: BLUE CROSS/BLUE SHIELD

## 2015-06-25 DIAGNOSIS — I469 Cardiac arrest, cause unspecified: Secondary | ICD-10-CM

## 2015-06-25 LAB — BASIC METABOLIC PANEL
ANION GAP: 13 (ref 5–15)
BUN: 20 mg/dL (ref 6–20)
CALCIUM: 7.8 mg/dL — AB (ref 8.9–10.3)
CO2: 26 mmol/L (ref 22–32)
Chloride: 95 mmol/L — ABNORMAL LOW (ref 101–111)
Creatinine, Ser: 1.67 mg/dL — ABNORMAL HIGH (ref 0.44–1.00)
GFR calc Af Amer: 38 mL/min — ABNORMAL LOW (ref >60)
GFR, EST NON AFRICAN AMERICAN: 33 mL/min — AB (ref >60)
Glucose, Bld: 241 mg/dL — ABNORMAL HIGH (ref 65–99)
POTASSIUM: 3.6 mmol/L (ref 3.5–5.1)
SODIUM: 134 mmol/L — AB (ref 135–145)

## 2015-06-25 LAB — CBC
HCT: 39.9 % (ref 36.0–46.0)
Hemoglobin: 12.9 g/dL (ref 12.0–15.0)
MCH: 26.2 pg (ref 26.0–34.0)
MCHC: 32.3 g/dL (ref 30.0–36.0)
MCV: 80.9 fL (ref 78.0–100.0)
PLATELETS: 181 10*3/uL (ref 150–400)
RBC: 4.93 MIL/uL (ref 3.87–5.11)
RDW: 13.4 % (ref 11.5–15.5)
WBC: 6.6 10*3/uL (ref 4.0–10.5)

## 2015-06-25 LAB — GLUCOSE, CAPILLARY
GLUCOSE-CAPILLARY: 220 mg/dL — AB (ref 65–99)
GLUCOSE-CAPILLARY: 232 mg/dL — AB (ref 65–99)
GLUCOSE-CAPILLARY: 201 mg/dL — AB (ref 65–99)
GLUCOSE-CAPILLARY: 233 mg/dL — AB (ref 65–99)

## 2015-06-25 LAB — PHOSPHORUS: Phosphorus: 3.1 mg/dL (ref 2.5–4.6)

## 2015-06-25 LAB — MAGNESIUM: MAGNESIUM: 1.4 mg/dL — AB (ref 1.7–2.4)

## 2015-06-25 MED ORDER — BOOST / RESOURCE BREEZE PO LIQD
1.0000 | Freq: Two times a day (BID) | ORAL | Status: DC
  Administered 2015-06-25: 1 via ORAL

## 2015-06-25 MED ORDER — NOREPINEPHRINE BITARTRATE 1 MG/ML IV SOLN
0.0000 ug/min | INTRAVENOUS | Status: DC
  Filled 2015-06-25: qty 4

## 2015-06-25 MED ORDER — SODIUM CHLORIDE 0.9 % IJ SOLN: 9.0000 mL | INTRAMUSCULAR | Status: DC | PRN

## 2015-06-25 MED ORDER — HYDROMORPHONE 0.3 MG/ML IV SOLN: INTRAVENOUS | Status: DC

## 2015-06-25 MED ORDER — MAGNESIUM SULFATE 50 % IJ SOLN
1.0000 g | Freq: Once | INTRAMUSCULAR | Status: AC
  Administered 2015-06-26: 1 g via INTRAVENOUS
  Filled 2015-06-25: qty 2

## 2015-06-25 MED ORDER — SODIUM CHLORIDE 0.9 % IV BOLUS (SEPSIS)
1000.0000 mL | Freq: Once | INTRAVENOUS | Status: AC
  Administered 2015-06-25: 1000 mL via INTRAVENOUS

## 2015-06-25 MED ORDER — MAGNESIUM SULFATE 4 GM/100ML IV SOLN
4.0000 g | Freq: Once | INTRAVENOUS | Status: AC
Start: 2015-06-25 — End: 2015-06-25
  Administered 2015-06-25: 4 g via INTRAVENOUS
  Filled 2015-06-25: qty 100

## 2015-06-25 MED ORDER — NALOXONE HCL 0.4 MG/ML IJ SOLN
0.4000 mg | INTRAMUSCULAR | Status: DC | PRN
Start: 2015-06-25 — End: 2015-06-26

## 2015-06-25 MED ORDER — EPINEPHRINE HCL 0.1 MG/ML IJ SOSY
PREFILLED_SYRINGE | INTRAMUSCULAR | Status: AC
  Filled 2015-06-25: qty 30

## 2015-06-25 MED ORDER — SIMETHICONE 80 MG PO CHEW
80.0000 mg | CHEWABLE_TABLET | Freq: Four times a day (QID) | ORAL | Status: DC | PRN
  Administered 2015-06-25 (×2): 80 mg via ORAL
  Filled 2015-06-25 (×2): qty 1

## 2015-06-25 MED ORDER — ENSURE ENLIVE PO LIQD
237.0000 mL | Freq: Two times a day (BID) | ORAL | Status: DC
  Administered 2015-06-25: 237 mL via ORAL

## 2015-06-25 NOTE — Progress Notes (Signed)
1 Day Post-Op  Subjective: Patient alert. She states she has no nausea. She has not had flatus or bowel movement yet.  Objective: Vital signs in last 24 hours: Temp:  [97.2 F (36.2 C)-99.2 F (37.3 C)] 98.9 F (37.2 C) (08/16 0602) Pulse Rate:  [85-105] 88 (08/16 0602) Resp:  [14-25] 20 (08/16 0602) BP: (127-146)/(68-90) 135/75 mmHg (08/16 0602) SpO2:  [91 %-100 %] 97 % (08/16 0602) Weight:  [109.77 kg (242 lb)] 109.77 kg (242 lb) (08/15 1150) Last BM Date: 06/23/15  Intake/Output from previous day: 08/15 0701 - 08/16 0700 In: 1941.7 [I.V.:1891.7] Out: 1915 [Drains:15; Blood:1250] Intake/Output this shift:    General appearance: alert, cooperative and no distress Resp: clear to auscultation bilaterally Cardio: regular rate and rhythm, S1, S2 normal, no murmur, click, rub or gallop GI: soft. Dressing dry and intact. JP drainage serosanguineous in nature.  Lab Results:   Recent Labs  07/01/2015 0742 06/25/15 0615  WBC 5.7 6.6  HGB 13.7 12.9  HCT 41.5 39.9  PLT 171 181   BMET  Recent Labs  07/09/2015 0742 06/25/15 0615  NA 134* 134*  K 4.1 3.6  CL 96* 95*  CO2 27 26  GLUCOSE 263* 241*  BUN 20 20  CREATININE 0.74 1.67*  CALCIUM 8.2* 7.8*   PT/INR No results for input(s): LABPROT, INR in the last 72 hours.  Studies/Results: No results found.  Anti-infectives: Anti-infectives    Start     Dose/Rate Route Frequency Ordered Stop   06/17/2015 2000  ceFAZolin (ANCEF) IVPB 2 g/50 mL premix     2 g 100 mL/hr over 30 Minutes Intravenous Every 8 hours 07/10/2015 1605 06/25/15 0514   06/23/2015 0600  ceFAZolin (ANCEF) IVPB 2 g/50 mL premix     2 g 100 mL/hr over 30 Minutes Intravenous On call to O.R. 06/23/15 1346 06/11/2015 1243      Assessment/Plan: s/p Procedure(s): INCISIONAL HERNIORRHAPHY WITH MESH INSERTION OF MESH Impression: Stable on postoperative day 1. Nausea seems to have resolved. Creatinine has increased, most likely secondary to her ascites from her  carcinomatosis and surgery. We'll give fluid boluses. Will supplement magnesium. She is aware of the findings at the time of surgery. We'll advance diet once her bowel function has returned.  LOS: 3 days    Nattaly Yebra A 06/25/2015

## 2015-06-25 NOTE — Progress Notes (Signed)
Initial Nutrition Assessment  DOCUMENTATION CODES:  Morbid obesity  INTERVENTION:  Boost Breeze po BID, each supplement provides 250 kcal and 9 grams of protein  Once diet advanced:  Ensure Enlive po BID, each supplement provides 350 kcal and 20 grams of protein  Gave diet education on how to help prevent SBO with removing fiber from diet. Left handout titled "Bowel obstruction and ileus prevention". Also left coupons and Ensure samples.   NUTRITION DIAGNOSIS:  Increased nutrient needs related to cancer and cancer related treatments as evidenced by loss of >10% bw in 10 months  GOAL:  Patient will meet greater than or equal to 90% of their needs  MONITOR:  PO intake, Supplement acceptance, Diet advancement, I & O's, Weight trends  REASON FOR ASSESSMENT:  Malnutrition Screening Tool    ASSESSMENT:  59 y.o. female  PMHx: DM, HTN, Morbid Obesity, goiter and stage IV colon cancer s/p right hemicolectomy, TAH and BSO who is being followed by RD at [facility]. Presents with worsening Abdominal pain x 2 days associated with n/v . Very poor oral intake during this period. Was found to have SBO, now s/p incisional herniorrhapy with insertion of mesh  Pt reports that that over the past months she has seen an overall decrease in her appetite. She denies any n/v/c/d until this recent/acute incident. She states that she "just isnt hungry". She has lost ~30 lbs in 10 months for this reason (not quite clinically significant)  She takes Ensure/ Boost supplements but it sounds like she does not always have the means to purchase them.   I discussed with her how to prevent SBO with her diet by removing/decreasing fiber intake. We discussed eating only white grains, removing seeds/nuts/skins from diet, and not eating vegetables that arent very well cooked. She stated that she does have a garden and she has been eating fresh/uncooked produce. Left handout titled "Bowel obstruction and ileus prevention",  supplement samples and coupons.  Diet Order:  Diet clear liquid Room service appropriate?: Yes; Fluid consistency:: Thin  Skin: Abdominal incision  Last BM:  8/14  Height:  Ht Readings from Last 1 Encounters:  06/19/2015 5' 2.25" (1.581 m)   Weight:  Wt Readings from Last 1 Encounters:  07/07/2015 242 lb (109.77 kg)   Wt Readings from Last 10 Encounters:  06/30/2015 242 lb (109.77 kg)  06/10/15 242 lb 9.6 oz (110.043 kg)  05/27/15 246 lb (111.585 kg)  05/14/15 249 lb 9.6 oz (113.218 kg)  04/30/15 254 lb (115.214 kg)  04/29/15 254 lb 3.2 oz (115.304 kg)  04/18/15 259 lb 3.2 oz (117.572 kg)  04/01/15 257 lb 12.8 oz (116.937 kg)  03/18/15 255 lb 4.8 oz (115.803 kg)  03/04/15 253 lb 12.8 oz (115.123 kg)  Was 270 lbs in during fall 2015.   Ideal Body Weight:  50.57 kg  BMI:  Body mass index is 43.92 kg/(m^2).  Estimated Nutritional Needs:  Kcal:  1550-1650 kcals (14-15 kcal/kg) Protein:  61-76 (1.2-1.5 g/kg ibw) Fluid:  1.6-1.7 liters  EDUCATION NEEDS:  Education needs addressed  Burtis Junes RD, LDN Nutrition Pager: [phone] 06/25/2015 12:43 PM

## 2015-06-25 NOTE — Care Management Note (Signed)
Case Management Note  Patient Details  Name: Monica Allen MRN: [id number] Date of Birth: 01/17/1956  Subjective/Objective:                    Action/Plan:   Expected Discharge Date:                  Expected Discharge Plan:  Home/Self Care  In-House Referral:  NA  Discharge planning Services  CM Consult  Post Acute Care Choice:  NA Choice offered to:  NA  DME Arranged:    DME Agency:     HH Arranged:    HH Agency:     Status of Service:  In process, will continue to follow  Medicare Important Message Given:    Date Medicare IM Given:    Medicare IM give by:    Date Additional Medicare IM Given:    Additional Medicare Important Message give by:     If discussed at [place] of Stay Meetings, dates discussed:    Additional Comments: Pt day 1 post op of incisional hernia repair. Pt stated that she lives at home with her husband and will return home at discharge. Pt stated that she was independent with ADL's. No CM needs anticipated at discharge. Christinia Gully Rake, RN 06/25/2015, 2:04 PM

## 2015-06-25 NOTE — Progress Notes (Signed)
Subjective: Pt apparently had nausea vomitting and then went into cardiac arrest.  Pt intubated and received CPR. Pt was Coded by ED physician,  Received multiple epi, bicarb, and was PEA for a while.  Please see code note for details.  Pt eventually had SVT with pulse. Pt will be transferred to ICU.   Objective: Vital signs in last 24 hours: Temp:  [98.9 F (37.2 C)] 98.9 F (37.2 C) (08/16 1500) Pulse Rate:  [88-97] 97 (08/16 1500) Resp:  [20] 20 (08/16 1500) BP: (88-135)/(43-82) 88/43 mmHg (08/16 1500) SpO2:  [97 %] 97 % (08/16 1500) Weight change:  Last BM Date: 06/23/15  Intake/Output from previous day: 08/15 0701 - 08/16 0700 In: 1941.7 [I.V.:1891.7] Out: 1915 [Drains:15; Blood:1250] Intake/Output this shift:    Heent: anicteric, intubated Neck: no jvd Heart: tachy s1, s2 Lung:  ABd: obese,  Ext: no c/c/e   Lab Results:  Recent Labs  07/05/2015 0742 06/25/15 0615  WBC 5.7 6.6  HGB 13.7 12.9  HCT 41.5 39.9  PLT 171 181   BMET  Recent Labs  06/18/2015 0742 06/25/15 0615  NA 134* 134*  K 4.1 3.6  CL 96* 95*  CO2 27 26  GLUCOSE 263* 241*  BUN 20 20  CREATININE 0.74 1.67*  CALCIUM 8.2* 7.8*    Studies/Results: No results found.  Medications: I have reviewed the patient's current medications.  Assessment/Plan: Cardiac pulmonary arrest Intubated Transfer to ICU.  Check cbc, cmp, d dimer, trop i q6h x3, Check cardiac echo If d dimer is positive and creatinine wnl, then CTA chest r/o PE ED consulted surgery for access.  Pt does have ? portacath but ? May not be working Appreciate surgery input.   Critical care 45 minutes   LOS: 3 days   Jani Gravel 06/25/2015, 11:05 PM

## 2015-06-25 NOTE — Anesthesia Postprocedure Evaluation (Signed)
  Anesthesia Post-op Note  Patient: Monica Allen  Procedure(s) Performed: Procedure(s): INCISIONAL HERNIORRHAPHY WITH MESH (N/A) INSERTION OF MESH (N/A)  Patient Location: Nursing Unit  Anesthesia Type:General  Level of Consciousness: awake, alert  and oriented  Airway and Oxygen Therapy: Patient Spontanous Breathing  Post-op Pain: mild  Post-op Assessment: Post-op Vital signs reviewed, Patient's Cardiovascular Status Stable, Respiratory Function Stable and Patent Airway, Some nausea controlled with Zofran, but no vomiting.               Post-op Vital Signs: Reviewed and stable  Last Vitals:  Filed Vitals:   06/25/15 0602  BP: 135/75  Pulse: 88  Temp: 37.2 C  Resp: 20    Complications: No apparent anesthesia complications

## 2015-06-25 NOTE — Addendum Note (Signed)
Addendum  created 06/25/15 1049 by Ollen Bowl, CRNA   Modules edited: Notes Section   Notes Section:  File: [id number]

## 2015-06-25 NOTE — Progress Notes (Signed)
[facility] PROGRESS NOTE  Monica Allen BWG:[id number] DOB: Mar 14, 1956 DOA: 07/02/2015 PCP: [name]  Brief HPI: Patient is a 59 y/o woman admitted on 06/12/2015 with abdominal pain, n/v. Found to have a PSBO with transition point at area of ventral hernia. Was taken to the OR on 06/30/2015 by Dr. Arnoldo Morale. Hernia was repaired, large amount of peritoneal carcinomatosis was noted. Continue to advance diet as tolerated. Dr. Arnoldo Morale on board.  Assessment/Plan: Small bowel obstruction -CT shows dilated small bowel loops with transition point at area of ventral abdominal hernia. -S/p OR with Dr. Arnoldo Morale 8/15 with correction of hernia and peritoneal carcinomatosis. -Continue to advance diet as bowel function returns.  Stage IV adenocarcinoma of the colon -Continue chemotherapy as scheduled with oncology.  Hypomagnesemia -Magnesium level is 1.6,  -Continue supplementation, recheck levels in am.  Hypertension -Well-controlled, continue home medications.  Diabetes -CBGs starting to increase. -May need to add long-acting insulin if appetite picks up.  Code Status: Full code DVT prophylaxis: Heparin  Family Communication: Patient only, all questions answered  Disposition Plan: [place] home in 48-72 hours.   Consultants:  General Surgery, Dr. Arnoldo Morale  Antibiotics:  None (Ancef perioperatively)  Subjective: C/o significant abdominal pain.  Objective: Filed Vitals:   06/21/2015 1524 07/09/2015 2122 06/25/15 0602 06/25/15 1211  BP: 146/68 138/73 135/75 132/82  Pulse: 98 101 88   Temp: 99 F (37.2 C) 99.2 F (37.3 C) 98.9 F (37.2 C)   TempSrc: Oral Oral Oral   Resp: 20 20 20    Height:      Weight:      SpO2: 97% 96% 97%     Intake/Output Summary (Last 24 hours) at 06/25/15 1343 Last data filed at 06/25/15 0939  Gross per 24 hour  Intake 781.67 ml  Output   1265 ml  Net -483.33 ml   Filed Weights   07/09/2015 0958 06/18/2015 1150  Weight: 109.77  kg (242 lb) 109.77 kg (242 lb)    Exam:   General:  AA Ox3,  Cardiovascular: RRR  Respiratory: CTA B  Abdomen: diffusely TTP  Extremities: no C/C/E  Neurologic:  Intact/non-focal  Data Reviewed: Basic Metabolic Panel:  Recent Labs Lab 06/21/2015 1116 06/23/15 0502 06/17/2015 0742 06/25/15 0615  NA 135 137 134* 134*  K 3.8 4.4 4.1 3.6  CL 99* 101 96* 95*  CO2 23 24 27 26   GLUCOSE 279* 284* 263* 241*  BUN 12 16 20 20   CREATININE 0.60 0.75 0.74 1.67*  CALCIUM 8.7* 8.3* 8.2* 7.8*  MG  --  1.4* 1.6* 1.4*  PHOS  --   --   --  3.1   Liver Function Tests:  Recent Labs Lab 06/18/2015 1116  AST 21  ALT 16  ALKPHOS 74  BILITOT 1.4*  PROT 7.0  ALBUMIN 3.7    Recent Labs Lab 07/05/2015 1116  LIPASE <10*   No results for input(s): AMMONIA in the last 168 hours. CBC:  Recent Labs Lab 07/05/2015 1116 06/23/15 0502 06/17/2015 0742 06/25/15 0615  WBC 7.6 8.2 5.7 6.6  NEUTROABS 4.4  --   --   --   HGB 15.5* 15.1* 13.7 12.9  HCT 46.7* 45.0 41.5 39.9  MCV 80.0 80.9 80.6 80.9  PLT 164 186 171 181   Cardiac Enzymes: No results for input(s): CKTOTAL, CKMB, CKMBINDEX, TROPONINI in the last 168 hours. BNP (last 3 results) No results for input(s): BNP in the last 8760 hours.  ProBNP (last 3 results) No results for  input(s): PROBNP in the last 8760 hours.  CBG:  Recent Labs Lab 06/21/2015 1423 06/22/2015 1625 06/29/2015 2050 06/25/15 0724 06/25/15 1117  GLUCAP 193* 200* 161* 220* 232*    Recent Results (from the past 240 hour(s))  Urine culture     Status: None   Collection Time: 07/06/2015 12:17 PM  Result Value Ref Range Status   Specimen Description URINE, CLEAN CATCH  Final   Special Requests Normal  Final   Culture   Final    MULTIPLE SPECIES PRESENT, SUGGEST RECOLLECTION Performed at [facility]    Report Status 06/12/2015 FINAL  Final  Surgical pcr screen     Status: None   Collection Time: 06/20/2015  3:50 AM  Result Value Ref Range Status    MRSA, PCR NEGATIVE NEGATIVE Final   Staphylococcus aureus NEGATIVE NEGATIVE Final     Studies: No results found.  Scheduled Meds: . carvedilol  80 mg Oral Q breakfast  . enoxaparin (LOVENOX) injection  50 mg Subcutaneous Q24H  . feeding supplement  1 Container Oral BID BM  . feeding supplement (ENSURE ENLIVE)  237 mL Oral BID BM  . gabapentin  900 mg Oral BID  . hydrocortisone cream   Topical BID  . insulin aspart  0-15 Units Subcutaneous TID WC  . insulin aspart  0-5 Units Subcutaneous QHS  . linagliptin  2.5 mg Oral BID WC  . magnesium sulfate 1 - 4 g bolus IVPB  4 g Intravenous Once  . ondansetron  4 mg Intramuscular Once  . pantoprazole  40 mg Oral Q1200  . sodium chloride  1,000 mL Intravenous Once   Continuous Infusions: . lactated ringers 125 mL/hr at 07/04/2015 1701    Principal Problem:   SBO (small bowel obstruction) Active Problems:   Type 2 diabetes mellitus   Essential hypertension   Adenocarcinoma, colon   Lower extremity edema   Hypomagnesemia   Peripheral neuropathy    Time spent: 15 minutes. Greater than 50% of this time was spent in direct contact with the patient coordinating care.    Domingo Mend, MD   [facility] Pager [phone]  If 7PM-7AM, please contact night-coverage at [email or web address], password [facility] 06/25/2015, 1:43 PM  LOS: 3 days

## 2015-06-25 NOTE — Progress Notes (Signed)
06/25/15 CBGs continue to be elevated.  220 today.  Please considering adding Lantus 10 units.  Thank you. [place], CDE. M.Ed. Pager [phone] Inpatient Diabetes Coordinator

## 2015-06-26 ENCOUNTER — Inpatient Hospital Stay (HOSPITAL_COMMUNITY): Payer: BLUE CROSS/BLUE SHIELD

## 2015-06-26 LAB — APTT: APTT: 51 s — AB (ref 24–37)

## 2015-06-26 LAB — PROTIME-INR
INR: 1.78 — ABNORMAL HIGH (ref 0.00–1.49)
Prothrombin Time: 20.7 seconds — ABNORMAL HIGH (ref 11.6–15.2)

## 2015-06-26 LAB — COMPREHENSIVE METABOLIC PANEL
ALT: 894 U/L — ABNORMAL HIGH (ref 14–54)
AST: 1922 U/L — AB (ref 15–41)
Albumin: 2.2 g/dL — ABNORMAL LOW (ref 3.5–5.0)
Alkaline Phosphatase: 72 U/L (ref 38–126)
Anion gap: 21 — ABNORMAL HIGH (ref 5–15)
BUN: 31 mg/dL — AB (ref 6–20)
CHLORIDE: 101 mmol/L (ref 101–111)
CO2: 17 mmol/L — ABNORMAL LOW (ref 22–32)
Calcium: 7 mg/dL — ABNORMAL LOW (ref 8.9–10.3)
Creatinine, Ser: 2.44 mg/dL — ABNORMAL HIGH (ref 0.44–1.00)
GFR calc Af Amer: 24 mL/min — ABNORMAL LOW (ref >60)
GFR calc non Af Amer: 21 mL/min — ABNORMAL LOW (ref >60)
Glucose, Bld: 190 mg/dL — ABNORMAL HIGH (ref 65–99)
POTASSIUM: 3.5 mmol/L (ref 3.5–5.1)
Sodium: 139 mmol/L (ref 135–145)
Total Bilirubin: 0.9 mg/dL (ref 0.3–1.2)
Total Protein: 5.2 g/dL — ABNORMAL LOW (ref 6.5–8.1)

## 2015-06-26 LAB — CBC WITH DIFFERENTIAL/PLATELET
BASOS ABS: 0.1 10*3/uL (ref 0.0–0.1)
BASOS PCT: 1 % (ref 0–1)
EOS ABS: 0.1 10*3/uL (ref 0.0–0.7)
Eosinophils Relative: 1 % (ref 0–5)
HCT: 39.8 % (ref 36.0–46.0)
Hemoglobin: 13 g/dL (ref 12.0–15.0)
Lymphocytes Relative: 36 % (ref 12–46)
Lymphs Abs: 4.1 10*3/uL — ABNORMAL HIGH (ref 0.7–4.0)
MCH: 26.5 pg (ref 26.0–34.0)
MCHC: 32.7 g/dL (ref 30.0–36.0)
MCV: 81.1 fL (ref 78.0–100.0)
MONOS PCT: 2 % — AB (ref 3–12)
Monocytes Absolute: 0.2 10*3/uL (ref 0.1–1.0)
NEUTROS PCT: 60 % (ref 43–77)
Neutro Abs: 6.9 10*3/uL (ref 1.7–7.7)
PLATELETS: 169 10*3/uL (ref 150–400)
RBC: 4.91 MIL/uL (ref 3.87–5.11)
RDW: 13.5 % (ref 11.5–15.5)
WBC: 11.4 10*3/uL — AB (ref 4.0–10.5)

## 2015-06-26 LAB — D-DIMER, QUANTITATIVE (NOT AT ARMC)

## 2015-06-26 LAB — GLUCOSE, CAPILLARY: Glucose-Capillary: 190 mg/dL — ABNORMAL HIGH (ref 65–99)

## 2015-06-26 LAB — MAGNESIUM: Magnesium: 2.6 mg/dL — ABNORMAL HIGH (ref 1.7–2.4)

## 2015-06-26 LAB — TSH: TSH: 3.047 u[IU]/mL (ref 0.350–4.500)

## 2015-06-26 LAB — TROPONIN I: TROPONIN I: 0.08 ng/mL — AB (ref <0.031)

## 2015-06-26 MED ORDER — SODIUM CHLORIDE 0.9 % IJ SOLN: 10.0000 mL | Freq: Two times a day (BID) | INTRAMUSCULAR | Status: DC

## 2015-06-26 MED ORDER — SODIUM CHLORIDE 0.9 % IV SOLN
INTRAVENOUS | Status: DC
  Administered 2015-06-26: 01:00:00 via INTRAVENOUS

## 2015-06-26 MED ORDER — SODIUM CHLORIDE 0.9 % IV SOLN: 250.0000 mL | INTRAVENOUS | Status: DC | PRN

## 2015-06-26 MED ORDER — VASOPRESSIN 20 UNIT/ML IV SOLN
INTRAVENOUS | Status: AC
  Filled 2015-06-26: qty 2

## 2015-06-26 MED ORDER — DEXTROSE 5 % IV SOLN
0.0000 ug/min | INTRAVENOUS | Status: DC
  Administered 2015-06-26: 30 ug/min via INTRAVENOUS
  Filled 2015-06-26: qty 16

## 2015-06-26 MED ORDER — ASPIRIN 300 MG RE SUPP
RECTAL | Status: AC
  Filled 2015-06-26: qty 1

## 2015-06-26 MED ORDER — ASPIRIN 81 MG PO CHEW: 324.0000 mg | CHEWABLE_TABLET | ORAL | Status: DC

## 2015-06-26 MED ORDER — NOREPINEPHRINE BITARTRATE 1 MG/ML IV SOLN
INTRAVENOUS | Status: AC
  Filled 2015-06-26: qty 16

## 2015-06-26 MED ORDER — ASPIRIN 300 MG RE SUPP
300.0000 mg | RECTAL | Status: DC
  Filled 2015-06-26: qty 1

## 2015-06-26 MED ORDER — SODIUM CHLORIDE 0.9 % IV BOLUS (SEPSIS)
500.0000 mL | Freq: Once | INTRAVENOUS | Status: AC
  Administered 2015-06-26: 500 mL via INTRAVENOUS

## 2015-06-26 MED ORDER — SODIUM CHLORIDE 0.9 % IV SOLN
1000.0000 mg | Freq: Once | INTRAVENOUS | Status: AC
  Administered 2015-06-26: 1000 mg via INTRAVENOUS
  Filled 2015-06-26: qty 10

## 2015-06-26 MED ORDER — LEVETIRACETAM 500 MG/5ML IV SOLN
INTRAVENOUS | Status: AC
  Filled 2015-06-26: qty 10

## 2015-06-26 MED ORDER — LORAZEPAM 2 MG/ML IJ SOLN: 2.0000 mg | Freq: Once | INTRAMUSCULAR | Status: DC

## 2015-06-26 MED ORDER — VASOPRESSIN 20 UNIT/ML IV SOLN
0.0300 [IU]/min | INTRAVENOUS | Status: DC
  Administered 2015-06-26: 0.03 [IU]/min via INTRAVENOUS
  Filled 2015-06-26: qty 2

## 2015-06-26 MED ORDER — SODIUM CHLORIDE 0.9 % IJ SOLN: 10.0000 mL | INTRAMUSCULAR | Status: DC | PRN

## 2015-06-29 MED FILL — Medication: Qty: 1 | Status: AC

## 2015-07-08 ENCOUNTER — Ambulatory Visit (HOSPITAL_COMMUNITY): Payer: BLUE CROSS/BLUE SHIELD | Admitting: Oncology

## 2015-07-11 NOTE — ED Provider Notes (Signed)
[place]  Department of Emergency Medicine   Code Blue CONSULT NOTE  Chief Complaint: Cardiac arrest/unresponsive   Level V Caveat: Unresponsive  History of present illness: I was contacted by the hospital for a CODE BLUE cardiac arrest upstairs and presented to the patient's bedside. Patient completely unresponsive. She had vomited multiple times, green, bilious liquid. Patient was already receiving CPR upon my arrival.  ROS: Unable to obtain, Level V caveat  Scheduled Meds: . aspirin  324 mg Oral NOW   Or  . aspirin  300 mg Rectal NOW  . enoxaparin (LOVENOX) injection  50 mg Subcutaneous Q24H  . hydrocortisone cream   Topical BID  . insulin aspart  0-15 Units Subcutaneous TID WC  . insulin aspart  0-5 Units Subcutaneous QHS  . magnesium sulfate  1 g Intravenous Once  . ondansetron  4 mg Intramuscular Once  . sodium chloride  10-40 mL Intracatheter Q12H   Continuous Infusions: . lactated ringers 125 mL/hr at 07/03/2015 1701  . norepinephrine (LEVOPHED) Adult infusion     PRN Meds:.sodium chloride, acetaminophen **OR** acetaminophen, hydrALAZINE, [DISCONTINUED] ondansetron **OR** ondansetron (ZOFRAN) IV, senna, sodium chloride Past Medical History  Diagnosis Date  . Diabetes mellitus   . Hypertension   . Enlarged heart   . Goiter   . Nonischemic cardiomyopathy   . Anemia   . Abdominal hernia   . 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 Wisdom tooth extraction    . Laparotomy  12/13/2012    Procedure: EXPLORATORY LAPAROTOMY;  Surgeon: Alvino Chapel, MD;  Location: [facility];  Service: Gynecology;  Laterality: N/A;  . Abdominal hysterectomy  12/13/2012    Procedure: HYSTERECTOMY ABDOMINAL;  Surgeon: Alvino Chapel, MD;  Location: [facility];  Service: Gynecology;  Laterality: N/A;  . Salpingoophorectomy   12/13/2012    Procedure: SALPINGO OOPHORECTOMY;  Surgeon: Alvino Chapel, MD;  Location: [facility];  Service: Gynecology;  Laterality: Bilateral;  . Omentectomy  12/13/2012    Procedure: OMENTECTOMY;  Surgeon: Alvino Chapel, MD;  Location: [facility];  Service: Gynecology;  Laterality: N/A;  . Colonoscopy N/A 02/14/2013    Procedure: COLONOSCOPY;  Surgeon: Danie Binder, MD;  Location: [facility];  Service: Endoscopy;  Laterality: N/A;  2:00 PM-moved to [place] notified pt  . Partial colectomy N/A 03/01/2013    Procedure: PARTIAL COLECTOMY;  Surgeon: Jamesetta So, MD;  Location: [facility]S;  Service: General;  Laterality: N/A;  . Portacath placement Left 03/01/2013    Procedure: INSERTION PORT-A-CATH;  Surgeon: Jamesetta So, MD;  Location: [facility];  Service: General;  Laterality: Left;  . Incisional hernia repair N/A 06/16/2015    Procedure: Fatima Blank HERNIORRHAPHY WITH MESH;  Surgeon: Aviva Signs, MD;  Location: [facility];  Service: General;  Laterality: N/A;  . Insertion of mesh N/A 07/09/2015    Procedure: INSERTION OF MESH;  Surgeon: Aviva Signs, MD;  Location: [facility];  Service: General;  Laterality: N/A;   Social History   Social History  . Marital Status: Married    Spouse Name: N/A  . Number of Children: N/A  . Years of Education: N/A   Occupational History  . self-employed     childcare provider   Social History Main Topics  . Smoking status: Former Smoker -- 1.50 packs/day for 25  years    Types: Cigarettes    Quit date: 12/10/1987  . Smokeless tobacco: Never Used     Comment: quit 22 years ago  . Alcohol Use: No     Comment: last use x1 month.  . Drug Use: No  . Sexual Activity: Yes    Birth Control/ Protection: Surgical   Other Topics Concern  . Not on file   Social History Narrative   Allergies  Allergen Reactions  . Actos [Pioglitazone] Other (See Comments)    Heart doctor told me not to take this drug.   . Ibuprofen Other (See Comments)    "made my  stomach hurt"    Last set of Vital Signs (not current) Filed Vitals:   06/25/15 1500  BP: 88/43  Pulse: 97  Temp: 98.9 F (37.2 C)  Resp: 20      Physical Exam Gen: unresponsive Cardiovascular: pulseless  Resp: apneic. Breath sounds equal bilaterally with bagging  Abd: distended  Neuro: GCS 3, unresponsive to pain  HEENT: No blood in posterior pharynx, gag reflex absent  Neck: No crepitus  Musculoskeletal: No deformity  Skin: warm  Procedures  INTUBATION Performed by: Joseph Berkshire J. Required items: required blood products, implants, devices, and special equipment available Patient identity confirmed: provided demographic data and hospital-assigned identification number Time out: Immediately prior to procedure a "time out" was called to verify the correct patient, procedure, equipment, support staff and site/side marked as required. Indications: Respiratory arrest  Intubation method: Direct laryngoscopy with Mac 4 blade Preoxygenation: BVM Sedatives: None  Paralytic: None  Tube Size: 7.5 cuffed Post-procedure assessment: chest rise and ETCO2 monitor Breath sounds: equal and absent over the epigastrium Tube secured by Respiratory Therapy Patient tolerated the procedure well with no immediate complications.  CRITICAL CARE Performed by: Orpah Greek Total critical care time: 58min Critical care time was exclusive of separately billable procedures and treating other patients. Critical care was necessary to treat or prevent imminent or life-threatening deterioration. Critical care was time spent personally by me on the following activities: development of treatment plan with patient and/or surrogate as well as nursing, discussions with consultants, evaluation of patient's response to treatment, examination of patient, obtaining history from patient or surrogate, ordering and performing treatments and interventions, ordering and review of laboratory  studies, ordering and review of radiographic studies, pulse oximetry and re-evaluation of patient's condition.  Cardiopulmonary Resuscitation (CPR) Procedure Note  Directed/Performed by: Orpah Greek I personally directed ancillary staff and/or performed CPR in an effort to regain return of spontaneous circulation and to maintain cardiac, neuro and systemic perfusion.    Medical Decision making  Patient unresponsive upon my arrival to the room. Patient reportedly had brief episode of V. tach and then became asystolic. CPR was initiated. I was able to intubate the patient without difficulty. CPR continued. Patient given multiple courses of epinephrine and one dose of bicarbonate. Patient found to be in atrial fibrillation with rapid ventricular response and return of spontaneous circulation. Patient initiated on Levophed drip. Care turned over to Dr. Maudie Mercury, hospitalist service.        Orpah Greek, MD 2015-07-14 [phone]

## 2015-07-11 NOTE — Progress Notes (Signed)
Patient's SBP dropped tp 50s. E-link was called and notified. Orders were received and followed. Continue to monitor the patient.

## 2015-07-11 NOTE — Procedures (Signed)
Central Venous Catheter Insertion Procedure Note Monica Allen [id number] [birth year]  Procedure: Insertion of Central Venous Catheter Indications: Assessment of intravascular volume, Drug and/or fluid administration and Frequent blood sampling  Procedure Details Consent: Unable to obtain consent because of emergent medical necessity. Time Out: Verified patient identification, verified procedure, site/side was marked, verified correct patient position, special equipment/implants available, medications/allergies/relevent history reviewed, required imaging and test results available.  Performed  Maximum sterile technique was used including antiseptics, cap, gloves, gown, hand hygiene, mask and sheet. Skin prep: Chlorhexidine; local anesthetic administered A antimicrobial bonded/coated triple lumen catheter was placed in the right femoral vein due to emergent situation using the Seldinger technique.  Evaluation Blood flow good Complications: No apparent complications Patient did tolerate procedure well.   Monica Allen A Jul 04, 2015, 12:10 AM

## 2015-07-11 NOTE — Discharge Summary (Signed)
Physician Discharge Summary  Patient ID: Monica Allen MRN: [id number] DOB/AGE: 1955-11-26 59 y.o.  Admit date: 06/21/2015 Discharge date: Jul 13, 2015  Admission Diagnoses: SBO  Discharge Diagnoses:  Principal Problem:   SBO (small bowel obstruction) Active Problems:   Type 2 diabetes mellitus   Essential hypertension   Adenocarcinoma, colon   Lower extremity edema   Hypomagnesemia   Peripheral neuropathy   Cardiac arrest Peritoneal carcinomatosis  Discharged Condition: critical  Hospital Course: 59 yo female with hx of dm2, colon cancer, apparentlly presented due to n/v, and found to have SBO.  Pt was taken to OR, see operative notes for details.  This past evening pt had n/v, and then had cardiac arrest.  Pt was coded by ER physician, and intubated.  Pt transferred to ICU and pressors were given. Pt was noted to be in multisystem organ failure.  Pt's family after discussion with Dr. Arnoldo Morale decided against any heroic measures.   Consults: general surgery  Significant Diagnostic Studies: labs:   Treatments: IV hydration  Discharge Exam: Blood pressure 64/25, pulse 41, temperature 100.4 F (38 C), temperature source Axillary, resp. rate 0, height 5\' 6"  (1.676 m), weight 113.7 kg (250 lb 10.6 oz), last menstrual period 10/07/2012, SpO2 55 %.  Heent: anicteric, pupils 1.25mm fixed Neck: no carotid pulse Heart: no hs Lungs: no bs Ext: no c/c/e   Disposition: 01-Home or Self Care,  Pt is deceased and passed at [place] on 13-Jul-2015     Medication List    ASK your doctor about these medications        aspirin EC 81 MG tablet  Take 81 mg by mouth every morning.     carvedilol 80 MG 24 hr capsule  Commonly known as:  COREG CR  Take 80 mg by mouth daily with breakfast.     desonide 0.05 % lotion  Commonly known as:  DESOWEN  Apply to face and neck twice daily for rash     doxycycline 100 MG tablet  Commonly known as:  VIBRA-TABS  Take 1 tablet (100 mg total) by mouth 2  (two) times daily.     fluticasone 50 MCG/ACT nasal spray  Commonly known as:  FLONASE  2 puffs in each nostril at bedtime.     furosemide 80 MG tablet  Commonly known as:  LASIX  Take 80 mg by mouth daily with breakfast.     gabapentin 300 MG capsule  Commonly known as:  NEURONTIN  Take 3 capsules (900 mg total) by mouth 2 (two) times daily. Take 3 capsules in the morning, and  3 capsules at bedtime.     glipiZIDE 10 MG tablet  Commonly known as:  GLUCOTROL  Take 10 mg by mouth daily before breakfast.     JENTADUETO 2.03-999 MG Tabs  Generic drug:  Linagliptin-Metformin HCl  Take 1 tablet by mouth 2 (two) times daily.     lidocaine-prilocaine cream  Commonly known as:  EMLA  Apply a quarter size amount to port site 1 hour prior to chemo. Do not rub in. Cover with plastic.     magnesium oxide 400 MG tablet  Commonly known as:  MAG-OX  Take one tablet twice daily     ondansetron 8 MG tablet  Commonly known as:  ZOFRAN  Take 1 tablet (8 mg total) by mouth every 8 (eight) hours as needed for nausea or vomiting.     oxyCODONE-acetaminophen 5-325 MG per tablet  Commonly known as:  PERCOCET/ROXICET  Take one  to two tablets every 4 hours as needed for pain     polyethylene glycol powder powder  Commonly known as:  GLYCOLAX  Take in 8 oz fluid once daily, may increase to twice daily     potassium chloride 20 MEQ packet  Commonly known as:  KLOR-CON  Take 20 mEq by mouth daily.     ramipril 10 MG capsule  Commonly known as:  ALTACE  Take 1 capsule (10 mg total) by mouth daily.     senna 8.6 MG tablet  Commonly known as:  SENOKOT  Take 1 tablet by mouth 2 (two) times daily as needed for constipation.     valsartan 320 MG tablet  Commonly known as:  DIOVAN  Take 320 mg by mouth daily as needed (High Blood Pressure).         SignedJani Gravel 07-22-2015, 3:56 AM

## 2015-07-11 NOTE — Progress Notes (Signed)
[place] Progress Note Patient Name: Monica Allen DOB: 1956-05-02 MRN: [id number]   Date of Service  07-23-15  HPI/Events of Note  Worsening shock Made partial code earlier this evening Has seizure activity after cardiac arrest Prognosis dismal  eICU Interventions  Will add saline bolus and vasopressin gtt, but suggest that primary service discuss code status again with family> would move towards comfort measures as there is little hope for survival     Intervention Category Major Interventions: Shock - evaluation and management  Deyona Soza 23-Jul-2015, 1:44 AM

## 2015-07-11 NOTE — Progress Notes (Signed)
Patient sustained a witnessed respiratory arrest due to emesis. She was transferred to the intensive care unit intubated but nonresponsive. The patient and family were aware of the newly diagnosed carcinomatosis. Her prognosis is poor. Family states that should she go into cardiac arrest, no CPR should be performed. They do not want any further surgery performed. They do want to try to maximize medications only.

## 2015-07-11 NOTE — Progress Notes (Addendum)
Mrs. Shaun, Zuccaro passed away at [place]. There were no pulse, no respiration rate, no any signs of life.The death was witnessed and verified by two RNs, Kathe Becton and Gershon Cull. Family was at the bedside. MD was notified. [facility] was called.The report was received by Daine Gip . Referral number was received: [id number].

## 2015-07-11 NOTE — Progress Notes (Signed)
Pt extubated per family request and placed on 2lpm cann hr 130 rr18 spo2 89%

## 2015-07-11 NOTE — Progress Notes (Signed)
Patient's family decided to withdraw the care. MD was notified and arrived to discuss it with the family. Patient is currently DNR and ready to be extubated. All current medications are stopped.

## 2015-07-11 DEATH — deceased

## 2015-10-24 ENCOUNTER — Other Ambulatory Visit: Payer: Self-pay | Admitting: Nurse Practitioner

## 2016-09-02 IMAGING — DX DG ABDOMEN ACUTE W/ 1V CHEST
4 series · 4 of 4 positions shown · non-contrast
Comparison: April 30, 2015.

CLINICAL DATA: Acute generalized abdominal pain.

EXAM:
DG ABDOMEN ACUTE W/ 1V CHEST

[chest pa]
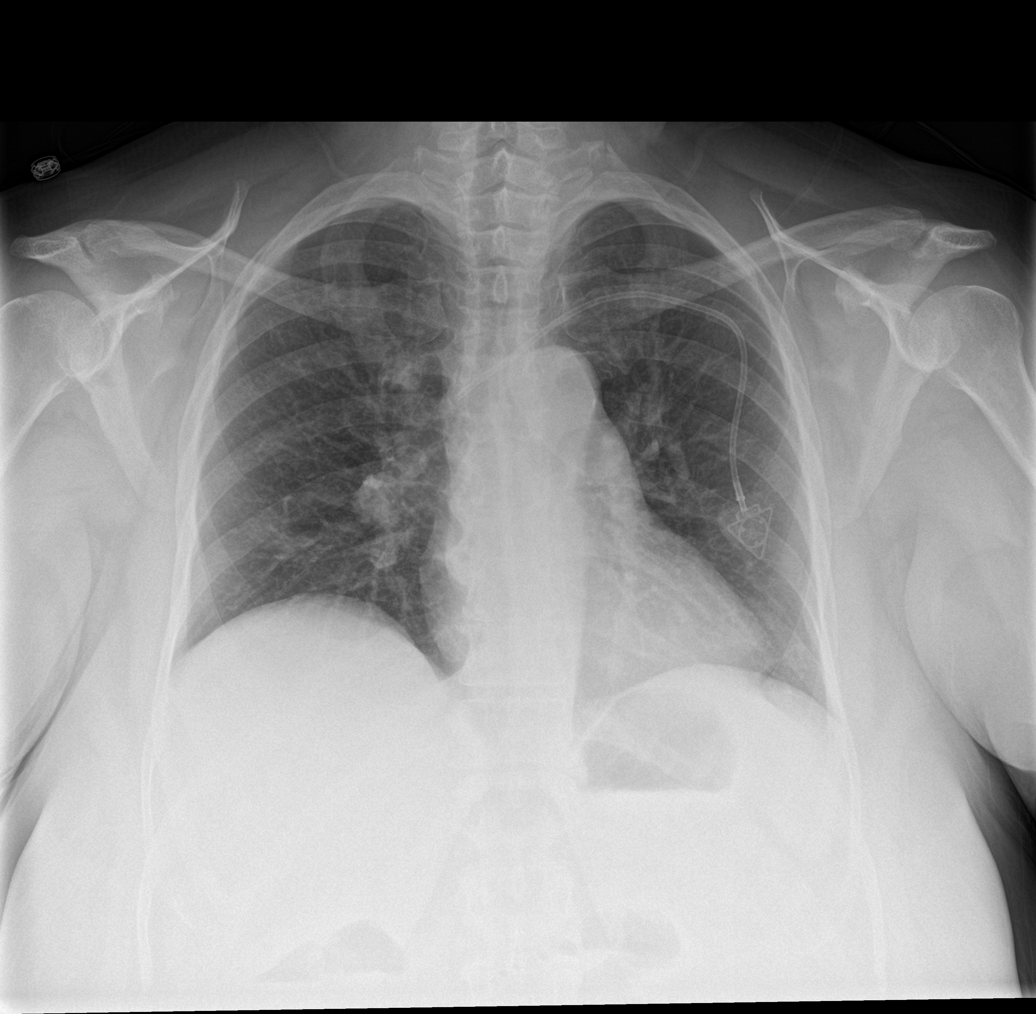

[abdomen supine (1 of 2)]
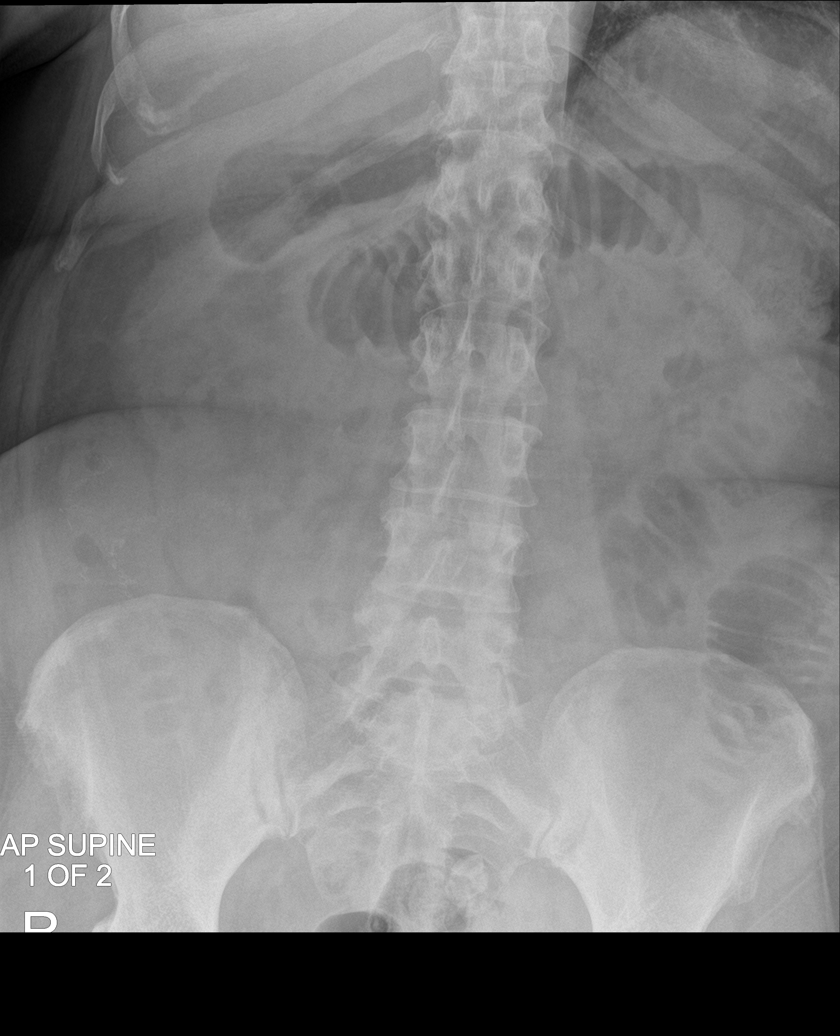

[abdomen erect]
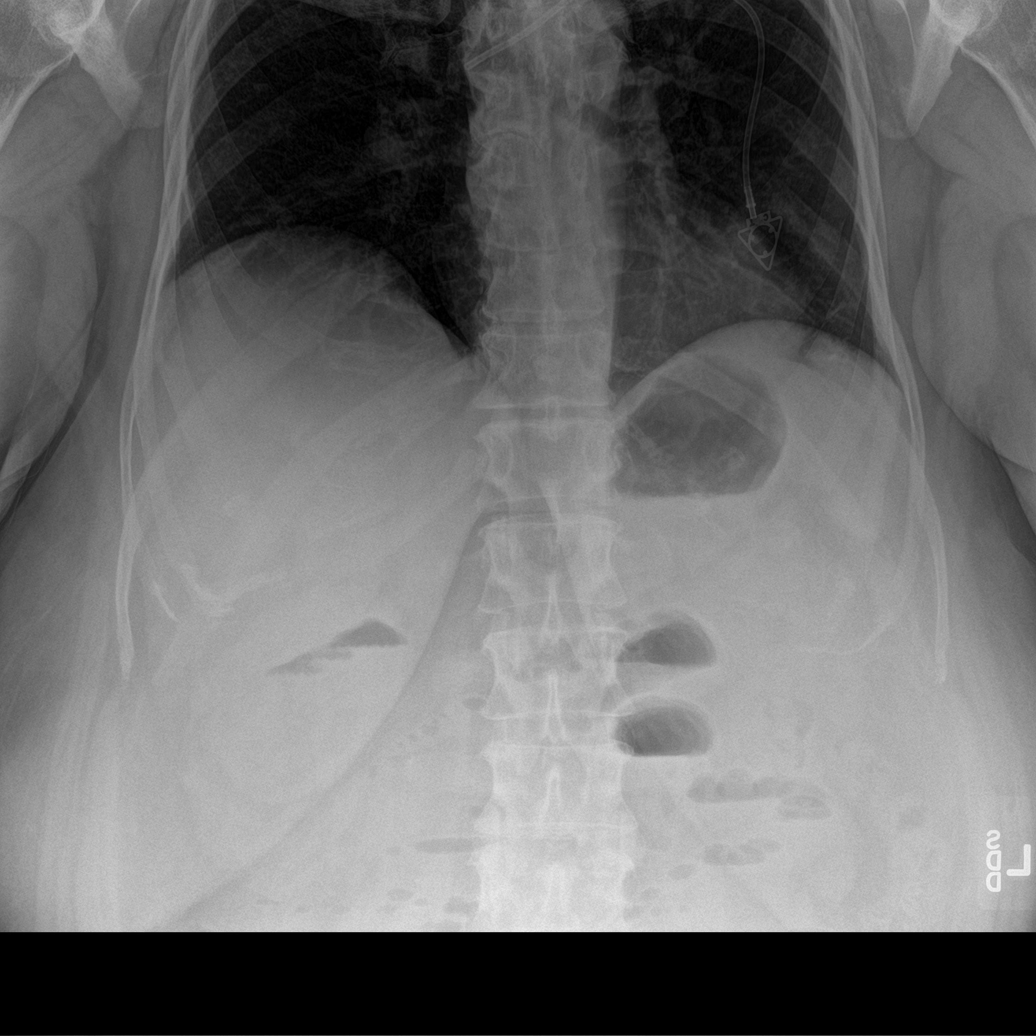

[abdomen supine (2 of 2)]
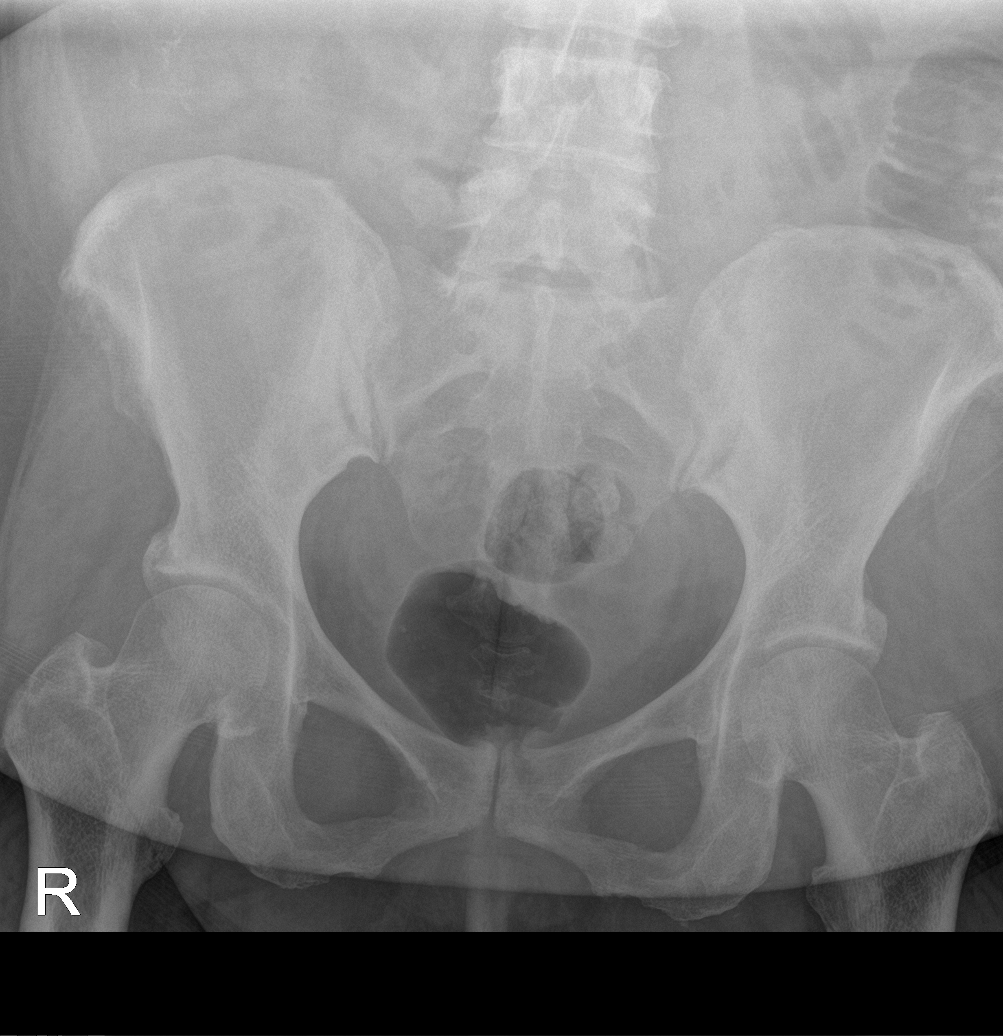

[4 of 4 positions shown; findings below may reference images not displayed]

FINDINGS: There is no evidence of free intraperitoneal air. Mildly dilated
small bowel loops are noted in the left and epigastric regions. No
colonic dilatation is noted. No radiopaque calculi or other
significant radiographic abnormality is seen. Heart size and
mediastinal contours are within normal limits. Both lungs are clear.
IMPRESSION: Mildly dilated small bowel loops are noted concerning for ileus or
possibly distal small bowel obstruction. Continued radiographic
follow-up is recommended.

## 2016-09-02 IMAGING — CT CT ABD-PELV W/ CM
2 of 5 series · 16 of 46 positions shown, 18 images · IV contrast (Omnipaque 300)
Comparison: CT chest abdomen and pelvis 06/17/2015, and earlier

CLINICAL DATA: 59-year-old female with lower abdominal pain and
vomiting for 3 days. Current history of stage IV colon cancer
currently undergoing chemotherapy. Subsequent encounter.

EXAM:
CT ABDOMEN AND PELVIS WITH CONTRAST
TECHNIQUE: Multidetector CT imaging of the abdomen and pelvis was performed
using the standard protocol following bolus administration of
intravenous contrast.
CONTRAST:  25mL OMNIPAQUE IOHEXOL 300 MG/ML SOLN, 100mL OMNIPAQUE
IOHEXOL 300 MG/ML SOLN

[Series 2: abd_pel_with 5.0 b40s · axial · 0.95mm/px · z∈[-454,-38]mm · 13 of 95 slices shown, 15 images]
[im 6/95  soft-tissue]
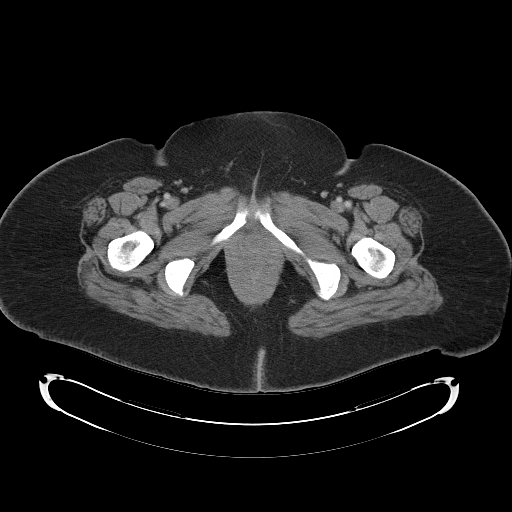
[im 6/95  bone]
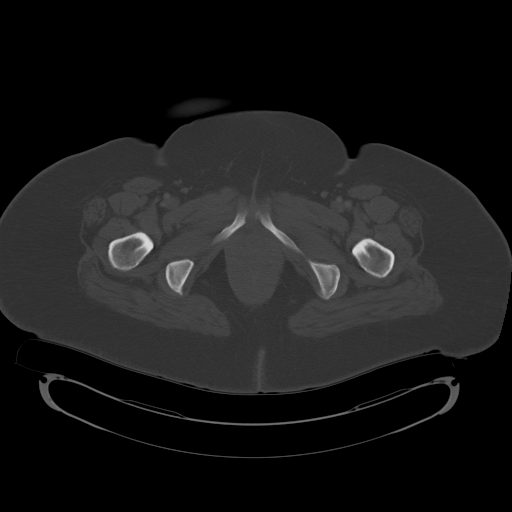
[im 12/95  soft-tissue]
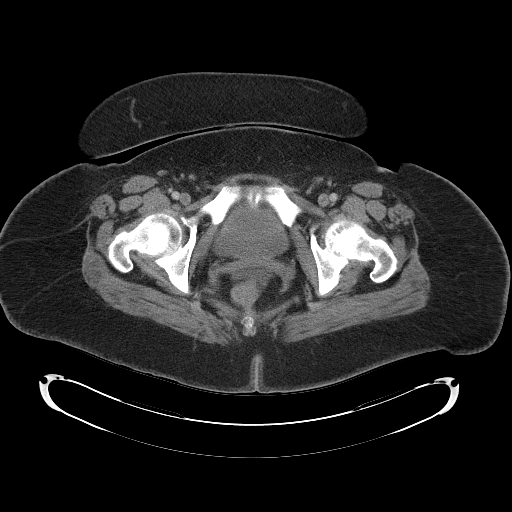
[im 23/95  soft-tissue]
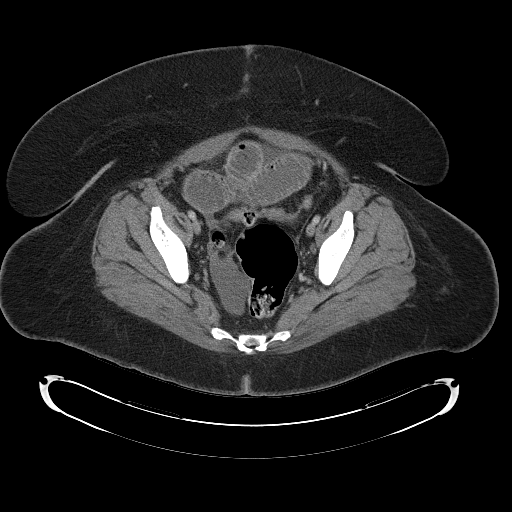
[im 28/95  soft-tissue]
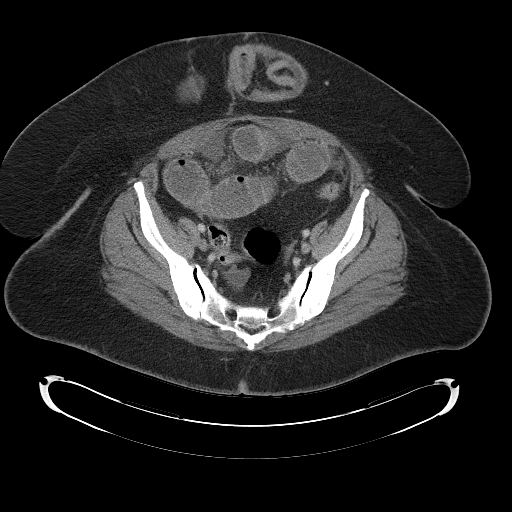
[im 34/95  soft-tissue]
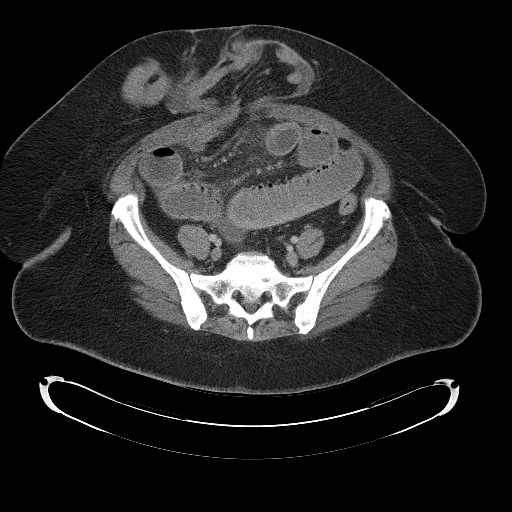
[im 39/95  soft-tissue]
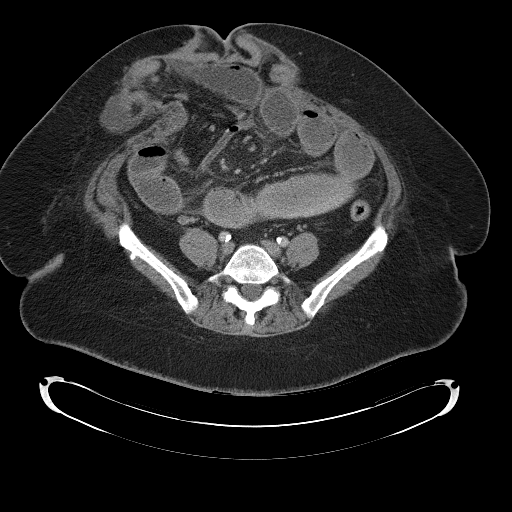
[im 50/95  soft-tissue]
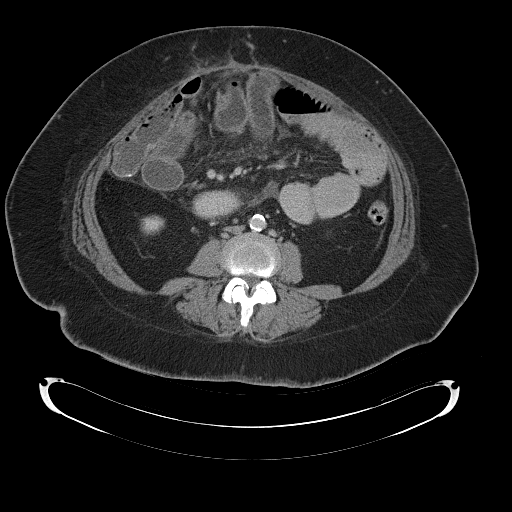
[im 56/95  soft-tissue]
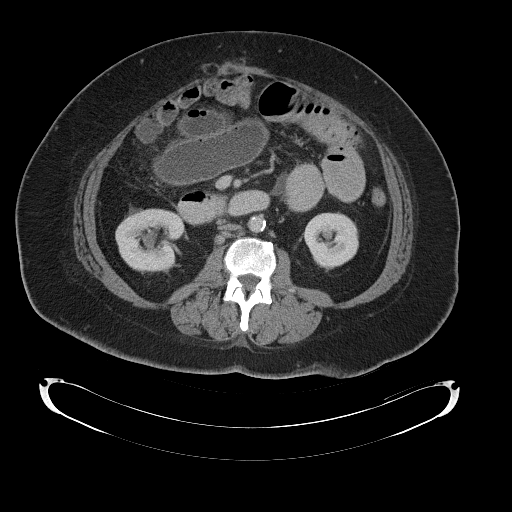
[im 61/95  soft-tissue]
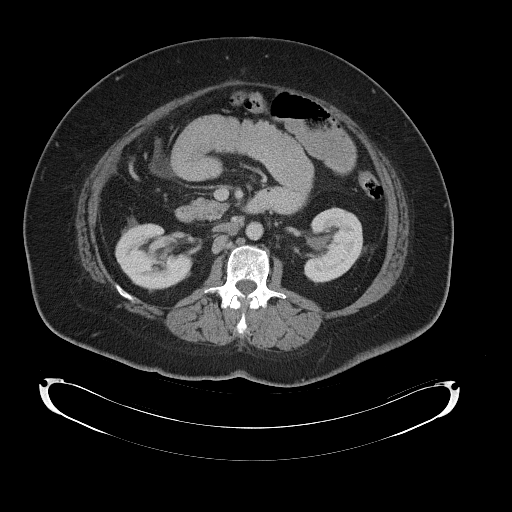
[im 61/95  bone]
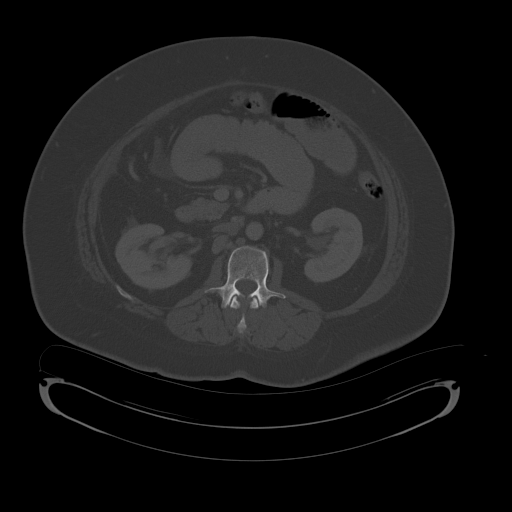
[im 67/95  soft-tissue]
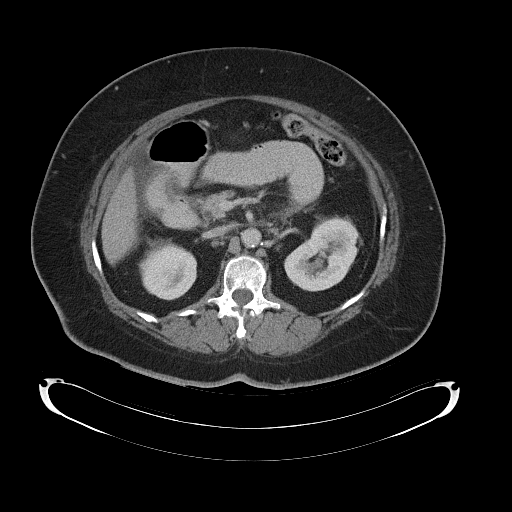
[im 72/95  soft-tissue]
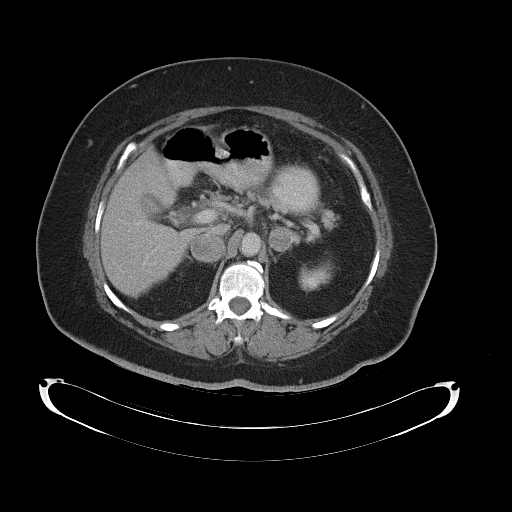
[im 83/95  soft-tissue]
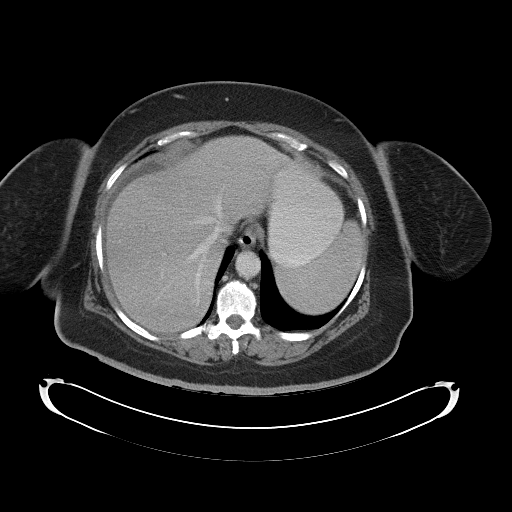
[im 89/95  soft-tissue]
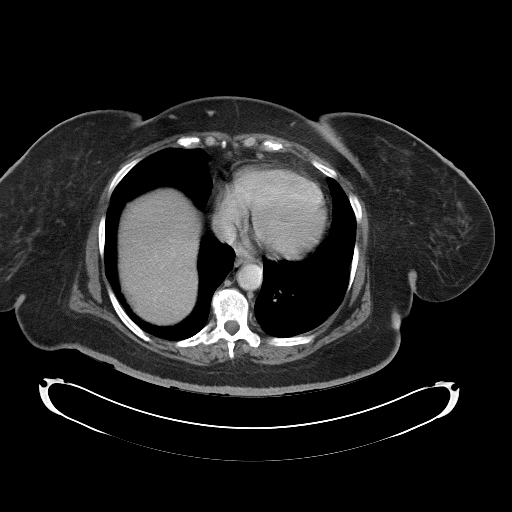

[Series 3: mpr cor post contrast 3.0mm · coronal · 0.99mm/px · 3 of 123 slices shown]
[im 41/123  soft-tissue]
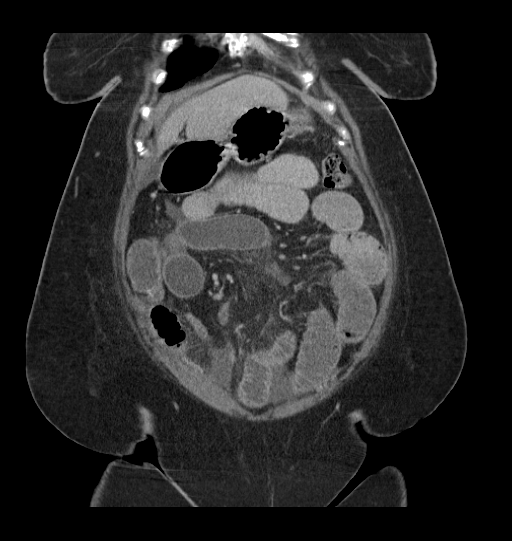
[im 55/123  soft-tissue]
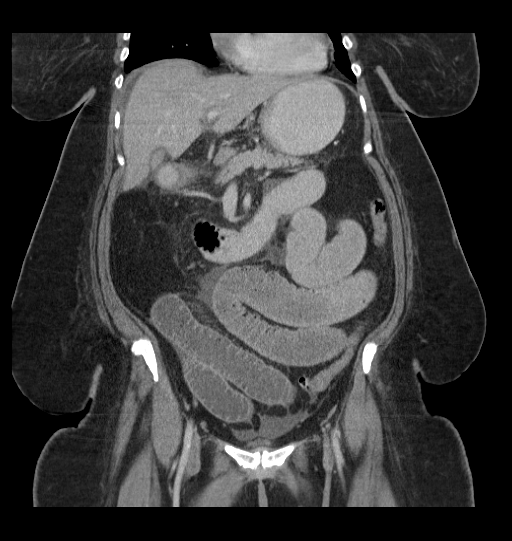
[im 68/123  soft-tissue]
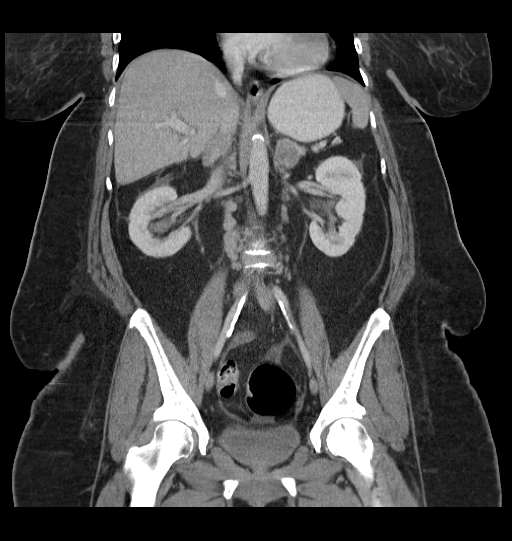

[16 of 46 positions shown; findings below may reference images not displayed]

FINDINGS: Stable and negative lung bases. No pericardial or pleural effusion.
Recently seen right lung pulmonary metastasis not included today.

No acute or suspicious osseous lesion identified.

Increased volume of pelvic free fluid, still overall small. Left
perirectal soft tissue nodularity re- demonstrated (series 2, image
79). Diminutive urinary bladder. Gas and stool in the distal colon.
Decompressed left colon and transverse colon. Small to large bowel
anastomosis in the right upper quadrant.

The small bowel now is dilated throughout the abdomen and pelvis.
Proximal and mid small bowel loops dilated up to 3.5 cm diameter.
There is a transition point where the distal small bowel loops inter
the right paracentral complex ventral abdominal hernia on series 2,
image 51. Small bowel within the complex appearing hernia then is
decompressed, and small bowel leaving the hernia and tracking toward
the anastomosis also appears decompressed. Continued small volume of
free fluid in the hernia sac. No definite fat stranding within the
herniated mesenteric. There is mesenteric stranding and interloop
fluid within the abdomen.

No pneumoperitoneum. Small volume of perihepatic free fluid is
increased. No liver mass. Decompressed gallbladder with probable
vicarious contrast excretion. Spleen and pancreas remain normal.
Bilateral adrenal metastases are stable measuring 2.7-3.3 cm
diameter. Portal venous system is patent. Aortoiliac calcified
atherosclerosis noted. Major arterial structures in the abdomen and
pelvis are patent. No lymphadenopathy identified. Renal enhancement
and contrast excretion appears symmetric and within normal limits.
IMPRESSION: 1. Acute small bowel obstruction, and transition point appears to be
the complex ventral abdominal hernia. Relatively high-grade of
obstruction with mesenteric stranding and interloop fluid, but no
perforation or free air.
2. Mildly increased ascites since the recent restaging study on
06/17/2015.
3. Adrenal metastases and perirectal peritoneal nodularity are
stable.
